# Patient Record
Sex: Female | Born: 1935
Health system: Southern US, Community
[De-identification: ages and names within clinical notes are randomized; demographics above are authoritative.]

## PROBLEM LIST (undated history)

## (undated) DIAGNOSIS — F419 Anxiety disorder, unspecified: Secondary | ICD-10-CM

## (undated) DIAGNOSIS — J449 Chronic obstructive pulmonary disease, unspecified: Secondary | ICD-10-CM

## (undated) DIAGNOSIS — R0902 Hypoxemia: Secondary | ICD-10-CM

## (undated) DIAGNOSIS — E785 Hyperlipidemia, unspecified: Secondary | ICD-10-CM

## (undated) DIAGNOSIS — M1990 Unspecified osteoarthritis, unspecified site: Secondary | ICD-10-CM

## (undated) DIAGNOSIS — I10 Essential (primary) hypertension: Secondary | ICD-10-CM

## (undated) DIAGNOSIS — T7840XA Allergy, unspecified, initial encounter: Secondary | ICD-10-CM

## (undated) HISTORY — DX: Unspecified osteoarthritis, unspecified site: M19.90

## (undated) HISTORY — DX: Anxiety disorder, unspecified: F41.9

## (undated) HISTORY — PX: SPINE SURGERY: SHX786

## (undated) HISTORY — DX: Hypoxemia: R09.02

## (undated) HISTORY — DX: Allergy, unspecified, initial encounter: T78.40XA

## (undated) HISTORY — DX: Hyperlipidemia, unspecified: E78.5

## (undated) HISTORY — DX: Essential (primary) hypertension: I10

## (undated) HISTORY — DX: Chronic obstructive pulmonary disease, unspecified: J44.9

## (undated) HISTORY — PX: ABDOMINAL HYSTERECTOMY: SHX81

---

## 2005-03-12 ENCOUNTER — Ambulatory Visit: Payer: Self-pay | Admitting: Internal Medicine

## 2005-03-23 ENCOUNTER — Ambulatory Visit: Payer: Self-pay | Admitting: Gastroenterology

## 2005-03-25 ENCOUNTER — Ambulatory Visit: Payer: Self-pay | Admitting: Internal Medicine

## 2005-03-30 ENCOUNTER — Ambulatory Visit: Payer: Self-pay | Admitting: Gastroenterology

## 2006-04-09 ENCOUNTER — Ambulatory Visit: Payer: Self-pay | Admitting: Podiatry

## 2006-07-02 ENCOUNTER — Ambulatory Visit: Payer: Self-pay | Admitting: Internal Medicine

## 2006-07-05 ENCOUNTER — Ambulatory Visit: Payer: Self-pay | Admitting: Internal Medicine

## 2006-07-08 ENCOUNTER — Ambulatory Visit: Payer: Self-pay | Admitting: Internal Medicine

## 2007-04-18 ENCOUNTER — Ambulatory Visit: Payer: Self-pay | Admitting: Pain Medicine

## 2007-04-28 ENCOUNTER — Ambulatory Visit: Payer: Self-pay | Admitting: Pain Medicine

## 2007-05-16 ENCOUNTER — Ambulatory Visit: Payer: Self-pay | Admitting: Physician Assistant

## 2007-07-12 ENCOUNTER — Ambulatory Visit: Payer: Self-pay | Admitting: Physician Assistant

## 2007-07-13 ENCOUNTER — Ambulatory Visit: Payer: Self-pay | Admitting: Internal Medicine

## 2007-09-26 ENCOUNTER — Ambulatory Visit: Payer: Self-pay | Admitting: Pain Medicine

## 2007-10-27 ENCOUNTER — Ambulatory Visit: Payer: Self-pay | Admitting: Physician Assistant

## 2007-11-24 ENCOUNTER — Ambulatory Visit: Payer: Self-pay | Admitting: Physician Assistant

## 2007-12-05 ENCOUNTER — Ambulatory Visit: Payer: Self-pay | Admitting: Physician Assistant

## 2008-01-18 ENCOUNTER — Ambulatory Visit: Payer: Self-pay | Admitting: Physician Assistant

## 2008-02-21 ENCOUNTER — Ambulatory Visit: Payer: Self-pay | Admitting: Physician Assistant

## 2008-03-21 ENCOUNTER — Ambulatory Visit: Payer: Self-pay | Admitting: Physician Assistant

## 2008-04-19 ENCOUNTER — Ambulatory Visit: Payer: Self-pay | Admitting: Physician Assistant

## 2008-07-18 ENCOUNTER — Ambulatory Visit: Payer: Self-pay | Admitting: Physician Assistant

## 2008-10-03 ENCOUNTER — Ambulatory Visit: Payer: Self-pay | Admitting: Physician Assistant

## 2008-10-11 ENCOUNTER — Ambulatory Visit: Payer: Self-pay | Admitting: Internal Medicine

## 2008-10-23 ENCOUNTER — Ambulatory Visit: Payer: Self-pay | Admitting: Pain Medicine

## 2008-11-08 ENCOUNTER — Ambulatory Visit: Payer: Self-pay | Admitting: Physician Assistant

## 2008-11-13 ENCOUNTER — Ambulatory Visit: Payer: Self-pay | Admitting: Pain Medicine

## 2008-11-27 ENCOUNTER — Ambulatory Visit: Payer: Self-pay | Admitting: Pain Medicine

## 2008-12-13 ENCOUNTER — Ambulatory Visit: Payer: Self-pay | Admitting: Physician Assistant

## 2009-03-14 ENCOUNTER — Ambulatory Visit: Payer: Self-pay | Admitting: Physician Assistant

## 2009-04-23 ENCOUNTER — Ambulatory Visit: Payer: Self-pay | Admitting: Ophthalmology

## 2009-05-07 ENCOUNTER — Ambulatory Visit: Payer: Self-pay | Admitting: Ophthalmology

## 2009-06-06 ENCOUNTER — Ambulatory Visit: Payer: Self-pay | Admitting: Physician Assistant

## 2009-09-09 ENCOUNTER — Ambulatory Visit: Payer: Self-pay | Admitting: Pain Medicine

## 2009-10-23 ENCOUNTER — Ambulatory Visit: Payer: Self-pay | Admitting: Internal Medicine

## 2010-10-27 ENCOUNTER — Ambulatory Visit: Payer: Self-pay | Admitting: Internal Medicine

## 2011-10-28 ENCOUNTER — Ambulatory Visit: Payer: Self-pay | Admitting: Internal Medicine

## 2012-10-28 ENCOUNTER — Ambulatory Visit: Payer: Self-pay | Admitting: Internal Medicine

## 2012-11-09 ENCOUNTER — Ambulatory Visit: Payer: Self-pay | Admitting: Gastroenterology

## 2012-11-11 LAB — PATHOLOGY REPORT

## 2012-11-27 ENCOUNTER — Ambulatory Visit: Payer: Self-pay | Admitting: Family Medicine

## 2012-11-27 LAB — URINALYSIS, COMPLETE
Bilirubin,UR: NEGATIVE
Ketone: NEGATIVE
Ph: 6.5 (ref 4.5–8.0)
Protein: 100
RBC,UR: >30 /HPF (ref 0–5)
Specific Gravity: 1.01 (ref 1.003–1.030)
Squamous Epithelial: NONE SEEN

## 2012-11-29 LAB — URINE CULTURE

## 2013-04-25 ENCOUNTER — Ambulatory Visit: Payer: Self-pay | Admitting: Vascular Surgery

## 2013-04-25 LAB — BASIC METABOLIC PANEL
Anion Gap: 5 — ABNORMAL LOW (ref 7–16)
BUN: 15 mg/dL (ref 7–18)
Co2: 29 mmol/L (ref 21–32)
Creatinine: 1.38 mg/dL — ABNORMAL HIGH (ref 0.60–1.30)
EGFR (Non-African Amer.): 37 — ABNORMAL LOW
Glucose: 81 mg/dL (ref 65–99)
Osmolality: 283 (ref 275–301)
Potassium: 4.7 mmol/L (ref 3.5–5.1)

## 2013-11-13 DIAGNOSIS — I10 Essential (primary) hypertension: Secondary | ICD-10-CM | POA: Insufficient documentation

## 2013-11-13 DIAGNOSIS — J449 Chronic obstructive pulmonary disease, unspecified: Secondary | ICD-10-CM | POA: Insufficient documentation

## 2013-11-13 DIAGNOSIS — E785 Hyperlipidemia, unspecified: Secondary | ICD-10-CM | POA: Insufficient documentation

## 2013-11-30 ENCOUNTER — Ambulatory Visit: Payer: Self-pay | Admitting: Internal Medicine

## 2013-12-01 ENCOUNTER — Ambulatory Visit: Payer: Self-pay | Admitting: Internal Medicine

## 2014-01-13 ENCOUNTER — Ambulatory Visit: Payer: Self-pay | Admitting: Physician Assistant

## 2014-04-28 LAB — BASIC METABOLIC PANEL
Anion Gap: 7 (ref 7–16)
BUN: 12 mg/dL (ref 7–18)
CALCIUM: 8.6 mg/dL (ref 8.5–10.1)
CHLORIDE: 107 mmol/L (ref 98–107)
CO2: 26 mmol/L (ref 21–32)
Creatinine: 1 mg/dL (ref 0.60–1.30)
EGFR (African American): >60
EGFR (Non-African Amer.): 57 — ABNORMAL LOW
Glucose: 93 mg/dL (ref 65–99)
OSMOLALITY: 279 (ref 275–301)
Potassium: 5.5 mmol/L — ABNORMAL HIGH (ref 3.5–5.1)
Sodium: 140 mmol/L (ref 136–145)

## 2014-04-28 LAB — CBC
HCT: 41.2 % (ref 35.0–47.0)
HGB: 13.4 g/dL (ref 12.0–16.0)
MCH: 31.9 pg (ref 26.0–34.0)
MCHC: 32.5 g/dL (ref 32.0–36.0)
MCV: 98 fL (ref 80–100)
Platelet: 203 10*3/uL (ref 150–440)
RBC: 4.2 10*6/uL (ref 3.80–5.20)
RDW: 13.8 % (ref 11.5–14.5)
WBC: 7 10*3/uL (ref 3.6–11.0)

## 2014-04-28 LAB — TROPONIN I

## 2014-04-29 ENCOUNTER — Inpatient Hospital Stay: Payer: Self-pay | Admitting: Internal Medicine

## 2014-04-30 LAB — BASIC METABOLIC PANEL
Anion Gap: 9 (ref 7–16)
BUN: 14 mg/dL (ref 7–18)
Calcium, Total: 8.3 mg/dL — ABNORMAL LOW (ref 8.5–10.1)
Chloride: 108 mmol/L — ABNORMAL HIGH (ref 98–107)
Co2: 26 mmol/L (ref 21–32)
Creatinine: 0.99 mg/dL (ref 0.60–1.30)
EGFR (African American): >60
EGFR (Non-African Amer.): 58 — ABNORMAL LOW
Glucose: 88 mg/dL (ref 65–99)
Osmolality: 285 (ref 275–301)
Potassium: 3.6 mmol/L (ref 3.5–5.1)
Sodium: 143 mmol/L (ref 136–145)

## 2014-04-30 LAB — TSH: Thyroid Stimulating Horm: 0.432 u[IU]/mL — ABNORMAL LOW

## 2014-05-01 LAB — CBC WITH DIFFERENTIAL/PLATELET
BASOS PCT: 0.4 %
Basophil #: 0 10*3/uL (ref 0.0–0.1)
EOS ABS: 0 10*3/uL (ref 0.0–0.7)
Eosinophil %: 0 %
HCT: 40.3 % (ref 35.0–47.0)
HGB: 13.2 g/dL (ref 12.0–16.0)
LYMPHS ABS: 1.6 10*3/uL (ref 1.0–3.6)
LYMPHS PCT: 11.9 %
MCH: 31.4 pg (ref 26.0–34.0)
MCHC: 32.8 g/dL (ref 32.0–36.0)
MCV: 96 fL (ref 80–100)
MONOS PCT: 4.2 %
Monocyte #: 0.6 x10 3/mm (ref 0.2–0.9)
NEUTROS PCT: 83.5 %
Neutrophil #: 11.2 10*3/uL — ABNORMAL HIGH (ref 1.4–6.5)
Platelet: 191 10*3/uL (ref 150–440)
RBC: 4.22 10*6/uL (ref 3.80–5.20)
RDW: 13.5 % (ref 11.5–14.5)
WBC: 13.4 10*3/uL — ABNORMAL HIGH (ref 3.6–11.0)

## 2014-05-01 LAB — BASIC METABOLIC PANEL
Anion Gap: 7 (ref 7–16)
BUN: 15 mg/dL (ref 7–18)
CHLORIDE: 108 mmol/L — AB (ref 98–107)
Calcium, Total: 9 mg/dL (ref 8.5–10.1)
Co2: 25 mmol/L (ref 21–32)
Creatinine: 1 mg/dL (ref 0.60–1.30)
GFR CALC NON AF AMER: 57 — AB
Glucose: 102 mg/dL — ABNORMAL HIGH (ref 65–99)
Osmolality: 280 (ref 275–301)
POTASSIUM: 4.2 mmol/L (ref 3.5–5.1)
Sodium: 140 mmol/L (ref 136–145)

## 2014-07-01 DIAGNOSIS — J9620 Acute and chronic respiratory failure, unspecified whether with hypoxia or hypercapnia: Secondary | ICD-10-CM | POA: Diagnosis not present

## 2014-07-01 DIAGNOSIS — J449 Chronic obstructive pulmonary disease, unspecified: Secondary | ICD-10-CM | POA: Diagnosis not present

## 2014-07-01 DIAGNOSIS — I82409 Acute embolism and thrombosis of unspecified deep veins of unspecified lower extremity: Secondary | ICD-10-CM | POA: Diagnosis not present

## 2014-07-01 DIAGNOSIS — I10 Essential (primary) hypertension: Secondary | ICD-10-CM | POA: Diagnosis not present

## 2014-07-27 DIAGNOSIS — M5416 Radiculopathy, lumbar region: Secondary | ICD-10-CM | POA: Diagnosis not present

## 2014-07-27 DIAGNOSIS — M5136 Other intervertebral disc degeneration, lumbar region: Secondary | ICD-10-CM | POA: Diagnosis not present

## 2014-08-01 DIAGNOSIS — J449 Chronic obstructive pulmonary disease, unspecified: Secondary | ICD-10-CM | POA: Diagnosis not present

## 2014-08-01 DIAGNOSIS — I10 Essential (primary) hypertension: Secondary | ICD-10-CM | POA: Diagnosis not present

## 2014-08-01 DIAGNOSIS — J9620 Acute and chronic respiratory failure, unspecified whether with hypoxia or hypercapnia: Secondary | ICD-10-CM | POA: Diagnosis not present

## 2014-08-01 DIAGNOSIS — I82409 Acute embolism and thrombosis of unspecified deep veins of unspecified lower extremity: Secondary | ICD-10-CM | POA: Diagnosis not present

## 2014-08-06 ENCOUNTER — Emergency Department: Payer: Self-pay | Admitting: Emergency Medicine

## 2014-08-06 DIAGNOSIS — Z79899 Other long term (current) drug therapy: Secondary | ICD-10-CM | POA: Diagnosis not present

## 2014-08-06 DIAGNOSIS — Z7982 Long term (current) use of aspirin: Secondary | ICD-10-CM | POA: Diagnosis not present

## 2014-08-06 DIAGNOSIS — J441 Chronic obstructive pulmonary disease with (acute) exacerbation: Secondary | ICD-10-CM | POA: Diagnosis not present

## 2014-08-06 DIAGNOSIS — N390 Urinary tract infection, site not specified: Secondary | ICD-10-CM | POA: Diagnosis not present

## 2014-08-06 DIAGNOSIS — R0602 Shortness of breath: Secondary | ICD-10-CM | POA: Diagnosis not present

## 2014-08-06 DIAGNOSIS — I10 Essential (primary) hypertension: Secondary | ICD-10-CM | POA: Diagnosis not present

## 2014-08-06 DIAGNOSIS — Z720 Tobacco use: Secondary | ICD-10-CM | POA: Diagnosis not present

## 2014-08-06 DIAGNOSIS — F17200 Nicotine dependence, unspecified, uncomplicated: Secondary | ICD-10-CM | POA: Diagnosis not present

## 2014-08-06 DIAGNOSIS — Z79891 Long term (current) use of opiate analgesic: Secondary | ICD-10-CM | POA: Diagnosis not present

## 2014-08-06 DIAGNOSIS — Z7951 Long term (current) use of inhaled steroids: Secondary | ICD-10-CM | POA: Diagnosis not present

## 2014-08-07 ENCOUNTER — Inpatient Hospital Stay: Payer: Self-pay | Admitting: Internal Medicine

## 2014-08-07 DIAGNOSIS — J9620 Acute and chronic respiratory failure, unspecified whether with hypoxia or hypercapnia: Secondary | ICD-10-CM | POA: Diagnosis not present

## 2014-08-07 DIAGNOSIS — R05 Cough: Secondary | ICD-10-CM | POA: Diagnosis not present

## 2014-08-07 DIAGNOSIS — J209 Acute bronchitis, unspecified: Secondary | ICD-10-CM | POA: Diagnosis not present

## 2014-08-07 DIAGNOSIS — Z9981 Dependence on supplemental oxygen: Secondary | ICD-10-CM | POA: Diagnosis not present

## 2014-08-07 DIAGNOSIS — Z7982 Long term (current) use of aspirin: Secondary | ICD-10-CM | POA: Diagnosis not present

## 2014-08-07 DIAGNOSIS — M5136 Other intervertebral disc degeneration, lumbar region: Secondary | ICD-10-CM | POA: Diagnosis not present

## 2014-08-07 DIAGNOSIS — J9600 Acute respiratory failure, unspecified whether with hypoxia or hypercapnia: Secondary | ICD-10-CM | POA: Diagnosis not present

## 2014-08-07 DIAGNOSIS — R531 Weakness: Secondary | ICD-10-CM | POA: Diagnosis not present

## 2014-08-07 DIAGNOSIS — J90 Pleural effusion, not elsewhere classified: Secondary | ICD-10-CM | POA: Diagnosis not present

## 2014-08-07 DIAGNOSIS — F332 Major depressive disorder, recurrent severe without psychotic features: Secondary | ICD-10-CM | POA: Diagnosis not present

## 2014-08-07 DIAGNOSIS — J449 Chronic obstructive pulmonary disease, unspecified: Secondary | ICD-10-CM | POA: Diagnosis not present

## 2014-08-07 DIAGNOSIS — J441 Chronic obstructive pulmonary disease with (acute) exacerbation: Secondary | ICD-10-CM | POA: Diagnosis not present

## 2014-08-07 DIAGNOSIS — J9610 Chronic respiratory failure, unspecified whether with hypoxia or hypercapnia: Secondary | ICD-10-CM | POA: Diagnosis not present

## 2014-08-07 DIAGNOSIS — B370 Candidal stomatitis: Secondary | ICD-10-CM | POA: Diagnosis not present

## 2014-08-07 DIAGNOSIS — I10 Essential (primary) hypertension: Secondary | ICD-10-CM | POA: Diagnosis not present

## 2014-08-07 DIAGNOSIS — F331 Major depressive disorder, recurrent, moderate: Secondary | ICD-10-CM | POA: Diagnosis not present

## 2014-08-07 DIAGNOSIS — Z7951 Long term (current) use of inhaled steroids: Secondary | ICD-10-CM | POA: Diagnosis not present

## 2014-08-07 DIAGNOSIS — R0602 Shortness of breath: Secondary | ICD-10-CM | POA: Diagnosis not present

## 2014-08-07 DIAGNOSIS — F419 Anxiety disorder, unspecified: Secondary | ICD-10-CM | POA: Diagnosis not present

## 2014-08-07 DIAGNOSIS — I739 Peripheral vascular disease, unspecified: Secondary | ICD-10-CM | POA: Diagnosis not present

## 2014-08-07 DIAGNOSIS — M6281 Muscle weakness (generalized): Secondary | ICD-10-CM | POA: Diagnosis not present

## 2014-08-14 ENCOUNTER — Encounter
Admit: 2014-08-14 | Disposition: A | Discharge status: Home / Self Care | Payer: Self-pay | Attending: Internal Medicine | Admitting: Internal Medicine

## 2014-08-17 DIAGNOSIS — R270 Ataxia, unspecified: Secondary | ICD-10-CM | POA: Diagnosis not present

## 2014-08-17 DIAGNOSIS — J449 Chronic obstructive pulmonary disease, unspecified: Secondary | ICD-10-CM | POA: Diagnosis not present

## 2014-08-17 DIAGNOSIS — Z9981 Dependence on supplemental oxygen: Secondary | ICD-10-CM | POA: Diagnosis not present

## 2014-08-17 DIAGNOSIS — R0602 Shortness of breath: Secondary | ICD-10-CM | POA: Diagnosis not present

## 2014-08-17 DIAGNOSIS — R531 Weakness: Secondary | ICD-10-CM | POA: Diagnosis not present

## 2014-08-17 DIAGNOSIS — M6281 Muscle weakness (generalized): Secondary | ICD-10-CM | POA: Diagnosis not present

## 2014-08-17 DIAGNOSIS — J441 Chronic obstructive pulmonary disease with (acute) exacerbation: Secondary | ICD-10-CM | POA: Diagnosis not present

## 2014-08-17 DIAGNOSIS — J9600 Acute respiratory failure, unspecified whether with hypoxia or hypercapnia: Secondary | ICD-10-CM | POA: Diagnosis not present

## 2014-08-17 DIAGNOSIS — N186 End stage renal disease: Secondary | ICD-10-CM | POA: Diagnosis not present

## 2014-08-17 DIAGNOSIS — F419 Anxiety disorder, unspecified: Secondary | ICD-10-CM | POA: Diagnosis not present

## 2014-08-17 DIAGNOSIS — I10 Essential (primary) hypertension: Secondary | ICD-10-CM | POA: Diagnosis not present

## 2014-08-17 DIAGNOSIS — M5136 Other intervertebral disc degeneration, lumbar region: Secondary | ICD-10-CM | POA: Diagnosis not present

## 2014-08-17 DIAGNOSIS — J9620 Acute and chronic respiratory failure, unspecified whether with hypoxia or hypercapnia: Secondary | ICD-10-CM | POA: Diagnosis not present

## 2014-08-17 DIAGNOSIS — J9610 Chronic respiratory failure, unspecified whether with hypoxia or hypercapnia: Secondary | ICD-10-CM | POA: Diagnosis not present

## 2014-08-17 DIAGNOSIS — B370 Candidal stomatitis: Secondary | ICD-10-CM | POA: Diagnosis not present

## 2014-08-17 DIAGNOSIS — I82409 Acute embolism and thrombosis of unspecified deep veins of unspecified lower extremity: Secondary | ICD-10-CM | POA: Diagnosis not present

## 2014-08-17 DIAGNOSIS — I739 Peripheral vascular disease, unspecified: Secondary | ICD-10-CM | POA: Diagnosis not present

## 2014-08-17 DIAGNOSIS — R339 Retention of urine, unspecified: Secondary | ICD-10-CM | POA: Diagnosis not present

## 2014-08-20 DIAGNOSIS — J441 Chronic obstructive pulmonary disease with (acute) exacerbation: Secondary | ICD-10-CM | POA: Diagnosis not present

## 2014-08-20 DIAGNOSIS — R270 Ataxia, unspecified: Secondary | ICD-10-CM | POA: Diagnosis not present

## 2014-08-20 DIAGNOSIS — N186 End stage renal disease: Secondary | ICD-10-CM | POA: Diagnosis not present

## 2014-08-20 DIAGNOSIS — J449 Chronic obstructive pulmonary disease, unspecified: Secondary | ICD-10-CM | POA: Diagnosis not present

## 2014-08-20 DIAGNOSIS — R339 Retention of urine, unspecified: Secondary | ICD-10-CM | POA: Diagnosis not present

## 2014-08-20 DIAGNOSIS — I10 Essential (primary) hypertension: Secondary | ICD-10-CM | POA: Diagnosis not present

## 2014-09-07 ENCOUNTER — Encounter
Admit: 2014-09-07 | Disposition: A | Discharge status: Home / Self Care | Payer: Self-pay | Attending: Internal Medicine | Admitting: Internal Medicine

## 2014-09-08 LAB — URINALYSIS, COMPLETE
Bacteria: NONE SEEN
Bilirubin,UR: NEGATIVE
Blood: NEGATIVE
Glucose,UR: NEGATIVE mg/dL (ref 0–75)
NITRITE: NEGATIVE
Ph: 6 (ref 4.5–8.0)
Specific Gravity: 1.026 (ref 1.003–1.030)
Squamous Epithelial: 1
WBC UR: 1108 /HPF (ref 0–5)

## 2014-09-11 LAB — URINE CULTURE

## 2014-09-25 DIAGNOSIS — I10 Essential (primary) hypertension: Secondary | ICD-10-CM | POA: Diagnosis not present

## 2014-09-25 DIAGNOSIS — Z09 Encounter for follow-up examination after completed treatment for conditions other than malignant neoplasm: Secondary | ICD-10-CM | POA: Diagnosis not present

## 2014-09-25 DIAGNOSIS — M5442 Lumbago with sciatica, left side: Secondary | ICD-10-CM | POA: Diagnosis not present

## 2014-09-25 DIAGNOSIS — F419 Anxiety disorder, unspecified: Secondary | ICD-10-CM | POA: Insufficient documentation

## 2014-09-25 DIAGNOSIS — J449 Chronic obstructive pulmonary disease, unspecified: Secondary | ICD-10-CM | POA: Diagnosis not present

## 2014-09-27 DIAGNOSIS — Z87891 Personal history of nicotine dependence: Secondary | ICD-10-CM | POA: Diagnosis not present

## 2014-09-27 DIAGNOSIS — Z9981 Dependence on supplemental oxygen: Secondary | ICD-10-CM | POA: Diagnosis not present

## 2014-09-27 DIAGNOSIS — M5136 Other intervertebral disc degeneration, lumbar region: Secondary | ICD-10-CM | POA: Diagnosis not present

## 2014-09-27 DIAGNOSIS — J441 Chronic obstructive pulmonary disease with (acute) exacerbation: Secondary | ICD-10-CM | POA: Diagnosis not present

## 2014-09-27 DIAGNOSIS — I739 Peripheral vascular disease, unspecified: Secondary | ICD-10-CM | POA: Diagnosis not present

## 2014-09-27 DIAGNOSIS — I10 Essential (primary) hypertension: Secondary | ICD-10-CM | POA: Diagnosis not present

## 2014-09-27 DIAGNOSIS — M4726 Other spondylosis with radiculopathy, lumbar region: Secondary | ICD-10-CM | POA: Diagnosis not present

## 2014-09-28 NOTE — Op Note (Signed)
PATIENT NAME:  Bethany [name], Bethany [name] MR#:  [id number] DATE OF BIRTH:  June 03, 1936  DATE OF PROCEDURE:  04/25/2013  PREOPERATIVE DIAGNOSIS: Atherosclerotic occlusive disease bilateral lower extremities, with rest pain bilateral lower extremities.   POSTOPERATIVE DIAGNOSIS: Atherosclerotic occlusive disease bilateral lower extremities, with rest pain bilateral lower extremities.   PROCEDURES PERFORMED: 1.  Abdominal aortogram.  2.  Pelvic angiography, second-order catheter placement.  3.  Percutaneous transluminal angioplasty of the left external iliac artery to 6 mm.  4.  Percutaneous transluminal angioplasty and stent placement of the left common iliac artery to 7 mm.  5.  Percutaneous transluminal angioplasty and stent placement of the right external iliac artery.   PROCEDURE PERFORMED BY: Renford Dills, MD   SEDATION: Precedex drip with IV fentanyl supplementation. Continuous ECG, pulse oximetry and cardiopulmonary monitoring was performed throughout the entire procedure by the interventional radiology nurse. Total sedation time was 2 hours.   ACCESS: 6-French sheath, right common femoral artery.   FLUOROSCOPY TIME: 12.7 minutes.   CONTRAST USED: 98 mL Isovue.   INDICATIONS: Ms. Paro is a 79 year old woman who presents to the office with increasing pain and inability to walk within her own home secondary to the pain in her legs. The risks and benefits for angiography and intervention were reviewed. All questions were answered. The patient has agreed to proceed.   DESCRIPTION OF PROCEDURE: The patient is taken to special procedures and placed in a supine position. After adequate sedation is achieved, both groins are prepped and draped in sterile fashion. Ultrasound is placed in a sterile sleeve. Ultrasound is utilized secondary to lack of appropriate landmarks and to avoid vascular injury. Under direct ultrasound visualization, common femoral artery is identified. It is echolucent and  pulsatile, indicating patency. In its mid and distal portions, there is a heavy burden of plaque, particularly in the posterior and medial walls, and this does appear to create a significant luminal narrowing. However, more proximally, there does appear to be an area with minimal posterior plaque, which is echolucent and pulsatile, indicating patency. This is the area selected for access. Lidocaine 1% is infiltrated, and a micropuncture needle is inserted, microwire followed by micro sheath. I was unable to get the J-wire to advance into the aorta under fluoroscopy, and so hand injection of contrast through the micro sheath demonstrated a greater than 80% narrowing of the external iliac artery on the right. Ultimately, using this angiography, a floppy Glidewire is negotiated into the common iliac and subsequently the aorta. A 5-French sheath and 5-French pigtail catheter are then advanced. The pigtail catheter is positioned at the level of T12, and AP projection of the aorta is obtained. The pigtail catheter is repositioned to above the bifurcation, and bilateral oblique views of the pelvis are obtained. Given the findings of the high-grade stenosis on the right external iliac, as well as an occlusion of the common iliac on the left and diffuse greater than 70% narrowing of the external iliac on the left, 4000 units of heparin was given. RIM catheter was advanced through the sheath, and the floppy Glidewire and RIM catheter were used to cross the aortic bifurcation. The catheter was advanced down into the distal external iliac and common femoral, and hand injection of contrast was utilized to demonstrate the common femoral artery on the left as well as the femoral bifurcation. A Magic Torque wire was then advanced down into the profunda femoris to allow for better purchase, and an Ansel sheath was advanced up and over  the bifurcation and positioned with its tip approximately 2 cm into the left common iliac artery.    A 6 x 4 balloon was then advanced over the wire, and beginning at the level of the circumflex vessels, serial inflations were performed to 10 atmospheres for 30 seconds. As the balloon was advanced into the distal common iliac artery, greater inflation was performed to higher atmospheres and longer time. Followup imaging demonstrated the external iliac looked quite good, but the common iliac still had a dissected plane with inadequate result and therefore, an 8 x 60 LifeStar stent was deployed. It was postdilated with a 7 balloon, and this was performed within the common iliac on the left. Followup angiography in 2 views demonstrates excellent result with rapid flow of contrast now into the common femoral on the left. Distal outflow is unchanged.   The sheath is then pulled into the right iliac system. Initially, a view of the common iliac demonstrated haziness. The 7 x 4 balloon was used to inflate in this area, but there was never any waist. I do not believe this is an angioplasty and have not identified it as such.   The external iliac is treated with a 6 balloon. This results in a significant dissection and subsequently, a 7 x 40 LifeStar stent is deployed from just below the iliac bifurcation, extending distally in the external. It is posted with a 7 balloon. However, there remained significant haziness at the bifurcation itself, and a 7 x 19 Omnilink stent is deployed, extending the stent approximately 2 mm above the bifurcation of the external and internal iliacs. The 7 balloon is used to re-dilate the midportion of the LifeStar stent as well. Followup angiography done now demonstrates an excellent result with rapid flow of contrast through the right external iliac system and no significant residual stenosis.   Oblique view of the right groin is obtained, and a StarClose device is deployed with excellent result. There are no immediate complications.   INTERPRETATION: The initial views demonstrate  high-grade stricture in the external iliac. Once this has been negotiated, the abdominal aorta is opacified. It demonstrates diffuse disease, but there are no hemodynamically significant lesions noted in the aorta. On the left, there is occlusion of the distal common iliac and diffuse disease throughout the external iliac. There is external iliac disease on the right as noted above. Bilateral common femoral arteries demonstrate diffuse disease, although in imaging, there does not appear to be a circumferential high-grade stricture. It should be noted that the duplex ultrasound did demonstrate velocity shifts in both common femorals. The profunda femoris arteries are patent bilaterally, as are the visualized portions of the SFA, although there is heavy disease within the SFA.   Following angioplasty on the left of the external and subsequently angioplasty and stenting of the common, there is resolution of the stenoses on the left. Following angioplasty and stent placement on the right, there is resolution of this lesion as well.   SUMMARY: Successful recanalization of the aortoiliac system bilaterally in a patient who continues to have significant disease throughout their lower extremities bilaterally, including common femoral and superficial femoral disease. The patient will follow up with me in the office to obtain ABIs and ascertain how much improvement she has had from this procedure thus far, and further treatment options will be based on this result.  ____________________________ Renford Dills, MD ggs:jcm D: 04/25/2013 17:25:15 ET T: 04/25/2013 18:07:53 ET JOB#: [id number]  cc: Renford Dills, MD, <Dictator>  Barbette ReichmannVishwanath Hande, MD Renford DillsGREGORY G SCHNIER MD ELECTRONICALLY SIGNED 05/15/2013 17:16

## 2014-09-29 NOTE — H&P (Signed)
PATIENT NAME:  Bethany [name], Bethany [name] MR#:  [id number] DATE OF BIRTH:  November 16, 1935  DATE OF ADMISSION:  04/29/2014  REFERRING PHYSICIAN: Lurena Joiner L. Shaune Pollack, MD  PRIMARY CARE PHYSICIAN: Barbette Reichmann, MD   ADMISSION DIAGNOSIS: Chronic obstructive pulmonary disease exacerbation.   HISTORY OF PRESENT ILLNESS: This is a 79 year old Caucasian female who presents to the Emergency Department with cough for 3 days and a tight feeling in her chest. The patient states that she had some difficulty taking deep breaths, but denies shortness of breath, lightheadedness, or dizziness. She denies fevers, but admits to some general fatigue. In the Emergency Department, the patient was found to be hypoxic, but without infiltrates on her chest x-ray, and a clinical picture consistent with COPD exacerbation. The Emergency Department staff was trying to discharge the patient home, but found that her oxygen saturations were persistently around 85%, which prompted them to call for admission.   REVIEW OF SYSTEMS:  CONSTITUTIONAL: The patient denies fever, but admits to fatigue.  EYES: Denies blurred vision or inflammation.  EARS, NOSE, AND THROAT: Denies tinnitus or difficulty swallowing.  RESPIRATORY: Admits to cough and some shortness of breath with exercise.  CARDIOVASCULAR: Denies chest pain, palpitations, orthopnea, or paroxysmal nocturnal dyspnea.  GASTROINTESTINAL: Denies nausea, vomiting, abdominal pain, or diarrhea.  GENITOURINARY: Denies dysuria, increased frequency, or hesitancy of urination.  ENDOCRINE: Denies polyuria or polydipsia.  HEMATOLOGIC AND LYMPHATIC: Admits to easy bruising, but denies bleeding.  INTEGUMENTARY: Denies rashes or lesions.  MUSCULOSKELETAL: Denies myalgias or arthralgias.  NEUROLOGIC: Denies numbness in her extremities or dysarthria.  PSYCHIATRIC: Denies depression or suicidal ideation.   PAST MEDICAL HISTORY: COPD, hypertension, chronic back pain, and peripheral vascular disease.    PAST SURGICAL HISTORY: The patient has had 8 back surgeries, a hysterectomy, and iliac and aortic stent placement.   SOCIAL HISTORY: The patient still smokes cigarettes sometimes. She has been trying to quit. She denies alcohol or illicit drug use. The patient lives alone and is very active.   FAMILY HISTORY: The patient's father is deceased of ALS. Coronary artery disease is also in  some of the men in her family.   MEDICATIONS:  1.  Acetaminophen with hydrocodone 325 mg/5 mg tablet 1 tab p.o. every 6 hours as needed for pain.  2.  Advair 100 mcg/50 mcg inhaled powder 1 inhalation 2 times a day.  3.  Albuterol 90 mcg/inhalation 2 puffs every 4-6 hours as needed for coughing, wheezing, or shortness of breath.  4.  Amlodipine 5 mg 1 tablet p.o. daily.  5.  Azithromycin 250 mg tablets 1 tab p.o. x 4 days.  6.  Gabapentin 300 mg 1 capsule p.o. t.i.d.  7.  Lisinopril 20 mg 1 tab p.o. daily.  8.  Lovastatin 20 mg 1 tab p.o. at bedtime.  9.  Magnesium oxide 500 mg 1 tab p.o. daily.  10.  Metoprolol succinate extended release 100 mg 1 tablet p.o. daily.  11.  Prednisone 20 mg 2 tablets p.o. x 5 days.   ALLERGIES: SULFA DRUGS.   PERTINENT LABORATORY RESULTS AND RADIOGRAPHIC FINDINGS: Serum glucose is 93, BUN 12, creatinine is 1, serum sodium 140, potassium is 5.5, chloride is 107, bicarbonate is 26, calcium is 8.6. Troponin is negative. White blood cell count is 7, hemoglobin 13.4, hematocrit 41.2. Chest x-ray shows chronic obstructive pulmonary disease, aortic atherosclerosis, and chronic-appearing focal kyphosis near the thoracolumbar junction. There are no acute cardiopulmonary findings.   PHYSICAL EXAMINATION:  VITAL SIGNS: Temperature is 98, pulse 72, respirations 20,  blood pressure is 157/77, pulse oximetry was 85% off of oxygen and increased to 96% on 2 liters of oxygen via nasal cannula.  GENERAL: The patient is alert and oriented x 3, in no apparent distress.  HEENT: Normocephalic,  atraumatic. Pupils equal, round, and reactive to light and accommodation. Extraocular movements are intact. Mucous membranes are moist.  NECK: Trachea is midline. No adenopathy.  CHEST: Symmetric, atraumatic.  CARDIOVASCULAR: Regular rate and rhythm. Normal S1, S2. No rubs, clicks, or murmurs appreciated.  LUNGS: Faint wheezing in upper lung fields. There are no rales or rhonchi. The patient has no use of accessory muscles.  ABDOMEN: Positive bowel sounds. Soft, nontender, nondistended. No hepatosplenomegaly.  GENITOURINARY: Deferred.  MUSCULOSKELETAL: The patient moves all 4 extremities equally. There is 5/5 strength in upper and lower extremities bilaterally.  SKIN: No rashes or lesions.  EXTREMITIES: No clubbing, cyanosis, or edema.  NEUROLOGIC: Cranial nerves II through XII are grossly intact.  PSYCHIATRIC: Mood is normal. Affect is congruent.   ASSESSMENT AND PLAN: This is a 79 year old female admitted for chronic obstructive pulmonary disease exacerbation.  1.  Acute on chronic respiratory failure with hypoxemia secondary to chronic obstructive pulmonary disease. The patient's oxygen saturations on room air prior to discharge were 85%, which prompted admission. The patient is still coughing some, but much more comfortable. I have increased her Advair dose and I have added Spiriva her inhaler regimen. We will continue albuterol while the patient is awake and complete a course of azithromycin. I have lengthened  the patient's steroid taper and she will get supplemental humidified oxygen via nasal cannula to maintain oxygen saturations between 88% and 92%.  2.  Hypertension, currently uncontrolled, but likely elevated due to cough and overall stress. Continue metoprolol, lisinopril, and amlodipine per home regimen. Monitor for improvement. We may need to increase the patient's angiotensin-converting enzyme inhibitor or calcium channel blocker.  3.  Peripheral vascular disease. I will start the  patient on aspirin. Her vascular surgeon had discontinued Plavix due to easy bruising.  4.  Chronic back pain. We will continue the patient's oral narcotics per her home regimen.  5.  Tobacco abuse. The patient smokes very little. I have explained to her that smoking cessation halts the deterioration of chronic obstructive pulmonary disease. While in the hospital, I will start the patient on a low-dose Nicoderm patch.  6.  Deep vein thrombosis prophylaxis: Heparin.  7.  Gastrointestinal prophylaxis: Unnecessary as the patient is not critically ill.  8.  The patient is a FULL CODE.  TIME SPENT ON ADMISSION ORDERS AND PATIENT CARE: Approximately 40 minutes.    ____________________________ Kelton PillarMichael S. Sheryle Hailiamond, MD msd:ts D: 04/29/2014 01:59:00 ET T: 04/29/2014 02:11:39 ET JOB#: [id number]  cc: Kelton PillarMichael S. Sheryle Hailiamond, MD, <Dictator> Kelton PillarMICHAEL S Yailine Ballard MD ELECTRONICALLY SIGNED 05/11/2014 1:26

## 2014-09-29 NOTE — Discharge Summary (Signed)
PATIENT NAME:  Bethany [name] MR#:  [id number] DATE OF BIRTH:  07-25-1935  DATE OF ADMISSION:  04/29/2014 DATE OF DISCHARGE:  05/01/2014  DIAGNOSES AT TIME OF DISCHARGE:  1.  Acute on chronic respiratory failure secondary to chronic obstructive pulmonary disease exacerbation.  2.  Hypertension.  3.  Peripheral arterial disease.   4.  Chronic tobacco use.  5.  Chronic back pain.  6.  Hyperlipidemia.   CHIEF COMPLAINT:  Shortness of breath, fatigue, hypoxemic.   HISTORY OF PRESENT ILLNESS: Bethany [name] is a 79 year old female who presented the ED complaining of cough with productive sputum associated with tightness in her chest, difficulty in taking deep breaths, and also has been experiencing a generalized sense of fatigue. The patient was noted to be hypoxemic with O2 saturation of 85% on admission.   PAST MEDICAL HISTORY: Significant for COPD, hypertension, chronic back pain, PAD, please see H and P for other details. The patient [facility].   LABORATORY DATA: Chest x-ray showed evidence of aortic atherosclerosis and focal kyphosis near the thoracolumbar junction, no cardiopulmonary findings.  Sugar was 93, BUN 12, creatinine 1, serum sodium 140, potassium 5.5, chloride 107, bicarbonate 26. WBC count 7, hemoglobin 13.4.   HOSPITAL COURSE: The [facility] was [facility] and received nebulized bronchodilator therapy as well as intravenous steroids. She made good progress. She was placed on supplemental oxygen 2 liters nasal cannula and she was also started on azithromycin that was continued as an outpatient. She was ambulated and discharged in stable condition on the following medications.    DISCHARGE MEDICATIONS: Azithromycin 250 mg p.o. daily for 4 days, prednisone taper 30 mg a day for 3 days, decreased x 10 mg every 3 days until gone, albuterol inhaler 2 puffs q.i.d. p.r.n., Spiriva capsule once a day, Advair Diskus 250/50 one puff b.i.d., amlodipine 5 mg once a day, hydrocodone  acetaminophen 325 mg q. 6 p.r.n.,  gabapentin 300 mg t.i.d., lisinopril 20 mg once a day, metoprolol 100 mg once a day, lovastatin 20 mg once a day, and oxygen 2 liters nasal cannula. The patient was advised a low-sodium diet and advised to follow with me, Dr. Marcello FennelHande, in 1-2 weeks time.   TOTAL TIME SPENT IN DISCHARGING THE PATIENT:  35 minutes.    ____________________________ Barbette ReichmannVishwanath Jacari Iannello, MD vh:bu D: 05/04/2014 13:23:59 ET T: 05/04/2014 14:42:37 ET JOB#: [id number]  cc: Barbette ReichmannVishwanath Luzelena Heeg, MD, <Dictator> Barbette ReichmannVISHWANATH Jusitn Salsgiver MD ELECTRONICALLY SIGNED 05/08/2014 17:49

## 2014-09-30 DIAGNOSIS — I82409 Acute embolism and thrombosis of unspecified deep veins of unspecified lower extremity: Secondary | ICD-10-CM | POA: Diagnosis not present

## 2014-09-30 DIAGNOSIS — J9620 Acute and chronic respiratory failure, unspecified whether with hypoxia or hypercapnia: Secondary | ICD-10-CM | POA: Diagnosis not present

## 2014-09-30 DIAGNOSIS — J449 Chronic obstructive pulmonary disease, unspecified: Secondary | ICD-10-CM | POA: Diagnosis not present

## 2014-09-30 DIAGNOSIS — I10 Essential (primary) hypertension: Secondary | ICD-10-CM | POA: Diagnosis not present

## 2014-10-02 DIAGNOSIS — M5136 Other intervertebral disc degeneration, lumbar region: Secondary | ICD-10-CM | POA: Diagnosis not present

## 2014-10-02 DIAGNOSIS — Z9981 Dependence on supplemental oxygen: Secondary | ICD-10-CM | POA: Diagnosis not present

## 2014-10-02 DIAGNOSIS — I739 Peripheral vascular disease, unspecified: Secondary | ICD-10-CM | POA: Diagnosis not present

## 2014-10-02 DIAGNOSIS — M4726 Other spondylosis with radiculopathy, lumbar region: Secondary | ICD-10-CM | POA: Diagnosis not present

## 2014-10-02 DIAGNOSIS — I10 Essential (primary) hypertension: Secondary | ICD-10-CM | POA: Diagnosis not present

## 2014-10-02 DIAGNOSIS — J441 Chronic obstructive pulmonary disease with (acute) exacerbation: Secondary | ICD-10-CM | POA: Diagnosis not present

## 2014-10-02 DIAGNOSIS — Z87891 Personal history of nicotine dependence: Secondary | ICD-10-CM | POA: Diagnosis not present

## 2014-10-04 DIAGNOSIS — I10 Essential (primary) hypertension: Secondary | ICD-10-CM | POA: Diagnosis not present

## 2014-10-04 DIAGNOSIS — M4726 Other spondylosis with radiculopathy, lumbar region: Secondary | ICD-10-CM | POA: Diagnosis not present

## 2014-10-04 DIAGNOSIS — I739 Peripheral vascular disease, unspecified: Secondary | ICD-10-CM | POA: Diagnosis not present

## 2014-10-04 DIAGNOSIS — Z87891 Personal history of nicotine dependence: Secondary | ICD-10-CM | POA: Diagnosis not present

## 2014-10-04 DIAGNOSIS — Z9981 Dependence on supplemental oxygen: Secondary | ICD-10-CM | POA: Diagnosis not present

## 2014-10-04 DIAGNOSIS — J441 Chronic obstructive pulmonary disease with (acute) exacerbation: Secondary | ICD-10-CM | POA: Diagnosis not present

## 2014-10-04 DIAGNOSIS — M5136 Other intervertebral disc degeneration, lumbar region: Secondary | ICD-10-CM | POA: Diagnosis not present

## 2014-10-07 NOTE — Consult Note (Signed)
Psychiatry: Follow-up for this patient who appears to be acutely depressed.  On interview today she tells me she is feeling much better.  She tells me she feels optimistic now.  She mentions that she intends to cooperate with physical therapy in order to regain her strength.  She tells me that [place] to [place]. new complaints.  Mood is better.  Physically feeling stronger although still not hungry. appears to be better groomed and more awake and alert.  Good eye contact.  Normal psychomotor activity.  Speech is more complete and easier to understand.  Affect more reactive and brighter.  No sign of delusions no suicidal ideation. appears to be doing much better.  Tolerating medication.  Slept well last night.  I will continue with the Lexapro and the Valium at night and avoid much other sedation in the day. Major depression recurrent moderate  Electronic Signatures: Clapacs, Jackquline DenmarkJohn T (MD)  (Signed on 09-Mar-16 18:12)  Authored  Last Updated: 09-Mar-16 18:12 by Audery Amellapacs, John T (MD)

## 2014-10-07 NOTE — H&P (Signed)
PATIENT NAME:  Bethany [name], Bethany [name] MR#:  [id number] DATE OF BIRTH:  Oct 16, 1935  DATE OF ADMISSION:  08/07/2014  REFERRING PHYSICIAN:  Sheryl L. Mindi JunkerGottlieb, MD   PRIMARY CARE PHYSICIAN:  Barbette ReichmannVishwanath Hande, [facility].   CHIEF COMPLAINT:  Shortness of breath.   HISTORY OF PRESENT ILLNESS:  This is a 79 year old Caucasian female with a history of COPD and chronic respiratory failure on 2 liters via nasal cannula at baseline, as well as hypertension, essential, presenting with shortness of breath described as 3-day duration of shortness of breath as well as dyspnea on exertion and associated cough productive of yellow sputum. Denies fevers or chills. She was actually [facility] and diagnosed with COPD exacerbation. She received steroids and was discharged, however, returns one day later with worsening symptoms. Denies any further symptomatology other than stated above. Upon arrival, she was started on BiPAP therapy given tachypnea and respiratory distress.   REVIEW OF SYSTEMS:   CONSTITUTIONAL:  Denies fevers, chills, fatigue, or weakness.  EYES:  Denies blurred vision, double vision, or eye pain. EARS, NOSE, AND THROAT:  Denies tinnitus, ear pain, or hearing loss. RESPIRATORY:  Positive for cough and shortness of breath as stated above. CARDIOVASCULAR:  Denies chest pain, palpitations, or edema.  GASTROINTESTINAL:  Denies nausea, vomiting, diarrhea, or abdominal pain. GENITOURINARY:  Denies dysuria or hematuria. ENDOCRINE:  Denies nocturia or thyroid problems. HEMATOLOGIC AND LYMPHATIC:  Denies easy bruising or bleeding. SKIN:  No rash or lesion. MUSCULOSKELETAL:  Denies pain in the neck, back, shoulders, knees, or hips or other arthritic symptoms. NEUROLOGIC:  Denies paralysis or paresthesias. PSYCHIATRIC:  Denies anxiety or depressive symptoms.   Otherwise, a full review of systems performed by me is negative.    PAST MEDICAL HISTORY:  Includes  COPD and chronic respiratory failure on 2 liters via nasal cannula at baseline, essential hypertension, and peripheral arterial disease.  SOCIAL HISTORY:  Rule out tobacco use. Denies any alcohol or drug use.   FAMILY HISTORY:  no known pulmonary or coronary artery disease.   ALLERGIES:  SULFA DRUGS.  HOME MEDICATIONS:  Include acetaminophen/hydrocodone 325/5 mg p.o. 5 times a day as needed for pain, aspirin 81 mg p.o. daily, lisinopril 20 mg p.o. daily, diazepam 5 mg p.o. at bedtime, gabapentin 300 mg p.o. 3 times daily, lovastatin 20 mg p.o. at bedtime, metoprolol 100 mg p.o. daily, Advair 250/50 mcg inhalation b.i.d., albuterol 90 mcg inhalation 4 puffs every 4 hours as needed for shortness of breath, Spiriva 18 mcg inhalation daily, Norvasc 5 mg p.o. daily.   PHYSICAL EXAMINATION: VITAL SIGNS:  Temperature 98.4, heart rate 104, respirations 24, blood pressure 140/75, saturating 94% on supplemental O2, weight 58.1 kg, BMI 20.7.  GENERAL:  Well-nourished, well-developed, Caucasian female currently in minimal distress given respiratory status.  HEAD:  Normocephalic, atraumatic.  EYES:  Pupils are equal, round, and reactive to light. Extraocular muscles are intact. No scleral icterus.  MOUTH:  Moist mucous membranes. Dentition is intact. No abscess noted.  EARS, NOSE, AND THROAT:  Clear without exudates. No external lesions.  NECK:  Supple. No thyromegaly. No nodules. No JVD.  PULMONARY:  Diffuse expiratory wheezing throughout all lung fields without rhonchi or rales. Tachypneic without use of accessory muscles. Good respiratory effort.  CHEST:  Nontender to palpation.  CARDIOVASCULAR:  S1 and S2, regular rate and rhythm. No murmurs, rubs, or gallops. No edema. Pedal pulses 2+ bilaterally.  GASTROINTESTINAL:  Soft, nontender, nondistended. No masses. Positive bowel sounds.  No hepatosplenomegaly.  MUSCULOSKELETAL:  No swelling, clubbing, or edema. Range of motion is full in all extremities.   NEUROLOGIC:  Cranial nerves II through XII are intact. No gross focal neurological deficits. Sensation is intact. Reflexes are intact.  SKIN:  No ulceration, lesions, rash, or cyanosis. Skin is warm and dry. Turgor is intact. PSYCHIATRIC:  Mood and affect are within normal limits. The patient is alert and oriented x 3. Insight and judgment are intact.   LABORATORY DATA:  ABG performed at 3 liters via nasal cannula showed pH of 7.35, CO2 of 44, O2 of 70, and bicarbonate of 24.3. Chest x-ray shows no acute cardiopulmonary process. Sodium is 139, potassium 4.3, chloride 107, bicarbonate 22, BUN 14, creatinine 0.99, glucose 117. Troponin is less than 0.02. WBC is 16.1, hemoglobin 12.9, platelets 229,000.   ASSESSMENT AND PLAN:  This is a 79 year old Caucasian female with a history of chronic obstructive pulmonary disease and chronic respiratory failure on 2 liters via nasal cannula at baseline, presenting with shortness of breath.   1.  Chronic obstructive pulmonary disease exacerbation. Provide supplemental O2 and keep SaO2 greater than 88%, DuoNeb treatments q. 4 hours, Solumedrol 60 mg IV daily, as well as azithromycin for its anti-inflammatory effects. Continue with her home dosage of Advair and Spiriva.  2.  Hypertension, essential. Continue with Norvasc and metoprolol.  3.  Vein thromboembolism prophylaxis with heparin subcutaneously.   CODE STATUS:  The patient is a full code.   TIME SPENT:  45 minutes.    ____________________________ Cletis Athens. Yasmene Salomone, MD dkh:nb D: 08/07/2014 22:39:44 ET T: 08/07/2014 23:09:05 ET JOB#: [id number]  cc: Cletis Athens. Raimundo Corbit, MD, <Dictator> Zael Shuman Synetta Shadow MD ELECTRONICALLY SIGNED 08/15/2014 20:44

## 2014-10-07 NOTE — Discharge Summary (Signed)
PATIENT NAME:  Bethany [name], Bethany [name] MR#:  [id number] DATE OF BIRTH:  [birth year]  DATE OF ADMISSION:  08/07/2014 DATE OF DISCHARGE:  08/17/2014  DISCHARGE DIAGNOSES: 1.  Acute on chronic respiratory failure secondary to chronic obstructive pulmonary disease exacerbation.  2.  Hypertension.  3.  Anxiety.   CHIEF COMPLAINT: Shortness of breath.   HISTORY OF PRESENT ILLNESS: Bethany SmilesShirley [name] is a 79 year old female with a history of COPD, chronic respiratory failure, on 2 liters nasal cannula, history of hypertension, presented to the ED complaining of shortness of breath associated with cough, productive yellow sputum. The patient recently had been treated for similar symptoms with steroids but returns with worsening symptoms.   Past medical history is significant for COPD, chronic respiratory failure, essential hypertension, peripheral vascular disease. She is an ex-smoker. Please see H and P for full details.   HOSPITAL COURSE: The patient was [facility] and received nebulized bronchodilator therapy as well as oxygen and IV antibiotics. ABG performed with 3 liters nasal cannula showed a pH of 7.35, pCO2 of 44, O2 of 70 and bicarb of 24. Chest x-ray did not show any acute cardiopulmonary disease. CT of the chest showed COPD changes with tiny nonspecific right lung nodules, about 3 mm in diameter. She was advised to have a repeat CT scan in 6 months. There was no evidence of infiltrate or pleural effusion or pneumothorax. There was evidence of previous caudal thoracic spine fracture, subluxation or effusion and degenerative disk disease in the upper lumbar spine. During her stay in the hospital, the patient continued to have episodes of anxiety and she was seen by psychiatrist, Dr. Toni Amendlapacs, and also started on Lexapro. In addition, she needed Xanax p.r.n. and also Valium at bedtime. Her breathing gradually improved and she was advised rehab and she was transferred in stable condition on the following  medications.  DISCHARGE MEDICATIONS: Gabapentin 300 mg p.o. t.i.d., ProAir HFA 2 puffs 4 times a day as needed, amlodipine 5 mg once a day, Advair Diskus 250/50 one puff b.i.d., lovastatin 20 mg once a day, metoprolol succinate 100 mg once a day, Spiriva 18 mcg 1 capsule once a day, aspirin 81 mg a day, diazepam 5 mg once a day, baclofen 10 mg p.o. t.i.d., albuterol nebs q. 2 to 4 hours p.r.n., lisinopril 40 mg once a day, prednisone taper starting at 40 mg a day for three days, decrease x 10 mg every three days until gone, hydrocodone/acetaminophen 5/325 one tablet p.o. b.i.d. p.r.n., Levaquin 250 mg p.o. daily for 7 days, escitalopram 10 mg once a day, nystatin oral suspension 5 mL q. 6 hours for 10 days, docusate sodium 100 mg b.i.d., guaifenesin oral liquid 5 mL q. 12 hours p.r.n. for cough.   DISCHARGE INSTRUCTIONS: The patient was advised home oxygen at 3 to 4 liters nasal cannula and wean down to 2 liters as tolerated. The patient has been advised to follow up with me, Dr. Marcello FennelHande, in 2 to 3 weeks' time.   Total time spen tin discharge of this patient: 35 minutes ____________________________ Barbette ReichmannVishwanath Quintina Hakeem, MD vh:sb D: 08/17/2014 13:03:46 ET T: 08/17/2014 13:13:53 ET JOB#: [id number]  cc: Barbette ReichmannVishwanath Kenzly Rogoff, MD, <Dictator> Barbette ReichmannVISHWANATH Lashan Macias MD ELECTRONICALLY SIGNED 09/03/2014 18:08

## 2014-10-07 NOTE — Consult Note (Signed)
PATIENT NAME:  Bethany [name], Bethany [name] MR#:  [id number] DATE OF BIRTH:  [birth year]  DATE OF CONSULTATION:  08/14/2014  CONSULTING PHYSICIAN:  Audery Amel, MD  IDENTIFYING INFORMATION AND REASON FOR CONSULTATION: This is a 79 year old woman who was admitted for a COPD exacerbation. Consult for anxiety.   HISTORY OF PRESENT ILLNESS: Information from the patient and the chart. The patient states that she feels like she does not know how she can go on in her current situation. When I asked her what the current situation was she said that it was having COPD.  She seems to feel like the COPD is a brand-new onset thing. In any case she is expressing that she feels much sicker now and like there has been a dramatic change in her health and her ability to do things since coming into the hospital. The patient says that she had been pretty much in her normal state of health until just a couple of weeks ago. Then quite quickly she had an increase in her shortness of breath that required hospitalization. Now she reports that her mood is feeling bad. She describes herself as being useless and having no hope for the future. She has no appetite. She still sleeps okay in the hospital. She no longer has much that she is looking forward to in the future. She says she is having thoughts about wishing that she could just give up and die although she does not think that she would ever try and kill herself. Her reason for doing this is just because she does not want to harm her brother. In addition to her illness, however, it is clear that a major stress for her is that her closest friend recently required hospitalization and now has been placed in an assisted living facility. She says that she used to care for this woman and visit with her every day. It sounds like her emotional life very much revolved around this friend and that the two of them had plans to vacation together in the future. Now that woman evidently is no longer living  nearby. The patient misses her terribly and feels like she does not really have anything to live for. She is not reporting any psychotic symptoms. The patient is not getting any psychiatric treatment other than a small dose of benzodiazepines at night for sleep.   PAST PSYCHIATRIC HISTORY:  At first she denied it, but then she recalled that she actually had tried to kill herself once in the past. She says that this was a long time ago, probably decades ago before the hospital was at its current location. She was psychiatrically hospitalized for about a week. After thinking for a minute she remembers that incident was triggered also by a close friend of hers having moved away.   SOCIAL HISTORY: The patient lives by herself. She has never married and has no children. Her closest relative is her brother, although she says that her brother's wife does not care for her very much. By far her closest emotional relationship sounds like it was with her good friend who lived across the street from her. The patient is a retired Airline pilot. Had been living independently before hospitalization.   PAST MEDICAL HISTORY: The patient has a long-standing diagnosis of COPD, although she does not seem to recognize it as being such. She was supposed to be using home oxygen before coming into the hospital, but clearly was not doing it as much as she was supposed to. She  was supposed to be using inhalers, but never thought that they were particularly important before.   SUBSTANCE ABUSE HISTORY:  She says that there was a time many years ago when she drank too much, but then she gave it up. She denies other drug abuse.   FAMILY HISTORY: History of two uncles who are alcoholics.   CURRENT MEDICATIONS: Norvasc 5 mg in the morning, aspirin 81 mg in the morning, baclofen 10 mg three times a day, Advair Diskus 1 puff twice a day, gabapentin 300 mg three times a day, lovastatin 20 mg at night, metoprolol 100 mg in the morning,  Spiriva HandiHaler 1 capsule daily, Docusate 100 mg twice a day, nitroglycerin ointment topically twice a day methylprednisolone still given IV right now. Zosyn antibiotic IV q. 8 hours, Levaquin 250 mg oral daily, lisinopril 40 mg daily.  She was also taking Xanax in the hospital three times a day as needed for anxiety.   ALLERGIES: SULFA DRUGS.   REVIEW OF SYSTEMS: Complains of feeling tired. No appetite. No motivation, no energy, feeling hopeless. She denies hallucinations.   MENTAL STATUS EXAMINATION:  A 79 year old woman who looks her stated age, cooperative with the interview. Good eye contact. Psychomotor activity slow. Speech slow, but easy to understand and answers questions completely. Affect is flat. Mood is stated as being not good. Thoughts are lucid. No evidence of loosening of associations or delusions. Denies auditory or visual hallucinations. Has hopeless thoughts about wishing she were dead, but denies any active suicidal ideation. No homicidal ideation. The patient is alert and oriented x4. Repeats three words readily, but cannot remember any of them at three minutes. Judgment and insight little bit impaired. Intelligence appears to be normal.   LABORATORY RESULTS: Multiple labs done since admission. Currently glucose is stable, creatinine stable, not anemic. White count of 11.9.   VITAL SIGNS: Blood pressure 129/71, respirations 19, pulse 92, and temperature 98.   ASSESSMENT: This is a 79 year old woman who I think is having a major depression triggered by grieving over the loss of her friend. I do not think she represents an acute suicide risk, but I do think that her depression is putting her at risk of not being able to cooperate with what she needs to do for recovery.   TREATMENT PLAN: The patient was educated about the importance of positive thinking, motivation and participation in getting her strength back. I told her that I thought that she could recover back to her previous  level of function, but she had to be planning for the future and working at it. I think she should be started on an antidepressant. I have started her on Lexapro 10 mg a day and she is agreeable to that.  I have discontinued the Xanax during the day. I would be concerned that this patient is at high risk of getting dependent on Xanax, which could just make her sedated and less likely to be motivated enough to take care of herself. I will reinstate her Valium 5 mg at night, which has been a long-standing outpatient medicine. We will follow as needed.   DIAGNOSIS, PRINCIPAL AND PRIMARY:  AXIS I: Major depression, single, moderate.   SECONDARY DIAGNOSES:  AXIS I: No further diagnosis.  AXIS II: No diagnosis.  AXIS III: Chronic obstructive pulmonary disease, high blood pressure.    ____________________________ Audery AmelJohn T. Kynleigh Artz, MD jtc:at D: 08/14/2014 16:40:36 ET T: 08/14/2014 17:59:27 ET JOB#: [id number]  cc: Audery AmelJohn T. Marlis Oldaker, MD, <Dictator> Jackquline DenmarkJOHN T Minda Faas  MD ELECTRONICALLY SIGNED 08/15/2014 23:45

## 2014-10-07 NOTE — Consult Note (Signed)
Psychiatry: Follow-up for patient with depression.  Today she tells me she is feeling worse.  Feeling sick her today.  More nervous.  Says she is been having diarrhea and has been sick to her stomach. some more GI complaints today.  Denies suicidal ideation.  Not feeling depressed. mental status patient appears anxious and more dysphoric and tired than yesterday.  Denies suicidal thoughts denies hallucinations.  She is alert and oriented 4 and cooperative with good judgment and insight. feeling a little bit worse today but depression continues to be gradually improving.  No change to psychiatric medicine.  Continue to follow as needed. major depression  Electronic Signatures: Clapacs, Jackquline DenmarkJohn T (MD)  (Signed on 10-Mar-16 19:24)  Authored  Last Updated: 10-Mar-16 19:24 by Audery Amellapacs, John T (MD)

## 2014-10-08 DIAGNOSIS — J441 Chronic obstructive pulmonary disease with (acute) exacerbation: Secondary | ICD-10-CM | POA: Diagnosis not present

## 2014-10-08 DIAGNOSIS — I10 Essential (primary) hypertension: Secondary | ICD-10-CM | POA: Diagnosis not present

## 2014-10-08 DIAGNOSIS — M5136 Other intervertebral disc degeneration, lumbar region: Secondary | ICD-10-CM | POA: Diagnosis not present

## 2014-10-08 DIAGNOSIS — M4726 Other spondylosis with radiculopathy, lumbar region: Secondary | ICD-10-CM | POA: Diagnosis not present

## 2014-10-08 DIAGNOSIS — I739 Peripheral vascular disease, unspecified: Secondary | ICD-10-CM | POA: Diagnosis not present

## 2014-10-08 DIAGNOSIS — Z87891 Personal history of nicotine dependence: Secondary | ICD-10-CM | POA: Diagnosis not present

## 2014-10-08 DIAGNOSIS — Z9981 Dependence on supplemental oxygen: Secondary | ICD-10-CM | POA: Diagnosis not present

## 2014-10-09 DIAGNOSIS — I10 Essential (primary) hypertension: Secondary | ICD-10-CM | POA: Diagnosis not present

## 2014-10-09 DIAGNOSIS — M4726 Other spondylosis with radiculopathy, lumbar region: Secondary | ICD-10-CM | POA: Diagnosis not present

## 2014-10-09 DIAGNOSIS — M5136 Other intervertebral disc degeneration, lumbar region: Secondary | ICD-10-CM | POA: Diagnosis not present

## 2014-10-09 DIAGNOSIS — J441 Chronic obstructive pulmonary disease with (acute) exacerbation: Secondary | ICD-10-CM | POA: Diagnosis not present

## 2014-10-10 DIAGNOSIS — Z87891 Personal history of nicotine dependence: Secondary | ICD-10-CM | POA: Diagnosis not present

## 2014-10-10 DIAGNOSIS — I10 Essential (primary) hypertension: Secondary | ICD-10-CM | POA: Diagnosis not present

## 2014-10-10 DIAGNOSIS — M4726 Other spondylosis with radiculopathy, lumbar region: Secondary | ICD-10-CM | POA: Diagnosis not present

## 2014-10-10 DIAGNOSIS — J441 Chronic obstructive pulmonary disease with (acute) exacerbation: Secondary | ICD-10-CM | POA: Diagnosis not present

## 2014-10-10 DIAGNOSIS — Z9981 Dependence on supplemental oxygen: Secondary | ICD-10-CM | POA: Diagnosis not present

## 2014-10-10 DIAGNOSIS — M5136 Other intervertebral disc degeneration, lumbar region: Secondary | ICD-10-CM | POA: Diagnosis not present

## 2014-10-10 DIAGNOSIS — I739 Peripheral vascular disease, unspecified: Secondary | ICD-10-CM | POA: Diagnosis not present

## 2014-10-15 DIAGNOSIS — M5136 Other intervertebral disc degeneration, lumbar region: Secondary | ICD-10-CM | POA: Diagnosis not present

## 2014-10-15 DIAGNOSIS — J441 Chronic obstructive pulmonary disease with (acute) exacerbation: Secondary | ICD-10-CM | POA: Diagnosis not present

## 2014-10-15 DIAGNOSIS — M4726 Other spondylosis with radiculopathy, lumbar region: Secondary | ICD-10-CM | POA: Diagnosis not present

## 2014-10-15 DIAGNOSIS — I10 Essential (primary) hypertension: Secondary | ICD-10-CM | POA: Diagnosis not present

## 2014-10-15 DIAGNOSIS — I739 Peripheral vascular disease, unspecified: Secondary | ICD-10-CM | POA: Diagnosis not present

## 2014-10-15 DIAGNOSIS — Z9981 Dependence on supplemental oxygen: Secondary | ICD-10-CM | POA: Diagnosis not present

## 2014-10-15 DIAGNOSIS — Z87891 Personal history of nicotine dependence: Secondary | ICD-10-CM | POA: Diagnosis not present

## 2014-10-16 DIAGNOSIS — M5416 Radiculopathy, lumbar region: Secondary | ICD-10-CM | POA: Diagnosis not present

## 2014-10-19 DIAGNOSIS — J441 Chronic obstructive pulmonary disease with (acute) exacerbation: Secondary | ICD-10-CM | POA: Diagnosis not present

## 2014-10-19 DIAGNOSIS — F419 Anxiety disorder, unspecified: Secondary | ICD-10-CM | POA: Diagnosis not present

## 2014-10-19 DIAGNOSIS — J9610 Chronic respiratory failure, unspecified whether with hypoxia or hypercapnia: Secondary | ICD-10-CM | POA: Diagnosis not present

## 2014-10-19 DIAGNOSIS — M5136 Other intervertebral disc degeneration, lumbar region: Secondary | ICD-10-CM | POA: Diagnosis not present

## 2014-10-23 DIAGNOSIS — I10 Essential (primary) hypertension: Secondary | ICD-10-CM | POA: Diagnosis not present

## 2014-10-23 DIAGNOSIS — M5136 Other intervertebral disc degeneration, lumbar region: Secondary | ICD-10-CM | POA: Diagnosis not present

## 2014-10-23 DIAGNOSIS — Z9981 Dependence on supplemental oxygen: Secondary | ICD-10-CM | POA: Diagnosis not present

## 2014-10-23 DIAGNOSIS — Z87891 Personal history of nicotine dependence: Secondary | ICD-10-CM | POA: Diagnosis not present

## 2014-10-23 DIAGNOSIS — M4726 Other spondylosis with radiculopathy, lumbar region: Secondary | ICD-10-CM | POA: Diagnosis not present

## 2014-10-23 DIAGNOSIS — I739 Peripheral vascular disease, unspecified: Secondary | ICD-10-CM | POA: Diagnosis not present

## 2014-10-23 DIAGNOSIS — J441 Chronic obstructive pulmonary disease with (acute) exacerbation: Secondary | ICD-10-CM | POA: Diagnosis not present

## 2014-10-30 DIAGNOSIS — J9620 Acute and chronic respiratory failure, unspecified whether with hypoxia or hypercapnia: Secondary | ICD-10-CM | POA: Diagnosis not present

## 2014-10-30 DIAGNOSIS — I10 Essential (primary) hypertension: Secondary | ICD-10-CM | POA: Diagnosis not present

## 2014-10-30 DIAGNOSIS — J449 Chronic obstructive pulmonary disease, unspecified: Secondary | ICD-10-CM | POA: Diagnosis not present

## 2014-10-30 DIAGNOSIS — I82409 Acute embolism and thrombosis of unspecified deep veins of unspecified lower extremity: Secondary | ICD-10-CM | POA: Diagnosis not present

## 2014-11-08 DIAGNOSIS — J449 Chronic obstructive pulmonary disease, unspecified: Secondary | ICD-10-CM | POA: Diagnosis not present

## 2014-11-08 DIAGNOSIS — I10 Essential (primary) hypertension: Secondary | ICD-10-CM | POA: Diagnosis not present

## 2014-11-08 DIAGNOSIS — K219 Gastro-esophageal reflux disease without esophagitis: Secondary | ICD-10-CM | POA: Diagnosis not present

## 2014-11-08 DIAGNOSIS — I739 Peripheral vascular disease, unspecified: Secondary | ICD-10-CM | POA: Diagnosis not present

## 2014-11-15 ENCOUNTER — Other Ambulatory Visit: Payer: Self-pay | Admitting: Internal Medicine

## 2014-11-15 DIAGNOSIS — Z0000 Encounter for general adult medical examination without abnormal findings: Secondary | ICD-10-CM | POA: Diagnosis not present

## 2014-11-15 DIAGNOSIS — J449 Chronic obstructive pulmonary disease, unspecified: Secondary | ICD-10-CM | POA: Diagnosis not present

## 2014-11-15 DIAGNOSIS — Z1231 Encounter for screening mammogram for malignant neoplasm of breast: Secondary | ICD-10-CM

## 2014-11-15 DIAGNOSIS — I10 Essential (primary) hypertension: Secondary | ICD-10-CM | POA: Diagnosis not present

## 2014-11-15 DIAGNOSIS — M150 Primary generalized (osteo)arthritis: Secondary | ICD-10-CM | POA: Diagnosis not present

## 2014-11-30 DIAGNOSIS — J9620 Acute and chronic respiratory failure, unspecified whether with hypoxia or hypercapnia: Secondary | ICD-10-CM | POA: Diagnosis not present

## 2014-11-30 DIAGNOSIS — I10 Essential (primary) hypertension: Secondary | ICD-10-CM | POA: Diagnosis not present

## 2014-11-30 DIAGNOSIS — I82409 Acute embolism and thrombosis of unspecified deep veins of unspecified lower extremity: Secondary | ICD-10-CM | POA: Diagnosis not present

## 2014-11-30 DIAGNOSIS — J449 Chronic obstructive pulmonary disease, unspecified: Secondary | ICD-10-CM | POA: Diagnosis not present

## 2014-12-04 ENCOUNTER — Other Ambulatory Visit: Payer: Self-pay | Admitting: Internal Medicine

## 2014-12-04 ENCOUNTER — Ambulatory Visit
Admission: RE | Admit: 2014-12-04 | Discharge: 2014-12-04 | Disposition: A | Discharge status: Home / Self Care | Payer: Commercial Managed Care - HMO | Source: Ambulatory Visit | Attending: Internal Medicine | Admitting: Internal Medicine

## 2014-12-04 DIAGNOSIS — Z1231 Encounter for screening mammogram for malignant neoplasm of breast: Secondary | ICD-10-CM

## 2014-12-27 DIAGNOSIS — I10 Essential (primary) hypertension: Secondary | ICD-10-CM | POA: Diagnosis not present

## 2014-12-27 DIAGNOSIS — I739 Peripheral vascular disease, unspecified: Secondary | ICD-10-CM | POA: Diagnosis not present

## 2014-12-27 DIAGNOSIS — I6529 Occlusion and stenosis of unspecified carotid artery: Secondary | ICD-10-CM | POA: Diagnosis not present

## 2014-12-27 DIAGNOSIS — J449 Chronic obstructive pulmonary disease, unspecified: Secondary | ICD-10-CM | POA: Diagnosis not present

## 2014-12-27 DIAGNOSIS — I70213 Atherosclerosis of native arteries of extremities with intermittent claudication, bilateral legs: Secondary | ICD-10-CM | POA: Diagnosis not present

## 2014-12-27 DIAGNOSIS — E785 Hyperlipidemia, unspecified: Secondary | ICD-10-CM | POA: Diagnosis not present

## 2014-12-30 DIAGNOSIS — J449 Chronic obstructive pulmonary disease, unspecified: Secondary | ICD-10-CM | POA: Diagnosis not present

## 2014-12-30 DIAGNOSIS — J9620 Acute and chronic respiratory failure, unspecified whether with hypoxia or hypercapnia: Secondary | ICD-10-CM | POA: Diagnosis not present

## 2014-12-30 DIAGNOSIS — I82409 Acute embolism and thrombosis of unspecified deep veins of unspecified lower extremity: Secondary | ICD-10-CM | POA: Diagnosis not present

## 2014-12-30 DIAGNOSIS — I10 Essential (primary) hypertension: Secondary | ICD-10-CM | POA: Diagnosis not present

## 2015-01-09 DIAGNOSIS — M3501 Sicca syndrome with keratoconjunctivitis: Secondary | ICD-10-CM | POA: Diagnosis not present

## 2015-01-30 DIAGNOSIS — I10 Essential (primary) hypertension: Secondary | ICD-10-CM | POA: Diagnosis not present

## 2015-01-30 DIAGNOSIS — J9620 Acute and chronic respiratory failure, unspecified whether with hypoxia or hypercapnia: Secondary | ICD-10-CM | POA: Diagnosis not present

## 2015-01-30 DIAGNOSIS — I82409 Acute embolism and thrombosis of unspecified deep veins of unspecified lower extremity: Secondary | ICD-10-CM | POA: Diagnosis not present

## 2015-01-30 DIAGNOSIS — J449 Chronic obstructive pulmonary disease, unspecified: Secondary | ICD-10-CM | POA: Diagnosis not present

## 2015-02-14 DIAGNOSIS — M5136 Other intervertebral disc degeneration, lumbar region: Secondary | ICD-10-CM | POA: Diagnosis not present

## 2015-02-14 DIAGNOSIS — M5416 Radiculopathy, lumbar region: Secondary | ICD-10-CM | POA: Diagnosis not present

## 2015-02-14 DIAGNOSIS — M4726 Other spondylosis with radiculopathy, lumbar region: Secondary | ICD-10-CM | POA: Diagnosis not present

## 2015-03-02 DIAGNOSIS — I10 Essential (primary) hypertension: Secondary | ICD-10-CM | POA: Diagnosis not present

## 2015-03-02 DIAGNOSIS — J449 Chronic obstructive pulmonary disease, unspecified: Secondary | ICD-10-CM | POA: Diagnosis not present

## 2015-03-02 DIAGNOSIS — J9620 Acute and chronic respiratory failure, unspecified whether with hypoxia or hypercapnia: Secondary | ICD-10-CM | POA: Diagnosis not present

## 2015-03-02 DIAGNOSIS — I82409 Acute embolism and thrombosis of unspecified deep veins of unspecified lower extremity: Secondary | ICD-10-CM | POA: Diagnosis not present

## 2015-03-12 DIAGNOSIS — Z0000 Encounter for general adult medical examination without abnormal findings: Secondary | ICD-10-CM | POA: Diagnosis not present

## 2015-03-12 DIAGNOSIS — J449 Chronic obstructive pulmonary disease, unspecified: Secondary | ICD-10-CM | POA: Diagnosis not present

## 2015-03-12 DIAGNOSIS — G8929 Other chronic pain: Secondary | ICD-10-CM | POA: Diagnosis not present

## 2015-03-12 DIAGNOSIS — I10 Essential (primary) hypertension: Secondary | ICD-10-CM | POA: Diagnosis not present

## 2015-03-12 DIAGNOSIS — D649 Anemia, unspecified: Secondary | ICD-10-CM | POA: Diagnosis not present

## 2015-03-12 DIAGNOSIS — M545 Low back pain: Secondary | ICD-10-CM | POA: Diagnosis not present

## 2015-03-12 DIAGNOSIS — M150 Primary generalized (osteo)arthritis: Secondary | ICD-10-CM | POA: Diagnosis not present

## 2015-03-12 DIAGNOSIS — E538 Deficiency of other specified B group vitamins: Secondary | ICD-10-CM | POA: Diagnosis not present

## 2015-03-21 DIAGNOSIS — M159 Polyosteoarthritis, unspecified: Secondary | ICD-10-CM | POA: Diagnosis not present

## 2015-03-21 DIAGNOSIS — K219 Gastro-esophageal reflux disease without esophagitis: Secondary | ICD-10-CM | POA: Diagnosis not present

## 2015-03-21 DIAGNOSIS — J432 Centrilobular emphysema: Secondary | ICD-10-CM | POA: Diagnosis not present

## 2015-03-21 DIAGNOSIS — E538 Deficiency of other specified B group vitamins: Secondary | ICD-10-CM | POA: Diagnosis not present

## 2015-03-21 DIAGNOSIS — Z23 Encounter for immunization: Secondary | ICD-10-CM | POA: Diagnosis not present

## 2015-03-21 DIAGNOSIS — R635 Abnormal weight gain: Secondary | ICD-10-CM | POA: Diagnosis not present

## 2015-03-21 DIAGNOSIS — I10 Essential (primary) hypertension: Secondary | ICD-10-CM | POA: Diagnosis not present

## 2015-03-21 DIAGNOSIS — M5416 Radiculopathy, lumbar region: Secondary | ICD-10-CM | POA: Diagnosis not present

## 2015-04-01 DIAGNOSIS — J9620 Acute and chronic respiratory failure, unspecified whether with hypoxia or hypercapnia: Secondary | ICD-10-CM | POA: Diagnosis not present

## 2015-04-01 DIAGNOSIS — J449 Chronic obstructive pulmonary disease, unspecified: Secondary | ICD-10-CM | POA: Diagnosis not present

## 2015-04-01 DIAGNOSIS — I10 Essential (primary) hypertension: Secondary | ICD-10-CM | POA: Diagnosis not present

## 2015-04-01 DIAGNOSIS — I82409 Acute embolism and thrombosis of unspecified deep veins of unspecified lower extremity: Secondary | ICD-10-CM | POA: Diagnosis not present

## 2015-05-02 DIAGNOSIS — J9620 Acute and chronic respiratory failure, unspecified whether with hypoxia or hypercapnia: Secondary | ICD-10-CM | POA: Diagnosis not present

## 2015-05-02 DIAGNOSIS — I10 Essential (primary) hypertension: Secondary | ICD-10-CM | POA: Diagnosis not present

## 2015-05-02 DIAGNOSIS — I82409 Acute embolism and thrombosis of unspecified deep veins of unspecified lower extremity: Secondary | ICD-10-CM | POA: Diagnosis not present

## 2015-05-02 DIAGNOSIS — J449 Chronic obstructive pulmonary disease, unspecified: Secondary | ICD-10-CM | POA: Diagnosis not present

## 2015-06-01 DIAGNOSIS — I82409 Acute embolism and thrombosis of unspecified deep veins of unspecified lower extremity: Secondary | ICD-10-CM | POA: Diagnosis not present

## 2015-06-01 DIAGNOSIS — I10 Essential (primary) hypertension: Secondary | ICD-10-CM | POA: Diagnosis not present

## 2015-06-01 DIAGNOSIS — J9620 Acute and chronic respiratory failure, unspecified whether with hypoxia or hypercapnia: Secondary | ICD-10-CM | POA: Diagnosis not present

## 2015-06-01 DIAGNOSIS — J449 Chronic obstructive pulmonary disease, unspecified: Secondary | ICD-10-CM | POA: Diagnosis not present

## 2015-06-13 DIAGNOSIS — M5136 Other intervertebral disc degeneration, lumbar region: Secondary | ICD-10-CM | POA: Diagnosis not present

## 2015-06-13 DIAGNOSIS — M5416 Radiculopathy, lumbar region: Secondary | ICD-10-CM | POA: Diagnosis not present

## 2015-06-13 DIAGNOSIS — M4726 Other spondylosis with radiculopathy, lumbar region: Secondary | ICD-10-CM | POA: Diagnosis not present

## 2015-06-13 DIAGNOSIS — M47816 Spondylosis without myelopathy or radiculopathy, lumbar region: Secondary | ICD-10-CM | POA: Insufficient documentation

## 2015-06-25 DIAGNOSIS — M5416 Radiculopathy, lumbar region: Secondary | ICD-10-CM | POA: Diagnosis not present

## 2015-06-25 DIAGNOSIS — M5136 Other intervertebral disc degeneration, lumbar region: Secondary | ICD-10-CM | POA: Diagnosis not present

## 2015-07-02 DIAGNOSIS — I10 Essential (primary) hypertension: Secondary | ICD-10-CM | POA: Diagnosis not present

## 2015-07-02 DIAGNOSIS — J9620 Acute and chronic respiratory failure, unspecified whether with hypoxia or hypercapnia: Secondary | ICD-10-CM | POA: Diagnosis not present

## 2015-07-02 DIAGNOSIS — I82409 Acute embolism and thrombosis of unspecified deep veins of unspecified lower extremity: Secondary | ICD-10-CM | POA: Diagnosis not present

## 2015-07-02 DIAGNOSIS — J449 Chronic obstructive pulmonary disease, unspecified: Secondary | ICD-10-CM | POA: Diagnosis not present

## 2015-07-17 DIAGNOSIS — Z23 Encounter for immunization: Secondary | ICD-10-CM | POA: Diagnosis not present

## 2015-07-17 DIAGNOSIS — R635 Abnormal weight gain: Secondary | ICD-10-CM | POA: Diagnosis not present

## 2015-07-17 DIAGNOSIS — J432 Centrilobular emphysema: Secondary | ICD-10-CM | POA: Diagnosis not present

## 2015-07-17 DIAGNOSIS — I10 Essential (primary) hypertension: Secondary | ICD-10-CM | POA: Diagnosis not present

## 2015-07-17 DIAGNOSIS — K219 Gastro-esophageal reflux disease without esophagitis: Secondary | ICD-10-CM | POA: Diagnosis not present

## 2015-07-17 DIAGNOSIS — M5416 Radiculopathy, lumbar region: Secondary | ICD-10-CM | POA: Diagnosis not present

## 2015-07-25 DIAGNOSIS — E538 Deficiency of other specified B group vitamins: Secondary | ICD-10-CM | POA: Diagnosis not present

## 2015-07-25 DIAGNOSIS — I739 Peripheral vascular disease, unspecified: Secondary | ICD-10-CM | POA: Diagnosis not present

## 2015-07-25 DIAGNOSIS — J432 Centrilobular emphysema: Secondary | ICD-10-CM | POA: Diagnosis not present

## 2015-07-25 DIAGNOSIS — M159 Polyosteoarthritis, unspecified: Secondary | ICD-10-CM | POA: Diagnosis not present

## 2015-07-25 DIAGNOSIS — M5416 Radiculopathy, lumbar region: Secondary | ICD-10-CM | POA: Diagnosis not present

## 2015-07-25 DIAGNOSIS — I10 Essential (primary) hypertension: Secondary | ICD-10-CM | POA: Diagnosis not present

## 2015-07-25 DIAGNOSIS — K219 Gastro-esophageal reflux disease without esophagitis: Secondary | ICD-10-CM | POA: Diagnosis not present

## 2015-07-25 DIAGNOSIS — F419 Anxiety disorder, unspecified: Secondary | ICD-10-CM | POA: Diagnosis not present

## 2015-08-02 DIAGNOSIS — I82409 Acute embolism and thrombosis of unspecified deep veins of unspecified lower extremity: Secondary | ICD-10-CM | POA: Diagnosis not present

## 2015-08-02 DIAGNOSIS — J9620 Acute and chronic respiratory failure, unspecified whether with hypoxia or hypercapnia: Secondary | ICD-10-CM | POA: Diagnosis not present

## 2015-08-02 DIAGNOSIS — I10 Essential (primary) hypertension: Secondary | ICD-10-CM | POA: Diagnosis not present

## 2015-08-02 DIAGNOSIS — J449 Chronic obstructive pulmonary disease, unspecified: Secondary | ICD-10-CM | POA: Diagnosis not present

## 2015-08-30 DIAGNOSIS — J449 Chronic obstructive pulmonary disease, unspecified: Secondary | ICD-10-CM | POA: Diagnosis not present

## 2015-08-30 DIAGNOSIS — M1711 Unilateral primary osteoarthritis, right knee: Secondary | ICD-10-CM | POA: Diagnosis not present

## 2015-08-30 DIAGNOSIS — M4726 Other spondylosis with radiculopathy, lumbar region: Secondary | ICD-10-CM | POA: Diagnosis not present

## 2015-08-30 DIAGNOSIS — J9620 Acute and chronic respiratory failure, unspecified whether with hypoxia or hypercapnia: Secondary | ICD-10-CM | POA: Diagnosis not present

## 2015-08-30 DIAGNOSIS — I82409 Acute embolism and thrombosis of unspecified deep veins of unspecified lower extremity: Secondary | ICD-10-CM | POA: Diagnosis not present

## 2015-08-30 DIAGNOSIS — M5136 Other intervertebral disc degeneration, lumbar region: Secondary | ICD-10-CM | POA: Diagnosis not present

## 2015-08-30 DIAGNOSIS — I10 Essential (primary) hypertension: Secondary | ICD-10-CM | POA: Diagnosis not present

## 2015-08-30 DIAGNOSIS — M5416 Radiculopathy, lumbar region: Secondary | ICD-10-CM | POA: Diagnosis not present

## 2015-09-02 ENCOUNTER — Other Ambulatory Visit: Payer: Self-pay | Admitting: *Deleted

## 2015-09-02 DIAGNOSIS — J441 Chronic obstructive pulmonary disease with (acute) exacerbation: Secondary | ICD-10-CM

## 2015-09-02 NOTE — Patient Outreach (Signed)
[facility]) Care Management  09/02/2015  Bethany [name] 02/14/1936 [id number]  Subjective:  Telephone call to patient's home number, spoke with patient, and HIPAA verified.   Discussed [facility] Care [facility] services and patient in agreement to receive services.  Patient states she is doing well but takes a long time to do things.    [facility] to speak with brother Bethany [name](Bethany [name]) regarding healthcare needs as needed. Patient states she is currently using 3 liters of home oxygen continuously for COPD.   Patient also has rolling walker, rollator, and straight cane.  Patient states she also has a history of hypertension.   Patient states she has not been in the hospital or ED over the last 30 days.   Patient states her last hospitalization was 08/07/14 -08/17/14 for COPD and hypertension.  States she also spent approximately  4 [facility] [place] [place]  hospital[place].  Patient states she is taking her medications as prescribed.  Patient states she is in need of additional COPD disease education, disease monitoring, home safety evaluation, and community resources for COPD management.   States she is aware of COPD exacerbation signs and symptoms to notify primary MD.  States he also has back problems that interfere with her mobility, in addition to the COPD.   States she saw her back MD, Dr. Delfino [name] on 08/30/15.  States she is scheduled to receive her next back steroid  Injection on 09/18/15.   Patient states her primary MD is Dr. Barbette ReichmannVishwanath [name] and next appointment is 11/22/15.  Patient has requested to receive services in person versus telephonically initially because she stated she learns better in person.   Patient in agreement to a referral to [facility] RNCM for COPD disease education, disease monitoring, home safety evaluation, and community resources for COPD management.  RNCM gave patient RNCM's contact number,  [facility] Main phone number, and 24 hour Nurse Advice line phone number for future reference.   Objective:  Per Epic case review: Patient hospitalized  08/07/14 -08/17/14 for COPD exacerbation and hypertenison.    Assessment: Received [facility] referral on 08/29/15.   Referral source: Marciano SequinLaSandra Keen.    Referral reason: [facility] referral from [facility], consider home health.   Patient is unaware of symptoms to watch for and when to call MD.   Patient reports she has only seen primary MD.    Patient would like education on when to call doctor, reports things are harder than when she got home from the hospital.    Diagnosis: COPD.     Telephone screen completed and patient will receive [facility] Care [facility] services.   Plan: RNCM will refer patient to [facility] RNCM for COPD disease education, disease monitoring, home safety evaluation, and community resources for COPD management. RNCM will send request to Sherle PoeNicole Robinson at [facility] Care Management to add patient's primary MD Dr. Barbette ReichmannVishwanath [name] to patient's Epic profile.    Rinaldo Macqueen H. Gardiner Barefootooper RN, BSN, CCM [facility] Care Management [facility] Telephonic CM Phone: [phone] Fax: [phone]

## 2015-09-06 ENCOUNTER — Encounter: Payer: Self-pay | Admitting: *Deleted

## 2015-09-06 ENCOUNTER — Other Ambulatory Visit: Payer: Self-pay | Admitting: *Deleted

## 2015-09-06 NOTE — Patient Outreach (Signed)
[facility]) Care Management  09/06/2015  Bethany [name] 02/23/1936 [id number]   Initial telephone call to patient.  Referral received from Telephonic care manager for community care manager to consult for home visit COPD disease education, disease monitoring, home safety evaluation, and community resources for COPD management.  RNCM placed telephone call to [name] to arrange initial home visit, HIPAA verified and patient gave verbal agreement to discuss. [name] reports that she has all of her prescribed medications and taking them as prescribed, patient report wearing her oxygen at 2 liters and using her inhaler as prescribed. Patient denies having increased shortness of breath or sputum production.  Patient denies any new concerns or problems at this time, provided my contact information , and made sure patient had 24 nurse line number, encouraged patient to call MD for worsen COPD, and 911 for emergency.  Plan Home visited arranged April 7 at 2 pm.  Egbert GaribaldiKimberly Glover, RN, [facility] Care Management [phone]- Mobile [phone]- Toll Free Main Office

## 2015-09-13 ENCOUNTER — Encounter: Payer: Self-pay | Admitting: *Deleted

## 2015-09-13 ENCOUNTER — Other Ambulatory Visit: Payer: Self-pay | Admitting: *Deleted

## 2015-09-13 DIAGNOSIS — G8929 Other chronic pain: Secondary | ICD-10-CM | POA: Insufficient documentation

## 2015-09-13 DIAGNOSIS — M549 Dorsalgia, unspecified: Secondary | ICD-10-CM

## 2015-09-13 NOTE — Patient Outreach (Signed)
[facility]) Care Management   09/14/2015  Bethany [name] 10/10/1935 [id number]  Bethany [name] is an 80 y.o. female  Subjective: " I am having a good day, breathing is good"  Objective:  BP 150/80 mmHg  Pulse 60  Resp 20  SpO2 96% Review of Systems  Constitutional: Negative.   HENT: Negative.   Eyes: Negative.   Respiratory: Negative.   Cardiovascular: Negative.  Negative for leg swelling.  Gastrointestinal: Negative.   Genitourinary: Negative.   Musculoskeletal: Positive for back pain. Negative for falls.  Skin: Negative.   Neurological: Negative.   Endo/Heme/Allergies: Negative.   Psychiatric/Behavioral: Negative.     Physical Exam  Constitutional: She is oriented to person, place, and time. She appears well-developed and well-nourished.  Cardiovascular: Normal rate and normal heart sounds.   Respiratory: Effort normal and breath sounds normal.  GI: Soft.  Neurological: She is alert and oriented to person, place, and time.  Skin: Skin is warm and dry.  Psychiatric: She has a normal mood and affect. Her behavior is normal. Judgment and thought content normal.    Encounter Medications:   Outpatient Encounter Prescriptions as of 09/13/2015  Medication Sig  . albuterol (PROVENTIL HFA;VENTOLIN HFA) 108 (90 Base) MCG/ACT inhaler Inhale 2 puffs into the lungs every 6 (six) hours as needed for wheezing or shortness of breath.  Marland Kitchen. aspirin (ASPIRIN EC) 81 MG EC tablet Take 81 mg by mouth daily. Swallow whole.  . baclofen (LIORESAL) 10 MG tablet Take 10 mg by mouth 3 (three) times daily.  . cetirizine (ZYRTEC) 10 MG tablet Take 10 mg by mouth daily.  . Cyanocobalamin (VITAMIN B 12 PO) Take 1,000 mcg by mouth daily.  . diazepam (VALIUM) 5 MG tablet Take 5 mg by mouth every 6 (six) hours as needed for anxiety.  . docusate sodium (COLACE) 100 MG capsule Take 100 mg by mouth 2 (two) times daily as needed for mild constipation.  Marland Kitchen. escitalopram (LEXAPRO) 5 MG tablet  Take 5 mg by mouth daily.  . ferrous sulfate 325 (65 FE) MG tablet Take 325 mg by mouth daily with breakfast.  . Fluticasone-Salmeterol (ADVAIR) 250-50 MCG/DOSE AEPB Inhale 1 puff into the lungs 2 (two) times daily.  Marland Kitchen. gabapentin (NEURONTIN) 300 MG capsule Take 300 mg by mouth 3 (three) times daily.  Marland Kitchen. HYDROcodone-acetaminophen (NORCO/VICODIN) 5-325 MG tablet Take 1 tablet by mouth 5 (five) times daily as needed for moderate pain. Do not exceed 5 times daily, patient at least 3 day  . lovastatin (MEVACOR) 20 MG tablet Take 20 mg by mouth at bedtime.  . metoprolol succinate (TOPROL-XL) 100 MG 24 hr tablet Take 100 mg by mouth daily. Take with or immediately following a meal.  . montelukast (SINGULAIR) 10 MG tablet Take 10 mg by mouth at bedtime.  . tamsulosin (FLOMAX) 0.4 MG CAPS capsule Take 0.4 mg by mouth daily after supper.   No facility-administered encounter medications on file as of 09/13/2015.    Functional Status:   In your present state of health, do you have any difficulty performing the following activities: 09/14/2015 09/13/2015  Hearing? - N  Vision? - N  Difficulty concentrating or making decisions? - N  Walking or climbing stairs? - Y  Dressing or bathing? - N  Doing errands, shopping? (No Data) Y  Quarry managerreparing Food and eating ? (No Data) N  Using the Toilet? - N  In the past six months, have you accidently leaked urine? - N  Do you have  problems with loss of bowel control? - N  Managing your Medications? - N  Managing your Finances? - N  Housekeeping or managing your Housekeeping? - Y   Fall Risk  09/13/2015 09/06/2015  Falls in the past year? - No  Risk for fall due to : (No Data) -  Risk for fall due to (comments): Discussed fall prevention due to long nasal cannula tubing -   Fall/Depression Screening:    PHQ 2/9 Scores 09/13/2015  PHQ - 2 Score 0    Assessment:  Routine home visit, patient at home alone. Patient ambulating independently in home  COPD Patient report  breathing is good on today, no increased cough , sputum production,  wearing her oxygen at 2 liters nasal cannula. Patient taking medications as prescribed, using a weekly pill box. Reviewed COPD zones with patient and provided magnet. Patient discussed her weight gain over the last year, expressed that she wants to be able to be more active, discussed Pulmonary rehab patient is interested.  Hypertension Patient taking medication as prescribed, no swelling noted. Patient does not monitor blood pressure, but tries to watch salt in her diet.  Chronic Back Pain Some limited activity related to her back pain, patient takes her medications as prescribed,follows up with Dr.Chasins and has an appointment for steroid injection on 4/12. Patient able to complete most of her usual activities at home, her brother assist her as needed with house cleaning, patient able to do some meal preparation.   Plan:  Patient will notify MD of worsening signs of COPD and 911 for emergency. Will  follow up on patient request for Pulmonary Rehab,MD contact for referral approval.   [facility] CM Care Plan Problem One        Most Recent Value   Care Plan Problem One  Lack of knowledge of COPD action plan   Role Documenting the Problem One  Care Management Coordinator   Care Plan for Problem One  Active   [facility] Long Term Goal (31-90 days)  Patient will verbalize increased knowledge of  COPD self care management   [facility] Long Term Goal Start Date  09/13/15   Interventions for Problem One Long Term Goal  Educated patient on importance of knowing what her breathing level is daily and actions to take, importance of notifying MD sooner for unrelieved or  worsen symptoms provided and reviewed COPD packet   [facility] CM Short Term Goal #1 (0-30 days)  Patient will be able to states at least 3 signs/symptoms of COPD flare in yellow zone and actions to take in the next 30 days   [facility] CM Short Term Goal #1 Start Date  09/13/15   Interventions for  Short Term Goal #1  Educated on zones of COPD , reviewed handout of zones, , and [facility] calendar    [facility] CM Short Term Goal #2 (0-30 days)  Patient will be able to state the 3-2-1 plan in the next 30 days   [facility] CM Short Term Goal #2 Start Date  09/13/15   Interventions for Short Term Goal #2  Educated on the plan and importance of seeking medication attention for unrelieved symptoms , and calling 911 for red zone symptoms    [facility] CM Short Term Goal #3 (0-30 days)  Patient will be referred to pulmonary rehab program pending MD approval  in the next 30 days   [facility] CM Short Term Goal #3 Start Date  09/13/15   Interventions for Short Tern Goal #3  Educated  patient on benefits of exercise to help lungs and heart  work better, reviewed [facility] pulmonary rehab website with patient, placed telephone call to them per patient request left her contact number to gain infromation about program.     [facility] CM Care Plan Problem Two        Most Recent Value   Care Plan Problem Two  Hypertension   Role Documenting the Problem Two  Care Management Coordinator   Care Plan for Problem Two  Active   Interventions for Problem Two Long Term Goal   Educated on importance of managing blood pressure,by monitoring salt in diet, taking medications as prescribed, and monitoring blood pressure   [facility]N Long Term Goal (31-90) days  Patient will be able to identify stategies for managing hypertension   [facility]N Long Term Goal Start Date  09/13/15   [facility] CM Short Term Goal #1 (0-30 days)  Patient will identify foods that are high in sodium to limit in diet in the next 30 days   [facility] CM Short Term Goal #1 Start Date  09/13/15   Interventions for Short Term Goal #2   Provded and reviewed high low salt foods handout,  How salt effects blood pressure, Reading label and sodium limit as daily guide    [facility] CM Short Term Goal #2 (0-30 days)  Patient will obtain blood pressure monior and begin to monitor blood pressure at least 3 days a week in the next 30  days   [facility] CM Short Term Goal #2 Start Date  09/13/15   Interventions for Short Term Goal #2  Educated patient on benefit of self managing by monitoring blood pressure and keeping a record, reviewed how to document in [facility] calendar book ,      Egbert Garibaldi, RN, [facility] Care Management [phone]- Mobile [phone]- Toll Free Main Office

## 2015-09-14 ENCOUNTER — Encounter: Payer: Self-pay | Admitting: *Deleted

## 2015-09-16 ENCOUNTER — Other Ambulatory Visit: Payer: Self-pay | Admitting: *Deleted

## 2015-09-16 NOTE — Patient Outreach (Signed)
[facility]) Care Management  09/16/2015  Heloise OchoaShirley J Sorn 10/27/1935 [id number]   Care Coordination call  Telephone call to Dr.Hande office to notify of patient interest in outpatient  Pulmonary Rehab program at [facility], and a referral would need to be sent to Pulmonary rehab if he approves.  Egbert GaribaldiKimberly Aiana Nordquist, RN, [facility] Care Management [phone] Mobile [phone] Toll Free Main Office

## 2015-09-18 DIAGNOSIS — M5136 Other intervertebral disc degeneration, lumbar region: Secondary | ICD-10-CM | POA: Diagnosis not present

## 2015-09-18 DIAGNOSIS — M5416 Radiculopathy, lumbar region: Secondary | ICD-10-CM | POA: Diagnosis not present

## 2015-09-30 DIAGNOSIS — J449 Chronic obstructive pulmonary disease, unspecified: Secondary | ICD-10-CM | POA: Diagnosis not present

## 2015-09-30 DIAGNOSIS — J9620 Acute and chronic respiratory failure, unspecified whether with hypoxia or hypercapnia: Secondary | ICD-10-CM | POA: Diagnosis not present

## 2015-09-30 DIAGNOSIS — I82409 Acute embolism and thrombosis of unspecified deep veins of unspecified lower extremity: Secondary | ICD-10-CM | POA: Diagnosis not present

## 2015-09-30 DIAGNOSIS — I10 Essential (primary) hypertension: Secondary | ICD-10-CM | POA: Diagnosis not present

## 2015-10-11 ENCOUNTER — Other Ambulatory Visit: Payer: Self-pay | Admitting: *Deleted

## 2015-10-11 NOTE — Patient Outreach (Signed)
[facility]) Care Management  10/11/2015  ALVILDA MCKENNA 1936/02/17 [id number]  Telephone assessment  Telephone call to Mrs.Zuver she reports her COPD has been under control, she states that she had a fall over a week ago, hitting her right knee.  Patient complaint of right knee discomfort with good and bad days, denies increased swelling or redness complaint of stiffness. Mrs.Worsley reports that she had not notified her PCP or Pain MD, because she had upcoming appointments. I encouraged patient to notify PCP for earlier office appointment for evaluation. Fall prevention reviewed.  Assessment Patient needs PCP follow up regarding her knee discomfort.   Plan Mrs. Wunder to contact PCP on today for office visit regarding her knee RNCM will meet patient in home on next week.   [facility] CM Care Plan Problem One        Most Recent Value   Care Plan Problem One  Lack of knowledge of COPD action plan   Role Documenting the Problem One  Care Management Coordinator   Care Plan for Problem One  Active   [facility]N Long Term Goal (31-90 days)  Patient will verbalize increased knowledge of  COPD self care management   [facility] Long Term Goal Start Date  09/13/15   Interventions for Problem One Long Term Goal  Reinforced with  patient on importance of knowing what her breathing level is daily and actions to take, importance of notifying MD sooner for unrelieved or  worsen symptoms provided and reviewed COPD packet   [facility] CM Short Term Goal #1 (0-30 days)  Patient will be able to states at least 3 signs/symptoms of COPD flare in yellow zone and actions to take in the next 30 days   [facility] CM Short Term Goal #1 Start Date  09/13/15   Interventions for Short Term Goal #1  Reinforced education  on zones of COPD , reviewed handout of zones, , and [facility] calendar    [facility] CM Short Term Goal #2 (0-30 days)  Patient will be able to state the 3-2-1 plan in the next 30 days   [facility] CM Short Term Goal #2 Start Date  09/13/15    Interventions for Short Term Goal #2  Educated on the plan and importance of seeking medication attention for unrelieved symptoms , and calling 911 for red zone symptoms    [facility] CM Short Term Goal #3 (0-30 days)  Patient will be referred to pulmonary rehab program pending MD approval  in the next 30 days   [facility] CM Short Term Goal #3 Start Date  09/13/15   [facility] CM Short Term Goal #3 Met Date  10/11/15    [facility] CM Care Plan Problem Two        Most Recent Value   Care Plan Problem Two  Hypertension   Role Documenting the Problem Two  Care Management Coordinator   Care Plan for Problem Two  Active   Interventions for Problem Two Long Term Goal   Educated on importance of managing blood pressure,by monitoring salt in diet, taking medications as prescribed, and monitoring blood pressure   [facility] Long Term Goal (31-90) days  Patient will be able to identify stategies for managing hypertension   [facility] Long Term Goal Start Date  09/13/15   [facility] CM Short Term Goal #1 (0-30 days)  Patient will identify foods that are high in sodium to limit in diet in the next 30 days   [facility] CM Short Term Goal #1 Start Date  09/13/15   [facility] CM Short Term Goal #1 Met Date   10/11/15   [facility] CM Short Term Goal #2 (0-30 days)  Patient will obtain blood pressure monior and begin to monitor blood pressure at least 3 days a week in the next 30 days   [facility] CM Short Term Goal #2 Start Date  09/13/15   Interventions for Short Term Goal #2  Educated patient on benefit of self managing by monitoring blood pressure and keeping a record, reviewed how to document in [facility] calendar book ,     [facility] CM Care Plan Problem Three        Most Recent Value   Care Plan Problem Three  Recent fall   Role Documenting the Problem Three  Care Management [place] for Problem Three  Active   [facility]N Long Term Goal (31-90) days  Patient will be able to report strategies to prevent falls in the next 31 days   [facility]N Long Term Goal Start Date  10/11/15    Interventions for Problem Three Long Term Goal  Educated patient on fall prevention measures,and importance of notifying MD of a fall with complaint of injury and to seek medical attention  h   [facility] CM Short Term Goal #1 (0-30 days)  Patient will report no falls in the next 30 days   [facility] CM Short Term Goal #1 Start Date  10/11/15   Interventions for Short Term Goal #1  Educated patient on importance of always using her walker, avoid climbing on step ladders   [facility] CM Short Term Goal #2 (0-30 days)  Patient will report using her walker at all times in the next 30 days   [facility] CM Short Term Goal #2 Start Date  10/11/15   Interventions for Short Term Goal #2  Explained to patient the benefit of her walker to prevent falls, and the importance of using it at all times,     Joylene Draft, RN, [place] Management [phone]- Mobile [phone]- [place]

## 2015-10-15 ENCOUNTER — Ambulatory Visit
Admission: EM | Admit: 2015-10-15 | Discharge: 2015-10-15 | Disposition: A | Discharge status: Home / Self Care | Payer: Commercial Managed Care - HMO | Attending: Family Medicine | Admitting: Family Medicine

## 2015-10-15 ENCOUNTER — Ambulatory Visit (INDEPENDENT_AMBULATORY_CARE_PROVIDER_SITE_OTHER): Payer: Commercial Managed Care - HMO

## 2015-10-15 DIAGNOSIS — S8991XA Unspecified injury of right lower leg, initial encounter: Secondary | ICD-10-CM | POA: Diagnosis not present

## 2015-10-15 DIAGNOSIS — S83421A Sprain of lateral collateral ligament of right knee, initial encounter: Secondary | ICD-10-CM

## 2015-10-15 DIAGNOSIS — M25561 Pain in right knee: Secondary | ICD-10-CM | POA: Diagnosis not present

## 2015-10-15 MED ORDER — NAPROXEN 250 MG PO TABS: 250.0000 mg | ORAL_TABLET | Freq: Two times a day (BID) | ORAL | Status: DC

## 2015-10-15 NOTE — Discharge Instructions (Signed)
How to Use a Knee Brace A knee brace is a device that you wear to support your knee, especially if the knee is healing after an injury or surgery. There are several types of knee braces. Some are designed to prevent an injury (prophylactic brace). These are often worn during sports. Others support an injured knee (functional brace) or keep it still while it heals (rehabilitative brace). People with severe arthritis of the knee may benefit from a brace that takes some pressure off the knee (unloader brace). Most knee braces are made from a combination of cloth and metal or plastic.  You may need to wear a knee brace to:  Relieve knee pain.  Help your knee support your weight (improve stability).  Help you walk farther (improve mobility).  Prevent injury.  Support your knee while it heals from surgery or from an injury. RISKS AND COMPLICATIONS Generally, knee braces are very safe to wear. However, problems may occur, including:  Skin irritation that may lead to infection.  Making your condition worse if you wear the brace in the wrong way. HOW TO USE A KNEE BRACE Different braces will have different instructions for use. Your health care provider will tell you or show you:  How to put on your brace.  How to adjust the brace.  When and how often to wear the brace.  How to remove the brace.  If you will need any assistive devices in addition to the brace, such as crutches or a cane. In general, your brace should:  Have the hinge of the brace line up with the bend of your knee.  Have straps, hooks, or tapes that fasten snugly around your leg.  Not feel too tight or too loose. HOW TO CARE FOR A KNEE BRACE  Check your brace often for signs of damage, such as loose connections or attachments. Your knee brace may get damaged or wear out during normal use.  Wash the fabric parts of your brace with soap and water.  Read the insert that comes with your brace for other specific care  instructions. SEEK MEDICAL CARE IF:  Your knee brace is too loose or too tight and you cannot adjust it.  Your knee brace causes skin redness, swelling, bruising, or irritation.  Your knee brace is not helping.  Your knee brace is making your knee pain worse.   This information is not intended to replace advice given to you by your health care provider. Make sure you discuss any questions you have with your health care provider.   Document Released: 08/15/2003 Document Revised: 02/13/2015 Document Reviewed: 09/17/2014 Elsevier Interactive Patient Education 2016 [employer]  Knee Sprain A knee sprain is a tear in one of the strong, fibrous tissues that connect the bones (ligaments) in your knee. The severity of the sprain depends on how much of the ligament is torn. The tear can be either partial or complete. CAUSES  Often, sprains are a result of a fall or injury. The force of the impact causes the fibers of your ligament to stretch too much. This excess tension causes the fibers of your ligament to tear. SIGNS AND SYMPTOMS  You may have some loss of motion in your knee. Other symptoms include:  Bruising.  Pain in the knee area.  Tenderness of the knee to the touch.  Swelling. DIAGNOSIS  To diagnose a knee sprain, your health care provider will physically examine your knee. Your health care provider may also suggest an X-ray exam  of your knee to make sure no bones are broken. °TREATMENT  °If your ligament is only partially torn, treatment usually involves keeping the knee in a fixed position (immobilization) or bracing your knee for activities that require movement for several weeks. To do this, your health care provider will apply a bandage, cast, or splint to keep your knee from moving and to support your knee during movement until it heals. For a partially torn ligament, the healing process usually takes 4-6 weeks. °If your ligament is completely torn, depending on which  ligament it is, you may need surgery to reconnect the ligament to the bone or reconstruct it. After surgery, a cast or splint may be applied and will need to stay on your knee for 4-6 weeks while your ligament heals. °HOME CARE INSTRUCTIONS °· Keep your injured knee elevated to decrease swelling. °· To ease pain and swelling, apply ice to the injured area: °¨ Put ice in a plastic bag. °¨ Place a towel between your skin and the bag. °¨ Leave the ice on for 20 minutes, 2-3 times a day. °· Only take medicine for pain as directed by your health care provider. °· Do not leave your knee unprotected until pain and stiffness go away (usually 4-6 weeks). °· If you have a cast or splint, do not allow it to get wet. If you have been instructed not to remove it, cover it with a plastic bag when you shower or bathe. Do not swim. °· Your health care provider may suggest exercises for you to do during your recovery to prevent or limit permanent weakness and stiffness. °SEEK IMMEDIATE MEDICAL CARE IF: °· Your cast or splint becomes damaged. °· Your pain becomes worse. °· You have significant pain, swelling, or numbness below the cast or splint. °MAKE SURE YOU: °· Understand these instructions. °· Will watch your condition. °· Will get help right away if you are not doing well or get worse. °  °This information is not intended to replace advice given to you by your health care provider. Make sure you discuss any questions you have with your health care provider. °  °Document Released: 05/25/2005 Document Revised: 06/15/2014 Document Reviewed: 01/04/2013 °Elsevier Interactive Patient Education ©2016 Elsevier Inc. ° °

## 2015-10-15 NOTE — ED Provider Notes (Signed)
CSN: [id number]     Arrival date & time 10/15/15  1418 History   First MD Initiated Contact with Patient 10/15/15 1504     Chief Complaint  Patient presents with  . Knee Pain    Right knee pain after falling 3 times over past month. Most recent fall a week ago. 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 The patient states that she's fallen 3 times the last month. Usually is from tripping but has never been from a syncopal episode. Most recent fall was approximately a week ago when she was going to stand on a two-step standing stool she fell with a varus force to her knee. She did not experience a pop. Since that time her knee has been hurting worse and seems mostly over the lateral collateral ligament on the femoral origin.  Past Medical History  Diagnosis Date  . Anxiety   . Allergy   . COPD (chronic obstructive pulmonary disease) (HCC)   . Oxygen deficiency   . Hypertension   . Hyperlipidemia   . Arthritis    Past Surgical History  Procedure Laterality Date  . Abdominal hysterectomy    . Spine surgery      Back surgery x8   Family History  Problem Relation Age of Onset  . Breast cancer Maternal Aunt    Social History  Substance Use Topics  . Smoking status: Former Smoker    Quit date: 10/06/2014  . Smokeless tobacco: None  . Alcohol Use: No   OB History    No data available     Review of Systems  Constitutional: Positive for activity change. Negative for fever, chills and fatigue.  Musculoskeletal: Positive for joint swelling and gait problem.  All other systems reviewed and are negative.   Allergies  Codeine; Penicillins; and Sulfa antibiotics  Home Medications   Prior to Admission medications   Medication Sig Start Date End Date Taking? Authorizing Provider  aspirin (ASPIRIN EC) 81 MG EC tablet Take  81 mg by mouth daily. Swallow whole.   Yes Historical Provider, MD  baclofen (LIORESAL) 10 MG tablet Take 10 mg by mouth 3 (three) times daily.   Yes Historical Provider, MD  cetirizine (ZYRTEC) 10 MG tablet Take 10 mg by mouth daily.   Yes Historical Provider, MD  diazepam (VALIUM) 5 MG tablet Take 5 mg by mouth every 6 (six) hours as needed for anxiety.   Yes Historical Provider, MD  docusate sodium (COLACE) 100 MG capsule Take 100 mg by mouth 2 (two) times daily as needed for mild constipation.   Yes Historical Provider, MD  escitalopram (LEXAPRO) 5 MG tablet Take 5 mg by mouth daily.   Yes Historical Provider, MD  ferrous sulfate 325 (65 FE) MG tablet Take 325 mg by mouth daily with breakfast.   Yes Historical Provider, MD  Fluticasone-Salmeterol (ADVAIR) 250-50 MCG/DOSE AEPB Inhale 1 puff into the lungs 2 (two) times daily.   Yes Historical Provider, MD  gabapentin (NEURONTIN) 300 MG capsule Take 300 mg by mouth 3 (three) times daily.   Yes Historical Provider, MD  HYDROcodone-acetaminophen (NORCO/VICODIN) 5-325 MG tablet Take 1 tablet by mouth 5 (five) times daily as needed for moderate pain. Do not exceed 5 times daily, patient at least 3 day   Yes Historical Provider, MD  metoprolol succinate (TOPROL-XL) 100 MG 24 hr tablet Take 100 mg by mouth daily. Take  with or immediately following a meal.   Yes Historical Provider, MD  montelukast (SINGULAIR) 10 MG tablet Take 10 mg by mouth at bedtime.   Yes Historical Provider, MD  tamsulosin (FLOMAX) 0.4 MG CAPS capsule Take 0.4 mg by mouth daily after supper.   Yes Historical Provider, MD  vitamin B-12 (CYANOCOBALAMIN) 1000 MCG tablet Take 1,000 mcg by mouth daily.   Yes Historical Provider, MD  albuterol (PROVENTIL HFA;VENTOLIN HFA) 108 (90 Base) MCG/ACT inhaler Inhale 2 puffs into the lungs every 6 (six) hours as needed for wheezing or shortness of breath.    Historical Provider, MD  Cyanocobalamin (VITAMIN B 12 PO) Take 1,000 mcg by mouth daily.     Historical Provider, MD  lovastatin (MEVACOR) 20 MG tablet Take 20 mg by mouth at bedtime.    Historical Provider, MD  naproxen (NAPROSYN) 250 MG tablet Take 1 tablet (250 mg total) by mouth 2 (two) times daily with a meal. 10/15/15   Lutricia FeilWilliam P Roemer, PA-C   Meds Ordered and Administered this Visit  Medications - No data to display  BP 157/67 mmHg  Pulse 62  Temp(Src) 97.6 F (36.4 C) (Oral)  Resp 18  Ht 5\' 4"  (1.626 m)  Wt 169 lb (76.658 kg)  BMI 28.99 kg/m2  SpO2 99% No data found.   Physical Exam  Constitutional: She is oriented to person, place, and time. She appears well-developed and well-nourished. No distress.  HENT:  Head: Normocephalic and atraumatic.  Eyes: Conjunctivae are normal. Pupils are equal, round, and reactive to light.  Neck: Normal range of motion. Neck supple.  Musculoskeletal: She exhibits edema and tenderness.  Examination of the right knee shows effusion present. There is no patellar apprehension or retropatellar tenderness. There is slight laxity of the lateral collateral ligament but has a endpoint. Medial collateral ligament is intact as is the anterior cruciate ligament. There is no effusion present.  Neurological: She is alert and oriented to person, place, and time.  Skin: Skin is warm and dry. She is not diaphoretic.  Psychiatric: She has a normal mood and affect. Her behavior is normal. Judgment and thought content normal.  Nursing note and vitals reviewed.   ED Course  Procedures (including critical care time)  Labs Review Labs Reviewed - No data to display  Imaging Review Dg Knee Complete 4 Views Right  10/15/2015  CLINICAL DATA:  Fall with lateral knee pain. Initial encounter. EXAM: RIGHT KNEE - COMPLETE 4+ VIEW COMPARISON:  None. FINDINGS: There is no evidence of fracture, dislocation, or joint effusion. No notable degenerative change. Osteopenia and atherosclerosis. IMPRESSION: No acute finding. Electronically Signed   By: Marnee SpringJonathon  Watts  M.D.   On: 10/15/2015 15:45     Visual Acuity Review  Right Eye Distance:   Left Eye Distance:   Bilateral Distance:    Right Eye Near:   Left Eye Near:    Bilateral Near:       MDM   1. Lateral collateral ligament sprain of knee, right, initial encounter    New Prescriptions   NAPROXEN (NAPROSYN) 250 MG TABLET    Take 1 tablet (250 mg total) by mouth 2 (two) times daily with a meal.  Plan: 1. Test/x-ray results and diagnosis reviewed with patient 2. rx as per orders; risks, benefits, potential side effects reviewed with patient 3. Recommend supportive treatment with Use of a walker for more stability and prevent falls. I will start her with a knee brace to help prevent the lateral side  of the knee which have caused pain and cause her to fall. Also give her a small amount of anti-inflammatory medications low dose first anti-inflammatory effect. Told her to take this with food. If she is not improving she should follow-up with her primary care physician for consideration of referral to an orthopedic surgeon. 4. F/u prn if symptoms worsen or don't improve A knee brace was offered to the patient but she refused stating a bit hard to doff and on her own. She prefers to use a neoprene brace that she has at home.     Lutricia Feil, PA-C 10/15/15 1623  Lutricia Feil, PA-C 10/15/15 1626

## 2015-10-15 NOTE — ED Notes (Signed)
Pt refused the kind on knee brace we had , stated it was too cumbersome /difficult for her to put on by herself and she didn't think she would wear it. She also stated she has a "rubber" kind of knee brace at home she would wear. Suggested she can also look at the pharmacy or [facility] for another kind of knee brace if she wanted to. Colon FlatteryBill Roemer PA notified.

## 2015-10-16 ENCOUNTER — Other Ambulatory Visit: Payer: Self-pay | Admitting: *Deleted

## 2015-10-16 ENCOUNTER — Encounter: Payer: Self-pay | Admitting: *Deleted

## 2015-10-16 NOTE — Patient Outreach (Signed)
[facility]) Care Management   10/16/2015  Bethany [name] January 03, 1936 [id number]  Bethany [name] is an 80 y.o. female  Subjective:  I went to urgent care yesterday because my knee was still bothering me, they say it is a ligament sprain and offered me a brace but it was too big and bulky and I know that I would not wear it. Patient discussed that she had one fall this month, she tripped while trying to sit on step stool and hurt her right knee, she reports 2 little falls last month no injury.   Objective: Patient resting in her chair, wearing her oxygen at nasal cannula 3 liters.  BP 140/80 mmHg  Pulse 60  Resp 18  SpO2 98%   Review of Systems  Constitutional: Negative.   HENT: Negative.   Eyes: Negative.   Respiratory: Negative.  Negative for shortness of breath.   Cardiovascular: Negative.   Gastrointestinal: Negative.   Genitourinary: Negative.   Musculoskeletal:       Recent fall in the last 2 weeks  Skin: Negative.   Neurological: Negative.   Endo/Heme/Allergies: Negative.   Psychiatric/Behavioral: Negative.     Physical Exam  Constitutional: She is oriented to person, place, and time. She appears well-developed and well-nourished.  Cardiovascular: Normal rate, normal heart sounds and intact distal pulses.   Respiratory: Effort normal.  GI: Soft.  Musculoskeletal:  Right knee tenderness, and very slight swelling  Neurological: She is alert and oriented to person, place, and time.  Skin: Skin is warm and dry.  Psychiatric: She has a normal mood and affect. Her behavior is normal. Judgment and thought content normal.    Encounter Medications:   Outpatient Encounter Prescriptions as of 10/16/2015  Medication Sig  . albuterol (PROVENTIL HFA;VENTOLIN HFA) 108 (90 Base) MCG/ACT inhaler Inhale 2 puffs into the lungs every 6 (six) hours as needed for wheezing or shortness of breath.  Marland Kitchen aspirin (ASPIRIN EC) 81 MG EC tablet Take 81 mg by mouth daily. Swallow  whole.  . baclofen (LIORESAL) 10 MG tablet Take 10 mg by mouth 3 (three) times daily.  . cetirizine (ZYRTEC) 10 MG tablet Take 10 mg by mouth daily.  . Cyanocobalamin (VITAMIN B 12 PO) Take 1,000 mcg by mouth daily.  . diazepam (VALIUM) 5 MG tablet Take 5 mg by mouth every 6 (six) hours as needed for anxiety. Patient takes once at night Reported on 10/16/2015  . docusate sodium (COLACE) 100 MG capsule Take 100 mg by mouth 2 (two) times daily as needed for mild constipation.  Marland Kitchen escitalopram (LEXAPRO) 5 MG tablet Take 5 mg by mouth daily.  . ferrous sulfate 325 (65 FE) MG tablet Take 325 mg by mouth daily with breakfast.  . Fluticasone-Salmeterol (ADVAIR) 250-50 MCG/DOSE AEPB Inhale 1 puff into the lungs 2 (two) times daily.  Marland Kitchen gabapentin (NEURONTIN) 300 MG capsule Take 300 mg by mouth 3 (three) times daily.  Marland Kitchen HYDROcodone-acetaminophen (NORCO/VICODIN) 5-325 MG tablet Take 1 tablet by mouth 5 (five) times daily as needed for moderate pain. Do not exceed 5 times daily, patient at least 3 day  . metoprolol succinate (TOPROL-XL) 100 MG 24 hr tablet Take 100 mg by mouth daily. Take with or immediately following a meal.  . montelukast (SINGULAIR) 10 MG tablet Take 10 mg by mouth at bedtime.  . tamsulosin (FLOMAX) 0.4 MG CAPS capsule Take 0.4 mg by mouth daily after supper.  . vitamin B-12 (CYANOCOBALAMIN) 1000 MCG tablet Take 1,000 mcg by  mouth daily.  Marland Kitchen lovastatin (MEVACOR) 20 MG tablet Take 20 mg by mouth at bedtime. Reported on 10/16/2015  . naproxen (NAPROSYN) 250 MG tablet Take 1 tablet (250 mg total) by mouth 2 (two) times daily with a meal. (Patient not taking: Reported on 10/16/2015)   No facility-administered encounter medications on file as of 10/16/2015.    Functional Status:   In your present state of health, do you have any difficulty performing the following activities: 10/16/2015 09/14/2015  Hearing? N -  Vision? N -  Difficulty concentrating or making decisions? N -  Walking or climbing  stairs? Y -  Dressing or bathing? N -  Doing errands, shopping? N (No Data)  Preparing Food and eating ? N (No Data)  Using the Toilet? N -  In the past six months, have you accidently leaked urine? N -  Do you have problems with loss of bowel control? N -  Managing your Medications? N -  Managing your Finances? N -  Housekeeping or managing your Housekeeping? Y -   Fall Risk  10/16/2015 10/11/2015 09/13/2015 09/06/2015  Falls in the past year? Yes Yes - No  Number falls in past yr: 2 or more 1 - -  Injury with Fall? Yes Yes - -  Risk Factor Category  High Fall Risk - - -  Risk for fall due to : History of fall(s);Impaired balance/gait History of fall(s) (No Data) -  Risk for fall due to (comments): - - Discussed fall prevention due to long nasal cannula tubing -  Follow up Education provided;Falls prevention discussed;Falls evaluation completed Falls prevention discussed - -   Fall/Depression Screening:    PHQ 2/9 Scores 10/16/2015 09/13/2015  PHQ - 2 Score 0 0    Assessment:    COPD Stable no exacerbations, Reviewed education on COPD Zones, action plan and importance of notifying MD for worsening symptoms. Patient plans to reschedule her appointment with pulmonary rehab until her knee gets better.  Recent Falls/Right knee discomfort Patient using cane during visit, she does have a rolling walker in the home, encouraged her to use her walking, reviewed fall prevention strategies. Denies dizziness when up ambulating in home with cane.Encouraged patient to follow up with her PCP regarding right knee discomfort and recent falls. She reports that it is improving she was able to sleep better last night. She has been taking her prn hydrocodone for pain. BethanyWilkerson plans to get her a different brace for right knee as suggested by MD at urgent care. She is alternating using a ice pack with heating pad on low seating during the day, educated on not using heating pad on high setting or sleeping with pad  on leg.   Bethany [name] states her brother Bethany [name] will assist her with transportation as needed.,  Plan:  RN will contact PCP office to notify of fall and patient compliant of right knee discomfort to arrange for a office visit, appointment scheduled for 5/15 . RN will follow up with patient by telephone in a week and schedule home visit in a month.   [facility] CM Care Plan Problem One        Most Recent Value   Care Plan Problem One  Lack of knowledge of COPD action plan   Role Documenting the Problem One  Care Management Coordinator   Care Plan for Problem One  Active   THN Long Term Goal (31-90 days)  Patient will verbalize increased knowledge of  COPD self care management in the  next 31 days    THN Long Term Goal Start Date  10/16/15 [goal date changed]   Interventions for Problem One Long Term Goal  Reviewed with  patient on importance of knowing what her breathing level is daily and actions to take, importance of notifying MD sooner for unrelieved or  worsen symptoms provided and reviewed COPD packet   THN CM Short Term Goal #1 (0-30 days)  Patient will be able to states at least 3 signs/symptoms of COPD flare in yellow zone and actions to take in the next 30 days   THN CM Short Term Goal #1 Start Date  10/16/15 [goal date restarted]   Interventions for Short Term Goal #1  provided and reviewed COPD zone magnet   THN CM Short Term Goal #2 (0-30 days)  Patient will be able to state the 3-2-1 plan in the next 30 days   THN CM Short Term Goal #2 Start Date  10/16/15 [goal date restarted]   Interventions for Short Term Goal #2  Reinforced education on the plan and importance of seeking medication attention for unrelieved symptoms , and calling 911 for red zone symptoms    THN CM Short Term Goal #3 (0-30 days)  Patient will be referred to pulmonary rehab program pending MD approval  in the next 30 days   THN CM Short Term Goal #3 Start Date  09/13/15   [facility] CM Short Term Goal #3 Met Date  10/11/15     [facility] CM Care Plan Problem Two        Most Recent Value   Care Plan Problem Two  Hypertension   Role Documenting the Problem Two  Care Management Coordinator   Care Plan for Problem Two  Active   Interventions for Problem Two Long Term Goal   Reinforced on the  on importance of managing blood pressure,by monitoring salt in diet, taking medications as prescribed, and monitoring blood pressure   THN Long Term Goal (31-90) days  Patient will be able to identify stategies for managing hypertension   THN Long Term Goal Start Date  10/16/15   THN CM Short Term Goal #1 (0-30 days)  Patient will identify foods that are high in sodium to limit in diet in the next 30 days   THN CM Short Term Goal #1 Start Date  09/13/15   [facility] CM Short Term Goal #1 Met Date   10/11/15   THN CM Short Term Goal #2 (0-30 days)  Patient will obtain blood pressure monior and begin to monitor blood pressure at least 3 days a week in the next 30 days   THN CM Short Term Goal #2 Start Date  10/16/15   Interventions for Short Term Goal #2  Patient has written herself a reminder note regarding obtaining another blood pressue monitor , will educate on meter after she obtains .     [facility] CM Care Plan Problem Three        Most Recent Value   Care Plan Problem Three  Recent fall   Role Documenting the Problem Three  Care Management Coordinator   Care Plan for Problem Three  Active   THN Long Term Goal (31-90) days  Patient will be able to report strategies to prevent falls in the next 31 days   THN Long Term Goal Start Date  10/11/15   Interventions for Problem Three Long Term Goal  RN call patient PCP to asssist her in getting an earlier appointment to follow up  on recent falls   THN CM Short Term Goal #1 (0-30 days)  Patient will report no falls in the next 30 days   THN CM Short Term Goal #1 Start Date  10/11/15   Interventions for Short Term Goal #1  Reinforced education on avoiding reaching for too high items and avoid  climbing on step ladders     THN CM Short Term Goal #2 (0-30 days)  Patient will report using her walker at all times in the next 30 days   THN CM Short Term Goal #2 Start Date  10/11/15   Interventions for Short Term Goal #2  Explained to patient the benefit of her walker to prevent falls,it provides more stability, and the importance of using it at all times,     Joylene Draft, RN, [place] Management [phone]- Mobile [phone]- [place]

## 2015-10-21 DIAGNOSIS — J432 Centrilobular emphysema: Secondary | ICD-10-CM | POA: Diagnosis not present

## 2015-10-21 DIAGNOSIS — M25561 Pain in right knee: Secondary | ICD-10-CM | POA: Diagnosis not present

## 2015-10-21 DIAGNOSIS — I10 Essential (primary) hypertension: Secondary | ICD-10-CM | POA: Diagnosis not present

## 2015-10-22 ENCOUNTER — Ambulatory Visit: Payer: Commercial Managed Care - HMO | Admitting: *Deleted

## 2015-10-22 ENCOUNTER — Other Ambulatory Visit: Payer: Self-pay | Admitting: Internal Medicine

## 2015-10-22 ENCOUNTER — Other Ambulatory Visit: Payer: Self-pay | Admitting: *Deleted

## 2015-10-22 ENCOUNTER — Ambulatory Visit: Payer: Commercial Managed Care - HMO

## 2015-10-22 DIAGNOSIS — M25561 Pain in right knee: Secondary | ICD-10-CM

## 2015-10-22 NOTE — Patient Outreach (Signed)
[facility]) Care Management  10/22/2015  Bethany [name] 1935/11/05 [id number]   Telephone assessment  Follow telephone call to [name] after her PCP visit on yesterday. Patient reports she was prescribed a new medication for her right knee discomfort meloxicam , and her knee feels much better on today, but the MD wants her to have a MRI.  Bethany [name] expressed gratefulness for assistance in arranging MD visit. Fall precautions reviewed.  Patient denies any further concerns at this time.   Plan Follow up with patient for scheduled home visit in June.  THN CM Care Plan Problem One        Most Recent Value   Care Plan Problem One  Lack of knowledge of COPD action plan   Role Documenting the Problem One  Care Management Coordinator   Care Plan for Problem One  Active   THN Long Term Goal (31-90 days)  Patient will verbalize increased knowledge of  COPD self care management in the next 31 days    THN Long Term Goal Start Date  10/16/15 [goal date changed]   Interventions for Problem One Long Term Goal  Reviewed with  patient on importance of knowing what her breathing level is daily and actions to take, importance of notifying MD sooner for unrelieved or  worsen symptoms provided and reviewed COPD packet   THN CM Short Term Goal #1 (0-30 days)  Patient will be able to states at least 3 signs/symptoms of COPD flare in yellow zone and actions to take in the next 30 days   THN CM Short Term Goal #1 Start Date  10/16/15 [goal date restarted]   Interventions for Short Term Goal #1  provided and reviewed COPD zone magnet   THN CM Short Term Goal #2 (0-30 days)  Patient will be able to state the 3-2-1 plan in the next 30 days   THN CM Short Term Goal #2 Start Date  10/16/15 [goal date restarted]   Interventions for Short Term Goal #2  Reinforced education on the plan and importance of seeking medication attention for unrelieved symptoms , and calling 911 for red zone symptoms    THN CM Short Term Goal #3 (0-30 days)  Patient will be referred to pulmonary rehab program pending MD approval  in the next 30 days   THN CM Short Term Goal #3 Start Date  09/13/15   [facility] CM Short Term Goal #3 Met Date  10/11/15    [facility] CM Care Plan Problem Two        Most Recent Value   Care Plan Problem Two  Hypertension   Role Documenting the Problem Two  Care Management Coordinator   Care Plan for Problem Two  Active   Interventions for Problem Two Long Term Goal   Reinforced on the  on importance of managing blood pressure,by monitoring salt in diet, taking medications as prescribed, and monitoring blood pressure   THN Long Term Goal (31-90) days  Patient will be able to identify stategies for managing hypertension   THN Long Term Goal Start Date  10/16/15   THN CM Short Term Goal #1 (0-30 days)  Patient will identify foods that are high in sodium to limit in diet in the next 30 days   THN CM Short Term Goal #1 Start Date  09/13/15   [facility] CM Short Term Goal #1 Met Date   10/11/15   THN CM Short Term Goal #2 (0-30 days)  Patient will obtain blood pressure  monior and begin to monitor blood pressure at least 3 days a week in the next 30 days   THN CM Short Term Goal #2 Start Date  10/16/15   Interventions for Short Term Goal #2  Reviewed locations to purchase monitor , [facility], [facility]. patient's brother to explore options    [facility] CM Care Plan Problem Three        Most Recent Value   Care Plan Problem Three  Recent fall   Role Documenting the Problem Three  Care Management Coordinator   Care Plan for Problem Three  Active   THN Long Term Goal (31-90) days  Patient will be able to report strategies to prevent falls in the next 31 days   THN Long Term Goal Start Date  10/11/15   Interventions for Problem Three Long Term Goal  RN call patient PCP to asssist her in getting an earlier appointment to follow up on recent falls   THN CM Short Term Goal #1 (0-30 days)  Patient will report no falls  in the next 30 days   THN CM Short Term Goal #1 Start Date  10/11/15   Interventions for Short Term Goal #1  Reinforced education on avoiding reaching for too high items and avoid climbing on step ladders     THN CM Short Term Goal #2 (0-30 days)  Patient will report using her walker at all times in the next 30 days   THN CM Short Term Goal #2 Start Date  10/11/15   Interventions for Short Term Goal #2  Explained to patient the benefit of her walker to prevent falls,it provides more stability, and the importance of using it at all times,     Bethany Draft, RN, [place] Management [phone]- Mobile [phone]- [place]

## 2015-10-30 DIAGNOSIS — J449 Chronic obstructive pulmonary disease, unspecified: Secondary | ICD-10-CM | POA: Diagnosis not present

## 2015-10-30 DIAGNOSIS — I82409 Acute embolism and thrombosis of unspecified deep veins of unspecified lower extremity: Secondary | ICD-10-CM | POA: Diagnosis not present

## 2015-10-30 DIAGNOSIS — J9620 Acute and chronic respiratory failure, unspecified whether with hypoxia or hypercapnia: Secondary | ICD-10-CM | POA: Diagnosis not present

## 2015-10-30 DIAGNOSIS — I10 Essential (primary) hypertension: Secondary | ICD-10-CM | POA: Diagnosis not present

## 2015-11-12 ENCOUNTER — Encounter: Payer: Self-pay | Admitting: *Deleted

## 2015-11-12 ENCOUNTER — Other Ambulatory Visit: Payer: Self-pay | Admitting: *Deleted

## 2015-11-12 NOTE — Patient Outreach (Signed)
[facility]) Care Management   11/12/2015  Bethany [name] October 03, 1935 [id number]  Bethany [name] is an 80 y.o. female  Subjective:  I am feeling pretty good now". Bethany [name] discussed two recent falls that she had, one loss of balance going down garage steps landing on back, and fall in bathroom slipped from commode hit her head on the cabinet, reported being a little dizzy after that fall and having bruise to left ear lobe, denied headache, she did not notify MD of falls and managed to get herself up.  Denies dizziness or headache today. Patient reports her knee discomfort has improved from her previous fall.Bethany [name] reports that she has tolerated walking at least 5 minutes at a time outside in preparation for joining pulmonary rehab.    Objective:  BP 122/60 mmHg  Pulse 56  Resp 18  SpO2 98% Review of Systems  Constitutional: Negative.   HENT: Negative.   Eyes: Negative.   Respiratory: Negative.   Cardiovascular: Negative.  Negative for leg swelling.  Gastrointestinal: Negative.   Genitourinary: Negative.   Musculoskeletal: Positive for falls.  Skin: Negative.   Neurological: Negative.  Negative for dizziness and tingling.  Endo/Heme/Allergies: Negative.   Psychiatric/Behavioral: Negative.     Physical Exam  Constitutional: She appears well-developed and well-nourished.  Cardiovascular: Normal rate, normal heart sounds and intact distal pulses.   Respiratory: Effort normal.  GI: Soft.  Neurological: She is alert.  Skin: Skin is warm and dry.     Psychiatric: She has a normal mood and affect. Her behavior is normal. Judgment and thought content normal.    Encounter Medications:   Outpatient Encounter Prescriptions as of 11/12/2015  Medication Sig  . albuterol (PROVENTIL HFA;VENTOLIN HFA) 108 (90 Base) MCG/ACT inhaler Inhale 2 puffs into the lungs every 6 (six) hours as needed for wheezing or shortness of breath.  Marland Kitchen aspirin (ASPIRIN EC) 81 MG EC tablet Take  81 mg by mouth daily. Swallow whole.  . baclofen (LIORESAL) 10 MG tablet Take 10 mg by mouth 3 (three) times daily.  . cetirizine (ZYRTEC) 10 MG tablet Take 10 mg by mouth daily.  . Cyanocobalamin (VITAMIN B 12 PO) Take 1,000 mcg by mouth daily.  . diazepam (VALIUM) 5 MG tablet Take 5 mg by mouth every 6 (six) hours as needed for anxiety. Patient takes once at night Reported on 10/16/2015  . docusate sodium (COLACE) 100 MG capsule Take 100 mg by mouth 2 (two) times daily as needed for mild constipation.  Marland Kitchen escitalopram (LEXAPRO) 5 MG tablet Take 5 mg by mouth daily.  . ferrous sulfate 325 (65 FE) MG tablet Take 325 mg by mouth daily with breakfast.  . Fluticasone-Salmeterol (ADVAIR) 250-50 MCG/DOSE AEPB Inhale 1 puff into the lungs 2 (two) times daily.  Marland Kitchen gabapentin (NEURONTIN) 300 MG capsule Take 300 mg by mouth 3 (three) times daily.  Marland Kitchen HYDROcodone-acetaminophen (NORCO/VICODIN) 5-325 MG tablet Take 1 tablet by mouth 5 (five) times daily as needed for moderate pain. Do not exceed 5 times daily, patient at least 3 day  . lovastatin (MEVACOR) 20 MG tablet Take 20 mg by mouth at bedtime. Reported on 10/16/2015  . metoprolol succinate (TOPROL-XL) 100 MG 24 hr tablet Take 100 mg by mouth daily. Take with or immediately following a meal.  . montelukast (SINGULAIR) 10 MG tablet Take 10 mg by mouth at bedtime.  . naproxen (NAPROSYN) 250 MG tablet Take 1 tablet (250 mg total) by mouth 2 (two) times daily with a meal. (  Patient not taking: Reported on 10/16/2015)  . tamsulosin (FLOMAX) 0.4 MG CAPS capsule Take 0.4 mg by mouth daily after supper.  . vitamin B-12 (CYANOCOBALAMIN) 1000 MCG tablet Take 1,000 mcg by mouth daily.   No facility-administered encounter medications on file as of 11/12/2015.    Functional Status:   In your present state of health, do you have any difficulty performing the following activities: 10/16/2015 09/14/2015  Hearing? N -  Vision? N -  Difficulty concentrating or making  decisions? N -  Walking or climbing stairs? Y -  Dressing or bathing? N -  Doing errands, shopping? N (No Data)  Preparing Food and eating ? N (No Data)  Using the Toilet? N -  In the past six months, have you accidently leaked urine? N -  Do you have problems with loss of bowel control? N -  Managing your Medications? N -  Managing your Finances? N -  Housekeeping or managing your Housekeeping? Y -   Fall Risk  11/12/2015 10/16/2015 10/11/2015 09/13/2015 09/06/2015  Falls in the past year? Yes Yes Yes - No  Number falls in past yr: 2 or more 2 or more 1 - -  Injury with Fall? Yes Yes Yes - -  Risk Factor Category  High Fall Risk High Fall Risk - - -  Risk for fall due to : Impaired balance/gait;History of fall(s) History of fall(s);Impaired balance/gait History of fall(s) (No Data) -  Risk for fall due to (comments): - - - Discussed fall prevention due to long nasal cannula tubing -  Follow up Falls evaluation completed;Education provided;Falls prevention discussed Education provided;Falls prevention discussed;Falls evaluation completed Falls prevention discussed - -   Fall/Depression Screening:    PHQ 2/9 Scores 10/16/2015 09/13/2015  PHQ - 2 Score 0 0    Assessment:  Routine home visit   COPD- patient denies any episodes of worsening symptoms and is able to state symptoms and action plan. Patient feels she is getting closer to being able to start pulmonary rehab. Bethany [name] interested in traveling to [place] to visit a friend or going to the beach at sometime.  Fall Risk- Fall prevention and education reviewed. Educated patient on importance of notifying MD sooner of fall occurences. Patient denies any symptoms of dizziness or headache, gait steady during visit, reminded patient importance of using her cane or walker even in her home.   Blood Pressure Management Patient currently using her neighbors blood pressure monitor until her brother can assist her with picking out one, recent blood  pressure 120/70's. Patient continues to limit amount of salt intake.  Encouraged patient to notify RNCM for concerns.   Plan:  RN will provide education on fall prevention, scheduled EMMI on fall prevention  for patient to view in the next 3 weeks. RN called  Dr.Hande office to inform them of patient recent falls, and patient denies current discomfort, patient has scheduled upcoming appointment to attend but will notify MD of problems prior to visit. Patient will notify Pulmonary Rehab regarding orientation and start date. Patient will notify MD of any new symptoms , dizziness, weakness at the time of occurrence. RN will review and update patient centered goals. Will schedule home visit for next month and depend on patient status will transition to health coach for continued education and management of COPD patient in agreement. RN placed call to Huey Romans regarding patient question regarding traveling out of town and oxygen supply, patient questions answered by representative while on speaker phone. RN will send  EMMI education video for patient to watch.   THN CM Care Plan Problem One        Most Recent Value   Care Plan Problem One  Lack of knowledge of COPD action plan   Role Documenting the Problem One  Care Management Coordinator   Care Plan for Problem One  Active   THN Long Term Goal (31-90 days)  Patient will verbalize increased knowledge of  COPD self care management in the next 31 days    THN Long Term Goal Start Date  10/16/15 [goal date changed]   [facility] Long Term Goal Met Date  11/12/15   [facility]N CM Short Term Goal #1 (0-30 days)  Patient will be able to states at least 3 signs/symptoms of COPD flare in yellow zone and actions to take in the next 30 days   [facility]N CM Short Term Goal #1 Start Date  10/16/15 [goal date restarted]   [facility] CM Short Term Goal #1 Met Date  11/12/15   [facility] CM Short Term Goal #2 (0-30 days)  Patient will be able to state the 3-2-1 plan in the next 30 days   THN CM  Short Term Goal #2 Start Date  10/16/15 [goal date restarted]   [facility] CM Short Term Goal #2 Met Date  11/12/15   [facility]N CM Short Term Goal #3 (0-30 days)  Patient will be referred to pulmonary rehab program pending MD approval  in the next 30 days   [facility]N CM Short Term Goal #3 Start Date  09/13/15   [facility] CM Short Term Goal #3 Met Date  10/11/15    [facility] CM Care Plan Problem Two        Most Recent Value   Care Plan Problem Two  Hypertension   Role Documenting the Problem Two  Care Management Coordinator   Care Plan for Problem Two  Active   Interventions for Problem Two Long Term Goal   Reviewed with patient stategies of managing blood pressure,by monitoring salt in diet, taking medications as prescribed, and monitoring blood pressure   THN Long Term Goal (31-90) days  Patient will be able to identify stategies for managing hypertension   THN Long Term Goal Start Date  10/16/15   [facility] CM Short Term Goal #1 (0-30 days)  Patient will identify foods that are high in sodium to limit in diet in the next 30 days   [facility]N CM Short Term Goal #1 Start Date  09/13/15   [facility] CM Short Term Goal #1 Met Date   10/11/15   [facility] CM Short Term Goal #2 (0-30 days)  Patient will obtain blood pressure monior and begin to monitor blood pressure at least 3 days a week in the next 30 days   [facility]N CM Short Term Goal #2 Start Date  10/16/15   Interventions for Short Term Goal #2  Reviewed locations to purchase monitor , [employer], [employer]. patient's brother to explore options    [facility] CM Care Plan Problem Three        Most Recent Value   Care Plan Problem Three  Recent fall   Role Documenting the Problem Three  Care Management Coordinator   Care Plan for Problem Three  Active   THN Long Term Goal (31-90) days  Patient will be able to report strategies to prevent falls in the next 31 days   THN Long Term Goal Start Date  10/11/15   Interventions for Problem Three Long Term Goal  Educated patient on  the importance of notifying MD of  recurrent fall and schedule office visit for follow up to identify any injury especially if a question of her hitting her head.   [facility]N CM Short Term Goal #1 (0-30 days)  Patient will report no falls in the next 30 days   [facility]N CM Short Term Goal #1 Start Date  11/12/15 [goal date restarted]   Interventions for Short Term Goal #1  Educated patient on how reaching too far in front of her can lead to a fall, make sure she reach out at safe distance,  in front of her. and wear supportive shoes, Scheduled EMMI video education for patient to watch.   [facility] CM Short Term Goal #2 (0-30 days)  Patient will report using her walker at all times in the next 30 days   [facility] CM Short Term Goal #2 Start Date  11/12/15 [goal date restarted]   Interventions for Short Term Goal #2  Encouraged patient to use her cane or walker in the home to increase her stability with walking and prevent fall    [facility]N CM Short Term Goal #3 (0-30 days)  Patient will report being active with walking or chair exercises at least 10 minutes a day in the next 30 days   [facility]N  CM Short Term Goal #3 Start Date  11/12/15   Interventions for Short Term Goal #3  Educated patient on how activity helps improve balance, encourage to avoid walking during the heat of the day, plan mornings , demonstrated chair exercises that she can do, exercise time can be divided throughout the day ,      Joylene Draft, RN, [facility] [phone]- Mobile [phone]- [place]

## 2015-11-15 DIAGNOSIS — K219 Gastro-esophageal reflux disease without esophagitis: Secondary | ICD-10-CM | POA: Diagnosis not present

## 2015-11-15 DIAGNOSIS — M5416 Radiculopathy, lumbar region: Secondary | ICD-10-CM | POA: Diagnosis not present

## 2015-11-15 DIAGNOSIS — M159 Polyosteoarthritis, unspecified: Secondary | ICD-10-CM | POA: Diagnosis not present

## 2015-11-15 DIAGNOSIS — I10 Essential (primary) hypertension: Secondary | ICD-10-CM | POA: Diagnosis not present

## 2015-11-15 DIAGNOSIS — F419 Anxiety disorder, unspecified: Secondary | ICD-10-CM | POA: Diagnosis not present

## 2015-11-15 DIAGNOSIS — J432 Centrilobular emphysema: Secondary | ICD-10-CM | POA: Diagnosis not present

## 2015-11-15 DIAGNOSIS — E538 Deficiency of other specified B group vitamins: Secondary | ICD-10-CM | POA: Diagnosis not present

## 2015-11-18 DIAGNOSIS — M4726 Other spondylosis with radiculopathy, lumbar region: Secondary | ICD-10-CM | POA: Diagnosis not present

## 2015-11-18 DIAGNOSIS — R2689 Other abnormalities of gait and mobility: Secondary | ICD-10-CM | POA: Diagnosis not present

## 2015-11-18 DIAGNOSIS — M5136 Other intervertebral disc degeneration, lumbar region: Secondary | ICD-10-CM | POA: Diagnosis not present

## 2015-11-18 DIAGNOSIS — M5416 Radiculopathy, lumbar region: Secondary | ICD-10-CM | POA: Diagnosis not present

## 2015-11-18 DIAGNOSIS — S8391XA Sprain of unspecified site of right knee, initial encounter: Secondary | ICD-10-CM | POA: Diagnosis not present

## 2015-11-22 DIAGNOSIS — I10 Essential (primary) hypertension: Secondary | ICD-10-CM | POA: Diagnosis not present

## 2015-11-22 DIAGNOSIS — R296 Repeated falls: Secondary | ICD-10-CM | POA: Diagnosis not present

## 2015-11-22 DIAGNOSIS — K219 Gastro-esophageal reflux disease without esophagitis: Secondary | ICD-10-CM | POA: Diagnosis not present

## 2015-11-22 DIAGNOSIS — M5416 Radiculopathy, lumbar region: Secondary | ICD-10-CM | POA: Diagnosis not present

## 2015-11-22 DIAGNOSIS — M5442 Lumbago with sciatica, left side: Secondary | ICD-10-CM | POA: Diagnosis not present

## 2015-11-22 DIAGNOSIS — M25561 Pain in right knee: Secondary | ICD-10-CM | POA: Diagnosis not present

## 2015-11-22 DIAGNOSIS — Z0000 Encounter for general adult medical examination without abnormal findings: Secondary | ICD-10-CM | POA: Diagnosis not present

## 2015-11-22 DIAGNOSIS — F419 Anxiety disorder, unspecified: Secondary | ICD-10-CM | POA: Diagnosis not present

## 2015-11-22 DIAGNOSIS — J432 Centrilobular emphysema: Secondary | ICD-10-CM | POA: Diagnosis not present

## 2015-11-30 DIAGNOSIS — J449 Chronic obstructive pulmonary disease, unspecified: Secondary | ICD-10-CM | POA: Diagnosis not present

## 2015-11-30 DIAGNOSIS — I82409 Acute embolism and thrombosis of unspecified deep veins of unspecified lower extremity: Secondary | ICD-10-CM | POA: Diagnosis not present

## 2015-11-30 DIAGNOSIS — I10 Essential (primary) hypertension: Secondary | ICD-10-CM | POA: Diagnosis not present

## 2015-11-30 DIAGNOSIS — J9620 Acute and chronic respiratory failure, unspecified whether with hypoxia or hypercapnia: Secondary | ICD-10-CM | POA: Diagnosis not present

## 2015-12-12 ENCOUNTER — Other Ambulatory Visit: Payer: Self-pay | Admitting: *Deleted

## 2015-12-12 ENCOUNTER — Encounter: Payer: Self-pay | Admitting: *Deleted

## 2015-12-12 NOTE — Patient Outreach (Signed)
[facility]) Care Management   12/12/2015  ARDENA [name] 12-07-1935 [id number]  ABIHA [name] is an 80 y.o. female  Subjective: I have been doing pretty good with my breathing, I just had another fall 2 days ago, going to the bathroom,"I didn't turn the light on and didn't have my cane, I just hit by left knee and bumped my head on plastic trash and have a little bump there other than that I am okay. Patient discussed that she has not heard from clinic regarding outpatient physical therapy. Bethany [name] discussed that she is ready to get started with pulmonary rehab after she finishes with therapy.   Objective:  BP 116/60 mmHg  Pulse 59  Resp 18  SpO2 98% Review of Systems  Constitutional: Negative.   HENT: Negative.   Eyes: Negative.   Respiratory: Negative.   Cardiovascular: Negative.   Gastrointestinal: Negative.   Genitourinary: Negative.   Musculoskeletal: Positive for falls.  Skin: Negative.   Neurological: Negative.   Psychiatric/Behavioral: Negative.     Physical Exam  Constitutional: She is oriented to person, place, and time. She appears well-developed and well-nourished.  Cardiovascular: Normal rate, normal heart sounds and intact distal pulses.   Respiratory: Effort normal and breath sounds normal.  GI: Soft.  Musculoskeletal: Normal range of motion.  Neurological: She is alert and oriented to person, place, and time.  Skin: Skin is warm and dry.  Psychiatric: She has a normal mood and affect. Her behavior is normal. Judgment and thought content normal.    Encounter Medications:   Outpatient Encounter Prescriptions as of 12/12/2015  Medication Sig  . albuterol (PROVENTIL HFA;VENTOLIN HFA) 108 (90 Base) MCG/ACT inhaler Inhale 2 puffs into the lungs every 6 (six) hours as needed for wheezing or shortness of breath.  Marland Kitchen aspirin (ASPIRIN EC) 81 MG EC tablet Take 81 mg by mouth daily. Swallow whole.  . baclofen (LIORESAL) 10 MG tablet Take 10 mg by mouth  3 (three) times daily.  . cetirizine (ZYRTEC) 10 MG tablet Take 10 mg by mouth daily.  . Cyanocobalamin (VITAMIN B 12 PO) Take 1,000 mcg by mouth daily.  . diazepam (VALIUM) 5 MG tablet Take 5 mg by mouth every 6 (six) hours as needed for anxiety. Patient takes once at night Reported on 10/16/2015  . docusate sodium (COLACE) 100 MG capsule Take 100 mg by mouth 2 (two) times daily as needed for mild constipation.  Marland Kitchen escitalopram (LEXAPRO) 5 MG tablet Take 5 mg by mouth daily.  . ferrous sulfate 325 (65 FE) MG tablet Take 325 mg by mouth daily with breakfast.  . Fluticasone-Salmeterol (ADVAIR) 250-50 MCG/DOSE AEPB Inhale 1 puff into the lungs 2 (two) times daily.  Marland Kitchen gabapentin (NEURONTIN) 300 MG capsule Take 300 mg by mouth 3 (three) times daily.  Marland Kitchen HYDROcodone-acetaminophen (NORCO/VICODIN) 5-325 MG tablet Take 1 tablet by mouth 5 (five) times daily as needed for moderate pain. Do not exceed 5 times daily, patient at least 3 day  . lovastatin (MEVACOR) 20 MG tablet Take 20 mg by mouth at bedtime. Reported on 10/16/2015  . metoprolol succinate (TOPROL-XL) 100 MG 24 hr tablet Take 100 mg by mouth daily. Take with or immediately following a meal.  . montelukast (SINGULAIR) 10 MG tablet Take 10 mg by mouth at bedtime.  . naproxen (NAPROSYN) 250 MG tablet Take 1 tablet (250 mg total) by mouth 2 (two) times daily with a meal. (Patient not taking: Reported on 10/16/2015)  . tamsulosin (FLOMAX) 0.4 MG  CAPS capsule Take 0.4 mg by mouth daily after supper.  . vitamin B-12 (CYANOCOBALAMIN) 1000 MCG tablet Take 1,000 mcg by mouth daily.   No facility-administered encounter medications on file as of 12/12/2015.    Functional Status:   In your present state of health, do you have any difficulty performing the following activities: 10/16/2015 09/14/2015  Hearing? N -  Vision? N -  Difficulty concentrating or making decisions? N -  Walking or climbing stairs? Y -  Dressing or bathing? N -  Doing errands, shopping?  N (No Data)  Preparing Food and eating ? N (No Data)  Using the Toilet? N -  In the past six months, have you accidently leaked urine? N -  Do you have problems with loss of bowel control? N -  Managing your Medications? N -  Managing your Finances? N -  Housekeeping or managing your Housekeeping? Y -    Fall/Depression Screening:    PHQ 2/9 Scores 11/12/2015 10/16/2015 09/13/2015  PHQ - 2 Score 0 0 0    Assessment:  Routine Home Visit  COPD Patient wears oxygen at 3 liters continuous, denies any increase in shortness of breath or cough. Patient interested in pulmonary rehab once completed with physical  therapy.  Falls Reports 1 fall in the last 30 days, no injury. Will benefit from therapy for balance and strength, education on fall prevention education.   Hypertension Patient does not monitor blood pressure yet, she continues to limit salt in her diet.   Bethany [name] is progressing well meeting care management goals and is in agreement with transition to health coach for continued education and management of COPD once outpatient physical therapy  is resolved.  Plan:  Will place call to follow up on outpatient physical therapy, placed call to [facility], office states that they do not do therapy for balance and falls, states they  referred back to Dr.Hande office and suggested outpatient therapy at [facility],  called Dr. Ginette Pitman office to explain situation to them. Will follow up with patient within one week or sooner regarding outpatient physical therapy    [facility] CM Care Plan Problem One        Most Recent Value   Care Plan Problem One  Lack of knowledge of COPD action plan   Role Documenting the Problem One  Care Management Coordinator   Care Plan for Problem One  Active   THN Long Term Goal (31-90 days)  Patient will verbalize increased knowledge of  COPD self care management in the next 31 days    [facility]N Long Term Goal Start Date  10/16/15 [goal date changed]   [facility]  Long Term Goal Met Date  11/12/15   [facility] CM Short Term Goal #1 (0-30 days)  Patient will be able to states at least 3 signs/symptoms of COPD flare in yellow zone and actions to take in the next 30 days   [facility]N CM Short Term Goal #1 Start Date  10/16/15 [goal date restarted]   [facility] CM Short Term Goal #1 Met Date  11/12/15   [facility] CM Short Term Goal #2 (0-30 days)  Patient will be able to state the 3-2-1 plan in the next 30 days   [facility]N CM Short Term Goal #2 Start Date  10/16/15 [goal date restarted]   [facility] CM Short Term Goal #2 Met Date  11/12/15   [facility] CM Short Term Goal #3 (0-30 days)  Patient will be referred to pulmonary rehab program pending MD  approval  in the next 30 days   [facility]N CM Short Term Goal #3 Start Date  09/13/15   [facility] CM Short Term Goal #3 Met Date  10/11/15    [facility] CM Care Plan Problem Two        Most Recent Value   Care Plan Problem Two  Hypertension   Role Documenting the Problem Two  Care Management Coordinator   Care Plan for Problem Two  Active   THN Long Term Goal (31-90) days  Patient will be able to identify stategies for managing hypertension   THN Long Term Goal Start Date  11/12/15   [facility]N Long Term Goal Met Date  12/12/15   [facility] CM Short Term Goal #1 (0-30 days)  Patient will identify foods that are high in sodium to limit in diet in the next 30 days   [facility]N CM Short Term Goal #1 Start Date  09/13/15   [facility] CM Short Term Goal #1 Met Date   10/11/15   [facility]N CM Short Term Goal #2 (0-30 days)  Patient will obtain blood pressure monior and begin to monitor blood pressure at least 3 days a week in the next 30 days   [facility]N CM Short Term Goal #2 Start Date  11/12/15   Interventions for Short Term Goal #2  Wrote names of blood pressure monitors and reviewed with patient on internet, patient placed sticky note on her refrigerator     [facility] CM Care Plan Problem Three        Most Recent Value   Care Plan Problem Three  Recent fall   Role Documenting the Problem Three  Care Management  Coordinator   Care Plan for Problem Three  Active   THN Long Term Goal (31-90) days  Patient will be able to report strategies to prevent falls in the next 31 days   THN Long Term Goal Start Date  11/12/15 Barrie Folk date 6/6 updated ]   Interventions for Problem Three Long Term Goal  Reinforced stategies to prevent, decrease fall risk   [facility]N CM Short Term Goal #1 (0-30 days)  Patient will report no falls in the next 30 days   [facility]N CM Short Term Goal #1 Start Date  11/12/15 [goal date restarted]   Interventions for Short Term Goal #1  Reinforced with patient to make sure  she turns the light on a night    [facility]N CM Short Term Goal #2 (0-30 days)  Patient will report using her walker or cane at all times in the next 30 days [goal updated]   [facility]N CM Short Term Goal #2 Start Date  11/12/15 [goal date restarted]   Interventions for Short Term Goal #2  Reinforced with patient to use cane in home to help with fall prevention to help with balance   [facility]N CM Short Term Goal #3 (0-30 days)  Patient will report being active with walking or chair exercises at least 10 minutes a day in the next 30 days   [facility]N  CM Short Term Goal #3 Start Date  11/12/15   [facility] CM Short Term Goal #3 Met Date  12/12/15

## 2015-12-13 ENCOUNTER — Ambulatory Visit: Payer: Commercial Managed Care - HMO | Admitting: *Deleted

## 2015-12-13 DIAGNOSIS — M5416 Radiculopathy, lumbar region: Secondary | ICD-10-CM | POA: Diagnosis not present

## 2015-12-13 DIAGNOSIS — M5136 Other intervertebral disc degeneration, lumbar region: Secondary | ICD-10-CM | POA: Diagnosis not present

## 2015-12-16 ENCOUNTER — Other Ambulatory Visit: Payer: Self-pay | Admitting: *Deleted

## 2015-12-16 NOTE — Patient Outreach (Signed)
[facility]) Care Management  12/16/2015  Bethany [name] 10/07/1935 [id number]   Telephone assessment   Placed telephone call to BethanyLees to follow up whether she had heard form  MD office regarding outpatient physical therapy, she states that she hasn't, [employer] information verified.  Placed care coordination call to Dr.Hande office regarding referral [facility], spoke with office receptionist that will send message to nurse, able to provide my contact information.   Plan  Will follow up with patient later this week, and close case to community and transition health coach once physical therapy referral process completed.  Egbert GaribaldiKimberly Reeshemah Nazaryan, RN, [facility] Care Management [phone]- Mobile [phone]- Toll Free Main Office

## 2015-12-19 ENCOUNTER — Encounter: Payer: Self-pay | Admitting: *Deleted

## 2015-12-19 ENCOUNTER — Other Ambulatory Visit: Payer: Self-pay | Admitting: *Deleted

## 2015-12-19 DIAGNOSIS — G8929 Other chronic pain: Secondary | ICD-10-CM

## 2015-12-19 DIAGNOSIS — M549 Dorsalgia, unspecified: Secondary | ICD-10-CM

## 2015-12-19 DIAGNOSIS — J441 Chronic obstructive pulmonary disease with (acute) exacerbation: Secondary | ICD-10-CM

## 2015-12-19 DIAGNOSIS — I10 Essential (primary) hypertension: Secondary | ICD-10-CM

## 2015-12-19 DIAGNOSIS — Z9181 History of falling: Secondary | ICD-10-CM

## 2015-12-19 NOTE — Patient Outreach (Signed)
[facility]) Care Management  12/19/2015  Bethany [name] 05/02/1936 [id number]   Telephone call to Bethany [name], [employer] identifiers verified, Patient reports that she is doing well on today, denies shortness of breath or cough. Bethany [name] denies have a fall since last visit, patient discussed that she has received a call regarding outpatient physical therapy and has an appointment on next week. Patient voiced being eager to get started the therapy so that she can progress on to pulmonary rehab. Bethany [name] discussed that she has a Education officer, communitydentist appointment for [facility] teeth [facility] [employer], her friend will provide transportation and patient has extra oxygen tanks to take for the trip.  Bethany [name] denies any new care management needs, and is agreeable to transitioning to health coach for continued education and management of COPD. Bethany [name] has consistent MD follow up with her PCP and pain clinic, she has not experienced a hospital admission in the last 90 days. Patient manages her medications well. Bethany [name] is a high fall risk due to recent falls, she will begin outpatient therapy and will be able to drive herself, reinforced use of fall prevention strategies. Bethany [name] is able to teachback yellow zone symptoms and action plan, will benefit from continued education and management.of chronic COPD   Plan Will place order for transition to health coach for continued education and management of COPD Will notify MD of care management case closure   Egbert GaribaldiKimberly Printice Hellmer, RN, [facility] Care Management [phone]- Mobile [phone]- Toll Free Main Office

## 2015-12-23 ENCOUNTER — Other Ambulatory Visit: Payer: Self-pay | Admitting: Internal Medicine

## 2015-12-23 ENCOUNTER — Other Ambulatory Visit: Payer: Self-pay | Admitting: *Deleted

## 2015-12-23 DIAGNOSIS — Z1231 Encounter for screening mammogram for malignant neoplasm of breast: Secondary | ICD-10-CM

## 2015-12-23 NOTE — Patient Outreach (Signed)
[facility]) Care Management  12/23/2015  Bethany OchoaShirley J Mcclean 05/11/1936 [id number]  [facility] welcome introduction letter sent. Plan: RN will call patient  Within the month of august  Gean MaidensFrances Lorrayne Ismael BSN RN [facility] Care Management [phone]

## 2015-12-24 ENCOUNTER — Ambulatory Visit: Payer: Commercial Managed Care - HMO | Attending: Internal Medicine

## 2015-12-24 VITALS — BP 180/104

## 2015-12-24 DIAGNOSIS — R296 Repeated falls: Secondary | ICD-10-CM | POA: Insufficient documentation

## 2015-12-24 DIAGNOSIS — Z9181 History of falling: Secondary | ICD-10-CM | POA: Insufficient documentation

## 2015-12-24 NOTE — Therapy (Signed)
[facility] PHYSICAL AND SPORTS MEDICINE [address] [place], [address] Phone: [phone]   Fax:  [phone]  Physical Therapy Screen  Patient Details  Name: Bethany [name] MRN: [id number] Date of Birth: [birth year] Referring Provider: Alan Mulder, MD  Encounter Date: 12/24/2015      PT End of Session - 12/24/15 1557    Visit Number 1   Authorization Type --   Authorization Time Period --   PT Start Time 1557   PT Stop Time 1651  Part of session time spent helping patient go back to her car to shut the sunroof secondary to rain.    PT Time Calculation (min) 54 min   Activity Tolerance Patient tolerated treatment well   Behavior During Therapy WFL for tasks assessed/performed      Past Medical History  Diagnosis Date  . Anxiety   . Allergy   . COPD (chronic obstructive pulmonary disease) (HCC)   . Oxygen deficiency   . Hypertension   . Hyperlipidemia   . Arthritis     Past Surgical History  Procedure Laterality Date  . Abdominal hysterectomy    . Spine surgery      Back surgery x8    Filed Vitals:   12/24/15 1600 12/24/15 1633 12/24/15 1637  BP:  198/92 180/104  SpO2: 92%           Subjective Assessment - 12/24/15 1600    Subjective Back pain (chronic): 2-3/10 currently (pt sitting), 8/10 at most for the past 4 weeks (felt better after getting a steroid shot for her back last week). R knee pain: 0/10 R knee currently. 3/10 R knee pain at most for the past 2 weeks.    Pertinent History Ferquent falls. Pt states that she falls due to dizziness the last time, when she bends her head down then up. Gets a "woozy" feeling. Sometimes the room feels like its spinning. Has fallen 3 other times without dizziness. The first time she fell, she hit her R knee. Pt was climbing up a 2 step ladder and missed a step on Oct 18, 2015. Pt fell 2 weeks later, when she went to the bathroom, and missed sitting on the  commode. The light was not on but a night light was present. 2 weeks after that, pt fell again missing the commode in the other bathroom and hit the back of her head. The last time, pt fell going down the steps of her garage and hit the front R side of her head about a week ago. Head is still sore. Has not seen a doctor for the times she hit her head. Pt denies headaches. Pt thinks she missed a step. Was holding a rail but not tight enough. Pt adds that she was also distracted when walking down the steps.  Pt also currently using an oxygen tank since March 2016. Uses 2 - 3 liters.  Was not using her O2 tank during her falls. Pt states that sometimes she feels like she needs the O2 tank, other times she feels like she doesn't.  Pt states that she takes metoprolol for her blood pressure and took it earlier today.    Patient Stated Goals Pt expresses desire to get better.    Currently in Pain? Yes   Pain Score 3   back pain   Pain Location --  low back, and R knee   Pain Descriptors / Indicators Aching;Burning  ache and burning for back;  stabbing R knee and numbness R leg and foot   Pain Type Chronic pain;Acute pain  Acute R knee; Chronic low back pain   Pain Onset More than a month ago   Aggravating Factors  Back pain: sitting, or walking too long. Twisting. Lifting. R knee: getting down on her knee, twisting her R knee (medial knee pain). Balance: when patient looks down then looks up real quick (her eyes feel like they go to the L)             [facility] - 12/24/15 1623    Assessment   Medical Diagnosis Falls frequently   Referring Provider Alan MulderVishwanath Handattur Hande, MD   Onset Date/Surgical Date 10/18/15  First fall   Prior Therapy No known physical therapy for current condition   Precautions   Precaution Comments Fall Risk, on 3 L of O2   Restrictions   Other Position/Activity Restrictions Fall risk, on 3 L with O2    Balance Screen   Has the patient fallen in the past 6  months Yes   How many times? 4   Has the patient had a decrease in activity level because of a fear of falling?  Yes   Is the patient reluctant to leave their home because of a fear of falling?  No  Pt has fear of falling   Home Environment   Living Environment Private residence  Condominium   Living Arrangements Alone   Additional Comments 6 steps to enter, L rail. 1 step in garage with bilateral rails   Prior Function   Vocation Retired   [employer] Require[employer] PLOF: better able to walk, perform chores, perform yardwork   Observation/Other Assessments   Observations Slight difficulty maintaining eyes at a fixed object while turning her head (slight nystagmus) L and R directions. Slight nystagmus when eyes were following PT finger L and R   Modified [place] 40%   Lower Extremity Functional Scale  40/80   Ambulation/Gait   Gait Comments Forward flexed, R lateral lean during R LE stance phase, decreased base of support       Part of session time spent helping patient go back to her car to shut the sunroof secondary to oncoming rain. Pt out of breath afterwards. SpO2 92% on 3 L.  Physical therapy postponed secondary blood pressure levels not appropriate for physical therapy exercise and activities. Pt was recommended to see her doctor or nurse pertaining to her aforementioned vitals. Pt was recommended to use her O2 secondary to her previous falls involved her not using her O2.  Pt was also instructed to make sure the nasal cannula tube is out of her way when she walks as to decrease her fall risk when using her O2. Pt verbalized understanding.                                   Plan - 12/24/15 1651    Clinical Impression Statement Patient is a 80 year old female who came to physical therapy secondary to frequent falls. Vitals measured secondary to her medical history. Pt blood pressure L arm sitting: 198/92 (taken mechanically), and 180/104 (taken manually).  Physical therapy evaluation postponed secondary to patient being referred back to her doctor or nurse due to elevated blood pressure levels not appropriate for physical therapy exercises and activities at this time. Physical therapy to begin when vitals are at a more appropriate level for  therapeutic exercises and activities.     Consulted and Agree with Plan of Care Patient      Patient will benefit from skilled therapeutic intervention in order to improve the following deficits and impairments:     Visit Diagnosis: History of falling  Repeated falls     Problem List Patient Active Problem List   Diagnosis Date Noted  . Back pain, chronic 09/13/2015  . Degenerative arthritis of lumbar spine 06/13/2015  . Low serum cobalamin 03/21/2015  . Anxiety 09/25/2014  . Chronic obstructive pulmonary disease (HCC) 11/13/2013  . BP (high blood pressure) 11/13/2013  . HLD (hyperlipidemia) 11/13/2013   Thank you for your referral.  Loralyn Freshwater PT, DPT   12/24/2015, 7:50 PM  [facility] PHYSICAL AND SPORTS MEDICINE [address], [place], [address] Phone: [phone]   Fax:  [phone]  Name: Bethany [name] MRN: [id number] Date of Birth: May 23, 1936

## 2015-12-27 ENCOUNTER — Other Ambulatory Visit: Payer: Self-pay | Admitting: *Deleted

## 2015-12-27 NOTE — Patient Outreach (Signed)
[facility]) Care Management  12/27/2015  Bethany [name] 09/19/1935 [id number]   Incoming call from [name] , patient discussed her recent visit for her initial outpatient physical therapy, in which her blood pressure was elevated patient reports it was 180/102 at that visit. [name] discussed that she has purchased a blood pressure monitor and request for RN to visit her again to work with on making sure she is using it correctly. Patient reports her readings at home for the last 3 days have been ranging in the 170/80 to 148/78 on yesterday. Patient denies any new symptoms of dizziness, falls headache.  [name] states that therapy department was going to notify Dr.Hande of her blood pressure reading at the visit because she will need to get blood pressure controlled before they can continue with her therapy. [name] reports taking her medications as prescribed. Patient states that she does not have a new appointment scheduled with MD yet, encouraged her to call for a office visit.   Plan Placed call to Dr.Hande office to make sure that they were aware of her recent visit to outpatient physical therapy and blood pressure readings, office closed only after hours message on at 1620. Encourage Mrs.Ardolino to continue to monitor her blood pressure and document  Take her medications , notify MD of continued increases and 911 for emergency. Will plan to contact patient after weekend to schedule home visit, and will notify Gean MaidensFrances Pleasant , Health Coach of this information and place referral to community care management.to assist patient.   Egbert GaribaldiKimberly Erandy Mceachern, RN, [facility] Care Management [phone]- Mobile [phone]- Toll Free Main Office

## 2015-12-30 ENCOUNTER — Encounter: Payer: Self-pay | Admitting: *Deleted

## 2015-12-30 ENCOUNTER — Other Ambulatory Visit: Payer: Self-pay | Admitting: *Deleted

## 2015-12-30 DIAGNOSIS — I70212 Atherosclerosis of native arteries of extremities with intermittent claudication, left leg: Secondary | ICD-10-CM | POA: Diagnosis not present

## 2015-12-30 DIAGNOSIS — I10 Essential (primary) hypertension: Secondary | ICD-10-CM | POA: Diagnosis not present

## 2015-12-30 DIAGNOSIS — I82409 Acute embolism and thrombosis of unspecified deep veins of unspecified lower extremity: Secondary | ICD-10-CM | POA: Diagnosis not present

## 2015-12-30 DIAGNOSIS — J441 Chronic obstructive pulmonary disease with (acute) exacerbation: Secondary | ICD-10-CM

## 2015-12-30 DIAGNOSIS — J9620 Acute and chronic respiratory failure, unspecified whether with hypoxia or hypercapnia: Secondary | ICD-10-CM | POA: Diagnosis not present

## 2015-12-30 DIAGNOSIS — E785 Hyperlipidemia, unspecified: Secondary | ICD-10-CM | POA: Diagnosis not present

## 2015-12-30 DIAGNOSIS — I6523 Occlusion and stenosis of bilateral carotid arteries: Secondary | ICD-10-CM | POA: Diagnosis not present

## 2015-12-30 DIAGNOSIS — J449 Chronic obstructive pulmonary disease, unspecified: Secondary | ICD-10-CM | POA: Diagnosis not present

## 2015-12-30 DIAGNOSIS — I739 Peripheral vascular disease, unspecified: Secondary | ICD-10-CM | POA: Diagnosis not present

## 2015-12-30 DIAGNOSIS — I70213 Atherosclerosis of native arteries of extremities with intermittent claudication, bilateral legs: Secondary | ICD-10-CM | POA: Diagnosis not present

## 2015-12-30 NOTE — Patient Outreach (Signed)
[facility]) Care Management  12/30/2015  Bethany [name] May 13, 1936 [id number]  Placed call to patient to follow up regarding her blood pressure and PCP follow up, HIPAA verified. Bethany [name] reports her blood pressure today was 144/79 heart rate 74. Patient asked questions regarding her blood pressure monitor. Bethany [name] denies new complaints on today, denies increased shortness of breath, she is preparing for office visit at vascular center.  Placed call to [place] office to inform of him of patient report of increased blood pressure reading at outpatient physical therapy appointment, I was able to speak with nurse Bethany [name] and she will relay message to MD, and will notify patient if follow up office visit to be scheduled, patient updated.   Plan Will schedule home visit on this week, to assist patient with use of her blood pressure monitor  and assess for further needs. Place referral for transition back to community care manager, case discussed with Bethany [name], Health coach.     [facility] CM Care Plan Problem One   Flowsheet Row Most Recent Value  Care Plan Problem One  Lack of knowledge related to self managment of Hypertension as evidenced by increased blood pressure readings   Role Documenting the Problem One  Care Management Coordinator  Care Plan for Problem One  Active  [facility] Long Term Goal (31-90 days)  Patient will verbalize increased knowledge of Hypertension self care management skills in the next 31 days   [facility] Long Term Goal Start Date  12/30/15  [facility] Long Term Goal Met Date  11/12/15  Interventions for Problem One Long Term Goal  Discussed with patient the importance of keeping blood pressure under control, notifying MD of new concerns  [facility] CM Short Term Goal #1 (0-30 days)  Patient will be able to return   demonostrate proper use of blood pressure monitor in the next 14 days   [facility] CM Short Term Goal #1 Start Date  12/30/15 [goal date restarted]  [facility] CM Short Term  Goal #1 Met Date  11/12/15  Interventions for Short Term Goal #1  RN to schedule home visit to assist with education on [place]or   [facility] CM Short Term Goal #2 (0-30 days)  Patient will monitor blood pressure daily and keep a record in the next 30 days   [facility] CM Short Term Goal #2 Start Date  12/30/15 [goal date restarted]  [facility] CM Short Term Goal #2 Met Date  11/12/15  Interventions for Short Term Goal #2  Discussed with patient importance of keeping a record of reading to track changes in blood pressure,   [facility] CM Short Term Goal #3 (0-30 days)  Patient will be referred to pulmonary rehab program pending MD approval  in the next 30 days  [facility] CM Short Term Goal #3 Start Date  09/13/15  [facility] CM Short Term Goal #3 Met Date  10/11/15    [facility] CM Care Plan Problem Two   Flowsheet Row Most Recent Value  Care Plan Problem Two  Hypertension  Role Documenting the Problem Two  Care Management [place] for Problem Two  Not Active  [facility] Long Term Goal (31-90) days  Patient will be able to identify stategies for managing hypertension  [facility] Long Term Goal Start Date  11/12/15  [facility] Long Term Goal Met Date  12/12/15  [facility] CM Short Term Goal #1 (0-30 days)  Patient will identify foods that are high in sodium to limit in diet in the next 30 days  [facility] CM Short Term Goal #1 Start Date  09/13/15  [facility] CM Short Term Goal #1 Met Date   10/11/15  [facility] CM Short Term Goal #2 (0-30 days)  Patient will obtain blood pressure monior and begin to monitor blood pressure at least 3 days a week in the next 30 days  [facility] CM Short Term Goal #2 Start Date  11/12/15  Interventions for Short Term Goal #2  Wrote names of blood pressure monitors and reviewed with patient on internet, patient placed sticky note on her refrigerator     [facility] CM Care Plan Problem Three   Flowsheet Row Most Recent Value  Care Plan Problem Three  Recent fall  Role Documenting the Problem Three  Care Management [place] for Problem Three   Not Active  [facility] Long Term Goal (31-90) days  Patient will be able to report strategies to prevent falls in the next 31 days  [facility] Long Term Goal Start Date  -- [goal date 6/6 updated ]  [facility] Long Term Goal Met Date  12/19/15  [facility] CM Short Term Goal #1 (0-30 days)  Patient will report no falls in the next 30 days  [facility] CM Short Term Goal #1 Start Date  -- [goal date restarted]  Interventions for Short Term Goal #1  Reinforced with patient to make sure  she turns the light on a night   [facility] CM Short Term Goal #2 (0-30 days)  -- [goal updated]  THN CM Short Term Goal #2 Start Date  -- [goal date restarted]  [facility] CM Short Term Goal #2 Met Date  12/19/15  [facility] CM Short Term Goal #3 (0-30 days)  Patient will report being active with walking or chair exercises at least 10 minutes a day in the next 30 days  [facility]N  CM Short Term Goal #3 Start Date  11/12/15  [facility] CM Short Term Goal #3 Met Date  12/12/15    Bethany Draft, RN, [place] Management [phone]- Mobile [phone]- [place]

## 2016-01-01 ENCOUNTER — Other Ambulatory Visit: Payer: Self-pay | Admitting: *Deleted

## 2016-01-01 ENCOUNTER — Encounter: Payer: Self-pay | Admitting: *Deleted

## 2016-01-01 NOTE — Patient Outreach (Signed)
[facility]) Care Management   01/01/2016  Bethany [name] 10-Nov-1935 [id number]  Bethany [name] is an 80 y.o. female  Subjective:  BethanyWilkerson reports that this is a good day for her, breathing in green zone. Patient discussed her blood pressure readings have improved from last week. Patient reports that she has a follow up appointment with PCP regarding her blood pressure. BethanyWilkerson discussed she wants to get her blood pressure under control so she can eventually get to her physical therapy and eventually pulmonary rehab. Patient denies having a recent fall,states she is being more careful and cautious.  Objective: Blood pressure 128/78, pulse 72, resp. rate 20, weight 160 lb 6.4 oz (72.8 kg), SpO2 98 %.  Review of Systems  Constitutional: Negative.   HENT: Negative.   Eyes: Negative.   Respiratory: Negative.   Cardiovascular: Negative.   Gastrointestinal: Negative.   Genitourinary: Negative.   Musculoskeletal: Positive for back pain.  Skin: Negative.   Neurological: Negative.  Negative for dizziness.  Endo/Heme/Allergies: Negative.   Psychiatric/Behavioral: Negative.     Physical Exam  Constitutional: She is oriented to person, place, and time. She appears well-developed and well-nourished.  Cardiovascular: Normal rate, normal heart sounds and intact distal pulses.   Respiratory: Effort normal.  GI: Soft.  Musculoskeletal:  Uses cane mostly, uses rolling walker when outside  Neurological: She is alert and oriented to person, place, and time.  Skin: Skin is warm and dry.  Psychiatric: She has a normal mood and affect. Her behavior is normal. Judgment and thought content normal.    Encounter Medications:   Outpatient Encounter Prescriptions as of 01/01/2016  Medication Sig  . albuterol (PROVENTIL HFA;VENTOLIN HFA) 108 (90 Base) MCG/ACT inhaler Inhale 2 puffs into the lungs every 6 (six) hours as needed for wheezing or shortness of breath.  Marland Kitchen aspirin (ASPIRIN  EC) 81 MG EC tablet Take 81 mg by mouth daily. Swallow whole.  . baclofen (LIORESAL) 10 MG tablet Take 10 mg by mouth 3 (three) times daily.  . cetirizine (ZYRTEC) 10 MG tablet Take 10 mg by mouth daily.  . Cyanocobalamin (VITAMIN B 12 PO) Take 1,000 mcg by mouth daily.  . diazepam (VALIUM) 5 MG tablet Take 5 mg by mouth every 6 (six) hours as needed for anxiety. Patient takes once at night Reported on 10/16/2015  . docusate sodium (COLACE) 100 MG capsule Take 100 mg by mouth 2 (two) times daily as needed for mild constipation.  Marland Kitchen escitalopram (LEXAPRO) 5 MG tablet Take 5 mg by mouth daily.  . ferrous sulfate 325 (65 FE) MG tablet Take 325 mg by mouth daily with breakfast.  . Fluticasone-Salmeterol (ADVAIR) 250-50 MCG/DOSE AEPB Inhale 1 puff into the lungs 2 (two) times daily.  Marland Kitchen gabapentin (NEURONTIN) 300 MG capsule Take 300 mg by mouth 3 (three) times daily.  Marland Kitchen HYDROcodone-acetaminophen (NORCO/VICODIN) 5-325 MG tablet Take 1 tablet by mouth 5 (five) times daily as needed for moderate pain. Do not exceed 5 times daily, patient at least 3 day  . metoprolol succinate (TOPROL-XL) 100 MG 24 hr tablet Take 100 mg by mouth daily. Take with or immediately following a meal.  . montelukast (SINGULAIR) 10 MG tablet Take 10 mg by mouth at bedtime. Reported on 12/12/2015  . psyllium (REGULOID) 0.52 g capsule Take 0.52 g by mouth daily. Takes 2  tablets at bedtime , fiber therapy  . tamsulosin (FLOMAX) 0.4 MG CAPS capsule Take 0.4 mg by mouth daily after supper.  . vitamin B-12 (  CYANOCOBALAMIN) 1000 MCG tablet Take 1,000 mcg by mouth daily.  Marland Kitchen lovastatin (MEVACOR) 20 MG tablet Take 20 mg by mouth at bedtime. Reported on 12/12/2015  . naproxen (NAPROSYN) 250 MG tablet Take 1 tablet (250 mg total) by mouth 2 (two) times daily with a meal. (Patient not taking: Reported on 10/16/2015)   No facility-administered encounter medications on file as of 01/01/2016.     Functional Status:   In your present state of health,  do you have any difficulty performing the following activities: 01/01/2016 10/16/2015  Hearing? N N  Vision? N N  Difficulty concentrating or making decisions? Y N  Walking or climbing stairs? Y Y  Dressing or bathing? N N  Doing errands, shopping? N N  Preparing Food and eating ? N N  Using the Toilet? N N  In the past six months, have you accidently leaked urine? Y N  Do you have problems with loss of bowel control? N N  Managing your Medications? N N  Managing your Finances? N N  Housekeeping or managing your Housekeeping? [name]  Some recent data might be hidden    Fall/Depression Screening:    PHQ 2/9 Scores 01/01/2016 12/12/2015 11/12/2015 10/16/2015 09/13/2015  PHQ - 2 Score 0 0 0 0 0    Assessment:  Routine home visit.  COPD- denies episodes of yellow zone symptoms, reviewed importance of adhering to action plan.  Hypertension - Patient has blood pressure  monitor, follow up on education on proper use. Patient will benefit from education on low salt foods, patient discussed she has recently started buying more frozen meals, reviewed labels on food.                      Falls- patient denies having recent fall, reinforced education and use of cane in home.  Patient will benefit from having a bedside commode to place over her toilet to ease rising from commode.   Plan:  Provided and reviewed  EMI handout on COPD when to get help Care planning and patient centered goals developed Hypertension education EMMI handouts on What you can do and keeping a eye on blood pressure . Patient will continue to monitor blood pressures and record, patient will ask MD for blood pressure goals at visit this week. Placed call to MD office regarding ordering a bedside commode for patient.  Will follow telephonically in 2 weeks regarding blood pressure reading, and schedule home visit within next month.    [facility] CM Care Plan Problem One   Flowsheet Row Most Recent Value  Care Plan Problem One  Lack of  knowledge related to self managment of Hypertension as evidenced by increased blood pressure readings   Role Documenting the Problem One  Care Management [place] for Problem One  Active  THN Long Term Goal (31-90 days)  Patient will verbalize increased knowledge of Hypertension self care management skills in the next 31 days   THN Long Term Goal Start Date  12/30/15  Interventions for Problem One Long Term Goal  Discussed with patient the importance of keeping blood pressure under control, notifying MD of new concerns  THN CM Short Term Goal #1 (0-30 days)  Patient will be able to return   demonostrate proper use of blood pressure monitor in the next 14 days   THN CM Short Term Goal #1 Start Date  12/30/15 [goal date restarted]  [facility] CM Short Term Goal #1 Met Date  (P) 01/01/16  THN CM Short Term Goal #2 (0-30 days)  Patient will monitor blood pressure daily and keep a record in the next 30 days   THN CM Short Term Goal #2 Start Date  12/30/15 [goal date restarted]  Interventions for Short Term Goal #2  (P) Encouraged to continue to monitor blood pressure and record, ask MD about blood pressure goal and when to call, Reviewed EMMI handout on Hypertension what to do,  THN CM Short Term Goal #3 (0-30 days)  (P) Patient will be able to identify at least 3 low salt food choices in the next 30 days   THN CM Short Term Goal #3 Start Date  (P) 01/01/16  Interventions for Short Tern Goal #3  (P) Reviewed  handout on low salt foods/high salt   THN CM Short Term Goal #4 (0-30 days)  (P) Patient will be able to identify sodium content in a serving on food label in the next 30 days   THN CM Short Term Goal #4 Start Date  (P) 01/01/16  Interventions for Short Term Goal #4  (P) Reviewed picture handout of a label and reviewed label on food in her refigerator , and standard sodium recommendations of 2 grams of sodium for low salt diet.     [facility] CM Care Plan Problem Two   Flowsheet Row Most Recent  Value  Care Plan Problem Two  (P) Knowledge deficit related to chronic management of COPD  Role Documenting the Problem Two  (P) Care Management Coordinator  Care Plan for Problem Two  (P) Active  Interventions for Problem Two Long Term Goal   (P) Discussed importance of taking medication , notifying MD of new or worsening symptoms   THN Long Term Goal (31-90) days  (P) Patient will be able to verbalize increased knowledge of management of COPD in the next 31 days   THN Long Term Goal Start Date  (P) 01/01/16  THN CM Short Term Goal #1 (0-30 days)  (P) Pateint will be able to continue to state at least 3 yellow zone symptoms and action plan  THN CM Short Term Goal #1 Start Date  (P) 01/01/16  Interventions for Short Term Goal #2   (P) Reviewed EMMI handout on COPD when to call   THN CM Short Term Goal #2 (0-30 days)  (P) Patient will report walking at least 10 minutes 3 days a week in the next 30 days   THN CM Short Term Goal #2 Start Date  (P) 01/01/16  Interventions for Short Term Goal #2  (P) Discussed with pateint how exercise improves overall strength endurance and strengthens respiratory muscles .     [facility] CM Care Plan Problem Three   Flowsheet Row Most Recent Value  THN Long Term Goal Start Date  -- [goal date 6/6 updated ]  THN CM Short Term Goal #1 Start Date  -- [goal date restarted]  THN CM Short Term Goal #2 (0-30 days)  -- [goal updated]  [facility] CM Short Term Goal #2 Start Date  -- [goal date restarted]    Joylene Draft, RN, [place] Care Management [phone]- Mobile [phone]- Toll Free Main Office

## 2016-01-09 ENCOUNTER — Other Ambulatory Visit: Payer: Self-pay | Admitting: Internal Medicine

## 2016-01-09 ENCOUNTER — Ambulatory Visit
Admission: RE | Admit: 2016-01-09 | Discharge: 2016-01-09 | Disposition: A | Discharge status: Home / Self Care | Payer: Commercial Managed Care - HMO | Source: Ambulatory Visit | Attending: Internal Medicine | Admitting: Internal Medicine

## 2016-01-09 DIAGNOSIS — Z1231 Encounter for screening mammogram for malignant neoplasm of breast: Secondary | ICD-10-CM | POA: Insufficient documentation

## 2016-01-09 DIAGNOSIS — M150 Primary generalized (osteo)arthritis: Secondary | ICD-10-CM | POA: Diagnosis not present

## 2016-01-09 DIAGNOSIS — I10 Essential (primary) hypertension: Secondary | ICD-10-CM | POA: Diagnosis not present

## 2016-01-09 DIAGNOSIS — J432 Centrilobular emphysema: Secondary | ICD-10-CM | POA: Diagnosis not present

## 2016-01-09 DIAGNOSIS — I739 Peripheral vascular disease, unspecified: Secondary | ICD-10-CM | POA: Diagnosis not present

## 2016-01-09 DIAGNOSIS — Z0000 Encounter for general adult medical examination without abnormal findings: Secondary | ICD-10-CM | POA: Diagnosis not present

## 2016-01-09 DIAGNOSIS — K219 Gastro-esophageal reflux disease without esophagitis: Secondary | ICD-10-CM | POA: Diagnosis not present

## 2016-01-13 ENCOUNTER — Other Ambulatory Visit: Payer: Self-pay | Admitting: *Deleted

## 2016-01-13 NOTE — Patient Outreach (Signed)
[facility]) Care Management  01/13/2016  BRISIA SCHUERMANN Feb 09, 1936 [id number]   Telephone assessment  RNCM placed follow up telephone call to [name], HIPAA verified. Mrs.Crumpler reports she feels pretty good on today. Patient report her breathing is good, continues to wear continuous oxygen at 3 liters nasal cannula. Patient denies having a recent fall, continues to use her cane, she has prescription from MD for bedside commode to use over her commode but states she hasn't purchased yet.    Mrs.Walthers discussed her recent visit to PCP for follow up on her hypertension, reported new adding one new medication, Losartan patient reports taking medications as prescribed, and not adding salt to her foods. Patient reports she is now eating more of the meals on meals she receives daily.  Patient continues to monitor her blood pressures, lowest in the last week is 117/63 and highest 166/83.  Patient has been able to receive information from MD on blood pressure target goal of 137/80 and to call for consistent blood pressures 150/90. Mrs.Gilleland states she has a follow up appointment with PCP later in the month.   Plan Will meet patient for scheduled home visit for August. Patient will continue to monitor salt, notify MD of consistent elevated blood pressures or new symptoms   THN CM Care Plan Problem One   Flowsheet Row Most Recent Value  Care Plan Problem One  Lack of knowledge related to self managment of Hypertension as evidenced by increased blood pressure readings   Role Documenting the Problem One  Care Management Coordinator  Care Plan for Problem One  Active  THN Long Term Goal (31-90 days)  Patient will verbalize increased knowledge of Hypertension self care management skills in the next 31 days   THN Long Term Goal Start Date  12/30/15  Interventions for Problem One Long Term Goal  Reinforced importance of adhering to MD instructions regarding blood pressure control   THN  CM Short Term Goal #1 (0-30 days)  Patient will be able to return   demonostrate proper use of blood pressure monitor in the next 14 days   THN CM Short Term Goal #1 Start Date  12/30/15 [goal date restarted]  [facility] CM Short Term Goal #1 Met Date  01/01/16  THN CM Short Term Goal #2 (0-30 days)  Patient will monitor blood pressure daily and keep a record in the next 30 days   THN CM Short Term Goal #2 Start Date  12/30/15 [goal date restarted]  Interventions for Short Term Goal #2  Patient has received parmeters to call MD , and her target blood pressure goals at MD visit, encouraged to continue to keep a record, and notify MD per recommendations   THN CM Short Term Goal #3 (0-30 days)  Patient will be able to identify at least 3 low salt food choices in the next 30 days   THN CM Short Term Goal #3 Start Date  01/01/16  Interventions for Short Tern Goal #3  Reinforced adherence to lower salt diet recommendations, not adding salt to food  THN CM Short Term Goal #4 (0-30 days)  Patient will be able to identify sodium content in a serving on food label in the next 30 days   THN CM Short Term Goal #4 Start Date  01/01/16  Interventions for Short Term Goal #4  Reviewed picture handout of a label and reviewed label on food in her refigerator , and standard sodium recommendations of 2 grams of sodium for low  salt diet.     [facility] CM Care Plan Problem Two   Flowsheet Row Most Recent Value  Care Plan Problem Two  Knowledge deficit related to chronic management of COPD  Role Documenting the Problem Two  Care Management Coordinator  Care Plan for Problem Two  Active  Interventions for Problem Two Long Term Goal   Discussed importance of taking medication , notifying MD of new or worsening symptoms   THN Long Term Goal (31-90) days  Patient will be able to verbalize increased knowledge of management of COPD in the next 31 days   THN Long Term Goal Start Date  01/01/16  THN CM Short Term Goal #1 (0-30 days)   Pateint will be able to continue to state at least 3 yellow zone symptoms and action plan  THN CM Short Term Goal #1 Start Date  01/01/16  Interventions for Short Term Goal #2   Reviewed EMMI handout on COPD when to call   THN CM Short Term Goal #2 (0-30 days)  Patient will report walking at least 10 minutes 3 days a week in the next 30 days   THN CM Short Term Goal #2 Start Date  01/01/16  Interventions for Short Term Goal #2  Reinforced benefits of activity as tolerated to help with lungs and hypertension .     [facility] CM Care Plan Problem Three   Flowsheet Row Most Recent Value  THN Long Term Goal Start Date  -- [goal date 6/6 updated ]  THN CM Short Term Goal #1 Start Date  -- [goal date restarted]  THN CM Short Term Goal #2 (0-30 days)  -- [goal updated]  [facility] CM Short Term Goal #2 Start Date  -- [goal date restarted]     Joylene Draft, RN, [place] Management [phone]- Mobile [phone]- Toll Free Main Office .

## 2016-01-21 ENCOUNTER — Ambulatory Visit: Payer: Commercial Managed Care - HMO | Admitting: *Deleted

## 2016-01-27 DIAGNOSIS — M5416 Radiculopathy, lumbar region: Secondary | ICD-10-CM | POA: Diagnosis not present

## 2016-01-27 DIAGNOSIS — M4726 Other spondylosis with radiculopathy, lumbar region: Secondary | ICD-10-CM | POA: Diagnosis not present

## 2016-01-27 DIAGNOSIS — M5136 Other intervertebral disc degeneration, lumbar region: Secondary | ICD-10-CM | POA: Diagnosis not present

## 2016-01-27 DIAGNOSIS — R2689 Other abnormalities of gait and mobility: Secondary | ICD-10-CM | POA: Diagnosis not present

## 2016-01-28 ENCOUNTER — Ambulatory Visit: Payer: Self-pay | Admitting: *Deleted

## 2016-01-30 DIAGNOSIS — I10 Essential (primary) hypertension: Secondary | ICD-10-CM | POA: Diagnosis not present

## 2016-01-30 DIAGNOSIS — I82409 Acute embolism and thrombosis of unspecified deep veins of unspecified lower extremity: Secondary | ICD-10-CM | POA: Diagnosis not present

## 2016-01-30 DIAGNOSIS — J9620 Acute and chronic respiratory failure, unspecified whether with hypoxia or hypercapnia: Secondary | ICD-10-CM | POA: Diagnosis not present

## 2016-01-30 DIAGNOSIS — J449 Chronic obstructive pulmonary disease, unspecified: Secondary | ICD-10-CM | POA: Diagnosis not present

## 2016-02-05 ENCOUNTER — Other Ambulatory Visit: Payer: Self-pay | Admitting: *Deleted

## 2016-02-05 ENCOUNTER — Encounter: Payer: Self-pay | Admitting: *Deleted

## 2016-02-05 NOTE — Patient Outreach (Signed)
[facility]) Care Management   02/05/2016  Bethany [name] [birth year] [id number]  Bethany [name] is an 80 y.o. female  Subjective:  Patient states breathing is better than it was a few days ago, reports some wheezing especially in the morning, sputum has been green but it getting lighter, has used rescue inhaler  once daily for the last few days with relief.   Patient reports her blood pressure has been better.  Patient denies having a recent fall, using cane or walker .   Objective:  BP 122/60   Pulse 78   Resp 20   Wt 162 lb 3.2 oz (73.6 kg)   SpO2 98%   BMI 27.84 kg/m    Review of Systems  Constitutional: Negative.   HENT: Negative.   Eyes: Negative.   Respiratory: Positive for cough and shortness of breath.        Short of breath with activity , resolves with rest  Cardiovascular: Negative.   Gastrointestinal: Negative.   Genitourinary: Negative.   Musculoskeletal: Negative.   Skin: Negative.   Neurological: Negative.   Endo/Heme/Allergies: Negative.   Psychiatric/Behavioral: Negative.     Physical Exam  Constitutional: She is oriented to person, place, and time. She appears well-developed and well-nourished.  Cardiovascular: Normal rate, normal heart sounds and intact distal pulses.   Respiratory: She has wheezes.  Noted audible wheezes when walking in home, not noted on exam  GI: Soft.  Neurological: She is alert and oriented to person, place, and time.  Skin: Skin is warm and dry.    Encounter Medications:   Outpatient Encounter Prescriptions as of 02/05/2016  Medication Sig  . albuterol (PROVENTIL HFA;VENTOLIN HFA) 108 (90 Base) MCG/ACT inhaler Inhale 2 puffs into the lungs every 6 (six) hours as needed for wheezing or shortness of breath.  Marland Kitchen aspirin (ASPIRIN EC) 81 MG EC tablet Take 81 mg by mouth daily. Swallow whole.  . baclofen (LIORESAL) 10 MG tablet Take 10 mg by mouth 3 (three) times daily.  . cetirizine (ZYRTEC) 10 MG tablet Take 10  mg by mouth daily.  . Cyanocobalamin (VITAMIN B 12 PO) Take 1,000 mcg by mouth daily.  . diazepam (VALIUM) 5 MG tablet Take 5 mg by mouth every 6 (six) hours as needed for anxiety. Patient takes once at night Reported on 10/16/2015  . docusate sodium (COLACE) 100 MG capsule Take 100 mg by mouth 2 (two) times daily as needed for mild constipation.  Marland Kitchen escitalopram (LEXAPRO) 5 MG tablet Take 5 mg by mouth daily.  . ferrous sulfate 325 (65 FE) MG tablet Take 325 mg by mouth daily with breakfast.  . Fluticasone-Salmeterol (ADVAIR) 250-50 MCG/DOSE AEPB Inhale 1 puff into the lungs 2 (two) times daily.  Marland Kitchen gabapentin (NEURONTIN) 300 MG capsule Take 300 mg by mouth 3 (three) times daily.  Marland Kitchen HYDROcodone-acetaminophen (NORCO/VICODIN) 5-325 MG tablet Take 1 tablet by mouth 5 (five) times daily as needed for moderate pain. Do not exceed 5 times daily, patient at least 3 day  . losartan (COZAAR) 50 MG tablet Take 50 mg by mouth daily.  . metoprolol succinate (TOPROL-XL) 100 MG 24 hr tablet Take 100 mg by mouth daily. Take with or immediately following a meal.  . montelukast (SINGULAIR) 10 MG tablet Take 10 mg by mouth at bedtime. Reported on 12/12/2015  . psyllium (REGULOID) 0.52 g capsule Take 0.52 g by mouth daily. Takes 2  tablets at bedtime , fiber therapy  . tamsulosin (FLOMAX) 0.4 MG CAPS  capsule Take 0.4 mg by mouth daily after supper.  . vitamin B-12 (CYANOCOBALAMIN) 1000 MCG tablet Take 1,000 mcg by mouth daily.  Marland Kitchen lovastatin (MEVACOR) 20 MG tablet Take 20 mg by mouth at bedtime. Reported on 12/12/2015  . naproxen (NAPROSYN) 250 MG tablet Take 1 tablet (250 mg total) by mouth 2 (two) times daily with a meal. (Patient not taking: Reported on 10/16/2015)   No facility-administered encounter medications on file as of 02/05/2016.     Functional Status:   In your present state of health, do you have any difficulty performing the following activities: 01/01/2016 10/16/2015  Hearing? N N  Vision? N N   Difficulty concentrating or making decisions? Y N  Walking or climbing stairs? Y Y  Dressing or bathing? N N  Doing errands, shopping? N N  Preparing Food and eating ? N N  Using the Toilet? N N  In the past six months, have you accidently leaked urine? Y N  Do you have problems with loss of bowel control? N N  Managing your Medications? N N  Managing your Finances? N N  Housekeeping or managing your Housekeeping? [name]  Some recent data might be hidden    Fall/Depression Screening:    PHQ 2/9 Scores 01/01/2016 12/12/2015 11/12/2015 10/16/2015 09/13/2015  PHQ - 2 Score 0 0 0 0 0    Assessment:    COPD-  Patient report her breathing is in her normal zone, reinforced yellow zone symptoms to notify MD of .   Hypertension-  Continues to monitor blood pressures ranges 120-160/70-80. Reminded patient of consistent blood pressure reading of  150/90 to notify MD. Patient has target and blood pressure readings to notify MD of written on her log book. Checked manual reading, in comparison to her blood pressure machine, less than 10 point difference . Patient will benefit on review of proper technique for using home monitor. Patient reports limiting salt in diet.   Fall - Using assist devices, denies having fall, patient walks daily to mail box, wants to eventually begin outpatient therapy when blood pressure better controlled and then pulmonary rehab.    Plan:  Will schedule home visit in month of September Patient will notify MD of worsening or yellow zone COPD symptoms Patient will continue to monitor blood pressure daily, record and notify MD per parameters. Provided EMMI handout on COPD, what to do, and about hypertension.   [facility] CM Care Plan Problem One   Flowsheet Row Most Recent Value  Care Plan Problem One  Lack of knowledge related to self managment of Hypertension as evidenced by increased blood pressure readings   Role Documenting the Problem One  Care Management [place]  for Problem One  Active  THN Long Term Goal (31-90 days)  Patient will verbalize increased knowledge of Hypertension self care management skills in the next 60 days  [Goal revised]  THN Long Term Goal Start Date  02/05/16 [goal date restart ]  Interventions for Problem One Long Term Goal  Reminded patient of blood pressure goals, and when to call MD , she has them written on her calendar, Reviewed EMMI on About Hypertension.   THN CM Short Term Goal #1 (0-30 days)  Patient will be able to return   demonostrate proper use of blood pressure monitor in the next 14 days   THN CM Short Term Goal #1 Start Date  12/30/15 [goal date restarted]  [facility] CM Short Term Goal #1 Met Date  01/01/16  THN CM Short Term Goal #2 (0-30 days)  Patient will monitor blood pressure daily and keep a record in the next 30 days   THN CM Short Term Goal #2 Start Date  12/30/15 [goal date restarted]  Interventions for Short Term Goal #2  Reviewed/Demonstrated importance of positioning when checking blood pressure, being still , waiting 30 minutes after activity to check reading   THN CM Short Term Goal #3 (0-30 days)  Patient will be able to identify at least 3 low salt food choices in the next 30 days   THN CM Short Term Goal #3 Start Date  01/01/16  [facility] CM Short Term Goal #3 Met Date  02/05/16  Interventions for Short Tern Goal #3  Reinforced adherence to lower salt diet recommendations, not adding salt to food  THN CM Short Term Goal #4 (0-30 days)  Patient will be able to identify sodium content in a serving on food label in the next 30 days   THN CM Short Term Goal #4 Start Date  01/01/16  [facility] CM Short Term Goal #4 Met Date  02/05/16    [facility] CM Care Plan Problem Two   Flowsheet Row Most Recent Value  Care Plan Problem Two  Knowledge deficit related to chronic management of COPD  Role Documenting the Problem Two  Care Management Coordinator  Care Plan for Problem Two  Active  Interventions for Problem Two Long Term Goal    Reinforced to notify MD of worsening symptoms to arrange office visit .  THN Long Term Goal (31-90) days  Patient will be able to verbalize increased knowledge of management of COPD in the next 31 days   THN Long Term Goal Start Date  02/05/16 [goal date updated]  THN CM Short Term Goal #1 (0-30 days)  Pateint will be able to continue to state at least 3 yellow zone symptoms and action plan  [facility] CM Short Term Goal #1 Start Date  01/01/16  [facility] CM Short Term Goal #1 Met Date   02/05/16  THN CM Short Term Goal #2 (0-30 days)  Patient will report walking at least 5 minutes 3 days a week in the next 30 days  [Goal revised ]  THN CM Short Term Goal #2 Start Date  02/05/16  Interventions for Short Term Goal #2  Reviewed EMMI handout , COPD what you can do, and benefits of exerise     [facility] CM Care Plan Problem Three   Flowsheet Row Most Recent Value  THN Long Term Goal Start Date  -- [goal date 6/6 updated ]  THN CM Short Term Goal #1 Start Date  -- [goal date restarted]  [facility] CM Short Term Goal #2 (0-30 days)  -- [goal updated]  [facility] CM Short Term Goal #2 Start Date  -- [goal date restarted]    Joylene Draft, RN, [place] Care Management [phone]- Mobile [phone]- [place]

## 2016-02-19 DIAGNOSIS — S51012D Laceration without foreign body of left elbow, subsequent encounter: Secondary | ICD-10-CM | POA: Diagnosis not present

## 2016-02-19 DIAGNOSIS — R42 Dizziness and giddiness: Secondary | ICD-10-CM | POA: Diagnosis not present

## 2016-02-19 DIAGNOSIS — I10 Essential (primary) hypertension: Secondary | ICD-10-CM | POA: Diagnosis not present

## 2016-02-25 ENCOUNTER — Other Ambulatory Visit: Payer: Self-pay | Admitting: *Deleted

## 2016-02-25 NOTE — Patient Outreach (Signed)
[facility]) Care Management   02/25/2016  Bethany [name] 1935-06-12 [id number]  Bethany [name] is an 80 y.o. female  Subjective:  Patient reports she had another fall last week, while standing up from the commode, she is not sure what happened thinks she passed out. Patient discussed she did contact PCP and went in for office visit,. Patient showed RN skin tear area at left ant elbow area, with bruising report site healing reports neighbor originally applied dressing to arm, no dressing needed now.   Patient breathing has been pretty good, wearing oxygen at 3 liters.  Patient reports she hasn't been as faithful at checking her blood pressure, "i have just been lazy,but they have been running better.   Objective:  BP 128/64 (BP Location: Right Arm)   Pulse 63   Resp 20   SpO2 98%  Review of Systems  Constitutional: Negative.   HENT: Negative.   Eyes: Negative.   Respiratory: Negative.   Cardiovascular: Negative.   Gastrointestinal: Negative.   Genitourinary: Negative.   Musculoskeletal: Positive for back pain and falls.  Skin: Negative.   Neurological: Negative.   Endo/Heme/Allergies: Negative.   Psychiatric/Behavioral: Negative.     Physical Exam  Constitutional: She is oriented to person, place, and time. She appears well-developed and well-nourished.  Cardiovascular: Normal rate, normal heart sounds and intact distal pulses.   Respiratory: Effort normal.  GI: Soft.  Neurological: She is alert and oriented to person, place, and time.  Skin: Skin is warm and dry.     Psychiatric: She has a normal mood and affect. Her behavior is normal. Judgment and thought content normal.    Encounter Medications:   Outpatient Encounter Prescriptions as of 02/25/2016  Medication Sig  . albuterol (PROVENTIL HFA;VENTOLIN HFA) 108 (90 Base) MCG/ACT inhaler Inhale 2 puffs into the lungs every 6 (six) hours as needed for wheezing or shortness of breath.  Marland Kitchen aspirin  (ASPIRIN EC) 81 MG EC tablet Take 81 mg by mouth daily. Swallow whole.  . baclofen (LIORESAL) 10 MG tablet Take 10 mg by mouth 3 (three) times daily.  . cetirizine (ZYRTEC) 10 MG tablet Take 10 mg by mouth daily.  . Cyanocobalamin (VITAMIN B 12 PO) Take 1,000 mcg by mouth daily.  . diazepam (VALIUM) 5 MG tablet Take 5 mg by mouth every 6 (six) hours as needed for anxiety. Patient takes once at night Reported on 10/16/2015  . docusate sodium (COLACE) 100 MG capsule Take 100 mg by mouth 2 (two) times daily as needed for mild constipation.  Marland Kitchen escitalopram (LEXAPRO) 5 MG tablet Take 5 mg by mouth daily.  . ferrous sulfate 325 (65 FE) MG tablet Take 325 mg by mouth daily with breakfast.  . Fluticasone-Salmeterol (ADVAIR) 250-50 MCG/DOSE AEPB Inhale 1 puff into the lungs 2 (two) times daily.  Marland Kitchen gabapentin (NEURONTIN) 300 MG capsule Take 300 mg by mouth 3 (three) times daily.  Marland Kitchen HYDROcodone-acetaminophen (NORCO/VICODIN) 5-325 MG tablet Take 1 tablet by mouth 5 (five) times daily as needed for moderate pain. Do not exceed 5 times daily, patient at least 3 day  . losartan (COZAAR) 50 MG tablet Take 50 mg by mouth daily.  Marland Kitchen lovastatin (MEVACOR) 20 MG tablet Take 20 mg by mouth at bedtime. Reported on 12/12/2015  . metoprolol succinate (TOPROL-XL) 100 MG 24 hr tablet Take 100 mg by mouth daily. Take with or immediately following a meal.  . montelukast (SINGULAIR) 10 MG tablet Take 10 mg by mouth at bedtime.  Reported on 12/12/2015  . naproxen (NAPROSYN) 250 MG tablet Take 1 tablet (250 mg total) by mouth 2 (two) times daily with a meal. (Patient not taking: Reported on 10/16/2015)  . psyllium (REGULOID) 0.52 g capsule Take 0.52 g by mouth daily. Takes 2  tablets at bedtime , fiber therapy  . tamsulosin (FLOMAX) 0.4 MG CAPS capsule Take 0.4 mg by mouth daily after supper.  . vitamin B-12 (CYANOCOBALAMIN) 1000 MCG tablet Take 1,000 mcg by mouth daily.   No facility-administered encounter medications on file as of  02/25/2016.     Functional Status:   In your present state of health, do you have any difficulty performing the following activities: 01/01/2016 10/16/2015  Hearing? N N  Vision? N N  Difficulty concentrating or making decisions? Y N  Walking or climbing stairs? Y Y  Dressing or bathing? N N  Doing errands, shopping? N N  Preparing Food and eating ? N N  Using the Toilet? N N  In the past six months, have you accidently leaked urine? Y N  Do you have problems with loss of bowel control? N N  Managing your Medications? N N  Managing your Finances? N N  Housekeeping or managing your Housekeeping? [name]  Some recent data might be hidden    Fall/Depression Screening:    PHQ 2/9 Scores 01/01/2016 12/12/2015 11/12/2015 10/16/2015 09/13/2015  PHQ - 2 Score 0 0 0 0 0   Fall Risk  01/01/2016 12/12/2015 11/12/2015 10/16/2015 10/11/2015  Falls in the past year? Yes Yes Yes Yes Yes  Number falls in past yr: 2 or more 2 or more 2 or more 2 or more 1  Injury with Fall? Yes Yes Yes Yes Yes  Risk Factor Category  High Fall Risk High Fall Risk High Fall Risk High Fall Risk -  Risk for fall due to : Impaired balance/gait;History of fall(s) - Impaired balance/gait;History of fall(s) History of fall(s);Impaired balance/gait History of fall(s)  Risk for fall due to (comments): - - - - -  Follow up Falls evaluation completed;Falls prevention discussed Falls prevention discussed;Education provided Falls evaluation completed;Education provided;Falls prevention discussed Education provided;Falls prevention discussed;Falls evaluation completed Falls prevention discussed   Assessment:  Routine home visit.   Fall Risk= patient remains high fall risk, benefit from continued education on fall prevention ,patient goal to be able to start outpatient physical therapy after PCP visit in October. Patient will benefit from medical alert system for safety feature  and she is interested.    COPD- usual breathing pattterns, not increase  in shortness, cough or sputum production . Will get Flu shot at PCP visit in October.   Hypertension-  Checking blood pressure about weekly, ranging in the 130-140/80 . Limiting salt in food.   Plan:  Will plan return home visit in October   collaborate on patient centered goal setting and update.   [facility] CM Care Plan Problem One   Flowsheet Row Most Recent Value  Care Plan Problem One  Lack of knowledge related to self managment of Hypertension as evidenced by increased blood pressure readings   Role Documenting the Problem One  Care Management [place] for Problem One  Active  [facility] Long Term Goal (31-90 days)  Patient will verbalize increased knowledge of Hypertension self care management skills in the next 60 days  [Goal revised]  [facility] Long Term Goal Start Date  02/05/16 [goal date restart ]  [facility] Long Term Goal Met Date  02/25/16  [facility] CM Short Term Goal #1 (0-30 days)  Patient will be able to return   demonostrate proper use of blood pressure monitor in the next 14 days   [facility] CM Short Term Goal #1 Start Date  12/30/15 [goal date restarted]  [facility] CM Short Term Goal #1 Met Date  01/01/16  [facility] CM Short Term Goal #2 (0-30 days)  Patient will monitor blood pressure at least 3 days week and keep a record in the next 30 days  [goal restated ]  [facility] CM Short Term Goal #2 Start Date  02/25/16 [goal date restarted]  Interventions for Short Term Goal #2  Discussed importance of monitoring blood pressure,and keeping a record  [facility] CM Short Term Goal #3 (0-30 days)  Patient will be able to identify at least 3 low salt food choices in the next 30 days   [facility] CM Short Term Goal #3 Start Date  01/01/16  [facility] CM Short Term Goal #3 Met Date  02/05/16  [facility] CM Short Term Goal #4 (0-30 days)  Patient will be able to identify sodium content in a serving on food label in the next 30 days   [facility] CM Short Term Goal #4 Start Date  01/01/16  [facility] CM Short Term Goal #4 Met Date  02/05/16    [facility] CM Care Plan  Problem Two   Flowsheet Row Most Recent Value  Care Plan Problem Two  Knowledge deficit related to chronic management of COPD  Role Documenting the Problem Two  Care Management Coordinator  Care Plan for Problem Two  Active  [facility] Long Term Goal (31-90) days  Patient will be able to verbalize increased knowledge of management of COPD in the next 31 days   [facility] Long Term Goal Start Date  02/05/16 [goal date updated]  [facility] Long Term Goal Met Date  02/25/16  [facility] CM Short Term Goal #1 (0-30 days)  Pateint will be able to continue to state at least 3 yellow zone symptoms and action plan  [facility] CM Short Term Goal #1 Start Date  01/01/16  [facility] CM Short Term Goal #1 Met Date   02/05/16  [facility] CM Short Term Goal #2 (0-30 days)  Patient will report walking at least 5 minutes 3 days a week in the next 30 days  [Goal revised ]  [facility] CM Short Term Goal #2 Start Date  02/05/16 [goal date restart ]  Interventions for Short Term Goal #2  Discussed importance of activity as tolerated, to with balance and strenght     [facility] CM Care Plan Problem Three   Flowsheet Row Most Recent Value  Care Plan Problem Three  High fall risk as evidenced by recent fall   Role Documenting the Problem Three  Care Management Coordinator  Care Plan for Problem Three  Active  [facility] Long Term Goal (31-90) days  Patient will be able to report strategies to prevent falls in the next 31 days  [facility] Long Term Goal Start Date  02/25/16 Barrie Folk date 6/6 updated ]  Interventions for Problem Three Long Term Goal  Discussed importance adhering to fall preventiion measures, to prevent injury,when standing stay still for at least 10 seconds before starting to walk,  and notify MD of occurences   [facility] CM Short Term Goal #1 (0-30 days)  Patient will report no falls in the next 30 days  [facility] CM Short Term Goal #1 Start Date  02/25/16 [goal date restarted]  Interventions for Short Term Goal #1  Reinforced with patient to make sure  she turns the light on a night     [facility] CM Short Term Goal #2 (0-30 days)  Patient will report using her walker or cane at all times in the next 30 days [goal updated]  [facility] CM Short Term Goal #2 Start Date  02/25/16 [goal date restarted]  Interventions for Short Term Goal #2  Reinforced with patient to use cane in home to help with fall prevention to help with balance  [facility] CM Short Term Goal #3 (0-30 days)  Patient will report obtaining medical alert system in the next 30 days   [facility]  CM Short Term Goal #3 Start Date  02/25/16  Interventions for Short Term Goal #3  Placed call to [facility] to request information for patient on their alert system, able to leave a message.      Joylene Draft, RN, [place] [facility] [phone]- Mobile [phone]- Toll Free Main Office

## 2016-03-01 DIAGNOSIS — I10 Essential (primary) hypertension: Secondary | ICD-10-CM | POA: Diagnosis not present

## 2016-03-01 DIAGNOSIS — I82409 Acute embolism and thrombosis of unspecified deep veins of unspecified lower extremity: Secondary | ICD-10-CM | POA: Diagnosis not present

## 2016-03-01 DIAGNOSIS — J449 Chronic obstructive pulmonary disease, unspecified: Secondary | ICD-10-CM | POA: Diagnosis not present

## 2016-03-01 DIAGNOSIS — J9620 Acute and chronic respiratory failure, unspecified whether with hypoxia or hypercapnia: Secondary | ICD-10-CM | POA: Diagnosis not present

## 2016-03-20 DIAGNOSIS — M5442 Lumbago with sciatica, left side: Secondary | ICD-10-CM | POA: Diagnosis not present

## 2016-03-20 DIAGNOSIS — M25561 Pain in right knee: Secondary | ICD-10-CM | POA: Diagnosis not present

## 2016-03-20 DIAGNOSIS — M5416 Radiculopathy, lumbar region: Secondary | ICD-10-CM | POA: Diagnosis not present

## 2016-03-20 DIAGNOSIS — R8290 Unspecified abnormal findings in urine: Secondary | ICD-10-CM | POA: Diagnosis not present

## 2016-03-20 DIAGNOSIS — J432 Centrilobular emphysema: Secondary | ICD-10-CM | POA: Diagnosis not present

## 2016-03-20 DIAGNOSIS — G8929 Other chronic pain: Secondary | ICD-10-CM | POA: Diagnosis not present

## 2016-03-20 DIAGNOSIS — R296 Repeated falls: Secondary | ICD-10-CM | POA: Diagnosis not present

## 2016-03-20 DIAGNOSIS — K219 Gastro-esophageal reflux disease without esophagitis: Secondary | ICD-10-CM | POA: Diagnosis not present

## 2016-03-20 DIAGNOSIS — F419 Anxiety disorder, unspecified: Secondary | ICD-10-CM | POA: Diagnosis not present

## 2016-03-20 DIAGNOSIS — I10 Essential (primary) hypertension: Secondary | ICD-10-CM | POA: Diagnosis not present

## 2016-03-23 ENCOUNTER — Emergency Department: Payer: Commercial Managed Care - HMO

## 2016-03-23 ENCOUNTER — Emergency Department
Admission: EM | Admit: 2016-03-23 | Discharge: 2016-03-23 | Disposition: A | Discharge status: Home / Self Care | Payer: Commercial Managed Care - HMO | Attending: Emergency Medicine | Admitting: Emergency Medicine

## 2016-03-23 ENCOUNTER — Encounter: Payer: Self-pay | Admitting: Emergency Medicine

## 2016-03-23 DIAGNOSIS — R0602 Shortness of breath: Secondary | ICD-10-CM | POA: Diagnosis not present

## 2016-03-23 DIAGNOSIS — Z791 Long term (current) use of non-steroidal anti-inflammatories (NSAID): Secondary | ICD-10-CM | POA: Insufficient documentation

## 2016-03-23 DIAGNOSIS — Z87891 Personal history of nicotine dependence: Secondary | ICD-10-CM | POA: Diagnosis not present

## 2016-03-23 DIAGNOSIS — J441 Chronic obstructive pulmonary disease with (acute) exacerbation: Secondary | ICD-10-CM | POA: Insufficient documentation

## 2016-03-23 DIAGNOSIS — Z7982 Long term (current) use of aspirin: Secondary | ICD-10-CM | POA: Insufficient documentation

## 2016-03-23 DIAGNOSIS — I10 Essential (primary) hypertension: Secondary | ICD-10-CM | POA: Diagnosis not present

## 2016-03-23 DIAGNOSIS — Z79899 Other long term (current) drug therapy: Secondary | ICD-10-CM | POA: Diagnosis not present

## 2016-03-23 DIAGNOSIS — Z7951 Long term (current) use of inhaled steroids: Secondary | ICD-10-CM | POA: Insufficient documentation

## 2016-03-23 DIAGNOSIS — R05 Cough: Secondary | ICD-10-CM | POA: Diagnosis not present

## 2016-03-23 LAB — BASIC METABOLIC PANEL
ANION GAP: 10 (ref 5–15)
BUN: 15 mg/dL (ref 6–20)
CALCIUM: 9.6 mg/dL (ref 8.9–10.3)
CO2: 26 mmol/L (ref 22–32)
Chloride: 107 mmol/L (ref 101–111)
Creatinine, Ser: 0.98 mg/dL (ref 0.44–1.00)
GFR, EST NON AFRICAN AMERICAN: 54 mL/min — AB (ref >60)
Glucose, Bld: 102 mg/dL — ABNORMAL HIGH (ref 65–99)
Potassium: 4.1 mmol/L (ref 3.5–5.1)
SODIUM: 143 mmol/L (ref 135–145)

## 2016-03-23 LAB — CBC
HCT: 38.4 % (ref 35.0–47.0)
HEMOGLOBIN: 13 g/dL (ref 12.0–16.0)
MCH: 31.3 pg (ref 26.0–34.0)
MCHC: 33.8 g/dL (ref 32.0–36.0)
MCV: 92.8 fL (ref 80.0–100.0)
PLATELETS: 220 10*3/uL (ref 150–440)
RBC: 4.14 MIL/uL (ref 3.80–5.20)
RDW: 13.5 % (ref 11.5–14.5)
WBC: 6.1 10*3/uL (ref 3.6–11.0)

## 2016-03-23 MED ORDER — AZITHROMYCIN 250 MG PO TABS: ORAL_TABLET | ORAL | 0 refills | Status: DC

## 2016-03-23 MED ORDER — ALBUTEROL SULFATE HFA 108 (90 BASE) MCG/ACT IN AERS: 2.0000 | INHALATION_SPRAY | Freq: Four times a day (QID) | RESPIRATORY_TRACT | 2 refills | Status: AC | PRN

## 2016-03-23 MED ORDER — PREDNISONE 10 MG (21) PO TBPK: ORAL_TABLET | ORAL | 0 refills | Status: DC

## 2016-03-23 MED ORDER — IPRATROPIUM-ALBUTEROL 0.5-2.5 (3) MG/3ML IN SOLN
3.0000 mL | Freq: Once | RESPIRATORY_TRACT | Status: AC
  Administered 2016-03-23: 3 mL via RESPIRATORY_TRACT
  Filled 2016-03-23: qty 3

## 2016-03-23 NOTE — ED Triage Notes (Signed)
Pt to ed with c/o sob that started yesterday,  Pt states productive green sputum cough x 3 days.  Pt denies fever, but reports she feels dizzy when she stands. Pt sats 97% at 3lpm, pt with regular respirations, appears in no respiratory distress at this time.

## 2016-03-23 NOTE — ED Provider Notes (Signed)
[facility] Emergency Department Provider Note        Time seen: ----------------------------------------- 10:37 AM on 03/23/2016 -----------------------------------------    I have reviewed the triage vital signs and the nursing notes.   HISTORY  Chief Complaint Shortness of Breath    HPI Bethany [name] is a 80 y.o. female who presents to the ER for shortness of breath that started yesterday. Patient's had productive green sputum in her cough for 3 days but has been coughing for several weeks now. Patient denies fever and reports she feels dizzy when she stands. She wears 3 L of oxygen all the time, nothing makes her symptoms better or worse. She denies fevers, chills or other complaints.   Past Medical History:  Diagnosis Date  . Allergy   . Anxiety   . Arthritis   . COPD (chronic obstructive pulmonary disease) (HCC)   . Hyperlipidemia   . Hypertension   . Oxygen deficiency     Patient Active Problem List   Diagnosis Date Noted  . Back pain, chronic 09/13/2015  . Degenerative arthritis of lumbar spine 06/13/2015  . Low serum cobalamin 03/21/2015  . Anxiety 09/25/2014  . Chronic obstructive pulmonary disease (HCC) 11/13/2013  . BP (high blood pressure) 11/13/2013  . HLD (hyperlipidemia) 11/13/2013    Past Surgical History:  Procedure Laterality Date  . ABDOMINAL HYSTERECTOMY    . SPINE SURGERY     Back surgery x8    Allergies Codeine; Penicillins; and Sulfa antibiotics  Social History Social History  Substance Use Topics  . Smoking status: Former Smoker    Quit date: 10/06/2014  . Smokeless tobacco: Never Used  . Alcohol use No    Review of Systems Constitutional: Negative for fever. Cardiovascular: Negative for chest pain. Respiratory: Positive for shortness of breath, cough with sputum production Gastrointestinal: Negative for abdominal pain, vomiting and diarrhea. Genitourinary: Negative for dysuria. Musculoskeletal:  Negative for back pain. Skin: Negative for rash. Neurological: Negative for headaches, focal weakness or numbness.  10-point ROS otherwise negative.  ____________________________________________   PHYSICAL EXAM:  VITAL SIGNS: ED Triage Vitals [03/23/16 0920]  Enc Vitals Group     BP (!) 127/96     Pulse Rate 69     Resp 18     Temp 98.1 F (36.7 C)     Temp Source Oral     SpO2 100 %     Weight 146 lb 3.2 oz (66.3 kg)     Height 5\' 6"  (1.676 m)     Head Circumference      Peak Flow      Pain Score 0     Pain Loc      Pain Edu?      Excl. in GC?     Constitutional: Alert and oriented. Well appearing and in no distress. Eyes: Conjunctivae are normal. PERRL. Normal extraocular movements. ENT   Head: Normocephalic and atraumatic.   Nose: No congestion/rhinnorhea.   Mouth/Throat: Mucous membranes are moist.   Neck: No stridor. Cardiovascular: Normal rate, regular rhythm. No murmurs, rubs, or gallops. Respiratory: Normal respiratory effort without tachypnea nor retractions. Breath sounds are clear and equal bilaterally. Slight wheezing Gastrointestinal: Soft and nontender. Normal bowel sounds Musculoskeletal: Nontender with normal range of motion in all extremities. No lower extremity tenderness nor edema. Neurologic:  Normal speech and language. No gross focal neurologic deficits are appreciated.  Skin:  Skin is warm, dry and intact. No rash noted. Psychiatric: Mood and affect are normal. Speech  and behavior are normal.  ____________________________________________  EKG: Interpreted by me. Sinus rhythm with a rate of 72 bpm, normal PR interval, normal QRS, normal QT, normal axis.  ____________________________________________  ED COURSE:  Pertinent labs & imaging results that were available during my care of the patient were reviewed by me and considered in my medical decision making (see chart for details). Clinical Course  Patient presents to the ER with  shortness of breath, likely COPD exacerbation. We will assess with labs and imaging.  Procedures ____________________________________________   LABS (pertinent positives/negatives)  Labs Reviewed  BASIC METABOLIC PANEL - Abnormal; Notable for the following:       Result Value   Glucose, Bld 102 (*)    GFR calc non Af Amer 54 (*)    All other components within normal limits  CBC  URINALYSIS COMPLETEWITH MICROSCOPIC ([facility] ONLY)    RADIOLOGY Chest x-ray IMPRESSION: No acute abnormalities.  Emphysema.  Aortic atherosclerosis.    ____________________________________________  FINAL ASSESSMENT AND PLAN  COPD exacerbation  Plan: Patient with labs and imaging as dictated above. Patient with subacute bronchitis or COPD exacerbation. I will encourage her to use her albuterol and Advair as prescribed. I will prescribe a short course of steroids and a Z-Pak. She is stable for follow-up with her doctor.   Emily FilbertWilliams, Chalise Pe E, MD   Note: This dictation was prepared with Dragon dictation. Any transcriptional errors that result from this process are unintentional    Emily FilbertJonathan E Osiris Charles, MD 03/23/16 1040

## 2016-03-23 NOTE — ED Notes (Signed)
Duoneb complete.  Patient states she is feeling better.  Lung fields with improved air entry and exchange.  Respirations regular and non labored.

## 2016-03-24 DIAGNOSIS — I10 Essential (primary) hypertension: Secondary | ICD-10-CM | POA: Diagnosis not present

## 2016-03-24 DIAGNOSIS — J432 Centrilobular emphysema: Secondary | ICD-10-CM | POA: Diagnosis not present

## 2016-03-24 DIAGNOSIS — F419 Anxiety disorder, unspecified: Secondary | ICD-10-CM | POA: Diagnosis not present

## 2016-03-24 DIAGNOSIS — N390 Urinary tract infection, site not specified: Secondary | ICD-10-CM | POA: Diagnosis not present

## 2016-03-24 DIAGNOSIS — M1711 Unilateral primary osteoarthritis, right knee: Secondary | ICD-10-CM | POA: Diagnosis not present

## 2016-03-24 DIAGNOSIS — Z23 Encounter for immunization: Secondary | ICD-10-CM | POA: Diagnosis not present

## 2016-03-24 DIAGNOSIS — I739 Peripheral vascular disease, unspecified: Secondary | ICD-10-CM | POA: Diagnosis not present

## 2016-03-27 ENCOUNTER — Encounter: Payer: Self-pay | Admitting: *Deleted

## 2016-03-27 ENCOUNTER — Other Ambulatory Visit: Payer: Self-pay | Admitting: *Deleted

## 2016-03-27 NOTE — Patient Outreach (Signed)
[facility]) Care Management   03/27/2016  Bethany [name] 04-08-1936 [id number]  Bethany [name] is an 80 y.o. female  Subjective:  Patient discussed her recent visit to Emergency room for increased shortness of breath,cough and green sputum, Patient states she is completing antibiotics and prednisone as instructed. Patient also had scheduled visit with PCP on next day for her regular scheduled physical and was diagnosed with a UTI infection from specimen obtained on lab visit the prior week. Patient reports feeling some better, decreased cough sputum clearer.    Bethany [name] states she has not experienced a fall since last month.  Patient reports she has not been as diligent with monitoring her blood pressure in the last few weeks but her blood pressure reading at office visit was good.   Patient discussed concern with weight loss in the last 2 months, she discussed decreased appetite, but trying to eat.  Objective:  BP 128/74 (BP Location: Right Arm, Patient Position: Sitting, Cuff Size: Normal)   Pulse 68   Resp 20   SpO2 97%  Oxygen at 3 liters  Review of Systems  Constitutional: Negative.   HENT: Negative.   Eyes: Negative.   Respiratory: Negative.   Cardiovascular: Negative.   Gastrointestinal: Negative.   Genitourinary: Negative.   Musculoskeletal: Positive for back pain.  Skin: Negative.   Neurological: Positive for dizziness.  Endo/Heme/Allergies: Negative.   Psychiatric/Behavioral: Negative.     Physical Exam  Constitutional: She is oriented to person, place, and time. She appears well-developed and well-nourished.  Cardiovascular: Normal rate, normal heart sounds and intact distal pulses.   GI: Soft.  Neurological: She is alert and oriented to person, place, and time.  Skin: Skin is warm and dry.    Encounter Medications:   Outpatient Encounter Prescriptions as of 03/27/2016  Medication Sig Note  . albuterol (PROVENTIL HFA;VENTOLIN HFA) 108 (90  Base) MCG/ACT inhaler Inhale 2 puffs into the lungs every 6 (six) hours as needed for wheezing or shortness of breath.   Marland Kitchen aspirin (ASPIRIN EC) 81 MG EC tablet Take 81 mg by mouth daily. Swallow whole.   Marland Kitchen azithromycin (ZITHROMAX Z-PAK) 250 MG tablet Take 2 tablets (500 mg) on  Day 1,  followed by 1 tablet (250 mg) once daily on Days 2 through 5.   . baclofen (LIORESAL) 10 MG tablet Take 10 mg by mouth 3 (three) times daily.   . cetirizine (ZYRTEC) 10 MG tablet Take 10 mg by mouth daily.   . diazepam (VALIUM) 5 MG tablet Take 5 mg by mouth at bedtime. Patient takes once at night Reported on 10/16/2015   . docusate sodium (COLACE) 100 MG capsule Take 100 mg by mouth 2 (two) times daily as needed for mild constipation.   Marland Kitchen escitalopram (LEXAPRO) 5 MG tablet Take 10 mg by mouth daily.  03/23/2016: Pt take 2 tabs 43m  . Fluticasone-Salmeterol (ADVAIR) 250-50 MCG/DOSE AEPB Inhale 1 puff into the lungs 2 (two) times daily.   .Marland Kitchengabapentin (NEURONTIN) 300 MG capsule Take 300 mg by mouth 3 (three) times daily.   .Marland KitchenHYDROcodone-acetaminophen (NORCO/VICODIN) 5-325 MG tablet Take 1 tablet by mouth 5 (five) times daily as needed for moderate pain. Do not exceed 5 times daily, patient at least 3 day   . losartan (COZAAR) 50 MG tablet Take 50 mg by mouth daily.   .Marland Kitchenlovastatin (MEVACOR) 20 MG tablet Take 20 mg by mouth at bedtime. Reported on 12/12/2015   . metoprolol succinate (TOPROL-XL) 25 MG  24 hr tablet Take 25 mg by mouth daily.   . montelukast (SINGULAIR) 10 MG tablet Take 10 mg by mouth at bedtime. Reported on 12/12/2015   . predniSONE (STERAPRED UNI-PAK 21 TAB) 10 MG (21) TBPK tablet Dispense steroid taper pack as directed   . tamsulosin (FLOMAX) 0.4 MG CAPS capsule Take 0.4 mg by mouth daily after supper.   . vitamin B-12 (CYANOCOBALAMIN) 1000 MCG tablet Take 1,000 mcg by mouth daily.    No facility-administered encounter medications on file as of 03/27/2016.     Functional Status:   In your present  state of health, do you have any difficulty performing the following activities: 03/27/2016 01/01/2016  Hearing? N N  Vision? N N  Difficulty concentrating or making decisions? [name]  Walking or climbing stairs? Y Y  Dressing or bathing? N N  Doing errands, shopping? N N  Preparing Food and eating ? N N  Using the Toilet? N N  In the past six months, have you accidently leaked urine? Y Y  Do you have problems with loss of bowel control? N N  Managing your Medications? N N  Managing your Finances? N N  Housekeeping or managing your Housekeeping? [name]  Some recent data might be hidden    Fall/Depression Screening:    PHQ 2/9 Scores 01/01/2016 12/12/2015 11/12/2015 10/16/2015 09/13/2015  PHQ - 2 Score 0 0 0 0 0   Fall Risk  01/01/2016 12/12/2015 11/12/2015 10/16/2015 10/11/2015  Falls in the past year? Yes Yes Yes Yes Yes  Number falls in past yr: 2 or more 2 or more 2 or more 2 or more 1  Injury with Fall? Yes Yes Yes Yes Yes  Risk Factor Category  High Fall Risk High Fall Risk High Fall Risk High Fall Risk -  Risk for fall due to : Impaired balance/gait;History of fall(s) - Impaired balance/gait;History of fall(s) History of fall(s);Impaired balance/gait History of fall(s)  Risk for fall due to (comments): - - - - -  Follow up Falls evaluation completed;Falls prevention discussed Falls prevention discussed;Education provided Falls evaluation completed;Education provided;Falls prevention discussed Education provided;Falls prevention discussed;Falls evaluation completed Falls prevention discussed    Assessment:  Routine home visit  COPD- completing antibiotics, improved symptoms,breathing as green her baseline today.    Falls- decreased falls, benefit from fall prevention reinforcement of strategies, reminded during visit to use her cane.   Hypertension- readings at goal. Patient agreeable to getting back to monitoring at home.    Nutrition - Weight loss, decreased appetite, drinking boost twice  daily now,  agreeable to getting back to  monitoring weights. Receives meals on wheels , brother helps with preparing some meals.  Bethany [name] is hopeful to being able to begin process of  outpatient physical therapy in next month and then pulmonary rehab.   Discussed telephonic health coach option for continued education and management of COPD, patient in agreement, will evaluate at next visit if no further care coordination needs will plan transition .   Plan:  Will plan follow up in one month with home visit. Reviewed yellow zone symptoms to notify MD of sooner and arrange office visit, and 911 for emergency.   [facility] CM Care Plan Problem One   Flowsheet Row Most Recent Value  Care Plan Problem One  Lack of knowledge related to self managment of Hypertension as evidenced by increased blood pressure readings   Role Documenting the Problem One  Care Management [place] for Problem One  Active  THN Long Term Goal (31-90 days)  Patient will verbalize increased knowledge of Hypertension self care management skills in the next 60 days  [Goal revised]  THN Long Term Goal Start Date  02/05/16 [goal date restart ]  THN Long Term Goal Met Date  02/25/16  THN CM Short Term Goal #1 (0-30 days)  Patient will be able to return   demonostrate proper use of blood pressure monitor in the next 14 days   THN CM Short Term Goal #1 Start Date  12/30/15 [goal date restarted]  [facility] CM Short Term Goal #1 Met Date  01/01/16  THN CM Short Term Goal #2 (0-30 days)  Patient will monitor blood pressure at least 3 days week and keep a record in the next 30 days  [goal restated ]  THN CM Short Term Goal #2 Start Date  02/25/16 [goal date restarted]  Interventions for Short Term Goal #2  Discussed importance of monitoring blood pressure,and keeping a record  THN CM Short Term Goal #3 (0-30 days)  Patient will be able to identify at least 3 low salt food choices in the next 30 days   THN CM Short Term Goal #3  Start Date  01/01/16  [facility] CM Short Term Goal #3 Met Date  02/05/16  THN CM Short Term Goal #4 (0-30 days)  Patient will be able to identify sodium content in a serving on food label in the next 30 days   THN CM Short Term Goal #4 Start Date  01/01/16  [facility] CM Short Term Goal #4 Met Date  02/05/16    [facility] CM Care Plan Problem Two   Flowsheet Row Most Recent Value  Care Plan Problem Two  Knowledge deficit related to chronic management of COPD  Role Documenting the Problem Two  Care Management Coordinator  Care Plan for Problem Two  Active  THN Long Term Goal (31-90) days  Patient will be able to verbalize increased knowledge of management of COPD in the next 31 days   THN Long Term Goal Start Date  02/05/16 [goal date updated]  [facility] Long Term Goal Met Date  02/25/16  THN CM Short Term Goal #1 (0-30 days)  Pateint will be able to continue to state at least 3 yellow zone symptoms and action plan  [facility] CM Short Term Goal #1 Start Date  01/01/16  [facility] CM Short Term Goal #1 Met Date   02/05/16  THN CM Short Term Goal #2 (0-30 days)  Patient will report walking at least 5 minutes 3 days a week in the next 30 days  [Goal revised ]  THN CM Short Term Goal #2 Start Date  02/05/16 [goal date restart ]    [facility] CM Care Plan Problem Three   Flowsheet Row Most Recent Value  Care Plan Problem Three  High fall risk as evidenced by recent fall   Role Documenting the Problem Three  Care Management Coordinator  Care Plan for Problem Three  Active  THN Long Term Goal (31-90) days  Patient will be able to report strategies to prevent falls in the next 31 days  THN Long Term Goal Start Date  02/25/16 [goal date 6/6 updated ]  Interventions for Problem Three Long Term Goal  Reinforced standing a few seconds prior to starting to walk   THN CM Short Term Goal #1 (0-30 days)  Patient will report no falls in the next 30 days  THN CM Short Term Goal #1 Start Date  02/25/16 [goal date restarted]  THN CM Short Term Goal #1  Met Date  03/27/16  Interventions for Short Term Goal #1  Reinforced with patient to make sure  she turns the light on a night   THN CM Short Term Goal #2 (0-30 days)  Patient will report using her walker or cane at all times in the next 30 days [goal updated]  THN CM Short Term Goal #2 Start Date  02/25/16 [goal date restarted]  Interventions for Short Term Goal #2  Encouraged to use walking aid to prevent fall , and help with balance  THN CM Short Term Goal #3 (0-30 days)  Patient will report obtaining medical alert system in the next 30 days   THN  CM Short Term Goal #3 Start Date  02/25/16  [facility] CM Short Term Goal #3 Met Date  03/27/16     Joylene Draft, RN, [place] Management [phone]- Mobile [phone]- [place] Office

## 2016-03-31 DIAGNOSIS — I10 Essential (primary) hypertension: Secondary | ICD-10-CM | POA: Diagnosis not present

## 2016-03-31 DIAGNOSIS — J449 Chronic obstructive pulmonary disease, unspecified: Secondary | ICD-10-CM | POA: Diagnosis not present

## 2016-03-31 DIAGNOSIS — J9620 Acute and chronic respiratory failure, unspecified whether with hypoxia or hypercapnia: Secondary | ICD-10-CM | POA: Diagnosis not present

## 2016-03-31 DIAGNOSIS — I82409 Acute embolism and thrombosis of unspecified deep veins of unspecified lower extremity: Secondary | ICD-10-CM | POA: Diagnosis not present

## 2016-04-20 DIAGNOSIS — R2689 Other abnormalities of gait and mobility: Secondary | ICD-10-CM | POA: Diagnosis not present

## 2016-04-20 DIAGNOSIS — M5136 Other intervertebral disc degeneration, lumbar region: Secondary | ICD-10-CM | POA: Diagnosis not present

## 2016-04-20 DIAGNOSIS — J441 Chronic obstructive pulmonary disease with (acute) exacerbation: Secondary | ICD-10-CM | POA: Diagnosis not present

## 2016-04-20 DIAGNOSIS — M5416 Radiculopathy, lumbar region: Secondary | ICD-10-CM | POA: Diagnosis not present

## 2016-04-23 ENCOUNTER — Encounter: Payer: Self-pay | Admitting: *Deleted

## 2016-04-23 ENCOUNTER — Other Ambulatory Visit: Payer: Self-pay | Admitting: *Deleted

## 2016-04-23 NOTE — Patient Outreach (Addendum)
[facility]) Care Management   04/23/2016  MATTIA OSTERMAN May 14, 1936 [id number]  ATHEA HALEY is an 80 y.o. female  Subjective:  Patient reports her breathing is doing good, no increase in shortness of breath,cough has some wheezes when moving around.  Patient discussed being excited about having an appointment for Pulmonary rehab orientation on next week. Mrs.Cieslewicz states she started back driving on last week discussed how good it felt to be able to do something on her own Patient reports her blood pressure has been doing good started back checking it regularly . Patient reports appetite doing pretty good and she is pleased with her current weight staying at 150,drinks boost like supplement drink at least daily between meals.   Patient reports she continues to use her walker, no fall in the last month.  Objective:  BP 120/74   Pulse (!) 59   Resp 18   SpO2 96% 3 liters  Review of Systems  Constitutional: Negative.   HENT: Negative.   Eyes: Negative.   Respiratory: Positive for wheezing.   Cardiovascular: Negative.  Negative for leg swelling.  Gastrointestinal: Negative.   Genitourinary: Negative.   Musculoskeletal: Negative.   Skin: Negative.   Neurological: Negative.   Endo/Heme/Allergies: Negative.   Psychiatric/Behavioral: Negative.     Physical Exam  Constitutional: She is oriented to person, place, and time. She appears well-developed and well-nourished.  Cardiovascular: Normal rate, normal heart sounds and intact distal pulses.   Respiratory: She has wheezes.  GI: Soft.  Neurological: She is alert and oriented to person, place, and time.  Skin: Skin is warm and dry.  Psychiatric: She has a normal mood and affect. Her behavior is normal. Judgment and thought content normal.    Encounter Medications:   Outpatient Encounter Prescriptions as of 04/23/2016  Medication Sig Note  . albuterol (PROVENTIL HFA;VENTOLIN HFA) 108 (90 Base) MCG/ACT inhaler  Inhale 2 puffs into the lungs every 6 (six) hours as needed for wheezing or shortness of breath.   Marland Kitchen aspirin (ASPIRIN EC) 81 MG EC tablet Take 81 mg by mouth daily. Swallow whole.   Marland Kitchen azithromycin (ZITHROMAX Z-PAK) 250 MG tablet Take 2 tablets (500 mg) on  Day 1,  followed by 1 tablet (250 mg) once daily on Days 2 through 5.   . baclofen (LIORESAL) 10 MG tablet Take 10 mg by mouth 3 (three) times daily.   . cetirizine (ZYRTEC) 10 MG tablet Take 10 mg by mouth daily.   . diazepam (VALIUM) 5 MG tablet Take 5 mg by mouth at bedtime. Patient takes once at night Reported on 10/16/2015   . docusate sodium (COLACE) 100 MG capsule Take 100 mg by mouth 2 (two) times daily as needed for mild constipation.   Marland Kitchen escitalopram (LEXAPRO) 5 MG tablet Take 10 mg by mouth daily.  03/23/2016: Pt take 2 tabs 47m  . Fluticasone-Salmeterol (ADVAIR) 250-50 MCG/DOSE AEPB Inhale 1 puff into the lungs 2 (two) times daily.   .Marland Kitchengabapentin (NEURONTIN) 300 MG capsule Take 300 mg by mouth 3 (three) times daily.   .Marland KitchenHYDROcodone-acetaminophen (NORCO/VICODIN) 5-325 MG tablet Take 1 tablet by mouth 5 (five) times daily as needed for moderate pain. Do not exceed 5 times daily, patient at least 3 day   . losartan (COZAAR) 50 MG tablet Take 50 mg by mouth daily.   .Marland Kitchenlovastatin (MEVACOR) 20 MG tablet Take 20 mg by mouth at bedtime. Reported on 12/12/2015   . metoprolol succinate (TOPROL-XL) 25 MG 24  hr tablet Take 25 mg by mouth daily.   . montelukast (SINGULAIR) 10 MG tablet Take 10 mg by mouth at bedtime. Reported on 12/12/2015   . predniSONE (STERAPRED UNI-PAK 21 TAB) 10 MG (21) TBPK tablet Dispense steroid taper pack as directed   . tamsulosin (FLOMAX) 0.4 MG CAPS capsule Take 0.4 mg by mouth daily after supper.   . vitamin B-12 (CYANOCOBALAMIN) 1000 MCG tablet Take 1,000 mcg by mouth daily.    No facility-administered encounter medications on file as of 04/23/2016.     Functional Status:   In your present state of health, do you  have any difficulty performing the following activities: 03/27/2016 01/01/2016  Hearing? N N  Vision? N N  Difficulty concentrating or making decisions? [name]  Walking or climbing stairs? Y Y  Dressing or bathing? N N  Doing errands, shopping? N N  Preparing Food and eating ? N N  Using the Toilet? N N  In the past six months, have you accidently leaked urine? Y Y  Do you have problems with loss of bowel control? N N  Managing your Medications? N N  Managing your Finances? N N  Housekeeping or managing your Housekeeping? [name]  Some recent data might be hidden    Fall/Depression Screening:    PHQ 2/9 Scores 01/01/2016 12/12/2015 11/12/2015 10/16/2015 09/13/2015  PHQ - 2 Score 0 0 0 0 0    Assessment:  Routine home visit. Patient continues to take medications as prescribed  COPD - stable , green zone, to begin pulmonary rehab Hypertension - Stable, watching salt in foods  Nutrition- no weight loss in last month, tolerating meals on wheels, easy food she prepares and foods her brother/friends bring .  High Fall risk- no falls in last month,   Plan:  Will plan phone call in 2 weeks to follow up on pulmonary rehab progress and home visit  in next month with  case closure if no new care management needs patient in agreement .    THN CM Care Plan Problem One   Flowsheet Row Most Recent Value  Care Plan Problem One  Lack of knowledge related to self managment of Hypertension as evidenced by increased blood pressure readings   Role Documenting the Problem One  Care Management Coordinator  Care Plan for Problem One  Not Active  THN Long Term Goal (31-90 days)  Patient will verbalize increased knowledge of Hypertension self care management skills in the next 60 days  [Goal revised]  THN Long Term Goal Start Date  02/05/16 [goal date restart ]  THN Long Term Goal Met Date  02/25/16  THN CM Short Term Goal #1 (0-30 days)  Patient will be able to return   demonostrate proper use of blood pressure  monitor in the next 14 days   THN CM Short Term Goal #1 Start Date  12/30/15 [goal date restarted]  [facility] CM Short Term Goal #1 Met Date  01/01/16  THN CM Short Term Goal #2 (0-30 days)  Patient will monitor blood pressure at least 3 days week and keep a record in the next 30 days  [goal restated ]  THN CM Short Term Goal #2 Start Date  02/25/16 [goal date restarted]  [facility] CM Short Term Goal #2 Met Date  04/23/16  THN CM Short Term Goal #3 (0-30 days)  Patient will be able to identify at least 3 low salt food choices in the next 30 days   THN CM Short  Term Goal #3 Start Date  01/01/16  [facility]N CM Short Term Goal #3 Met Date  02/05/16  THN CM Short Term Goal #4 (0-30 days)  Patient will be able to identify sodium content in a serving on food label in the next 30 days   THN CM Short Term Goal #4 Start Date  01/01/16  [facility] CM Short Term Goal #4 Met Date  02/05/16    [facility] CM Care Plan Problem Two   Flowsheet Row Most Recent Value  Care Plan Problem Two  Knowledge deficit related to chronic management of COPD  Role Documenting the Problem Two  Care Management Coordinator  Care Plan for Problem Two  Active  THN Long Term Goal (31-90) days  Patient will be able to verbalize increased knowledge of management of COPD in the next 31 days   THN Long Term Goal Start Date  02/05/16 [goal date updated]  [facility] Long Term Goal Met Date  02/25/16  THN CM Short Term Goal #1 (0-30 days)  Pateint will be able to continue to state at least 3 yellow zone symptoms and action plan  THN CM Short Term Goal #1 Start Date  01/01/16  [facility] CM Short Term Goal #1 Met Date   02/05/16  THN CM Short Term Goal #2 (0-30 days)  Patient will report walking at least 5 minutes 3 days a week in the next 30 days  [Goal revised ]  THN CM Short Term Goal #2 Start Date  04/23/16 Billy Fischer ]  Interventions for Short Term Goal #2  Reviewed importance of exercise and activity to help increase strenght and balance   THN CM Short Term Goal #3 (0-30  days)  Patient will report attending Pulmonary rehab in the next 30 days   THN CM Short Term Goal #3 Start Date  04/23/16  Interventions for Short Term Goal #3  Reviewed patient transportation plans to pulmonary rehab class, encouraged attendance     [facility] CM Care Plan Problem Three   Flowsheet Row Most Recent Value  Care Plan Problem Three  High fall risk as evidenced by recent fall   Role Documenting the Problem Three  Care Management Coordinator  Care Plan for Problem Three  Active  THN Long Term Goal (31-90) days  Patient will be able to report strategies to prevent falls in the next 31 days  THN Long Term Goal Start Date  02/25/16 Barrie Folk date 6/6 updated ]  Interventions for Problem Three Long Term Goal  Reinforced standing a few seconds prior to starting to walk   THN CM Short Term Goal #1 (0-30 days)  Patient will report no falls in the next 30 days  THN CM Short Term Goal #1 Start Date  02/25/16 [goal date restarted]  [facility] CM Short Term Goal #1 Met Date  03/27/16  Interventions for Short Term Goal #1  Reinforced with patient to make sure  she turns the light on a night   THN CM Short Term Goal #2 (0-30 days)  Patient will report using her walker or cane at all times in the next 30 days [goal updated]  THN CM Short Term Goal #2 Start Date  02/25/16 [goal date restarted]  Interventions for Short Term Goal #2  Encouraged to use walking aid to prevent fall , and help with balance  THN CM Short Term Goal #3 (0-30 days)  Patient will report obtaining medical alert system in the next 30 days   THN  CM Short Term Goal #  3 Start Date  02/25/16  [facility] CM Short Term Goal #3 Met Date  03/27/16     Joylene Draft, RN, [place] Care Management [phone]- Mobile [phone]- [place]

## 2016-04-28 ENCOUNTER — Encounter: Payer: Commercial Managed Care - HMO | Attending: Physical Medicine and Rehabilitation | Admitting: Respiratory Therapy

## 2016-04-28 VITALS — Ht 65.0 in | Wt 152.8 lb

## 2016-04-28 DIAGNOSIS — J449 Chronic obstructive pulmonary disease, unspecified: Secondary | ICD-10-CM | POA: Diagnosis not present

## 2016-05-01 DIAGNOSIS — J449 Chronic obstructive pulmonary disease, unspecified: Secondary | ICD-10-CM | POA: Diagnosis not present

## 2016-05-01 DIAGNOSIS — I10 Essential (primary) hypertension: Secondary | ICD-10-CM | POA: Diagnosis not present

## 2016-05-01 DIAGNOSIS — I82409 Acute embolism and thrombosis of unspecified deep veins of unspecified lower extremity: Secondary | ICD-10-CM | POA: Diagnosis not present

## 2016-05-01 DIAGNOSIS — J9620 Acute and chronic respiratory failure, unspecified whether with hypoxia or hypercapnia: Secondary | ICD-10-CM | POA: Diagnosis not present

## 2016-05-04 ENCOUNTER — Encounter: Payer: Self-pay | Admitting: *Deleted

## 2016-05-04 ENCOUNTER — Encounter: Payer: Commercial Managed Care - HMO | Admitting: *Deleted

## 2016-05-04 DIAGNOSIS — J449 Chronic obstructive pulmonary disease, unspecified: Secondary | ICD-10-CM

## 2016-05-04 NOTE — Progress Notes (Signed)
Daily Session Note  Patient Details  Name: ANALISSE RANDLE MRN: [id number] Date of Birth: 22-Jun-1935 Referring Provider:   Flowsheet Row Pulmonary Rehab from 04/28/2016 in [facility]  Referring Provider  Chasnis      Encounter Date: 05/04/2016  Check In:     Session Check In - 05/04/16 1013      Check-In   Location [facility]-Cardiac & Pulmonary Rehab   Staff Present Carson Myrtle, BS, RRT, Respiratory Therapist;Jhordan Kinter Amedeo Plenty, BS, ACSM CEP, Exercise Physiologist;Amanda Oletta Darter, BA, ACSM CEP, Exercise Physiologist   Supervising physician immediately available to respond to emergencies See telemetry face sheet for immediately available ER MD   Physician(s) Drs. Joni Fears and [place]   Medication changes reported     No   Fall or balance concerns reported    No   Warm-up and Cool-down Performed on first and last piece of equipment   Resistance Training Performed Yes   VAD Patient? No     Pain Assessment   Currently in Pain? No/denies   Multiple Pain Sites No         Goals Met:  Proper associated with RPD/PD & O2 Sat Independence with exercise equipment Exercise tolerated well Strength training completed today  Goals Unmet:  Not Applicable  Comments: First full day of exercise!  Patient was oriented to gym and equipment including functions, settings, policies, and procedures.  Patient's individual exercise prescription and treatment plan were reviewed.  All starting workloads were established based on the results of the 6 minute walk test done at initial orientation visit.  The plan for exercise progression was also introduced and progression will be customized based on patient's performance and goals.     Dr. Emily Filbert is Medical Director for [place] and [name].

## 2016-05-04 NOTE — Progress Notes (Signed)
Pulmonary Individual Treatment Plan  Patient Details  Name: Bethany [name] MRN: [id number] Date of Birth: [birth year] Referring Provider:   Flowsheet Row Pulmonary Rehab from 04/28/2016 in [facility] Cardiac and Pulmonary Rehab  Referring Provider  Chasnis      Initial Encounter Date:  Flowsheet Row Pulmonary Rehab from 04/28/2016 in [facility] Cardiac and Pulmonary Rehab  Date  04/28/16  Referring Provider  Chasnis      Visit Diagnosis: Chronic obstructive pulmonary disease, unspecified COPD type ([place])  Patient's Home Medications on Admission:  Current Outpatient Prescriptions:  .  albuterol (PROVENTIL HFA;VENTOLIN HFA) 108 (90 Base) MCG/ACT inhaler, Inhale 2 puffs into the lungs every 6 (six) hours as needed for wheezing or shortness of breath., Disp: 1 Inhaler, Rfl: 2 .  aspirin (ASPIRIN EC) 81 MG EC tablet, Take 81 mg by mouth daily. Swallow whole., Disp: , Rfl:  .  azithromycin (ZITHROMAX Z-PAK) 250 MG tablet, Take 2 tablets (500 mg) on  Day 1,  followed by 1 tablet (250 mg) once daily on Days 2 through 5. (Patient not taking: Reported on 04/23/2016), Disp: 6 each, Rfl: 0 .  baclofen (LIORESAL) 10 MG tablet, Take 10 mg by mouth 3 (three) times daily., Disp: , Rfl:  .  cetirizine (ZYRTEC) 10 MG tablet, Take 10 mg by mouth daily., Disp: , Rfl:  .  diazepam (VALIUM) 5 MG tablet, Take 5 mg by mouth at bedtime. Patient takes once at night Reported on 10/16/2015, Disp: , Rfl:  .  docusate sodium (COLACE) 100 MG capsule, Take 100 mg by mouth 2 (two) times daily as needed for mild constipation., Disp: , Rfl:  .  escitalopram (LEXAPRO) 5 MG tablet, Take 10 mg by mouth daily. , Disp: , Rfl:  .  Fluticasone-Salmeterol (ADVAIR) 250-50 MCG/DOSE AEPB, Inhale 1 puff into the lungs 2 (two) times daily., Disp: , Rfl:  .  gabapentin (NEURONTIN) 300 MG capsule, Take 300 mg by mouth 3 (three) times daily., Disp: , Rfl:  .  HYDROcodone-acetaminophen (NORCO/VICODIN) 5-325 MG tablet, Take 1 tablet by mouth 5 (five)  times daily as needed for moderate pain. Do not exceed 5 times daily, patient at least 3 day, Disp: , Rfl:  .  losartan (COZAAR) 50 MG tablet, Take 50 mg by mouth daily., Disp: , Rfl:  .  lovastatin (MEVACOR) 20 MG tablet, Take 20 mg by mouth at bedtime. Reported on 12/12/2015, Disp: , Rfl:  .  metoprolol succinate (TOPROL-XL) 25 MG 24 hr tablet, Take 25 mg by mouth daily., Disp: , Rfl:  .  montelukast (SINGULAIR) 10 MG tablet, Take 10 mg by mouth at bedtime. Reported on 12/12/2015, Disp: , Rfl:  .  predniSONE (STERAPRED UNI-PAK 21 TAB) 10 MG (21) TBPK tablet, Dispense steroid taper pack as directed (Patient not taking: Reported on 04/23/2016), Disp: 21 tablet, Rfl: 0 .  tamsulosin (FLOMAX) 0.4 MG CAPS capsule, Take 0.4 mg by mouth daily after supper., Disp: , Rfl:  .  vitamin B-12 (CYANOCOBALAMIN) 1000 MCG tablet, Take 1,000 mcg by mouth daily., Disp: , Rfl:   Past Medical History: Past Medical History:  Diagnosis Date  . Allergy   . Anxiety   . Arthritis   . COPD (chronic obstructive pulmonary disease) ([place])   . Hyperlipidemia   . Hypertension   . Oxygen deficiency     Tobacco Use: History  Smoking Status  . Former Smoker  . Quit date: 10/06/2014  Smokeless Tobacco  . Never Used    Labs: Recent Review Flowsheet Data  There is no flowsheet data to display.       ADL UCSD:     Pulmonary Assessment Scores    Row Name 04/28/16 1154         ADL UCSD   ADL Phase Entry     SOB Score total 82     Rest 1     Walk 3     Stairs 5     Bath 5     Dress 3     Shop 2        Pulmonary Function Assessment:     Pulmonary Function Assessment - 04/28/16 1152      Pulmonary Function Tests   FVC% 41 %   FEV1% 39 %   FEV1/FVC Ratio 70.49     Initial Spirometry Results   Comments Test date 04/28/16     Breath   Bilateral Breath Sounds Clear   Shortness of Breath Yes;Limiting activity;Fear of Shortness of Breath      Exercise Target Goals:    Exercise Program  Goal: Individual exercise prescription set with THRR, safety & activity barriers. Participant demonstrates ability to understand and report RPE using BORG scale, to self-measure pulse accurately, and to acknowledge the importance of the exercise prescription.  Exercise Prescription Goal: Starting with aerobic activity 30 plus minutes a day, 3 days per week for initial exercise prescription. Provide home exercise prescription and guidelines that participant acknowledges understanding prior to discharge.  Activity Barriers & Risk Stratification:     Activity Barriers & Cardiac Risk Stratification - 04/28/16 1151      Activity Barriers & Cardiac Risk Stratification   Activity Barriers Back Problems;Shortness of Breath;Deconditioning   Cardiac Risk Stratification Moderate      6 Minute Walk:     6 Minute Walk    Row Name 04/28/16 1228         6 Minute Walk   Distance 675 feet     Walk Time 5.4 minutes     # of Rest Breaks 3     MPH 1.42     METS 2.07     VO2 Peak 5.97     Symptoms No     Resting HR 73 bpm     Resting BP 128/70     Max Ex. HR 112 bpm     Max Ex. BP 148/84       Interval HR   Baseline HR 73     1 Minute HR 106     2 Minute HR 103     3 Minute HR 109     4 Minute HR 105     5 Minute HR 112     6 Minute HR 112     2 Minute Post HR 96     Interval Heart Rate? Yes        Initial Exercise Prescription:     Initial Exercise Prescription - 04/28/16 1200      Date of Initial Exercise RX and Referring Provider   Date 04/28/16   Referring Provider Chasnis     Oxygen   Oxygen Continuous   Liters 3     Treadmill   MPH 1.4   Grade 0   Minutes 15  3/3/3/3   METs 2     NuStep   Level 2   Minutes 15   METs 2     Biostep-RELP   Level 2   Minutes 15   METs 2     Prescription  Details   Frequency (times per week) 3   Duration Progress to 45 minutes of aerobic exercise without signs/symptoms of physical distress     Intensity   THRR 40-80%  of Max Heartrate 100-128   Ratings of Perceived Exertion 11-13   Perceived Dyspnea 0-4     Progression   Progression Continue to progress workloads to maintain intensity without signs/symptoms of physical distress.     Resistance Training   Training Prescription Yes   Weight 2   Reps 10-12      Perform Capillary Blood Glucose checks as needed.  Exercise Prescription Changes:     Exercise Prescription Changes    Row Name 05/04/16 1100             Response to Exercise   Blood Pressure (Admit) 111/58       Blood Pressure (Exercise) 142/70       Blood Pressure (Exit) 132/72       Heart Rate (Admit) 79 bpm       Heart Rate (Exercise) 89 bpm       Heart Rate (Exit) 69 bpm       Oxygen Saturation (Admit) 99 %       Oxygen Saturation (Exercise) 94 %       Oxygen Saturation (Exit) 99 %       Rating of Perceived Exertion (Exercise) 14       Perceived Dyspnea (Exercise) 3         Resistance Training   Training Prescription Yes       Weight 2       Reps 10-12         Oxygen   Oxygen Continuous       Liters 3         Treadmill   MPH 1.4       Grade 0       Minutes 15  3/3/3/3       METs 2         NuStep   Level 2       Minutes 15       METs 2         Biostep-RELP   Level 2       Minutes 15       METs 2          Exercise Comments:     Exercise Comments    Row Name 05/04/16 1109           Exercise Comments First full day of exercise!  Patient was oriented to gym and equipment including functions, settings, policies, and procedures.  Patient's individual exercise prescription and treatment plan were reviewed.  All starting workloads were established based on the results of the 6 minute walk test done at initial orientation visit.  The plan for exercise progression was also introduced and progression will be customized based on patient's performance and goals          Discharge Exercise Prescription (Final Exercise Prescription Changes):     Exercise  Prescription Changes - 05/04/16 1100      Response to Exercise   Blood Pressure (Admit) 111/58   Blood Pressure (Exercise) 142/70   Blood Pressure (Exit) 132/72   Heart Rate (Admit) 79 bpm   Heart Rate (Exercise) 89 bpm   Heart Rate (Exit) 69 bpm   Oxygen Saturation (Admit) 99 %   Oxygen Saturation (Exercise) 94 %   Oxygen Saturation (Exit) 99 %   Rating  of Perceived Exertion (Exercise) 14   Perceived Dyspnea (Exercise) 3     Resistance Training   Training Prescription Yes   Weight 2   Reps 10-12     Oxygen   Oxygen Continuous   Liters 3     Treadmill   MPH 1.4   Grade 0   Minutes 15  3/3/3/3   METs 2     NuStep   Level 2   Minutes 15   METs 2     Biostep-RELP   Level 2   Minutes 15   METs 2       Nutrition:  Target Goals: Understanding of nutrition guidelines, daily intake of sodium <1572m, cholesterol <2035m calories 30% from fat and 7% or less from saturated fats, daily to have 5 or more servings of fruits and vegetables.  Biometrics:     Pre Biometrics - 04/28/16 1227      Pre Biometrics   Height 5' 5"  (1.651 m)   Weight 152 lb 12.8 oz (69.3 kg)   Waist Circumference 36.5 inches   Hip Circumference 40.75 inches   Waist to Hip Ratio 0.9 %   BMI (Calculated) 25.5       Nutrition Therapy Plan and Nutrition Goals:   Nutrition Discharge: Rate Your Plate Scores:   Psychosocial: Target Goals: Acknowledge presence or absence of depression, maximize coping skills, provide positive support system. Participant is able to verbalize types and ability to use techniques and skills needed for reducing stress and depression.  Initial Review & Psychosocial Screening:     Initial Psych Review & Screening - 04/28/16 1157      Family Dynamics   Comments Bethany [name] dealing with the lose of her best friend who passed away last week. Her friend had been ill for some time, so she is comforted that she is not suffering anymore. Bethany [name] [name] will  benfit her to be more active and help her to move on from her friend's lose.her activity and help h      Quality of Life Scores:     Quality of Life - 04/28/16 1203      Quality of Life Scores   Health/Function Pre 21 %   Socioeconomic Pre 20.29 %   Psych/Spiritual Pre 21 %   Family Pre 21 %   GLOBAL Pre 20.84 %      PHQ-9: Recent Review Flowsheet Data    [facility] 04/28/2016 01/01/2016 12/12/2015 11/12/2015 10/16/2015   Decreased Interest 2 0 0 0 0   Down, Depressed, Hopeless 1 0 0 0 0   PHQ - 2 Score 3 0 0 0 0   Altered sleeping 2 - - - -   Tired, decreased energy 3 - - - -   Change in appetite 3 - - - -   Feeling bad or failure about yourself  1 - - - -   Trouble concentrating 2 - - - -   Moving slowly or fidgety/restless 0 - - - -   Suicidal thoughts 0 - - - -   PHQ-9 Score 14 - - - -   Difficult doing work/chores Somewhat difficult - - - -      Psychosocial Evaluation and Intervention:     Psychosocial Evaluation - 05/04/16 1037      Psychosocial Evaluation & Interventions   Interventions Encouraged to exercise with the program and follow exercise prescription   Comments Counselor met with Bethany [name] for initial  psychosocial evaluation.  She is a 80 year old female who was diagnosed with COPD in 2015.  Her support system is limited at this time with a best friend who recently passed away and a brother who lived with Bethany [name] recently moved out.  She has close neighbors and realizes she needs to get back involved with her local church [employer] class.  She has had multiple back surgeries and has fallen (4) times in the past several years; the last time was one month ago.  She reports both her sleep and her appetite have been poor but are improving somewhat recently.  She denies a history of depression or anxiety; however, Counselor discussed her PHQ-9 Scores of "14" indicating moderate depression with her.  She admitted since her friend passed this has  been very difficult for her.  She states her stressors include just not being able to do those things she used to enjoy.  She has goals to improve her breathing and flexibility and to "quit falling."  Counselor encouraged her to increase her social supports to improve her mood and her feeling of isolation with the recent losses.  She agreed to do so and counselor will be following with her to see if seeing a therapist or a PCP about possible medications for her mood might be warranted in the near future.     Continued Psychosocial Services Needed Yes  Follow on mood - possibly refer for counseling or PCP for med eval.  Recommended to increase social supports.      Psychosocial Re-Evaluation:  Education: Education Goals: Education classes will be provided on a weekly basis, covering required topics. Participant will state understanding/return demonstration of topics presented.  Learning Barriers/Preferences:     Learning Barriers/Preferences - 04/28/16 1152      Learning Barriers/Preferences   Learning Barriers None   Learning Preferences None      Education Topics: Initial Evaluation Education: - Verbal, written and demonstration of respiratory meds, RPE/PD scales, oximetry and breathing techniques. Instruction on use of nebulizers and MDIs: cleaning and proper use, rinsing mouth with steroid doses and importance of monitoring MDI activations. Flowsheet Row Pulmonary Rehab from 05/04/2016 in [facility] Cardiac and Pulmonary Rehab  Date  04/28/16  Educator  LB  Instruction Review Code  2- meets goals/outcomes      General Nutrition Guidelines/Fats and Fiber: -Group instruction provided by verbal, written material, models and posters to present the general guidelines for heart healthy nutrition. Gives an explanation and review of dietary fats and fiber.   Controlling Sodium/Reading Food Labels: -Group verbal and written material supporting the discussion of sodium use in heart healthy  nutrition. Review and explanation with models, verbal and written materials for utilization of the food label. Flowsheet Row Pulmonary Rehab from 05/04/2016 in [facility] Cardiac and Pulmonary Rehab  Date  05/04/16  Educator  CR  Instruction Review Code  2- meets goals/outcomes      Exercise Physiology & Risk Factors: - Group verbal and written instruction with models to review the exercise physiology of the cardiovascular system and associated critical values. Details cardiovascular disease risk factors and the goals associated with each risk factor.   Aerobic Exercise & Resistance Training: - Gives group verbal and written discussion on the health impact of inactivity. On the components of aerobic and resistive training programs and the benefits of this training and how to safely progress through these programs.   Flexibility, Balance, General Exercise Guidelines: - Provides group verbal and written instruction  on the benefits of flexibility and balance training programs. Provides general exercise guidelines with specific guidelines to those with heart or lung disease. Demonstration and skill practice provided.   Stress Management: - Provides group verbal and written instruction about the health risks of elevated stress, cause of high stress, and healthy ways to reduce stress.   Depression: - Provides group verbal and written instruction on the correlation between heart/lung disease and depressed mood, treatment options, and the stigmas associated with seeking treatment.   Exercise & Equipment Safety: - Individual verbal instruction and demonstration of equipment use and safety with use of the equipment. Flowsheet Row Pulmonary Rehab from 05/04/2016 in [facility] Cardiac and Pulmonary Rehab  Date  05/04/16  Educator  [facility]  Instruction Review Code  2- meets goals/outcomes      Infection Prevention: - Provides verbal and written material to individual with discussion of infection control  including proper hand washing and proper equipment cleaning during exercise session. Flowsheet Row Pulmonary Rehab from 05/04/2016 in [facility] Cardiac and Pulmonary Rehab  Date  05/04/16  Educator  [facility]  Instruction Review Code  2- meets goals/outcomes      Falls Prevention: - Provides verbal and written material to individual with discussion of falls prevention and safety. Flowsheet Row Pulmonary Rehab from 05/04/2016 in [facility] Cardiac and Pulmonary Rehab  Date  04/28/16  Educator  LB  Instruction Review Code  2- meets goals/outcomes      Diabetes: - Individual verbal and written instruction to review signs/symptoms of diabetes, desired ranges of glucose level fasting, after meals and with exercise. Advice that pre and post exercise glucose checks will be done for 3 sessions at entry of program.   Chronic Lung Diseases: - Group verbal and written instruction to review new updates, new respiratory medications, new advancements in procedures and treatments. Provide informative websites and "800" numbers of self-education.   Lung Procedures: - Group verbal and written instruction to describe testing methods done to diagnose lung disease. Review the outcome of test results. Describe the treatment choices: Pulmonary Function Tests, ABGs and oximetry.   Energy Conservation: - Provide group verbal and written instruction for methods to conserve energy, plan and organize activities. Instruct on pacing techniques, use of adaptive equipment and posture/positioning to relieve shortness of breath.   Triggers: - Group verbal and written instruction to review types of environmental controls: home humidity, furnaces, filters, dust mite/pet prevention, HEPA vacuums. To discuss weather changes, air quality and the benefits of nasal washing.   Exacerbations: - Group verbal and written instruction to provide: warning signs, infection symptoms, calling MD promptly, preventive modes, and value of  vaccinations. Review: effective airway clearance, coughing and/or vibration techniques. Create an Sports administrator.   Oxygen: - Individual and group verbal and written instruction on oxygen therapy. Includes supplement oxygen, available portable oxygen systems, continuous and intermittent flow rates, oxygen safety, concentrators, and Medicare reimbursement for oxygen. Flowsheet Row Pulmonary Rehab from 05/04/2016 in [facility] Cardiac and Pulmonary Rehab  Date  04/28/16  Educator  LB  Instruction Review Code  2- meets goals/outcomes      Respiratory Medications: - Group verbal and written instruction to review medications for lung disease. Drug class, frequency, complications, importance of spacers, rinsing mouth after steroid MDI's, and proper cleaning methods for nebulizers. Flowsheet Row Pulmonary Rehab from 05/04/2016 in [facility] Cardiac and Pulmonary Rehab  Date  04/28/16  Educator  LB  Instruction Review Code  2- meets goals/outcomes      AED/CPR: - Group  verbal and written instruction with the use of models to demonstrate the basic use of the AED with the basic ABC's of resuscitation.   Breathing Retraining: - Provides individuals verbal and written instruction on purpose, frequency, and proper technique of diaphragmatic breathing and pursed-lipped breathing. Applies individual practice skills. Flowsheet Row Pulmonary Rehab from 05/04/2016 in [facility] Cardiac and Pulmonary Rehab  Date  05/04/16  Educator  [facility]  Instruction Review Code  2- meets goals/outcomes      Anatomy and Physiology of the Lungs: - Group verbal and written instruction with the use of models to provide basic lung anatomy and physiology related to function, structure and complications of lung disease.   Heart Failure: - Group verbal and written instruction on the basics of heart failure: signs/symptoms, treatments, explanation of ejection fraction, enlarged heart and cardiomyopathy.   Sleep Apnea: - Individual verbal  and written instruction to review Obstructive Sleep Apnea. Review of risk factors, methods for diagnosing and types of masks and machines for OSA.   Anxiety: - Provides group, verbal and written instruction on the correlation between heart/lung disease and anxiety, treatment options, and management of anxiety.   Relaxation: - Provides group, verbal and written instruction about the benefits of relaxation for patients with heart/lung disease. Also provides patients with examples of relaxation techniques.   Knowledge Questionnaire Score:     Knowledge Questionnaire Score - 04/28/16 1152      Knowledge Questionnaire Score   Pre Score 10/10       Core Components/Risk Factors/Patient Goals at Admission:     Personal Goals and Risk Factors at Admission - 04/28/16 1155      Core Components/Risk Factors/Patient Goals on Admission   Sedentary Yes   Intervention Provide advice, education, support and counseling about physical activity/exercise needs.;Develop an individualized exercise prescription for aerobic and resistive training based on initial evaluation findings, risk stratification, comorbidities and participant's personal goals.   Expected Outcomes Achievement of increased cardiorespiratory fitness and enhanced flexibility, muscular endurance and strength shown through measurements of functional capacity and personal statement of participant.   Increase Strength and Stamina Yes   Intervention Provide advice, education, support and counseling about physical activity/exercise needs.;Develop an individualized exercise prescription for aerobic and resistive training based on initial evaluation findings, risk stratification, comorbidities and participant's personal goals.   Expected Outcomes Achievement of increased cardiorespiratory fitness and enhanced flexibility, muscular endurance and strength shown through measurements of functional capacity and personal statement of participant.    Improve shortness of breath with ADL's Yes   Intervention Provide education, individualized exercise plan and daily activity instruction to help decrease symptoms of SOB with activities of daily living.   Expected Outcomes Short Term: Achieves a reduction of symptoms when performing activities of daily living.   Develop more efficient breathing techniques such as purse lipped breathing and diaphragmatic breathing; and practicing self-pacing with activity Yes   Intervention Provide education, demonstration and support about specific breathing techniuqes utilized for more efficient breathing. Include techniques such as pursed lipped breathing, diaphragmatic breathing and self-pacing activity.   Expected Outcomes Short Term: Participant will be able to demonstrate and use breathing techniques as needed throughout daily activities.   Increase knowledge of respiratory medications and ability to use respiratory devices properly  Yes  3l/m oxygen, Advair, Proventil MDI, Spacer given   Intervention Provide education and demonstration as needed of appropriate use of medications, inhalers, and oxygen therapy.   Expected Outcomes Short Term: Achieves understanding of medications use. Understands that oxygen  is a medication prescribed by physician. Demonstrates appropriate use of inhaler and oxygen therapy.   Hypertension Yes   Intervention Provide education on lifestyle modifcations including regular physical activity/exercise, weight management, moderate sodium restriction and increased consumption of fresh fruit, vegetables, and low fat dairy, alcohol moderation, and smoking cessation.;Monitor prescription use compliance.   Expected Outcomes Short Term: Continued assessment and intervention until BP is < 140/76m HG in hypertensive participants. < 130/825mHG in hypertensive participants with diabetes, heart failure or chronic kidney disease.;Long Term: Maintenance of blood pressure at goal levels.      Core  Components/Risk Factors/Patient Goals Review:      Goals and Risk Factor Review    Row Name 05/04/16 1109             Core Components/Risk Factors/Patient Goals Review   Personal Goals Review Develop more efficient breathing techniques such as purse lipped breathing and diaphragmatic breathing and practicing self-pacing with activity.       Review PLB technique was reviewed with the patinet. She demonstrated understanding of this technique.        Expected Outcomes Patient will use PLB to help control Sat's and SOB during exercise and ADL's.           Core Components/Risk Factors/Patient Goals at Discharge (Final Review):      Goals and Risk Factor Review - 05/04/16 1109      Core Components/Risk Factors/Patient Goals Review   Personal Goals Review Develop more efficient breathing techniques such as purse lipped breathing and diaphragmatic breathing and practicing self-pacing with activity.   Review PLB technique was reviewed with the patinet. She demonstrated understanding of this technique.    Expected Outcomes Patient will use PLB to help control Sat's and SOB during exercise and ADL's.       ITP Comments:   Comments: 30 Day Review and patient first day

## 2016-05-06 DIAGNOSIS — J449 Chronic obstructive pulmonary disease, unspecified: Secondary | ICD-10-CM

## 2016-05-06 NOTE — Progress Notes (Signed)
Daily Session Note  Patient Details  Name: Bethany [name] MRN: [id number] Date of Birth: 1935-07-01 Referring Provider:   Flowsheet Row Pulmonary Rehab from 04/28/2016 in [facility] Cardiac and Pulmonary Rehab  Referring Provider  Chasnis      Encounter Date: 05/06/2016  Check In:     Session Check In - 05/06/16 1143      Check-In   Location [facility]-Cardiac & Pulmonary Rehab   Staff Present Carson Myrtle, BS, RRT, Respiratory Therapist;Carroll Enterkin, RN, Vickki Hearing, BA, ACSM CEP, Exercise Physiologist   Supervising physician immediately available to respond to emergencies [name] immediately available ER MD   Physician(s) Jimmye Norman and Reita Cliche   Medication changes reported     No   Fall or balance concerns reported    No   Warm-up and Cool-down Performed as group-led Location manager Performed Yes   VAD Patient? No     Pain Assessment   Currently in Pain? No/denies         Goals Met:  Proper associated with RPD/PD & O2 Sat Independence with exercise equipment Exercise tolerated well Strength training completed today  Goals Unmet:  Not Applicable  Comments: Pt able to follow exercise prescription today without complaint.  Will continue to monitor for progression.    Dr. Emily Filbert is Medical Director for [place] and [name].

## 2016-05-07 NOTE — Progress Notes (Signed)
Daily Session Note  Patient Details  Name: Bethany [name] MRN: [id number] Date of Birth: [birth year] Referring Provider:   Flowsheet Row Pulmonary Rehab from 04/28/2016 in [facility] Cardiac and Pulmonary Rehab  Referring Provider  Chasnis      Encounter Date: 04/28/2016  Check In:      Goals Met:  Proper associated with RPD/PD & O2 Sat Exercise tolerated well Strength training completed today  Goals Unmet:  Not Applicable  Comments: Pt able to follow exercise prescription today without complaint.  Will continue to monitor for progression.    Dr. Emily Filbert is Medical Director for [place] and [name].

## 2016-05-08 ENCOUNTER — Encounter: Payer: Commercial Managed Care - HMO | Attending: Physical Medicine and Rehabilitation | Admitting: *Deleted

## 2016-05-08 DIAGNOSIS — J449 Chronic obstructive pulmonary disease, unspecified: Secondary | ICD-10-CM | POA: Insufficient documentation

## 2016-05-08 NOTE — Progress Notes (Signed)
Daily Session Note  Patient Details  Name: Bethany [name] MRN: [id number] Date of Birth: 09/15/1935 Referring Provider:   April Manson Pulmonary Rehab from 04/28/2016 in [facility] Cardiac and Pulmonary Rehab  Referring Provider  Chasnis      Encounter Date: 05/08/2016  Check In:     Session Check In - 05/08/16 1049      Check-In   Location [facility]-Cardiac & Pulmonary Rehab   Staff Present Gerlene Burdock, RN, Vickki Hearing, BA, ACSM CEP, Exercise Physiologist  Levell July, RN BSN   Supervising physician immediately available to respond to emergencies [name] immediately available ER MD   Physician(s) Drs. Alfred Levins and [employer]   Medication changes reported     No   Fall or balance concerns reported    No   Warm-up and Cool-down Performed as group-led Location manager Performed Yes   VAD Patient? No     Pain Assessment   Currently in Pain? No/denies   Multiple Pain Sites No         Goals Met:  Proper associated with RPD/PD & O2 Sat Independence with exercise equipment Using PLB without cueing & demonstrates good technique No report of cardiac concerns or symptoms Strength training completed today  Goals Unmet:  Not Applicable  Comments: Pt able to follow exercise prescription today without complaint.  Will continue to monitor for progression.    Dr. Emily Filbert is Medical Director for [place] and [name].

## 2016-05-11 ENCOUNTER — Encounter: Payer: Commercial Managed Care - HMO | Admitting: *Deleted

## 2016-05-11 DIAGNOSIS — J449 Chronic obstructive pulmonary disease, unspecified: Secondary | ICD-10-CM

## 2016-05-11 NOTE — Progress Notes (Signed)
Daily Session Note  Patient Details  Name: Bethany [name] MRN: [id number] Date of Birth: 02-19-1936 Referring Provider:   Flowsheet Row Pulmonary Rehab from 04/28/2016 in [facility] Cardiac and Pulmonary Rehab  Referring Provider  Chasnis      Encounter Date: 05/11/2016  Check In:     Session Check In - 05/11/16 1121      Check-In   Location [facility]-Cardiac & Pulmonary Rehab   Staff Present Carson Myrtle, BS, RRT, Respiratory Therapist;Leeana Creer Amedeo Plenty, BS, ACSM CEP, Exercise Physiologist;Amanda Oletta Darter, BA, ACSM CEP, Exercise Physiologist   Supervising physician immediately available to respond to emergencies [name] immediately available ER MD   Physician(s) Jimmye Norman and Mariea Clonts   Medication changes reported     No   Fall or balance concerns reported    No   Warm-up and Cool-down Performed on first and last piece of equipment   Resistance Training Performed Yes   VAD Patient? No     Pain Assessment   Currently in Pain? No/denies   Multiple Pain Sites No         Goals Met:  Proper associated with RPD/PD & O2 Sat Independence with exercise equipment Exercise tolerated well Strength training completed today  Goals Unmet:  Not Applicable  Comments: Pt able to follow exercise prescription today without complaint.  Will continue to monitor for progression.    Dr. Emily Filbert is Medical Director for [place] and [name].

## 2016-05-13 DIAGNOSIS — J449 Chronic obstructive pulmonary disease, unspecified: Secondary | ICD-10-CM

## 2016-05-13 NOTE — Progress Notes (Signed)
Daily Session Note  Patient Details  Name: Bethany [name] MRN: [id number] Date of Birth: Nov 01, 1935 Referring Provider:   Flowsheet Row Pulmonary Rehab from 04/28/2016 in [facility] Cardiac and Pulmonary Rehab  Referring Provider  Chasnis      Encounter Date: 05/13/2016  Check In:     Session Check In - 05/13/16 1237      Check-In   Location [facility]-Cardiac & Pulmonary Rehab   Staff Present Carson Myrtle, BS, RRT, Respiratory Lennie Hummer, MA, ACSM RCEP, Exercise Physiologist;Amanda Oletta Darter, BA, ACSM CEP, Exercise Physiologist   Supervising physician immediately available to respond to emergencies [name] immediately available ER MD   Physician(s) Burlene Arnt and Scha[place]   Medication changes reported     No   Fall or balance concerns reported    No   Warm-up and Cool-down Performed as group-led instruction   Resistance Training Performed Yes   VAD Patient? No     Pain Assessment   Currently in Pain? No/denies   Multiple Pain Sites No         Goals Met:  Proper associated with RPD/PD & O2 Sat Independence with exercise equipment Exercise tolerated well Strength training completed today  Goals Unmet:  Not Applicable  Comments: Pt able to follow exercise prescription today without complaint.  Will continue to monitor for progression.    Dr. Emily Filbert is Medical Director for [place] and [name].

## 2016-05-14 ENCOUNTER — Other Ambulatory Visit: Payer: Self-pay | Admitting: *Deleted

## 2016-05-14 NOTE — Patient Outreach (Signed)
[facility]) Care Management  05/14/2016  Bethany [name] Oct 05, 1935 [id number]  Telephone follow up   Spoke with patient reports she is doing good, exercising 3 days a week in pulmonary rehab. Patient states she can tell a difference in how she feels and how much she is able to do since starting therapy.Patient able to drive to therapy on Monday , Wednesday and Friday. Patient discussed she has a dental appointment in [place] on tomorrow and her brother will provide transportation .  Patient denies any new concerns at this time.  Discussed goals that have been met and plans for case closure at next home visit, patient agreeable.  Plan Home visit scheduled  in the next 2 weeks,and case closure if no new concerns to be addressed.   Joylene Draft, RN, [place] [facility] [phone]- Mobile [phone]- Toll Free Main Office

## 2016-05-18 ENCOUNTER — Encounter: Payer: Commercial Managed Care - HMO | Admitting: *Deleted

## 2016-05-18 DIAGNOSIS — J449 Chronic obstructive pulmonary disease, unspecified: Secondary | ICD-10-CM | POA: Diagnosis not present

## 2016-05-18 NOTE — Progress Notes (Signed)
Daily Session Note  Patient Details  Name: Bethany [name] MRN: [id number] Date of Birth: [birth year] Referring Provider:   Flowsheet Row Pulmonary Rehab from 04/28/2016 in [facility] Cardiac and Pulmonary Rehab  Referring Provider  Chasnis      Encounter Date: 05/18/2016  Check In:     Session Check In - 05/18/16 1016      Check-In   Location [facility]-Cardiac & Pulmonary Rehab   Staff Present Carson Myrtle, BS, RRT, Respiratory Therapist;Judah Carchi Amedeo Plenty, BS, ACSM CEP, Exercise Physiologist;Amanda Oletta Darter, BA, ACSM CEP, Exercise Physiologist   Supervising physician immediately available to respond to emergencies [name] immediately available ER MD   Physician(s) Clearnce Hasten and Joni Fears   Medication changes reported     No   Fall or balance concerns reported    No   Warm-up and Cool-down Performed on first and last piece of equipment   Resistance Training Performed Yes   VAD Patient? No     Pain Assessment   Currently in Pain? No/denies   Multiple Pain Sites No         Goals Met:  Proper associated with RPD/PD & O2 Sat Independence with exercise equipment Exercise tolerated well Personal goals reviewed Strength training completed today  Goals Unmet:  Not Applicable  Comments: Pt able to follow exercise prescription today without complaint.  Will continue to monitor for progression.    Dr. Emily Filbert is Medical Director for [place] and [name].

## 2016-05-20 DIAGNOSIS — J449 Chronic obstructive pulmonary disease, unspecified: Secondary | ICD-10-CM

## 2016-05-20 NOTE — Progress Notes (Signed)
Daily Session Note  Patient Details  Name: Bethany [name] MRN: [id number] Date of Birth: 01-28-1936 Referring Provider:   April Manson Pulmonary Rehab from 04/28/2016 in [facility] Cardiac and Pulmonary Rehab  Referring Provider  Chasnis      Encounter Date: 05/20/2016  Check In:     Session Check In - 05/20/16 1017      Check-In   Location [facility]-Cardiac & Pulmonary Rehab   Staff Present Alberteen Sam, MA, ACSM RCEP, Exercise Physiologist;Kileigh Ortmann Oletta Darter, BA, ACSM CEP, Exercise Physiologist;Laureen Owens Shark, BS, RRT, Respiratory Therapist   Supervising physician immediately available to respond to emergencies [name] immediately available ER MD   Physician(s) Drs. Marcelene [place]te and McShane   Medication changes reported     No   Fall or balance concerns reported    No   Warm-up and Cool-down Performed on first and last piece of equipment   Resistance Training Performed Yes   VAD Patient? No     Pain Assessment   Currently in Pain? No/denies   Multiple Pain Sites No         Goals Met:  Proper associated with RPD/PD & O2 Sat Independence with exercise equipment Exercise tolerated well Strength training completed today  Goals Unmet:  Not Applicable  Comments: Reviewed home exercise with pt today.  Pt plans to walk with friends for exercise.  Reviewed THR, pulse, RPE, sign and symptoms, NTG use, and when to call 911 or MD.  Also discussed weather considerations and indoor options.  Pt voiced understanding.    Dr. Emily Filbert is Medical Director for [place] and [name].

## 2016-05-22 DIAGNOSIS — J449 Chronic obstructive pulmonary disease, unspecified: Secondary | ICD-10-CM

## 2016-05-22 NOTE — Progress Notes (Signed)
Daily Session Note  Patient Details  Name: Bethany [name] MRN: [id number] Date of Birth: [birth year] Referring Provider:   Flowsheet Row Pulmonary Rehab from 04/28/2016 in [facility] Cardiac and Pulmonary Rehab  Referring Provider  Chasnis      Encounter Date: 05/22/2016  Check In:     Session Check In - 05/22/16 1150      Check-In   Location [facility]-Cardiac & Pulmonary Rehab   Staff Present Nyoka Cowden, RN, BSN, Kela Millin, BA, ACSM CEP, Exercise Physiologist;Other  Jena Gauss RN   Supervising physician immediately available to respond to emergencies [name] immediately available ER MD   Physician(s) Jimmye Norman and Alfred Levins   Medication changes reported     No   Fall or balance concerns reported    No   Warm-up and Cool-down Performed as group-led Location manager Performed Yes   VAD Patient? No     Pain Assessment   Currently in Pain? No/denies         Goals Met:  Proper associated with RPD/PD & O2 Sat Independence with exercise equipment Exercise tolerated well Strength training completed today  Goals Unmet:  Not Applicable  Comments: Pt able to follow exercise prescription today without complaint.  Will continue to monitor for progression.    Dr. Emily Filbert is Medical Director for [place] and [name].

## 2016-05-25 ENCOUNTER — Encounter: Payer: Commercial Managed Care - HMO | Admitting: *Deleted

## 2016-05-25 ENCOUNTER — Encounter: Payer: Self-pay | Admitting: Respiratory Therapy

## 2016-05-25 DIAGNOSIS — J449 Chronic obstructive pulmonary disease, unspecified: Secondary | ICD-10-CM

## 2016-05-25 NOTE — Progress Notes (Signed)
Pulmonary Individual Treatment Plan  Patient Details  Name: Bethany [name] MRN: [id number] Date of Birth: [birth year] Referring Provider:   Flowsheet Row Pulmonary Rehab from 04/28/2016 in [facility] Cardiac and Pulmonary Rehab  Referring Provider  Chasnis      Initial Encounter Date:  Flowsheet Row Pulmonary Rehab from 04/28/2016 in [facility] Cardiac and Pulmonary Rehab  Date  04/28/16  Referring Provider  Chasnis      Visit Diagnosis: Chronic obstructive pulmonary disease, unspecified COPD type ([place])  Patient's Home Medications on Admission:  Current Outpatient Prescriptions:    albuterol (PROVENTIL HFA;VENTOLIN HFA) 108 (90 Base) MCG/ACT inhaler, Inhale 2 puffs into the lungs every 6 (six) hours as needed for wheezing or shortness of breath., Disp: 1 Inhaler, Rfl: 2   aspirin (ASPIRIN EC) 81 MG EC tablet, Take 81 mg by mouth daily. Swallow whole., Disp: , Rfl:    azithromycin (ZITHROMAX Z-PAK) 250 MG tablet, Take 2 tablets (500 mg) on  Day 1,  followed by 1 tablet (250 mg) once daily on Days 2 through 5. (Patient not taking: Reported on 04/23/2016), Disp: 6 each, Rfl: 0   baclofen (LIORESAL) 10 MG tablet, Take 10 mg by mouth 3 (three) times daily., Disp: , Rfl:    cetirizine (ZYRTEC) 10 MG tablet, Take 10 mg by mouth daily., Disp: , Rfl:    diazepam (VALIUM) 5 MG tablet, Take 5 mg by mouth at bedtime. Patient takes once at night Reported on 10/16/2015, Disp: , Rfl:    docusate sodium (COLACE) 100 MG capsule, Take 100 mg by mouth 2 (two) times daily as needed for mild constipation., Disp: , Rfl:    escitalopram (LEXAPRO) 5 MG tablet, Take 10 mg by mouth daily. , Disp: , Rfl:    Fluticasone-Salmeterol (ADVAIR) 250-50 MCG/DOSE AEPB, Inhale 1 puff into the lungs 2 (two) times daily., Disp: , Rfl:    gabapentin (NEURONTIN) 300 MG capsule, Take 300 mg by mouth 3 (three) times daily., Disp: , Rfl:    HYDROcodone-acetaminophen (NORCO/VICODIN) 5-325 MG tablet, Take 1 tablet by mouth 5 (five)  times daily as needed for moderate pain. Do not exceed 5 times daily, patient at least 3 day, Disp: , Rfl:    losartan (COZAAR) 50 MG tablet, Take 50 mg by mouth daily., Disp: , Rfl:    lovastatin (MEVACOR) 20 MG tablet, Take 20 mg by mouth at bedtime. Reported on 12/12/2015, Disp: , Rfl:    metoprolol succinate (TOPROL-XL) 25 MG 24 hr tablet, Take 25 mg by mouth daily., Disp: , Rfl:    montelukast (SINGULAIR) 10 MG tablet, Take 10 mg by mouth at bedtime. Reported on 12/12/2015, Disp: , Rfl:    predniSONE (STERAPRED UNI-PAK 21 TAB) 10 MG (21) TBPK tablet, Dispense steroid taper pack as directed (Patient not taking: Reported on 04/23/2016), Disp: 21 tablet, Rfl: 0   tamsulosin (FLOMAX) 0.4 MG CAPS capsule, Take 0.4 mg by mouth daily after supper., Disp: , Rfl:    vitamin B-12 (CYANOCOBALAMIN) 1000 MCG tablet, Take 1,000 mcg by mouth daily., Disp: , Rfl:   Past Medical History: Past Medical History:  Diagnosis Date   Allergy    Anxiety    Arthritis    COPD (chronic obstructive pulmonary disease) ([place])    Hyperlipidemia    Hypertension    Oxygen deficiency     Tobacco Use: History  Smoking Status   Former Smoker   Quit date: 10/06/2014  Smokeless Tobacco   Never Used    Labs: Recent Review Scientist, physiological  There is no flowsheet data to display.       ADL UCSD:     Pulmonary Assessment Scores    Row Name 04/28/16 1154         ADL UCSD   ADL Phase Entry     SOB Score total 82     Rest 1     Walk 3     Stairs 5     Bath 5     Dress 3     Shop 2        Pulmonary Function Assessment:     Pulmonary Function Assessment - 04/28/16 1152      Pulmonary Function Tests   FVC% 41 %   FEV1% 39 %   FEV1/FVC Ratio 70.49     Initial Spirometry Results   Comments Test date 04/28/16     Breath   Bilateral Breath Sounds Clear   Shortness of Breath Yes;Limiting activity;Fear of Shortness of Breath      Exercise Target Goals:    Exercise Program  Goal: Individual exercise prescription set with THRR, safety & activity barriers. Participant demonstrates ability to understand and report RPE using BORG scale, to self-measure pulse accurately, and to acknowledge the importance of the exercise prescription.  Exercise Prescription Goal: Starting with aerobic activity 30 plus minutes a day, 3 days per week for initial exercise prescription. Provide home exercise prescription and guidelines that participant acknowledges understanding prior to discharge.  Activity Barriers & Risk Stratification:     Activity Barriers & Cardiac Risk Stratification - 04/28/16 1151      Activity Barriers & Cardiac Risk Stratification   Activity Barriers Back Problems;Shortness of Breath;Deconditioning   Cardiac Risk Stratification Moderate      6 Minute Walk:     6 Minute Walk    Row Name 04/28/16 1228         6 Minute Walk   Distance 675 feet     Walk Time 5.4 minutes     # of Rest Breaks 3     MPH 1.42     METS 2.07     VO2 Peak 5.97     Symptoms No     Resting HR 73 bpm     Resting BP 128/70     Max Ex. HR 112 bpm     Max Ex. BP 148/84       Interval HR   Baseline HR 73     1 Minute HR 106     2 Minute HR 103     3 Minute HR 109     4 Minute HR 105     5 Minute HR 112     6 Minute HR 112     2 Minute Post HR 96     Interval Heart Rate? Yes        Initial Exercise Prescription:     Initial Exercise Prescription - 04/28/16 1200      Date of Initial Exercise RX and Referring Provider   Date 04/28/16   Referring Provider Chasnis     Oxygen   Oxygen Continuous   Liters 3     Treadmill   MPH 1.4   Grade 0   Minutes 15  3/3/3/3   METs 2     NuStep   Level 2   Minutes 15   METs 2     Biostep-RELP   Level 2   Minutes 15   METs 2     Prescription  Details   Frequency (times per week) 3   Duration Progress to 45 minutes of aerobic exercise without signs/symptoms of physical distress     Intensity   THRR 40-80%  of Max Heartrate 100-128   Ratings of Perceived Exertion 11-13   Perceived Dyspnea 0-4     Progression   Progression Continue to progress workloads to maintain intensity without signs/symptoms of physical distress.     Resistance Training   Training Prescription Yes   Weight 2   Reps 10-12      Perform Capillary Blood Glucose checks as needed.  Exercise Prescription Changes:     Exercise Prescription Changes    Row Name 05/04/16 1100 05/13/16 1200 05/20/16 1200         Exercise Review   Progression  -- No  --       Response to Exercise   Blood Pressure (Admit) 111/58 114/60  --     Blood Pressure (Exercise) 142/70 126/64  --     Blood Pressure (Exit) 132/72 124/70  --     Heart Rate (Admit) 79 bpm 64 bpm  --     Heart Rate (Exercise) 89 bpm 108 bpm  --     Heart Rate (Exit) 69 bpm 60 bpm  --     Oxygen Saturation (Admit) 99 % 96 %  --     Oxygen Saturation (Exercise) 94 % 96 %  --     Oxygen Saturation (Exit) 99 % 97 %  --     Rating of Perceived Exertion (Exercise) 14 13  --     Perceived Dyspnea (Exercise) 3 4  --     Symptoms  -- yes  --     Comments  -- back pain  Pain is next day after exercise - staff will monitor  --     Duration  -- Progress to 45 minutes of aerobic exercise without signs/symptoms of physical distress  --     Intensity  -- THRR unchanged  --       Progression   Progression  -- Continue to progress workloads to maintain intensity without signs/symptoms of physical distress.  --     Average METs  -- 2  --       Resistance Training   Training Prescription Yes Yes  --     Weight 2 2  --     Reps 10-12 10-12  --       Interval Training   Interval Training  -- No  --       Oxygen   Oxygen Continuous Continuous  --     Liters 3 3  --       Treadmill   MPH 1.4 1.4  --     Grade 0 0  --     Minutes 15  3/3/3/3 15  3/3  --     METs 2 2  --       NuStep   Level 2 3  --     Minutes 15 15  --     METs 2 2.1  --       Biostep-RELP    Level 2 2  --     Minutes 15 15  --     METs 2 2  --       Home Exercise Plan   Plans to continue exercise at  --  -- Home     Frequency  --  -- Add 2 additional  days to program exercise sessions.        Exercise Comments:     Exercise Comments    Row Name 05/04/16 1109 05/13/16 1248 05/20/16 1212       Exercise Comments First full day of exercise!  Patient was oriented to gym and equipment including functions, settings, policies, and procedures.  Patient's individual exercise prescription and treatment plan were reviewed.  All starting workloads were established based on the results of the 6 minute walk test done at initial orientation visit.  The plan for exercise progression was also introduced and progression will be customized based on patient's performance and goals Emmagrace has had some back pain the day after exercise.  Delayed muscle soreness was discussed and staff will continue to check in with her.  Stretches and general strength should be helpful. Reviewed home exercise with pt today.  Pt plans to walk with friends for exercise.  Reviewed THR, pulse, RPE, sign and symptoms, NTG use, and when to call 911 or MD.  Also discussed weather considerations and indoor options.  Pt voiced understanding.        Discharge Exercise Prescription (Final Exercise Prescription Changes):     Exercise Prescription Changes - 05/20/16 1200      Home Exercise Plan   Plans to continue exercise at Home   Frequency Add 2 additional days to program exercise sessions.       Nutrition:  Target Goals: Understanding of nutrition guidelines, daily intake of sodium <1556m, cholesterol <2060m calories 30% from fat and 7% or less from saturated fats, daily to have 5 or more servings of fruits and vegetables.  Biometrics:     Pre Biometrics - 04/28/16 1227      Pre Biometrics   Height 5' 5"  (1.651 m)   Weight 152 lb 12.8 oz (69.3 kg)   Waist Circumference 36.5 inches   Hip Circumference  40.75 inches   Waist to Hip Ratio 0.9 %   BMI (Calculated) 25.5       Nutrition Therapy Plan and Nutrition Goals:   Nutrition Discharge: Rate Your Plate Scores:   Psychosocial: Target Goals: Acknowledge presence or absence of depression, maximize coping skills, provide positive support system. Participant is able to verbalize types and ability to use techniques and skills needed for reducing stress and depression.  Initial Review & Psychosocial Screening:     Initial Psych Review & Screening - 04/28/16 1157      Family Dynamics   Comments Ms HoMentors dealing with the lose of her best friend who passed away last week. Her friend had been ill for some time, so she is comforted that she is not suffering anymore. Ms HoRebertates [name] will benfit her to be more active and help her to move on from her friend's lose.her activity and help h      Quality of Life Scores:     Quality of Life - 04/28/16 1203      Quality of Life Scores   Health/Function Pre 21 %   Socioeconomic Pre 20.29 %   Psych/Spiritual Pre 21 %   Family Pre 21 %   GLOBAL Pre 20.84 %      PHQ-9: Recent Review Flowsheet Data    [facility] 04/28/2016 01/01/2016 12/12/2015 11/12/2015 10/16/2015   Decreased Interest 2 0 0 0 0   Down, Depressed, Hopeless 1 0 0 0 0   PHQ - 2 Score 3 0 0 0 0   Altered sleeping 2 - - - -  Tired, decreased energy 3 - - - -   Change in appetite 3 - - - -   Feeling bad or failure about yourself  1 - - - -   Trouble concentrating 2 - - - -   Moving slowly or fidgety/restless 0 - - - -   Suicidal thoughts 0 - - - -   PHQ-9 Score 14 - - - -   Difficult doing work/chores Somewhat difficult - - - -      Psychosocial Evaluation and Intervention:     Psychosocial Evaluation - 05/04/16 1037      Psychosocial Evaluation & Interventions   Interventions Encouraged to exercise with the program and follow exercise prescription   Comments Counselor met with Ms. Cara today  for initial psychosocial evaluation.  She is a 80 year old female who was diagnosed with COPD in 2015.  Her support system is limited at this time with a best friend who recently passed away and a brother who lived with Ms. Lemmie Evens recently moved out.  She has close neighbors and realizes she needs to get back involved with her local church Sunday [employer] class.  She has had multiple back surgeries and has fallen (4) times in the past several years; the last time was one month ago.  She reports both her sleep and her appetite have been poor but are improving somewhat recently.  She denies a history of depression or anxiety; however, Counselor discussed her PHQ-9 Scores of "14" indicating moderate depression with her.  She admitted since her friend passed this has been very difficult for her.  She states her stressors include just not being able to do those things she used to enjoy.  She has goals to improve her breathing and flexibility and to "quit falling."  Counselor encouraged her to increase her social supports to improve her mood and her feeling of isolation with the recent losses.  She agreed to do so and counselor will be following with her to see if seeing a therapist or a PCP about possible medications for her mood might be warranted in the near future.     Continued Psychosocial Services Needed Yes  Follow on mood - possibly refer for counseling or PCP for med eval.  Recommended to increase social supports.      Psychosocial Re-Evaluation:  Education: Education Goals: Education classes will be provided on a weekly basis, covering required topics. Participant will state understanding/return demonstration of topics presented.  Learning Barriers/Preferences:     Learning Barriers/Preferences - 04/28/16 1152      Learning Barriers/Preferences   Learning Barriers None   Learning Preferences None      Education Topics: Initial Evaluation Education: - Verbal, written and demonstration of  respiratory meds, RPE/PD scales, oximetry and breathing techniques. Instruction on use of nebulizers and MDIs: cleaning and proper use, rinsing mouth with steroid doses and importance of monitoring MDI activations. Flowsheet Row Pulmonary Rehab from 05/22/2016 in [facility] Cardiac and Pulmonary Rehab  Date  04/28/16  Educator  LB  Instruction Review Code  2- meets goals/outcomes      General Nutrition Guidelines/Fats and Fiber: -Group instruction provided by verbal, written material, models and posters to present the general guidelines for heart healthy nutrition. Gives an explanation and review of dietary fats and fiber.   Controlling Sodium/Reading Food Labels: -Group verbal and written material supporting the discussion of sodium use in heart healthy nutrition. Review and explanation with models, verbal and written materials for utilization of  the food label. Flowsheet Row Pulmonary Rehab from 05/22/2016 in [facility] Cardiac and Pulmonary Rehab  Date  05/04/16  Educator  CR  Instruction Review Code  2- meets goals/outcomes      Exercise Physiology & Risk Factors: - Group verbal and written instruction with models to review the exercise physiology of the cardiovascular system and associated critical values. Details cardiovascular disease risk factors and the goals associated with each risk factor.   Aerobic Exercise & Resistance Training: - Gives group verbal and written discussion on the health impact of inactivity. On the components of aerobic and resistive training programs and the benefits of this training and how to safely progress through these programs.   Flexibility, Balance, General Exercise Guidelines: - Provides group verbal and written instruction on the benefits of flexibility and balance training programs. Provides general exercise guidelines with specific guidelines to those with heart or lung disease. Demonstration and skill practice provided.   Stress Management: - Provides  group verbal and written instruction about the health risks of elevated stress, cause of high stress, and healthy ways to reduce stress.   Depression: - Provides group verbal and written instruction on the correlation between heart/lung disease and depressed mood, treatment options, and the stigmas associated with seeking treatment. Flowsheet Row Pulmonary Rehab from 05/22/2016 in [facility] Cardiac and Pulmonary Rehab  Date  05/11/16  Educator  [facility]  Instruction Review Code  2- meets goals/outcomes      Exercise & Equipment Safety: - Individual verbal instruction and demonstration of equipment use and safety with use of the equipment. Flowsheet Row Pulmonary Rehab from 05/22/2016 in [facility] Cardiac and Pulmonary Rehab  Date  05/04/16  Educator  [facility]  Instruction Review Code  2- meets goals/outcomes      Infection Prevention: - Provides verbal and written material to individual with discussion of infection control including proper hand washing and proper equipment cleaning during exercise session. Flowsheet Row Pulmonary Rehab from 05/22/2016 in [facility] Cardiac and Pulmonary Rehab  Date  05/04/16  Educator  [facility]  Instruction Review Code  2- meets goals/outcomes      Falls Prevention: - Provides verbal and written material to individual with discussion of falls prevention and safety. Flowsheet Row Pulmonary Rehab from 05/22/2016 in [facility] Cardiac and Pulmonary Rehab  Date  04/28/16  Educator  LB  Instruction Review Code  2- meets goals/outcomes      Diabetes: - Individual verbal and written instruction to review signs/symptoms of diabetes, desired ranges of glucose level fasting, after meals and with exercise. Advice that pre and post exercise glucose checks will be done for 3 sessions at entry of program.   Chronic Lung Diseases: - Group verbal and written instruction to review new updates, new respiratory medications, new advancements in procedures and treatments. Provide informative  websites and "800" numbers of self-education.   Lung Procedures: - Group verbal and written instruction to describe testing methods done to diagnose lung disease. Review the outcome of test results. Describe the treatment choices: Pulmonary Function Tests, ABGs and oximetry.   Energy Conservation: - Provide group verbal and written instruction for methods to conserve energy, plan and organize activities. Instruct on pacing techniques, use of adaptive equipment and posture/positioning to relieve shortness of breath.   Triggers: - Group verbal and written instruction to review types of environmental controls: home humidity, furnaces, filters, dust mite/pet prevention, HEPA vacuums. To discuss weather changes, air quality and the benefits of nasal washing.   Exacerbations: - Group verbal and written  instruction to provide: warning signs, infection symptoms, calling MD promptly, preventive modes, and value of vaccinations. Review: effective airway clearance, coughing and/or vibration techniques. Create an Sports administrator. Flowsheet Row Pulmonary Rehab from 05/22/2016 in [facility] Cardiac and Pulmonary Rehab  Date  05/13/16  Educator  LB  Instruction Review Code  2- meets goals/outcomes      Oxygen: - Individual and group verbal and written instruction on oxygen therapy. Includes supplement oxygen, available portable oxygen systems, continuous and intermittent flow rates, oxygen safety, concentrators, and Medicare reimbursement for oxygen. Flowsheet Row Pulmonary Rehab from 05/22/2016 in [facility] Cardiac and Pulmonary Rehab  Date  04/28/16  Educator  LB  Instruction Review Code  2- meets goals/outcomes      Respiratory Medications: - Group verbal and written instruction to review medications for lung disease. Drug class, frequency, complications, importance of spacers, rinsing mouth after steroid MDI's, and proper cleaning methods for nebulizers. Flowsheet Row Pulmonary Rehab from 05/22/2016 in [facility]  Cardiac and Pulmonary Rehab  Date  04/28/16  Educator  LB  Instruction Review Code  2- meets goals/outcomes      AED/CPR: - Group verbal and written instruction with the use of models to demonstrate the basic use of the AED with the basic ABC's of resuscitation.   Breathing Retraining: - Provides individuals verbal and written instruction on purpose, frequency, and proper technique of diaphragmatic breathing and pursed-lipped breathing. Applies individual practice skills. Flowsheet Row Pulmonary Rehab from 05/22/2016 in [facility] Cardiac and Pulmonary Rehab  Date  05/04/16  Educator  [facility]  Instruction Review Code  2- meets goals/outcomes      Anatomy and Physiology of the Lungs: - Group verbal and written instruction with the use of models to provide basic lung anatomy and physiology related to function, structure and complications of lung disease. Flowsheet Row Pulmonary Rehab from 05/22/2016 in [facility] Cardiac and Pulmonary Rehab  Date  05/06/16  Educator  LB  Instruction Review Code  2- meets goals/outcomes      Heart Failure: - Group verbal and written instruction on the basics of heart failure: signs/symptoms, treatments, explanation of ejection fraction, enlarged heart and cardiomyopathy. Flowsheet Row Pulmonary Rehab from 05/22/2016 in [facility] Cardiac and Pulmonary Rehab  Date  05/22/16  Educator  SB  Instruction Review Code  2- meets goals/outcomes      Sleep Apnea: - Individual verbal and written instruction to review Obstructive Sleep Apnea. Review of risk factors, methods for diagnosing and types of masks and machines for OSA.   Anxiety: - Provides group, verbal and written instruction on the correlation between heart/lung disease and anxiety, treatment options, and management of anxiety.   Relaxation: - Provides group, verbal and written instruction about the benefits of relaxation for patients with heart/lung disease. Also provides patients with examples of relaxation  techniques.   Knowledge Questionnaire Score:     Knowledge Questionnaire Score - 04/28/16 1152      Knowledge Questionnaire Score   Pre Score 10/10       Core Components/Risk Factors/Patient Goals at Admission:     Personal Goals and Risk Factors at Admission - 04/28/16 1155      Core Components/Risk Factors/Patient Goals on Admission   Sedentary Yes   Intervention Provide advice, education, support and counseling about physical activity/exercise needs.;Develop an individualized exercise prescription for aerobic and resistive training based on initial evaluation findings, risk stratification, comorbidities and participant's personal goals.   Expected Outcomes Achievement of increased cardiorespiratory fitness and enhanced flexibility, muscular endurance and  strength shown through measurements of functional capacity and personal statement of participant.   Increase Strength and Stamina Yes   Intervention Provide advice, education, support and counseling about physical activity/exercise needs.;Develop an individualized exercise prescription for aerobic and resistive training based on initial evaluation findings, risk stratification, comorbidities and participant's personal goals.   Expected Outcomes Achievement of increased cardiorespiratory fitness and enhanced flexibility, muscular endurance and strength shown through measurements of functional capacity and personal statement of participant.   Improve shortness of breath with ADL's Yes   Intervention Provide education, individualized exercise plan and daily activity instruction to help decrease symptoms of SOB with activities of daily living.   Expected Outcomes Short Term: Achieves a reduction of symptoms when performing activities of daily living.   Develop more efficient breathing techniques such as purse lipped breathing and diaphragmatic breathing; and practicing self-pacing with activity Yes   Intervention Provide education,  demonstration and support about specific breathing techniuqes utilized for more efficient breathing. Include techniques such as pursed lipped breathing, diaphragmatic breathing and self-pacing activity.   Expected Outcomes Short Term: Participant will be able to demonstrate and use breathing techniques as needed throughout daily activities.   Increase knowledge of respiratory medications and ability to use respiratory devices properly  Yes  3l/m oxygen, Advair, Proventil MDI, Spacer given   Intervention Provide education and demonstration as needed of appropriate use of medications, inhalers, and oxygen therapy.   Expected Outcomes Short Term: Achieves understanding of medications use. Understands that oxygen is a medication prescribed by physician. Demonstrates appropriate use of inhaler and oxygen therapy.   Hypertension Yes   Intervention Provide education on lifestyle modifcations including regular physical activity/exercise, weight management, moderate sodium restriction and increased consumption of fresh fruit, vegetables, and low fat dairy, alcohol moderation, and smoking cessation.;Monitor prescription use compliance.   Expected Outcomes Short Term: Continued assessment and intervention until BP is < 140/53m HG in hypertensive participants. < 130/881mHG in hypertensive participants with diabetes, heart failure or chronic kidney disease.;Long Term: Maintenance of blood pressure at goal levels.      Core Components/Risk Factors/Patient Goals Review:      Goals and Risk Factor Review    Row Name 05/04/16 1109 05/18/16 1341           Core Components/Risk Factors/Patient Goals Review   Personal Goals Review Develop more efficient breathing techniques such as purse lipped breathing and diaphragmatic breathing and practicing self-pacing with activity. Increase Strength and Stamina      Review PLB technique was reviewed with the patinet. She demonstrated understanding of this technique.   ShSharones feeling good about exercise - it is "not nearly as hard" as when she first started.        Expected Outcomes Patient will use PLB to help control Sat's and SOB during exercise and ADL's.  ShAlnitaill continue to see imporvent un her overall fitness and endurance with regular participation.         Core Components/Risk Factors/Patient Goals at Discharge (Final Review):      Goals and Risk Factor Review - 05/18/16 1341      Core Components/Risk Factors/Patient Goals Review   Personal Goals Review Increase Strength and Stamina   Review ShYamiras feeling good about exercise - it is "not nearly as hard" as when she first started.     Expected Outcomes ShBobbijoill continue to see imporvent un her overall fitness and endurance with regular participation.      ITP Comments:  ITP Comments    Row Name 05/08/16 1058           ITP Comments "Know Your Numbers" education was completed today with learning goals met.           Comments: 30 day note review

## 2016-05-25 NOTE — Progress Notes (Signed)
Daily Session Note  Patient Details  Name: Bethany [name] MRN: [id number] Date of Birth: 12-25-1935 Referring Provider:   Flowsheet Row Pulmonary Rehab from 04/28/2016 in [facility] Cardiac and Pulmonary Rehab  Referring Provider  Chasnis      Encounter Date: 05/25/2016  Check In:     Session Check In - 05/25/16 1029      Check-In   Location [facility]-Cardiac & Pulmonary Rehab   Staff Present Earlean Shawl, BS, ACSM CEP, Exercise Physiologist;Amanda Oletta Darter, BA, ACSM CEP, Exercise Physiologist;Mary Kellie Shropshire, RN, BSN, [place], BS, RRT, Respiratory Therapist   Supervising physician immediately available to respond to emergencies [name] immediately available ER MD   Physician(s) Dominic Pea and Paduchowski   Medication changes reported     No   Fall or balance concerns reported    No   Warm-up and Cool-down Performed on first and last piece of equipment   Resistance Training Performed Yes   VAD Patient? No     Pain Assessment   Currently in Pain? No/denies   Multiple Pain Sites No         Goals Met:  Proper associated with RPD/PD & O2 Sat Independence with exercise equipment Exercise tolerated well Strength training completed today  Goals Unmet:  Not Applicable  Comments: Pt able to follow exercise prescription today without complaint.  Will continue to monitor for progression.    Dr. Emily Filbert is Medical Director for [place] and [name].

## 2016-05-26 DIAGNOSIS — M5136 Other intervertebral disc degeneration, lumbar region: Secondary | ICD-10-CM | POA: Diagnosis not present

## 2016-05-26 DIAGNOSIS — M5416 Radiculopathy, lumbar region: Secondary | ICD-10-CM | POA: Diagnosis not present

## 2016-05-28 ENCOUNTER — Encounter: Payer: Self-pay | Admitting: *Deleted

## 2016-05-28 ENCOUNTER — Other Ambulatory Visit: Payer: Self-pay | Admitting: *Deleted

## 2016-05-28 NOTE — Patient Outreach (Signed)
[facility]) Care Management   05/28/2016  Bethany [name] 31-Jul-1935 [id number]  Bethany [name] is an 80 y.o. female  Subjective:  Patient reports that she is doing great, discussed how she is enjoying attending pulmonary rehab, feeling stronger, driving herself to therapy, enjoying the education session, exercise and meeting with other people. Patient discussed her blood pressure reading have been staying good and her weight staying around 150, as readings are checked at each visit.. Patient discussed her plans for shopping errands on today.  Patient denies any recent falls.     Objective:  BP 130/70 (BP Location: Right Arm)   Pulse 88   Resp 20   SpO2 96% Comment: 3 liters [place] Review of Systems  Constitutional: Negative.   HENT: Negative.   Eyes: Negative.   Cardiovascular: Negative.   Genitourinary: Negative.   Musculoskeletal: Positive for back pain.       Chronic back pain  Skin: Negative.   Neurological: Negative.   Endo/Heme/Allergies: Negative.   Psychiatric/Behavioral: Negative.     Physical Exam  Constitutional: She is oriented to person, place, and time. She appears well-developed and well-nourished.  Cardiovascular: Normal rate and normal heart sounds.   GI: Soft.  Neurological: She is alert and oriented to person, place, and time.  Skin: Skin is warm and dry.  Psychiatric: She has a normal mood and affect. Her behavior is normal. Judgment and thought content normal.    Encounter Medications:   Outpatient Encounter Prescriptions as of 05/28/2016  Medication Sig Note  . albuterol (PROVENTIL HFA;VENTOLIN HFA) 108 (90 Base) MCG/ACT inhaler Inhale 2 puffs into the lungs every 6 (six) hours as needed for wheezing or shortness of breath.   Marland Kitchen aspirin (ASPIRIN EC) 81 MG EC tablet Take 81 mg by mouth daily. Swallow whole.   . baclofen (LIORESAL) 10 MG tablet Take 10 mg by mouth 3 (three) times daily.   . cetirizine (ZYRTEC) 10 MG tablet Take 10 mg  by mouth daily.   . diazepam (VALIUM) 5 MG tablet Take 5 mg by mouth at bedtime. Patient takes once at night Reported on 10/16/2015   . docusate sodium (COLACE) 100 MG capsule Take 100 mg by mouth 2 (two) times daily as needed for mild constipation.   Marland Kitchen escitalopram (LEXAPRO) 5 MG tablet Take 10 mg by mouth daily.  03/23/2016: Pt take 2 tabs 19m  . Fluticasone-Salmeterol (ADVAIR) 250-50 MCG/DOSE AEPB Inhale 1 puff into the lungs 2 (two) times daily.   .Marland Kitchengabapentin (NEURONTIN) 300 MG capsule Take 300 mg by mouth 3 (three) times daily.   .Marland KitchenHYDROcodone-acetaminophen (NORCO/VICODIN) 5-325 MG tablet Take 1 tablet by mouth 5 (five) times daily as needed for moderate pain. Do not exceed 5 times daily, patient at least 3 day   . losartan (COZAAR) 50 MG tablet Take 50 mg by mouth daily.   . metoprolol succinate (TOPROL-XL) 25 MG 24 hr tablet Take 25 mg by mouth daily.   . montelukast (SINGULAIR) 10 MG tablet Take 10 mg by mouth at bedtime. Reported on 12/12/2015   . tamsulosin (FLOMAX) 0.4 MG CAPS capsule Take 0.4 mg by mouth daily after supper.   . vitamin B-12 (CYANOCOBALAMIN) 1000 MCG tablet Take 1,000 mcg by mouth daily.   .Marland Kitchenazithromycin (ZITHROMAX Z-PAK) 250 MG tablet Take 2 tablets (500 mg) on  Day 1,  followed by 1 tablet (250 mg) once daily on Days 2 through 5. (Patient not taking: Reported on 05/28/2016)   . lovastatin (  MEVACOR) 20 MG tablet Take 20 mg by mouth at bedtime. Reported on 12/12/2015   . predniSONE (STERAPRED UNI-PAK 21 TAB) 10 MG (21) TBPK tablet Dispense steroid taper pack as directed (Patient not taking: Reported on 05/28/2016)    No facility-administered encounter medications on file as of 05/28/2016.     Functional Status:   In your present state of health, do you have any difficulty performing the following activities: 03/27/2016 01/01/2016  Hearing? N N  Vision? N N  Difficulty concentrating or making decisions? [name]  Walking or climbing stairs? Y Y  Dressing or bathing? N N   Doing errands, shopping? N N  Preparing Food and eating ? N N  Using the Toilet? N N  In the past six months, have you accidently leaked urine? Y Y  Do you have problems with loss of bowel control? N N  Managing your Medications? N N  Managing your Finances? N N  Housekeeping or managing your Housekeeping? [name]  Some recent data might be hidden    Fall/Depression Screening:    PHQ 2/9 Scores 04/28/2016 01/01/2016 12/12/2015 11/12/2015 10/16/2015 09/13/2015  PHQ - 2 Score 3 0 0 0 0 0  PHQ- 9 Score 14 - - - - -   Fall Risk  04/28/2016 01/01/2016 12/12/2015 11/12/2015 10/16/2015  Falls in the past year? Yes Yes Yes Yes Yes  Number falls in past yr: 2 or more 2 or more 2 or more 2 or more 2 or more  Injury with Fall? No Yes Yes Yes Yes  Risk Factor Category  - High Fall Risk High Fall Risk High Fall Risk High Fall Risk  Risk for fall due to : History of fall(s) Impaired balance/gait;History of fall(s) - Impaired balance/gait;History of fall(s) History of fall(s);Impaired balance/gait  Risk for fall due to (comments): - - - - -  Follow up Education provided;Falls prevention discussed Falls evaluation completed;Falls prevention discussed Falls prevention discussed;Education provided Falls evaluation completed;Education provided;Falls prevention discussed Education provided;Falls prevention discussed;Falls evaluation completed   Assessment:    COPD- patient able  to state she is  in the green zone, wearing oxygen at 3 liters,  actively participating in pulmonary rehab, and plans to continue with exercise after rehab program completed with silver sneaker benefit. Patient continues to drink on premier protein drink daily, no recent weight loss.   Fall Risk- No recent  falls, adhering to fall precautions, benefited from rehab program feeling stronger , more energy increased independence.     Patient denies any new concerns, agrees community care management goals have been met.Declined need for telephone  health coach follow up .  She is participating in Pulmonary rehab 3 days a week, increased independence driving herself to program Patient has not experienced hospital admission in the last year, she has experienced 2  ED visits Patient has consistent follow up with PCP, pain clinic  Patient is able to state and recognize COPD zone information and action plan and importance of following.   Plan:  Will close case to care management ,  Will send message to  CMA for case closure, will send letter to MD and patient as well.   Joylene Draft, RN, [place] [facility] [phone]- Mobile [phone]- Toll Free Main Office

## 2016-05-29 ENCOUNTER — Encounter: Payer: Commercial Managed Care - HMO | Admitting: *Deleted

## 2016-05-29 DIAGNOSIS — J449 Chronic obstructive pulmonary disease, unspecified: Secondary | ICD-10-CM | POA: Diagnosis not present

## 2016-05-29 NOTE — Progress Notes (Signed)
Daily Session Note  Patient Details  Name: Bethany [name] MRN: [id number] Date of Birth: 04/07/1936 Referring Provider:   Flowsheet Row Pulmonary Rehab from 04/28/2016 in [facility] Cardiac and Pulmonary Rehab  Referring Provider  Chasnis      Encounter Date: 05/29/2016  Check In:     Session Check In - 05/29/16 1042      Check-In   Location [facility]-Cardiac & Pulmonary Rehab   Staff Present Gerlene Burdock, RN, Vickki Hearing, BA, ACSM CEP, Exercise Physiologist;Patricia Surles RN BSN   Supervising physician immediately available to respond to emergencies [name] immediately available ER MD   Physician(s) Dr. Joni Fears and Dr. Reita Cliche   Medication changes reported     No   Fall or balance concerns reported    No   Warm-up and Cool-down Performed as group-led instruction   Resistance Training Performed Yes   VAD Patient? No     Pain Assessment   Currently in Pain? No/denies           Exercise Prescription Changes - 05/28/16 1100      Response to Exercise   Blood Pressure (Admit) 148/80   Blood Pressure (Exercise) 150/74   Blood Pressure (Exit) 140/64   Heart Rate (Admit) 70 bpm   Heart Rate (Exercise) 85 bpm   Heart Rate (Exit) 69 bpm   Oxygen Saturation (Admit) 96 %   Oxygen Saturation (Exercise) 94 %   Oxygen Saturation (Exit) 99 %   Rating of Perceived Exertion (Exercise) 13   Perceived Dyspnea (Exercise) 3   Symptoms no   Duration Progress to 45 minutes of aerobic exercise without signs/symptoms of physical distress   Intensity THRR unchanged     Progression   Progression Continue to progress workloads to maintain intensity without signs/symptoms of physical distress.   Average METs 2     Resistance Training   Training Prescription Yes   Weight 2   Reps 10-15     Interval Training   Interval Training No     Oxygen   Oxygen Continuous   Liters 3     Treadmill   MPH 1.4   Grade 0   Minutes 15  3/3/3     NuStep   Level 4   Minutes 15   METs 2.1      Biostep-RELP   Level 2   Minutes 15   METs 2      Goals Met:  Proper associated with RPD/PD & O2 Sat Exercise tolerated well  Goals Unmet:  Not Applicable  Comments:     Dr. Emily Filbert is Medical Director for [place] and [name].

## 2016-05-31 DIAGNOSIS — J449 Chronic obstructive pulmonary disease, unspecified: Secondary | ICD-10-CM | POA: Diagnosis not present

## 2016-05-31 DIAGNOSIS — I10 Essential (primary) hypertension: Secondary | ICD-10-CM | POA: Diagnosis not present

## 2016-05-31 DIAGNOSIS — I82409 Acute embolism and thrombosis of unspecified deep veins of unspecified lower extremity: Secondary | ICD-10-CM | POA: Diagnosis not present

## 2016-05-31 DIAGNOSIS — J9620 Acute and chronic respiratory failure, unspecified whether with hypoxia or hypercapnia: Secondary | ICD-10-CM | POA: Diagnosis not present

## 2016-06-03 ENCOUNTER — Encounter: Payer: Commercial Managed Care - HMO | Admitting: *Deleted

## 2016-06-03 DIAGNOSIS — J449 Chronic obstructive pulmonary disease, unspecified: Secondary | ICD-10-CM | POA: Diagnosis not present

## 2016-06-03 LAB — GLUCOSE, CAPILLARY: Glucose-Capillary: 96 mg/dL (ref 65–99)

## 2016-06-03 NOTE — Progress Notes (Signed)
Daily Session Note  Patient Details  Name: Bethany [name] MRN: [id number] Date of Birth: [birth year] Referring Provider:   [facility] Pulmonary Rehab from 04/28/2016 in [facility] Cardiac and Pulmonary Rehab  Referring Provider  Chasnis      Encounter Date: 06/03/2016  Check In:     Session Check In - 06/03/16 1153      Check-In   Location [facility]-Cardiac & Pulmonary Rehab   Staff Present Carson Myrtle, BS, RRT, Respiratory Lennie Hummer, MA, ACSM RCEP, Exercise Physiologist;Ryenne Lynam RN BSN   Supervising physician immediately available to respond to emergencies [name] immediately available ER MD   Physician(s) Drs. Archie Balboa and [employer]   Medication changes reported     No   Fall or balance concerns reported    No   Warm-up and Cool-down Performed as group-led Location manager Performed Yes   VAD Patient? No     Pain Assessment   Currently in Pain? No/denies   Multiple Pain Sites No         Goals Met:  Proper associated with RPD/PD & O2 Sat Independence with exercise equipment Using PLB without cueing & demonstrates good technique Strength training completed today  Goals Unmet:  Not Applicable  Comments: [name] was unable to complete her full exercise prescription today due to complaint of "blurred vision" on her first machine, about 5 mins after exercise began.  VS stable, but Blood Glucose was 96 (patient is not a diabetic, but stated she did not eat breakfast this morning).  [name] was given a pack of cheese crackers and sat out of exercise until symptoms were gone.  She was able to complete her last station without complaint.  Pt was educated regarding importance of eating breakfast prior to exercise.  Will continue to monitor for progression.    Dr. Emily Filbert is Medical Director for [place] and [name].

## 2016-06-05 ENCOUNTER — Encounter: Payer: Commercial Managed Care - HMO | Admitting: *Deleted

## 2016-06-05 DIAGNOSIS — J449 Chronic obstructive pulmonary disease, unspecified: Secondary | ICD-10-CM

## 2016-06-05 NOTE — Progress Notes (Signed)
Daily Session Note  Patient Details  Name: Bethany [name] MRN: [id number] Date of Birth: January 09, 1936 Referring Provider:   Flowsheet Row Pulmonary Rehab from 04/28/2016 in [facility] Cardiac and Pulmonary Rehab  Referring Provider  Chasnis      Encounter Date: 06/05/2016  Check In:     Session Check In - 06/05/16 1116      Check-In   Location [facility]-Cardiac & Pulmonary Rehab   Staff Present Gerlene Burdock, RN, Alex Gardener, DPT, CEEA;Patricia Surles RN BSN   Supervising physician immediately available to respond to emergencies [name] immediately available ER MD   Physician(s) Dr. Corky Downs and Dr. Marcelene Butte   Medication changes reported     No   Fall or balance concerns reported    No   Warm-up and Cool-down Performed as group-led instruction   Resistance Training Performed Yes   VAD Patient? No     Pain Assessment   Currently in Pain? No/denies         Goals Met:  Proper associated with RPD/PD & O2 Sat Exercise tolerated well  Goals Unmet:  Not Applicable  Comments:     Dr. Emily Filbert is Medical Director for [place] and [name].

## 2016-06-10 ENCOUNTER — Encounter: Payer: Commercial Managed Care - HMO | Attending: Physical Medicine and Rehabilitation

## 2016-06-10 DIAGNOSIS — J449 Chronic obstructive pulmonary disease, unspecified: Secondary | ICD-10-CM | POA: Insufficient documentation

## 2016-06-10 NOTE — Progress Notes (Signed)
Daily Session Note  Patient Details  Name: Bethany [name] MRN: [id number] Date of Birth: 1935-07-18 Referring Provider:   Flowsheet Row Pulmonary Rehab from 04/28/2016 in [facility] Cardiac and Pulmonary Rehab  Referring Provider  Chasnis      Encounter Date: 06/10/2016  Check In:     Session Check In - 06/10/16 1107      Check-In   Location [facility]-Cardiac & Pulmonary Rehab   Staff Present Carson Myrtle, BS, RRT, Respiratory Lennie Hummer, MA, ACSM RCEP, Exercise Physiologist;Esiah Bazinet Oletta Darter, BA, ACSM CEP, Exercise Physiologist   Supervising physician immediately available to respond to emergencies [name] immediately available ER MD   Physician(s) Clearnce Hasten and Joni Fears   Medication changes reported     No   Fall or balance concerns reported    No   Warm-up and Cool-down Performed as group-led Location manager Performed Yes   VAD Patient? No     Pain Assessment   Currently in Pain? No/denies         Goals Met:  Proper associated with RPD/PD & O2 Sat Independence with exercise equipment Exercise tolerated well Strength training completed today  Goals Unmet:  Not Applicable  Comments: Pt able to follow exercise prescription today without complaint.  Will continue to monitor for progression.    Dr. Emily Filbert is Medical Director for [name] and [name].

## 2016-06-12 ENCOUNTER — Encounter: Payer: Commercial Managed Care - HMO | Admitting: *Deleted

## 2016-06-12 DIAGNOSIS — J449 Chronic obstructive pulmonary disease, unspecified: Secondary | ICD-10-CM

## 2016-06-12 NOTE — Progress Notes (Signed)
Daily Session Note  Patient Details  Name: Bethany [name] MRN: [id number] Date of Birth: [birth year] Referring Provider:   April [facility] Pulmonary Rehab from 04/28/2016 in [facility] Cardiac and Pulmonary Rehab  Referring Provider  Chasnis      Encounter Date: 06/12/2016  Check In:     Session Check In - 06/12/16 1027      Check-In   Location [facility]-Cardiac & Pulmonary Rehab   Staff Present Gerlene Burdock, RN, Vickki Hearing, BA, ACSM CEP, Exercise Physiologist;Drake Wuertz RN BSN   Supervising physician immediately available to respond to emergencies [name] immediately available ER MD   Physician(s) Drs. Paduchowski & Williams   Medication changes reported     No   Fall or balance concerns reported    No   Warm-up and Cool-down Performed as group-led Location manager Performed Yes   VAD Patient? No     Pain Assessment   Currently in Pain? No/denies   Multiple Pain Sites No           Exercise Prescription Changes - 06/11/16 1100      Response to Exercise   Blood Pressure (Admit) 128/70   Blood Pressure (Exercise) 164/84   Blood Pressure (Exit) 110/64   Heart Rate (Admit) 71 bpm   Heart Rate (Exercise) 87 bpm   Heart Rate (Exit) 68 bpm   Oxygen Saturation (Admit) 98 %   Oxygen Saturation (Exercise) 95 %   Oxygen Saturation (Exit) 99 %   Rating of Perceived Exertion (Exercise) 13   Perceived Dyspnea (Exercise) 5   Symptoms no   Duration Progress to 45 minutes of aerobic exercise without signs/symptoms of physical distress   Intensity THRR unchanged     Progression   Progression Continue to progress workloads to maintain intensity without signs/symptoms of physical distress.   Average METs 2     Resistance Training   Training Prescription Yes   Weight 2   Reps 10-15     Interval Training   Interval Training No     Oxygen   Oxygen Continuous   Liters 3     Treadmill   MPH 1.4   Grade 0   Minutes 15  4/4/3   METs 2     NuStep   Level 4   Minutes 15   METs 2      Goals Met:  Proper associated with RPD/PD & O2 Sat Independence with exercise equipment Using PLB without cueing & demonstrates good technique Exercise tolerated well No report of cardiac concerns or symptoms Strength training completed today  Goals Unmet:  Not Applicable  Comments: Pt able to follow exercise prescription today without complaint.  Will continue to monitor for progression.    Dr. Emily Filbert is Medical Director for [place] and [name].

## 2016-06-17 DIAGNOSIS — J449 Chronic obstructive pulmonary disease, unspecified: Secondary | ICD-10-CM | POA: Diagnosis not present

## 2016-06-17 NOTE — Progress Notes (Signed)
Bethany [name] psychosocial assessment reveals no barriers at this time to participation in Pulmonary Rehab.  She has good family and friend support that encourages Bethany [name] to participate in [name] and progress with Her goals.  Bethany [name] concerns are monitored, but she has acknowledge that attending the program has helped to maintain quality life with improved mobility, self-care, and emotional and financial stability.  Bethany [name] is commended for regular attendance and self-motivation to improve Her pulmonary disease management. She does have good support from her brother who is now helping find a Designer, jewelleryportable concentrator for her.

## 2016-06-17 NOTE — Progress Notes (Signed)
Daily Session Note  Patient Details  Name: Bethany [name] MRN: [id number] Date of Birth: November 28, 1935 Referring Provider:   Flowsheet Row Pulmonary Rehab from 04/28/2016 in [facility] Cardiac and Pulmonary Rehab  Referring Provider  Chasnis      Encounter Date: 06/17/2016  Check In:      Goals Met:  Proper associated with RPD/PD & O2 Sat Independence with exercise equipment Exercise tolerated well Strength training completed today  Goals Unmet:  Not Applicable  Comments: Pt able to follow exercise prescription today without complaint.  Will continue to monitor for progression.    Dr. Emily Filbert is Medical Director for [place] and [name].

## 2016-06-19 ENCOUNTER — Encounter: Payer: Commercial Managed Care - HMO | Admitting: *Deleted

## 2016-06-19 DIAGNOSIS — J449 Chronic obstructive pulmonary disease, unspecified: Secondary | ICD-10-CM | POA: Diagnosis not present

## 2016-06-19 NOTE — Progress Notes (Signed)
Daily Session Note  Patient Details  Name: Bethany [name] MRN: [id number] Date of Birth: Sep 27, 1935 Referring Provider:   Flowsheet Row Pulmonary Rehab from 04/28/2016 in [facility] Cardiac and Pulmonary Rehab  Referring Provider  Chasnis      Encounter Date: 06/19/2016  Check In:     Session Check In - 06/19/16 1111      Check-In   Location [facility]-Cardiac & Pulmonary Rehab   Staff Present Gerlene Burdock, RN, Vickki Hearing, BA, ACSM CEP, Exercise Physiologist;Patricia Surles RN BSN   Supervising physician immediately available to respond to emergencies [name] immediately available ER MD   Physician(s) Dr. Katy Fitch and Dr. Jimmye Norman   Medication changes reported     No   Fall or balance concerns reported    No   Warm-up and Cool-down Performed on first and last piece of equipment   Resistance Training Performed Yes   VAD Patient? No     Pain Assessment   Currently in Pain? No/denies         Goals Met:  Proper associated with RPD/PD & O2 Sat Exercise tolerated well  Goals Unmet:  Not Applicable  Comments:  Did 6 minute walk today.   Dr. Emily Filbert is Medical Director for [place] and [name].

## 2016-06-22 ENCOUNTER — Encounter: Payer: Self-pay | Admitting: Respiratory Therapy

## 2016-06-22 DIAGNOSIS — J449 Chronic obstructive pulmonary disease, unspecified: Secondary | ICD-10-CM

## 2016-06-22 NOTE — Progress Notes (Signed)
Pulmonary Individual Treatment Plan  Patient Details  Name: Bethany [name] MRN: [id number] Date of Birth: 14-Aug-1935 Referring Provider:   Flowsheet Row Pulmonary Rehab from 04/28/2016 in [facility] Cardiac and Pulmonary Rehab  Referring Provider  Chasnis      Initial Encounter Date:  Flowsheet Row Pulmonary Rehab from 04/28/2016 in [facility] Cardiac and Pulmonary Rehab  Date  04/28/16  Referring Provider  Chasnis      Visit Diagnosis: Chronic obstructive pulmonary disease, unspecified COPD type ([place])  Patient's Home Medications on Admission:  Current Outpatient Prescriptions:    albuterol (PROVENTIL HFA;VENTOLIN HFA) 108 (90 Base) MCG/ACT inhaler, Inhale 2 puffs into the lungs every 6 (six) hours as needed for wheezing or shortness of breath., Disp: 1 Inhaler, Rfl: 2   aspirin (ASPIRIN EC) 81 MG EC tablet, Take 81 mg by mouth daily. Swallow whole., Disp: , Rfl:    azithromycin (ZITHROMAX Z-PAK) 250 MG tablet, Take 2 tablets (500 mg) on  Day 1,  followed by 1 tablet (250 mg) once daily on Days 2 through 5. (Patient not taking: Reported on 05/28/2016), Disp: 6 each, Rfl: 0   baclofen (LIORESAL) 10 MG tablet, Take 10 mg by mouth 3 (three) times daily., Disp: , Rfl:    cetirizine (ZYRTEC) 10 MG tablet, Take 10 mg by mouth daily., Disp: , Rfl:    diazepam (VALIUM) 5 MG tablet, Take 5 mg by mouth at bedtime. Patient takes once at night Reported on 10/16/2015, Disp: , Rfl:    docusate sodium (COLACE) 100 MG capsule, Take 100 mg by mouth 2 (two) times daily as needed for mild constipation., Disp: , Rfl:    escitalopram (LEXAPRO) 5 MG tablet, Take 10 mg by mouth daily. , Disp: , Rfl:    Fluticasone-Salmeterol (ADVAIR) 250-50 MCG/DOSE AEPB, Inhale 1 puff into the lungs 2 (two) times daily., Disp: , Rfl:    gabapentin (NEURONTIN) 300 MG capsule, Take 300 mg by mouth 3 (three) times daily., Disp: , Rfl:    HYDROcodone-acetaminophen (NORCO/VICODIN) 5-325 MG tablet, Take 1 tablet by mouth 5 (five)  times daily as needed for moderate pain. Do not exceed 5 times daily, patient at least 3 day, Disp: , Rfl:    losartan (COZAAR) 50 MG tablet, Take 50 mg by mouth daily., Disp: , Rfl:    lovastatin (MEVACOR) 20 MG tablet, Take 20 mg by mouth at bedtime. Reported on 12/12/2015, Disp: , Rfl:    metoprolol succinate (TOPROL-XL) 25 MG 24 hr tablet, Take 25 mg by mouth daily., Disp: , Rfl:    montelukast (SINGULAIR) 10 MG tablet, Take 10 mg by mouth at bedtime. Reported on 12/12/2015, Disp: , Rfl:    predniSONE (STERAPRED UNI-PAK 21 TAB) 10 MG (21) TBPK tablet, Dispense steroid taper pack as directed (Patient not taking: Reported on 05/28/2016), Disp: 21 tablet, Rfl: 0   tamsulosin (FLOMAX) 0.4 MG CAPS capsule, Take 0.4 mg by mouth daily after supper., Disp: , Rfl:    vitamin B-12 (CYANOCOBALAMIN) 1000 MCG tablet, Take 1,000 mcg by mouth daily., Disp: , Rfl:   Past Medical History: Past Medical History:  Diagnosis Date   Allergy    Anxiety    Arthritis    COPD (chronic obstructive pulmonary disease) ([employer])    Hyperlipidemia    Hypertension    Oxygen deficiency     Tobacco Use: History  Smoking Status   Former Smoker   Quit date: 10/06/2014  Smokeless Tobacco   Never Used    Labs: Recent Review Scientist, physiological  There is no flowsheet data to display.       ADL UCSD:     Pulmonary Assessment Scores    Row Name 04/28/16 1154         ADL UCSD   ADL Phase Entry     SOB Score total 82     Rest 1     Walk 3     Stairs 5     Bath 5     Dress 3     Shop 2        Pulmonary Function Assessment:     Pulmonary Function Assessment - 04/28/16 1152      Pulmonary Function Tests   FVC% 41 %   FEV1% 39 %   FEV1/FVC Ratio 70.49     Initial Spirometry Results   Comments Test date 04/28/16     Breath   Bilateral Breath Sounds Clear   Shortness of Breath Yes;Limiting activity;Fear of Shortness of Breath      Exercise Target Goals:    Exercise Program  Goal: Individual exercise prescription set with THRR, safety & activity barriers. Participant demonstrates ability to understand and report RPE using BORG scale, to self-measure pulse accurately, and to acknowledge the importance of the exercise prescription.  Exercise Prescription Goal: Starting with aerobic activity 30 plus minutes a day, 3 days per week for initial exercise prescription. Provide home exercise prescription and guidelines that participant acknowledges understanding prior to discharge.  Activity Barriers & Risk Stratification:     Activity Barriers & Cardiac Risk Stratification - 04/28/16 1151      Activity Barriers & Cardiac Risk Stratification   Activity Barriers Back Problems;Shortness of Breath;Deconditioning   Cardiac Risk Stratification Moderate      6 Minute Walk:     6 Minute Walk    Row Name 04/28/16 1228 06/19/16 1204       6 Minute Walk   Phase  -- Mid Program    Distance 675 feet 900 feet    Distance % Change  -- 33 %    Walk Time 5.4 minutes 6 minutes    # of Rest Breaks 3 0    MPH 1.42 1.7    METS 2.07 2.55    RPE  -- 11    Perceived Dyspnea   -- 3    VO2 Peak 5.97 8.9    Symptoms No No    Resting HR 73 bpm 86 bpm    Resting BP 128/70 148/72    Max Ex. HR 112 bpm 127 bpm    Max Ex. BP 148/84 178/76      Interval HR   Baseline HR 73 86    1 Minute HR 106 108    2 Minute HR 103 117    3 Minute HR 109 124    4 Minute HR 105 127    5 Minute HR 112 127    6 Minute HR 112 110    2 Minute Post HR 96  --    Interval Heart Rate? Yes Yes      Interval Oxygen   Interval Oxygen?  -- Yes    Baseline Oxygen Saturation %  -- 96 %    Baseline Liters of Oxygen  -- 3 L    1 Minute Oxygen Saturation %  -- 92 %    1 Minute Liters of Oxygen  -- 3 L    2 Minute Oxygen Saturation %  -- 90 %    2 Minute  Liters of Oxygen  -- 3 L    3 Minute Oxygen Saturation %  -- 90 %    3 Minute Liters of Oxygen  -- 3 L    4 Minute Oxygen Saturation %  -- 90 %     4 Minute Liters of Oxygen  -- 3 L    5 Minute Oxygen Saturation %  -- 88 %    5 Minute Liters of Oxygen  -- 3 L    6 Minute Oxygen Saturation %  -- 90 %    6 Minute Liters of Oxygen  -- 3 L    2 Minute Post Liters of Oxygen  -- 3 L       Initial Exercise Prescription:     Initial Exercise Prescription - 04/28/16 1200      Date of Initial Exercise RX and Referring Provider   Date 04/28/16   Referring Provider Chasnis     Oxygen   Oxygen Continuous   Liters 3     Treadmill   MPH 1.4   Grade 0   Minutes 15  3/3/3/3   METs 2     NuStep   Level 2   Minutes 15   METs 2     Biostep-RELP   Level 2   Minutes 15   METs 2     Prescription Details   Frequency (times per week) 3   Duration Progress to 45 minutes of aerobic exercise without signs/symptoms of physical distress     Intensity   THRR 40-80% of Max Heartrate 100-128   Ratings of Perceived Exertion 11-13   Perceived Dyspnea 0-4     Progression   Progression Continue to progress workloads to maintain intensity without signs/symptoms of physical distress.     Resistance Training   Training Prescription Yes   Weight 2   Reps 10-12      Perform Capillary Blood Glucose checks as needed.  Exercise Prescription Changes:     Exercise Prescription Changes    Row Name 05/04/16 1100 05/13/16 1200 05/20/16 1200 05/28/16 1100 06/11/16 1100     Exercise Review   Progression  -- No  --  --  --     Response to Exercise   Blood Pressure (Admit) 111/58 114/60  -- 148/80 128/70   Blood Pressure (Exercise) 142/70 126/64  -- 150/74 164/84   Blood Pressure (Exit) 132/72 124/70  -- 140/64 110/64   Heart Rate (Admit) 79 bpm 64 bpm  -- 70 bpm 71 bpm   Heart Rate (Exercise) 89 bpm 108 bpm  -- 85 bpm 87 bpm   Heart Rate (Exit) 69 bpm 60 bpm  -- 69 bpm 68 bpm   Oxygen Saturation (Admit) 99 % 96 %  -- 96 % 98 %   Oxygen Saturation (Exercise) 94 % 96 %  -- 94 % 95 %   Oxygen Saturation (Exit) 99 % 97 %  -- 99 % 99 %    Rating of Perceived Exertion (Exercise) 14 13  -- 13 13   Perceived Dyspnea (Exercise) 3 4  -- 3 5   Symptoms  -- yes  -- no no   Comments  -- back pain  Pain is next day after exercise - staff will monitor  --  --  --   Duration  -- Progress to 45 minutes of aerobic exercise without signs/symptoms of physical distress  -- Progress to 45 minutes of aerobic exercise without signs/symptoms of physical distress Progress to 45 minutes  of aerobic exercise without signs/symptoms of physical distress   Intensity  -- THRR unchanged  -- THRR unchanged THRR unchanged     Progression   Progression  -- Continue to progress workloads to maintain intensity without signs/symptoms of physical distress.  -- Continue to progress workloads to maintain intensity without signs/symptoms of physical distress. Continue to progress workloads to maintain intensity without signs/symptoms of physical distress.   Average METs  -- 2  -- 2 2     Resistance Training   Training Prescription Yes Yes  -- Yes Yes   Weight 2 2  -- 2 2   Reps 10-12 10-12  -- 10-15 10-15     Interval Training   Interval Training  -- No  -- No No     Oxygen   Oxygen Continuous Continuous  -- Continuous Continuous   Liters 3 3  -- 3 3     Treadmill   MPH 1.4 1.4  -- 1.4 1.4   Grade 0 0  -- 0 0   Minutes 15  3/3/3/3 15  3/3  -- 15  3/3/3 15  4/4/3   METs 2 2  --  -- 2     NuStep   Level 2 3  -- 4 4   Minutes 15 15  -- 15 15   METs 2 2.1  -- 2.1 2     Biostep-RELP   Level 2 2  -- 2  --   Minutes 15 15  -- 15  --   METs 2 2  -- 2  --     Home Exercise Plan   Plans to continue exercise at  --  -- Home  --  --   Frequency  --  -- Add 2 additional days to program exercise sessions.  --  --      Exercise Comments:     Exercise Comments    Row Name 05/04/16 1109 05/13/16 1248 05/20/16 1212 05/28/16 1139 06/03/16 1158   Exercise Comments First full day of exercise!  Patient was oriented to gym and equipment including functions,  settings, policies, and procedures.  Patient's individual exercise prescription and treatment plan were reviewed.  All starting workloads were established based on the results of the 6 minute walk test done at initial orientation visit.  The plan for exercise progression was also introduced and progression will be customized based on patient's performance and goals Bethany [name] has had some back pain the day after exercise.  Bethany [name] was discussed and staff will continue to check in with her.  Stretches and general strength should be helpful. Reviewed home exercise with pt today.  Pt plans to walk with friends for exercise.  Reviewed THR, pulse, RPE, sign and symptoms, NTG use, and when to call 911 or MD.  Also discussed weather considerations and indoor options.  Pt voiced understanding. Bethany [name] reports her back is less painful than the first week.  She has added resistance on the NS.   Bethany [name] was unable to complete her full exercise prescription today due to complaint of "blurred vision" on her first machine, about 5 mins after exercise began.  VS stable, but Blood Glucose was 96 (patient is not a diabetic, but stated she did not eat breakfast this morning).  Bethany [name] was given a pack of cheese crackers and sat out of exercise until symptoms were gone.  She was able to complete her last station without complaint.  Pt was educated regarding importance of  eating breakfast prior to exercise.  Will continue to monitor for progression.   Bethany [name] Name 06/11/16 1159           Exercise Comments Bethany [name] is progressing well - interval training will be added to her program and she will continue to add time on the TM.          Discharge Exercise Prescription (Final Exercise Prescription Changes):     Exercise Prescription Changes - 06/11/16 1100      Response to Exercise   Blood Pressure (Admit) 128/70   Blood Pressure (Exercise) 164/84   Blood Pressure (Exit) 110/64   Heart Rate (Admit) 71 bpm    Heart Rate (Exercise) 87 bpm   Heart Rate (Exit) 68 bpm   Oxygen Saturation (Admit) 98 %   Oxygen Saturation (Exercise) 95 %   Oxygen Saturation (Exit) 99 %   Rating of Perceived Exertion (Exercise) 13   Perceived Dyspnea (Exercise) 5   Symptoms no   Duration Progress to 45 minutes of aerobic exercise without signs/symptoms of physical distress   Intensity THRR unchanged     Progression   Progression Continue to progress workloads to maintain intensity without signs/symptoms of physical distress.   Average METs 2     Resistance Training   Training Prescription Yes   Weight 2   Reps 10-15     Interval Training   Interval Training No     Oxygen   Oxygen Continuous   Liters 3     Treadmill   MPH 1.4   Grade 0   Minutes 15  4/4/3   METs 2     NuStep   Level 4   Minutes 15   METs 2       Nutrition:  Target Goals: Understanding of nutrition guidelines, daily intake of sodium <1527m, cholesterol <2037m calories 30% from fat and 7% or less from saturated fats, daily to have 5 or more servings of fruits and vegetables.  Biometrics:     Pre Biometrics - 04/28/16 1227      Pre Biometrics   Height 5' 5"  (1.651 m)   Weight 152 lb 12.8 oz (69.3 kg)   Waist Circumference 36.5 inches   Hip Circumference 40.75 inches   Waist to Hip Ratio 0.9 %   BMI (Calculated) 25.5       Nutrition Therapy Plan and Nutrition Goals:   Nutrition Discharge: Rate Your Plate Scores:   Psychosocial: Target Goals: Acknowledge presence or absence of depression, maximize coping skills, provide positive support system. Participant is able to verbalize types and ability to use techniques and skills needed for reducing stress and depression.  Initial Review & Psychosocial Screening:     Initial Psych Review & Screening - 04/28/16 1157      Family Dynamics   Comments Bethany [name] dealing with the lose of her best friend who passed away last week. Her friend had been ill for some time,  so she is comforted that she is not suffering anymore. Bethany [name] [name] will benfit her to be more active and help her to move on from her friend's lose.her activity and help h      Quality of Life Scores:     Quality of Life - 04/28/16 1203      Quality of Life Scores   Health/Function Pre 21 %   Socioeconomic Pre 20.29 %   Psych/Spiritual Pre 21 %   Family Pre 21 %   GLOBAL Pre 20.84 %  PHQ-9: Recent Review Flowsheet Data    Depression screen [facility] 2/9 04/28/2016 01/01/2016 12/12/2015 11/12/2015 10/16/2015   Decreased Interest 2 0 0 0 0   Down, Depressed, Hopeless 1 0 0 0 0   PHQ - 2 Score 3 0 0 0 0   Altered sleeping 2 - - - -   Tired, decreased energy 3 - - - -   Change in appetite 3 - - - -   Feeling bad or failure about yourself  1 - - - -   Trouble concentrating 2 - - - -   Moving slowly or fidgety/restless 0 - - - -   Suicidal thoughts 0 - - - -   PHQ-9 Score 14 - - - -   Difficult doing work/chores Somewhat difficult - - - -      Psychosocial Evaluation and Intervention:     Psychosocial Evaluation - 05/04/16 1037      Psychosocial Evaluation & Interventions   Interventions Encouraged to exercise with the program and follow exercise prescription   Comments Counselor met with Bethany. Hritz today for initial psychosocial evaluation.  She is a 81 year old female who was diagnosed with COPD in 2015.  Her support system is limited at this time with a best friend who recently passed away and a brother who lived with Bethany [name] recently moved out.  She has close neighbors and realizes she needs to get back involved with her local church Sunday School class.  She has had multiple back surgeries and has fallen (4) times in the past several years; the last time was one month ago.  She reports both her sleep and her appetite have been poor but are improving somewhat recently.  She denies a history of depression or anxiety; however, Counselor discussed her PHQ-9 Scores of "14"  indicating moderate depression with her.  She admitted since her friend passed this has been very difficult for her.  She states her stressors include just not being able to do those things she used to enjoy.  She has goals to improve her breathing and flexibility and to "quit falling."  Counselor encouraged her to increase her social supports to improve her mood and her feeling of isolation with the recent losses.  She agreed to do so and counselor will be following with her to see if seeing a therapist or a PCP about possible medications for her mood might be warranted in the near future.     Continued Psychosocial Services Needed Yes  Follow on mood - possibly refer for counseling or PCP for med eval.  Recommended to increase social supports.      Psychosocial Re-Evaluation:     Psychosocial Re-Evaluation    [place] Name 06/17/16 1618             Psychosocial Re-Evaluation   Comments Bethany [name] psychosocial assessment reveals no barriers at this time to participation in Pulmonary Rehab.  She has good family and friend support that encourages Bethany [name] to participate in [place] and progress with Her goals.  [place] concerns are monitored, but she has acknowledge that attending the program has helped to maintain quality life with improved mobility, self-care, and emotional and financial stability.  [place] is commended for regular attendance and self-motivation to improve Her pulmonary disease management. She does have good support from her brother who is now helping find a Marine scientist for her.         Education: Education Goals: Education classes will be provided on  a weekly basis, covering required topics. Participant will state understanding/return demonstration of topics presented.  Learning Barriers/Preferences:     Learning Barriers/Preferences - 04/28/16 1152      Learning Barriers/Preferences   Learning Barriers None   Learning Preferences None      Education  Topics: Initial Evaluation Education: - Verbal, written and demonstration of respiratory meds, RPE/PD scales, oximetry and breathing techniques. Instruction on use of nebulizers and MDIs: cleaning and proper use, rinsing mouth with steroid doses and importance of monitoring MDI activations. Flowsheet Row Pulmonary Rehab from 06/17/2016 in [facility] Cardiac and Pulmonary Rehab  Date  04/28/16  Educator  LB  Instruction Review Code  2- meets goals/outcomes      General Nutrition Guidelines/Fats and Fiber: -Group instruction provided by verbal, written material, models and posters to present the general guidelines for heart healthy nutrition. Gives an explanation and review of dietary fats and fiber.   Controlling Sodium/Reading Food Labels: -Group verbal and written material supporting the discussion of sodium use in heart healthy nutrition. Review and explanation with models, verbal and written materials for utilization of the food label. Flowsheet Row Pulmonary Rehab from 06/17/2016 in [facility] Cardiac and Pulmonary Rehab  Date  05/04/16  Educator  CR  Instruction Review Code  2- meets goals/outcomes      Exercise Physiology & Risk Factors: - Group verbal and written instruction with models to review the exercise physiology of the cardiovascular system and associated critical values. Details cardiovascular disease risk factors and the goals associated with each risk factor. Flowsheet Row Pulmonary Rehab from 06/17/2016 in [facility] Cardiac and Pulmonary Rehab  Date  06/10/16  Educator  [facility]  Instruction Review Code  2- meets goals/outcomes      Aerobic Exercise & Resistance Training: - Gives group verbal and written discussion on the health impact of inactivity. On the components of aerobic and resistive training programs and the benefits of this training and how to safely progress through these programs.   Flexibility, Balance, General Exercise Guidelines: - Provides group verbal and written  instruction on the benefits of flexibility and balance training programs. Provides general exercise guidelines with specific guidelines to those with heart or lung disease. Demonstration and skill practice provided.   Stress Management: - Provides group verbal and written instruction about the health risks of elevated stress, cause of high stress, and healthy ways to reduce stress.   Depression: - Provides group verbal and written instruction on the correlation between heart/lung disease and depressed mood, treatment options, and the stigmas associated with seeking treatment. Flowsheet Row Pulmonary Rehab from 06/17/2016 in [facility] Cardiac and Pulmonary Rehab  Date  05/11/16  Educator  [facility]  Instruction Review Code  2- meets goals/outcomes      Exercise & Equipment Safety: - Individual verbal instruction and demonstration of equipment use and safety with use of the equipment. Flowsheet Row Pulmonary Rehab from 06/17/2016 in [facility] Cardiac and Pulmonary Rehab  Date  05/04/16  Educator  [facility]  Instruction Review Code  2- meets goals/outcomes      Infection Prevention: - Provides verbal and written material to individual with discussion of infection control including proper hand washing and proper equipment cleaning during exercise session. Flowsheet Row Pulmonary Rehab from 06/17/2016 in [facility] Cardiac and Pulmonary Rehab  Date  05/04/16  Educator  [facility]  Instruction Review Code  2- meets goals/outcomes      Falls Prevention: - Provides verbal and written material to individual with discussion of falls prevention and safety.  Flowsheet Row Pulmonary Rehab from 06/17/2016 in [facility] Cardiac and Pulmonary Rehab  Date  04/28/16  Educator  LB  Instruction Review Code  2- meets goals/outcomes      Diabetes: - Individual verbal and written instruction to review signs/symptoms of diabetes, desired ranges of glucose level fasting, after meals and with exercise. Advice that pre and post exercise glucose  checks will be done for 3 sessions at entry of program.   Chronic Lung Diseases: - Group verbal and written instruction to review new updates, new respiratory medications, new advancements in procedures and treatments. Provide informative websites and "800" numbers of self-education.   Lung Procedures: - Group verbal and written instruction to describe testing methods done to diagnose lung disease. Review the outcome of test results. Describe the treatment choices: Pulmonary Function Tests, ABGs and oximetry.   Energy Conservation: - Provide group verbal and written instruction for methods to conserve energy, plan and organize activities. Instruct on pacing techniques, use of adaptive equipment and posture/positioning to relieve shortness of breath.   Triggers: - Group verbal and written instruction to review types of environmental controls: home humidity, furnaces, filters, dust mite/pet prevention, HEPA vacuums. To discuss weather changes, air quality and the benefits of nasal washing. Flowsheet Row Pulmonary Rehab from 06/17/2016 in [facility] Cardiac and Pulmonary Rehab  Date  06/03/16  Educator  LB  Instruction Review Code  2- meets goals/outcomes      Exacerbations: - Group verbal and written instruction to provide: warning signs, infection symptoms, calling MD promptly, preventive modes, and value of vaccinations. Review: effective airway clearance, coughing and/or vibration techniques. Create an Sports administrator. Flowsheet Row Pulmonary Rehab from 06/17/2016 in [facility] Cardiac and Pulmonary Rehab  Date  05/13/16  Educator  LB  Instruction Review Code  2- meets goals/outcomes      Oxygen: - Individual and group verbal and written instruction on oxygen therapy. Includes supplement oxygen, available portable oxygen systems, continuous and intermittent flow rates, oxygen safety, concentrators, and Medicare reimbursement for oxygen. Flowsheet Row Pulmonary Rehab from 06/17/2016 in [facility] Cardiac  and Pulmonary Rehab  Date  04/28/16  Educator  LB  Instruction Review Code  2- meets goals/outcomes      Respiratory Medications: - Group verbal and written instruction to review medications for lung disease. Drug class, frequency, complications, importance of spacers, rinsing mouth after steroid MDI's, and proper cleaning methods for nebulizers. Flowsheet Row Pulmonary Rehab from 06/17/2016 in [facility] Cardiac and Pulmonary Rehab  Date  04/28/16  Educator  LB  Instruction Review Code  2- meets goals/outcomes      AED/CPR: - Group verbal and written instruction with the use of models to demonstrate the basic use of the AED with the basic ABC's of resuscitation.   Breathing Retraining: - Provides individuals verbal and written instruction on purpose, frequency, and proper technique of diaphragmatic breathing and pursed-lipped breathing. Applies individual practice skills. Flowsheet Row Pulmonary Rehab from 06/17/2016 in [facility] Cardiac and Pulmonary Rehab  Date  05/04/16  Educator  [facility]  Instruction Review Code  2- meets goals/outcomes      Anatomy and Physiology of the Lungs: - Group verbal and written instruction with the use of models to provide basic lung anatomy and physiology related to function, structure and complications of lung disease. Flowsheet Row Pulmonary Rehab from 06/17/2016 in [facility] Cardiac and Pulmonary Rehab  Date  05/06/16  Educator  [facility]  Instruction Review Code  2- meets goals/outcomes      Heart Failure: - Group  verbal and written instruction on the basics of heart failure: signs/symptoms, treatments, explanation of ejection fraction, enlarged heart and cardiomyopathy. Flowsheet Row Pulmonary Rehab from 06/17/2016 in [facility] Cardiac and Pulmonary Rehab  Date  05/22/16  Educator  [facility]  Instruction Review Code  2- meets goals/outcomes      Sleep Apnea: - Individual verbal and written instruction to review Obstructive Sleep Apnea. Review of risk factors, methods for  diagnosing and types of masks and machines for OSA.   Anxiety: - Provides group, verbal and written instruction on the correlation between heart/lung disease and anxiety, treatment options, and management of anxiety. Flowsheet Row Pulmonary Rehab from 06/17/2016 in [facility] Cardiac and Pulmonary Rehab  Date  06/17/16  Educator  [facility]  Instruction Review Code  2- Meets goals/outcomes      Relaxation: - Provides group, verbal and written instruction about the benefits of relaxation for patients with heart/lung disease. Also provides patients with examples of relaxation techniques.   Knowledge Questionnaire Score:     Knowledge Questionnaire Score - 04/28/16 1152      Knowledge Questionnaire Score   Pre Score 10/10       Core Components/Risk Factors/Patient Goals at Admission:     Personal Goals and Risk Factors at Admission - 04/28/16 1155      Core Components/Risk Factors/Patient Goals on Admission   Sedentary Yes   Intervention Provide advice, education, support and counseling about physical activity/exercise needs.;Develop an individualized exercise prescription for aerobic and resistive training based on initial evaluation findings, risk stratification, comorbidities and participant's personal goals.   Expected Outcomes Achievement of increased cardiorespiratory fitness and enhanced flexibility, muscular endurance and strength shown through measurements of functional capacity and personal statement of participant.   Increase Strength and Stamina Yes   Intervention Provide advice, education, support and counseling about physical activity/exercise needs.;Develop an individualized exercise prescription for aerobic and resistive training based on initial evaluation findings, risk stratification, comorbidities and participant's personal goals.   Expected Outcomes Achievement of increased cardiorespiratory fitness and enhanced flexibility, muscular endurance and strength shown through  measurements of functional capacity and personal statement of participant.   Improve shortness of breath with ADL's Yes   Intervention Provide education, individualized exercise plan and daily activity instruction to help decrease symptoms of SOB with activities of daily living.   Expected Outcomes Short Term: Achieves a reduction of symptoms when performing activities of daily living.   Develop more efficient breathing techniques such as purse lipped breathing and diaphragmatic breathing; and practicing self-pacing with activity Yes   Intervention Provide education, demonstration and support about specific breathing techniuqes utilized for more efficient breathing. Include techniques such as pursed lipped breathing, diaphragmatic breathing and self-pacing activity.   Expected Outcomes Short Term: Participant will be able to demonstrate and use breathing techniques as needed throughout daily activities.   Increase knowledge of respiratory medications and ability to use respiratory devices properly  Yes  3l/m oxygen, Advair, Proventil MDI, Spacer given   Intervention Provide education and demonstration as needed of appropriate use of medications, inhalers, and oxygen therapy.   Expected Outcomes Short Term: Achieves understanding of medications use. Understands that oxygen is a medication prescribed by physician. Demonstrates appropriate use of inhaler and oxygen therapy.   Hypertension Yes   Intervention Provide education on lifestyle modifcations including regular physical activity/exercise, weight management, moderate sodium restriction and increased consumption of fresh fruit, vegetables, and low fat dairy, alcohol moderation, and smoking cessation.;Monitor prescription use compliance.   Expected Outcomes Short Term:  Continued assessment and intervention until BP is < 140/55m HG in hypertensive participants. < 130/839mHG in hypertensive participants with diabetes, heart failure or chronic kidney  disease.;Long Term: Maintenance of blood pressure at goal levels.      Core Components/Risk Factors/Patient Goals Review:      Goals and Risk Factor Review    Row Name 05/04/16 1109 05/18/16 1341 06/10/16 1356 06/12/16 1222       Core Components/Risk Factors/Patient Goals Review   Personal Goals Review Develop more efficient breathing techniques such as purse lipped breathing and diaphragmatic breathing and practicing self-pacing with activity. Increase Strength and Stamina Sedentary;Increase Strength and Stamina;Improve shortness of breath with ADL's;Increase knowledge of respiratory medications and ability to use respiratory devices properly.;Develop more efficient breathing techniques such as purse lipped breathing and diaphragmatic breathing and practicing self-pacing with activity.;Hypertension Increase Strength and Stamina;Improve shortness of breath with ADL's    Review PLB technique was reviewed with the patinet. She demonstrated understanding of this technique.  Bethany [name] feeling good about exercise - it is "not nearly as hard" as when she first started.   Bethany [name] progressing with her exercise goals and education. She attends regularly and is very interested in the education. She states she has more energy , and although Bethany [name] has increased shortness of breath, she is learning to pace herself and use PLB. For example, she vacuums one room at a time and then rests. She has a good understanding of her inhalers and  uses her spacer. Bethany [name] her brother helping her find a poMarine scientistI gave her the 2017 [facility] reference. Her blood pressure has been acceptable and she is compliant with her BP medication. ShRhylans satisfied  with what she is doing in exercise.  Her BP has been good and she can do more ADLS easier since starting the program.  She has moved up in time on the TM, and is incresing her strength work to 3 lb weights.    Expected Outcomes  Patient will use PLB to help control Sat's and SOB during exercise and ADL's.  ShIfeill continue to see imporvent un her overall fitness and endurance with regular participation. Continue progressing with her exercise and learning helpful information for managing her COPD. ShRobertoill continue to see progress with strength and stamina with continued participation in [place]      Core Components/Risk Factors/Patient Goals at Discharge (Final Review):      Goals and Risk Factor Review - 06/12/16 1222      Core Components/Risk Factors/Patient Goals Review   Personal Goals Review Increase Strength and Stamina;Improve shortness of breath with ADL's   Review ShAleighas satisfied  with what she is doing in exercise.  Her BP has been good and she can do more ADLS easier since starting the program.  She has moved up in time on the TM, and is incresing her strength work to 3 lb weights.   Expected Outcomes ShLeviill continue to see progress with strength and stamina with continued participation in [place]     ITP Comments:     ITP Comments    Row Name 05/08/16 1058           ITP Comments "Know Your Numbers" education was completed today with learning goals met.           Comments: 30 day note review

## 2016-07-01 DIAGNOSIS — J449 Chronic obstructive pulmonary disease, unspecified: Secondary | ICD-10-CM | POA: Diagnosis not present

## 2016-07-01 DIAGNOSIS — J9620 Acute and chronic respiratory failure, unspecified whether with hypoxia or hypercapnia: Secondary | ICD-10-CM | POA: Diagnosis not present

## 2016-07-01 DIAGNOSIS — I82409 Acute embolism and thrombosis of unspecified deep veins of unspecified lower extremity: Secondary | ICD-10-CM | POA: Diagnosis not present

## 2016-07-01 DIAGNOSIS — I10 Essential (primary) hypertension: Secondary | ICD-10-CM | POA: Diagnosis not present

## 2016-07-07 DIAGNOSIS — M150 Primary generalized (osteo)arthritis: Secondary | ICD-10-CM | POA: Diagnosis not present

## 2016-07-07 DIAGNOSIS — J208 Acute bronchitis due to other specified organisms: Secondary | ICD-10-CM | POA: Diagnosis not present

## 2016-07-07 DIAGNOSIS — J449 Chronic obstructive pulmonary disease, unspecified: Secondary | ICD-10-CM | POA: Diagnosis not present

## 2016-07-07 DIAGNOSIS — I10 Essential (primary) hypertension: Secondary | ICD-10-CM | POA: Diagnosis not present

## 2016-07-07 DIAGNOSIS — R8290 Unspecified abnormal findings in urine: Secondary | ICD-10-CM | POA: Diagnosis not present

## 2016-07-07 DIAGNOSIS — B9689 Other specified bacterial agents as the cause of diseases classified elsewhere: Secondary | ICD-10-CM | POA: Diagnosis not present

## 2016-07-07 DIAGNOSIS — M5416 Radiculopathy, lumbar region: Secondary | ICD-10-CM | POA: Diagnosis not present

## 2016-07-07 DIAGNOSIS — Z87891 Personal history of nicotine dependence: Secondary | ICD-10-CM | POA: Diagnosis not present

## 2016-07-07 DIAGNOSIS — I739 Peripheral vascular disease, unspecified: Secondary | ICD-10-CM | POA: Diagnosis not present

## 2016-07-08 ENCOUNTER — Telehealth: Payer: Self-pay | Admitting: Respiratory Therapy

## 2016-07-08 ENCOUNTER — Encounter: Payer: Self-pay | Admitting: Respiratory Therapy

## 2016-07-08 DIAGNOSIS — J449 Chronic obstructive pulmonary disease, unspecified: Secondary | ICD-10-CM

## 2016-07-08 NOTE — Telephone Encounter (Signed)
Called Bethany [name] today - last attended 06/19/16. Bethany [name] states she has been sick with bronchitis. She had an appointment with her physician 07/06/16 and was put on an antibiotic. She hopes to return to [name] soon.

## 2016-07-10 ENCOUNTER — Encounter: Payer: Commercial Managed Care - HMO | Attending: Physical Medicine and Rehabilitation

## 2016-07-10 DIAGNOSIS — J449 Chronic obstructive pulmonary disease, unspecified: Secondary | ICD-10-CM | POA: Insufficient documentation

## 2016-07-20 ENCOUNTER — Encounter: Payer: Self-pay | Admitting: Respiratory Therapy

## 2016-07-20 DIAGNOSIS — J449 Chronic obstructive pulmonary disease, unspecified: Secondary | ICD-10-CM

## 2016-07-20 DIAGNOSIS — M5136 Other intervertebral disc degeneration, lumbar region: Secondary | ICD-10-CM | POA: Diagnosis not present

## 2016-07-20 DIAGNOSIS — M5416 Radiculopathy, lumbar region: Secondary | ICD-10-CM | POA: Diagnosis not present

## 2016-07-20 DIAGNOSIS — M1711 Unilateral primary osteoarthritis, right knee: Secondary | ICD-10-CM | POA: Diagnosis not present

## 2016-07-20 NOTE — Progress Notes (Signed)
Pulmonary Individual Treatment Plan  Patient Details  Name: ALYIAH ULLOA MRN: [id number] Date of Birth: 14-Jun-1935 Referring Provider:   Flowsheet Row Pulmonary Rehab from 04/28/2016 in [facility] Cardiac and Pulmonary Rehab  Referring Provider  Chasnis      Initial Encounter Date:  Flowsheet Row Pulmonary Rehab from 04/28/2016 in [facility] Cardiac and Pulmonary Rehab  Date  04/28/16  Referring Provider  Chasnis      Visit Diagnosis: Chronic obstructive pulmonary disease, unspecified COPD type ([place])  Patient's Home Medications on Admission:  Current Outpatient Prescriptions:    albuterol (PROVENTIL HFA;VENTOLIN HFA) 108 (90 Base) MCG/ACT inhaler, Inhale 2 puffs into the lungs every 6 (six) hours as needed for wheezing or shortness of breath., Disp: 1 Inhaler, Rfl: 2   aspirin (ASPIRIN EC) 81 MG EC tablet, Take 81 mg by mouth daily. Swallow whole., Disp: , Rfl:    azithromycin (ZITHROMAX Z-PAK) 250 MG tablet, Take 2 tablets (500 mg) on  Day 1,  followed by 1 tablet (250 mg) once daily on Days 2 through 5. (Patient not taking: Reported on 05/28/2016), Disp: 6 each, Rfl: 0   baclofen (LIORESAL) 10 MG tablet, Take 10 mg by mouth 3 (three) times daily., Disp: , Rfl:    cetirizine (ZYRTEC) 10 MG tablet, Take 10 mg by mouth daily., Disp: , Rfl:    diazepam (VALIUM) 5 MG tablet, Take 5 mg by mouth at bedtime. Patient takes once at night Reported on 10/16/2015, Disp: , Rfl:    docusate sodium (COLACE) 100 MG capsule, Take 100 mg by mouth 2 (two) times daily as needed for mild constipation., Disp: , Rfl:    escitalopram (LEXAPRO) 5 MG tablet, Take 10 mg by mouth daily. , Disp: , Rfl:    Fluticasone-Salmeterol (ADVAIR) 250-50 MCG/DOSE AEPB, Inhale 1 puff into the lungs 2 (two) times daily., Disp: , Rfl:    gabapentin (NEURONTIN) 300 MG capsule, Take 300 mg by mouth 3 (three) times daily., Disp: , Rfl:    HYDROcodone-acetaminophen (NORCO/VICODIN) 5-325 MG tablet, Take 1 tablet by mouth 5 (five)  times daily as needed for moderate pain. Do not exceed 5 times daily, patient at least 3 day, Disp: , Rfl:    losartan (COZAAR) 50 MG tablet, Take 50 mg by mouth daily., Disp: , Rfl:    lovastatin (MEVACOR) 20 MG tablet, Take 20 mg by mouth at bedtime. Reported on 12/12/2015, Disp: , Rfl:    metoprolol succinate (TOPROL-XL) 25 MG 24 hr tablet, Take 25 mg by mouth daily., Disp: , Rfl:    montelukast (SINGULAIR) 10 MG tablet, Take 10 mg by mouth at bedtime. Reported on 12/12/2015, Disp: , Rfl:    predniSONE (STERAPRED UNI-PAK 21 TAB) 10 MG (21) TBPK tablet, Dispense steroid taper pack as directed (Patient not taking: Reported on 05/28/2016), Disp: 21 tablet, Rfl: 0   tamsulosin (FLOMAX) 0.4 MG CAPS capsule, Take 0.4 mg by mouth daily after supper., Disp: , Rfl:    vitamin B-12 (CYANOCOBALAMIN) 1000 MCG tablet, Take 1,000 mcg by mouth daily., Disp: , Rfl:   Past Medical History: Past Medical History:  Diagnosis Date   Allergy    Anxiety    Arthritis    COPD (chronic obstructive pulmonary disease) ([place])    Hyperlipidemia    Hypertension    Oxygen deficiency     Tobacco Use: History  Smoking Status   Former Smoker   Quit date: 10/06/2014  Smokeless Tobacco   Never Used    Labs: Recent Review Scientist, physiological  There is no flowsheet data to display.       ADL UCSD:     Pulmonary Assessment Scores    Row Name 04/28/16 1154         ADL UCSD   ADL Phase Entry     SOB Score total 82     Rest 1     Walk 3     Stairs 5     Bath 5     Dress 3     Shop 2        Pulmonary Function Assessment:     Pulmonary Function Assessment - 04/28/16 1152      Pulmonary Function Tests   FVC% 41 %   FEV1% 39 %   FEV1/FVC Ratio 70.49     Initial Spirometry Results   Comments Test date 04/28/16     Breath   Bilateral Breath Sounds Clear   Shortness of Breath Yes;Limiting activity;Fear of Shortness of Breath      Exercise Target Goals:    Exercise Program  Goal: Individual exercise prescription set with THRR, safety & activity barriers. Participant demonstrates ability to understand and report RPE using BORG scale, to self-measure pulse accurately, and to acknowledge the importance of the exercise prescription.  Exercise Prescription Goal: Starting with aerobic activity 30 plus minutes a day, 3 days per week for initial exercise prescription. Provide home exercise prescription and guidelines that participant acknowledges understanding prior to discharge.  Activity Barriers & Risk Stratification:     Activity Barriers & Cardiac Risk Stratification - 04/28/16 1151      Activity Barriers & Cardiac Risk Stratification   Activity Barriers Back Problems;Shortness of Breath;Deconditioning   Cardiac Risk Stratification Moderate      6 Minute Walk:     6 Minute Walk    Row Name 04/28/16 1228 06/19/16 1204       6 Minute Walk   Phase  -- Mid Program    Distance 675 feet 900 feet    Distance % Change  -- 33 %    Walk Time 5.4 minutes 6 minutes    # of Rest Breaks 3 0    MPH 1.42 1.7    METS 2.07 2.55    RPE  -- 11    Perceived Dyspnea   -- 3    VO2 Peak 5.97 8.9    Symptoms No No    Resting HR 73 bpm 86 bpm    Resting BP 128/70 148/72    Max Ex. HR 112 bpm 127 bpm    Max Ex. BP 148/84 178/76      Interval HR   Baseline HR 73 86    1 Minute HR 106 108    2 Minute HR 103 117    3 Minute HR 109 124    4 Minute HR 105 127    5 Minute HR 112 127    6 Minute HR 112 110    2 Minute Post HR 96  --    Interval Heart Rate? Yes Yes      Interval Oxygen   Interval Oxygen?  -- Yes    Baseline Oxygen Saturation %  -- 96 %    Baseline Liters of Oxygen  -- 3 L    1 Minute Oxygen Saturation %  -- 92 %    1 Minute Liters of Oxygen  -- 3 L    2 Minute Oxygen Saturation %  -- 90 %    2 Minute  Liters of Oxygen  -- 3 L    3 Minute Oxygen Saturation %  -- 90 %    3 Minute Liters of Oxygen  -- 3 L    4 Minute Oxygen Saturation %  -- 90 %     4 Minute Liters of Oxygen  -- 3 L    5 Minute Oxygen Saturation %  -- 88 %    5 Minute Liters of Oxygen  -- 3 L    6 Minute Oxygen Saturation %  -- 90 %    6 Minute Liters of Oxygen  -- 3 L    2 Minute Post Liters of Oxygen  -- 3 L       Initial Exercise Prescription:     Initial Exercise Prescription - 04/28/16 1200      Date of Initial Exercise RX and Referring Provider   Date 04/28/16   Referring Provider Chasnis     Oxygen   Oxygen Continuous   Liters 3     Treadmill   MPH 1.4   Grade 0   Minutes 15  3/3/3/3   METs 2     NuStep   Level 2   Minutes 15   METs 2     Biostep-RELP   Level 2   Minutes 15   METs 2     Prescription Details   Frequency (times per week) 3   Duration Progress to 45 minutes of aerobic exercise without signs/symptoms of physical distress     Intensity   THRR 40-80% of Max Heartrate 100-128   Ratings of Perceived Exertion 11-13   Perceived Dyspnea 0-4     Progression   Progression Continue to progress workloads to maintain intensity without signs/symptoms of physical distress.     Resistance Training   Training Prescription Yes   Weight 2   Reps 10-12      Perform Capillary Blood Glucose checks as needed.  Exercise Prescription Changes:     Exercise Prescription Changes    Row Name 05/04/16 1100 05/13/16 1200 05/20/16 1200 05/28/16 1100 06/11/16 1100     Exercise Review   Progression  -- No  --  --  --     Response to Exercise   Blood Pressure (Admit) 111/58 114/60  -- 148/80 128/70   Blood Pressure (Exercise) 142/70 126/64  -- 150/74 164/84   Blood Pressure (Exit) 132/72 124/70  -- 140/64 110/64   Heart Rate (Admit) 79 bpm 64 bpm  -- 70 bpm 71 bpm   Heart Rate (Exercise) 89 bpm 108 bpm  -- 85 bpm 87 bpm   Heart Rate (Exit) 69 bpm 60 bpm  -- 69 bpm 68 bpm   Oxygen Saturation (Admit) 99 % 96 %  -- 96 % 98 %   Oxygen Saturation (Exercise) 94 % 96 %  -- 94 % 95 %   Oxygen Saturation (Exit) 99 % 97 %  -- 99 % 99 %    Rating of Perceived Exertion (Exercise) 14 13  -- 13 13   Perceived Dyspnea (Exercise) 3 4  -- 3 5   Symptoms  -- yes  -- no no   Comments  -- back pain  Pain is next day after exercise - staff will monitor  --  --  --   Duration  -- Progress to 45 minutes of aerobic exercise without signs/symptoms of physical distress  -- Progress to 45 minutes of aerobic exercise without signs/symptoms of physical distress Progress to 45 minutes  of aerobic exercise without signs/symptoms of physical distress   Intensity  -- THRR unchanged  -- THRR unchanged THRR unchanged     Progression   Progression  -- Continue to progress workloads to maintain intensity without signs/symptoms of physical distress.  -- Continue to progress workloads to maintain intensity without signs/symptoms of physical distress. Continue to progress workloads to maintain intensity without signs/symptoms of physical distress.   Average METs  -- 2  -- 2 2     Resistance Training   Training Prescription Yes Yes  -- Yes Yes   Weight 2 2  -- 2 2   Reps 10-12 10-12  -- 10-15 10-15     Interval Training   Interval Training  -- No  -- No No     Oxygen   Oxygen Continuous Continuous  -- Continuous Continuous   Liters 3 3  -- 3 3     Treadmill   MPH 1.4 1.4  -- 1.4 1.4   Grade 0 0  -- 0 0   Minutes 15  3/3/3/3 15  3/3  -- 15  3/3/3 15  4/4/3   METs 2 2  --  -- 2     NuStep   Level 2 3  -- 4 4   Minutes 15 15  -- 15 15   METs 2 2.1  -- 2.1 2     Biostep-RELP   Level 2 2  -- 2  --   Minutes 15 15  -- 15  --   METs 2 2  -- 2  --     Home Exercise Plan   Plans to continue exercise at  --  -- Home  --  --   Frequency  --  -- Add 2 additional days to program exercise sessions.  --  --   Row Name 06/26/16 0900             Response to Exercise   Blood Pressure (Admit) 148/72       Blood Pressure (Exercise) 178/76       Blood Pressure (Exit) 132/84       Heart Rate (Admit) 86 bpm       Heart Rate (Exercise) 127 bpm        Heart Rate (Exit) 74 bpm       Oxygen Saturation (Admit) 96 %       Oxygen Saturation (Exercise) 93 %       Oxygen Saturation (Exit) 97 %       Rating of Perceived Exertion (Exercise) 11       Perceived Dyspnea (Exercise) 3       Duration Progress to 45 minutes of aerobic exercise without signs/symptoms of physical distress       Intensity THRR unchanged         Progression   Progression Continue to progress workloads to maintain intensity without signs/symptoms of physical distress.         Resistance Training   Training Prescription Yes       Weight 3       Reps 10-15         Interval Training   Interval Training No         Oxygen   Oxygen Continuous       Liters 3         NuStep   Level 4       Minutes 15       METs 2.4  Biostep-RELP   Level 2       Minutes 15       METs 2          Exercise Comments:     Exercise Comments    Row Name 05/04/16 1109 05/13/16 1248 05/20/16 1212 05/28/16 1139 06/03/16 1158   Exercise Comments First full day of exercise!  Patient was oriented to gym and equipment including functions, settings, policies, and procedures.  Patient's individual exercise prescription and treatment plan were reviewed.  All starting workloads were established based on the results of the 6 minute walk test done at initial orientation visit.  The plan for exercise progression was also introduced and progression will be customized based on patient's performance and goals Lakesa has had some back pain the day after exercise.  Delayed muscle soreness was discussed and staff will continue to check in with her.  Stretches and general strength should be helpful. Reviewed home exercise with pt today.  Pt plans to walk with friends for exercise.  Reviewed THR, pulse, RPE, sign and symptoms, NTG use, and when to call 911 or MD.  Also discussed weather considerations and indoor options.  Pt voiced understanding. Zamzam reports her back is less painful than the first  week.  She has added resistance on the NS.   Ms. Heiny was unable to complete her full exercise prescription today due to complaint of "blurred vision" on her first machine, about 5 mins after exercise began.  VS stable, but Blood Glucose was 96 (patient is not a diabetic, but stated she did not eat breakfast this morning).  Ms Hayse was given a pack of cheese crackers and sat out of exercise until symptoms were gone.  She was able to complete her last station without complaint.  Pt was educated regarding importance of eating breakfast prior to exercise.  Will continue to monitor for progression.   [place] Name 06/11/16 1159 06/26/16 0916         Exercise Comments Shamanda is progressing well - interval training will be added to her program and she will continue to add time on the TM. Rodolfo has increased her weight for strength training and is progressing well with exercise.         Discharge Exercise Prescription (Final Exercise Prescription Changes):     Exercise Prescription Changes - 06/26/16 0900      Response to Exercise   Blood Pressure (Admit) 148/72   Blood Pressure (Exercise) 178/76   Blood Pressure (Exit) 132/84   Heart Rate (Admit) 86 bpm   Heart Rate (Exercise) 127 bpm   Heart Rate (Exit) 74 bpm   Oxygen Saturation (Admit) 96 %   Oxygen Saturation (Exercise) 93 %   Oxygen Saturation (Exit) 97 %   Rating of Perceived Exertion (Exercise) 11   Perceived Dyspnea (Exercise) 3   Duration Progress to 45 minutes of aerobic exercise without signs/symptoms of physical distress   Intensity THRR unchanged     Progression   Progression Continue to progress workloads to maintain intensity without signs/symptoms of physical distress.     Resistance Training   Training Prescription Yes   Weight 3   Reps 10-15     Interval Training   Interval Training No     Oxygen   Oxygen Continuous   Liters 3     NuStep   Level 4   Minutes 15   METs 2.4     Biostep-RELP   Level 2    Minutes 15  METs 2       Nutrition:  Target Goals: Understanding of nutrition guidelines, daily intake of sodium <1534m, cholesterol <2015m calories 30% from fat and 7% or less from saturated fats, daily to have 5 or more servings of fruits and vegetables.  Biometrics:     Pre Biometrics - 04/28/16 1227      Pre Biometrics   Height 5' 5"  (1.651 m)   Weight 152 lb 12.8 oz (69.3 kg)   Waist Circumference 36.5 inches   Hip Circumference 40.75 inches   Waist to Hip Ratio 0.9 %   BMI (Calculated) 25.5       Nutrition Therapy Plan and Nutrition Goals:   Nutrition Discharge: Rate Your Plate Scores:   Psychosocial: Target Goals: Acknowledge presence or absence of depression, maximize coping skills, provide positive support system. Participant is able to verbalize types and ability to use techniques and skills needed for reducing stress and depression.  Initial Review & Psychosocial Screening:     Initial Psych Review & Screening - 04/28/16 1157      Family Dynamics   Comments Ms HoLizamas dealing with the lose of her best friend who passed away last week. Her friend had been ill for some time, so she is comforted that she is not suffering anymore. Ms HoCantontates [name] will benfit her to be more active and help her to move on from her friend's lose.her activity and help h      Quality of Life Scores:     Quality of Life - 04/28/16 1203      Quality of Life Scores   Health/Function Pre 21 %   Socioeconomic Pre 20.29 %   Psych/Spiritual Pre 21 %   Family Pre 21 %   GLOBAL Pre 20.84 %      PHQ-9: Recent Review Flowsheet Data    [facility] 04/28/2016 01/01/2016 12/12/2015 11/12/2015 10/16/2015   Decreased Interest 2 0 0 0 0   Down, Depressed, Hopeless 1 0 0 0 0   PHQ - 2 Score 3 0 0 0 0   Altered sleeping 2 - - - -   Tired, decreased energy 3 - - - -   Change in appetite 3 - - - -   Feeling bad or failure about yourself  1 - - - -   Trouble  concentrating 2 - - - -   Moving slowly or fidgety/restless 0 - - - -   Suicidal thoughts 0 - - - -   PHQ-9 Score 14 - - - -   Difficult doing work/chores Somewhat difficult - - - -      Psychosocial Evaluation and Intervention:     Psychosocial Evaluation - 05/04/16 1037      Psychosocial Evaluation & Interventions   Interventions Encouraged to exercise with the program and follow exercise prescription   Comments Counselor met with Ms. HoMoulinoday for initial psychosocial evaluation.  She is a [age]ear old female who was diagnosed with COPD in 2015.  Her support system is limited at this time with a best friend who recently passed away and a brother who lived with Ms. H Lemmie Evensecently moved out.  She has close neighbors and realizes she needs to get back involved with her local church Sunday [employer] class.  She has had multiple back surgeries and has fallen (4) times in the past several years; the last time was one month ago.  She reports both her sleep and her appetite  have been poor but are improving somewhat recently.  She denies a history of depression or anxiety; however, Counselor discussed her PHQ-9 Scores of "14" indicating moderate depression with her.  She admitted since her friend passed this has been very difficult for her.  She states her stressors include just not being able to do those things she used to enjoy.  She has goals to improve her breathing and flexibility and to "quit falling."  Counselor encouraged her to increase her social supports to improve her mood and her feeling of isolation with the recent losses.  She agreed to do so and counselor will be following with her to see if seeing a therapist or a PCP about possible medications for her mood might be warranted in the near future.     Continued Psychosocial Services Needed Yes  Follow on mood - possibly refer for counseling or PCP for med eval.  Recommended to increase social supports.      Psychosocial Re-Evaluation:      Psychosocial Re-Evaluation    [place] Name 06/17/16 1618             Psychosocial Re-Evaluation   Comments [place] psychosocial assessment reveals no barriers at this time to participation in Pulmonary Rehab.  She has good family and friend support that encourages Jenefer to participate in [place] and progress with Her goals.  Milderd concerns are monitored, but she has acknowledge that attending the program has helped to maintain quality life with improved mobility, self-care, and emotional and financial stability.  Natha is commended for regular attendance and self-motivation to improve Her pulmonary disease management. She does have good support from her brother who is now helping find a Marine scientist for her.         Education: Education Goals: Education classes will be provided on a weekly basis, covering required topics. Participant will state understanding/return demonstration of topics presented.  Learning Barriers/Preferences:     Learning Barriers/Preferences - 04/28/16 1152      Learning Barriers/Preferences   Learning Barriers None   Learning Preferences None      Education Topics: Initial Evaluation Education: - Verbal, written and demonstration of respiratory meds, RPE/PD scales, oximetry and breathing techniques. Instruction on use of nebulizers and MDIs: cleaning and proper use, rinsing mouth with steroid doses and importance of monitoring MDI activations. Flowsheet Row Pulmonary Rehab from 06/17/2016 in [facility] Cardiac and Pulmonary Rehab  Date  04/28/16  Educator  LB  Instruction Review Code  2- meets goals/outcomes      General Nutrition Guidelines/Fats and Fiber: -Group instruction provided by verbal, written material, models and posters to present the general guidelines for heart healthy nutrition. Gives an explanation and review of dietary fats and fiber.   Controlling Sodium/Reading Food Labels: -Group verbal and written material supporting the  discussion of sodium use in heart healthy nutrition. Review and explanation with models, verbal and written materials for utilization of the food label. Flowsheet Row Pulmonary Rehab from 06/17/2016 in [facility] Cardiac and Pulmonary Rehab  Date  05/04/16  Educator  CR  Instruction Review Code  2- meets goals/outcomes      Exercise Physiology & Risk Factors: - Group verbal and written instruction with models to review the exercise physiology of the cardiovascular system and associated critical values. Details cardiovascular disease risk factors and the goals associated with each risk factor. Flowsheet Row Pulmonary Rehab from 06/17/2016 in [facility] Cardiac and Pulmonary Rehab  Date  06/10/16  Educator  [facility]  Instruction Review  Code  2- meets goals/outcomes      Aerobic Exercise & Resistance Training: - Gives group verbal and written discussion on the health impact of inactivity. On the components of aerobic and resistive training programs and the benefits of this training and how to safely progress through these programs.   Flexibility, Balance, General Exercise Guidelines: - Provides group verbal and written instruction on the benefits of flexibility and balance training programs. Provides general exercise guidelines with specific guidelines to those with heart or lung disease. Demonstration and skill practice provided.   Stress Management: - Provides group verbal and written instruction about the health risks of elevated stress, cause of high stress, and healthy ways to reduce stress.   Depression: - Provides group verbal and written instruction on the correlation between heart/lung disease and depressed mood, treatment options, and the stigmas associated with seeking treatment. Flowsheet Row Pulmonary Rehab from 06/17/2016 in [facility] Cardiac and Pulmonary Rehab  Date  05/11/16  Educator  [facility]  Instruction Review Code  2- meets goals/outcomes      Exercise & Equipment Safety: - Individual  verbal instruction and demonstration of equipment use and safety with use of the equipment. Flowsheet Row Pulmonary Rehab from 06/17/2016 in [facility] Cardiac and Pulmonary Rehab  Date  05/04/16  Educator  [facility]  Instruction Review Code  2- meets goals/outcomes      Infection Prevention: - Provides verbal and written material to individual with discussion of infection control including proper hand washing and proper equipment cleaning during exercise session. Flowsheet Row Pulmonary Rehab from 06/17/2016 in [facility] Cardiac and Pulmonary Rehab  Date  05/04/16  Educator  [facility]  Instruction Review Code  2- meets goals/outcomes      Falls Prevention: - Provides verbal and written material to individual with discussion of falls prevention and safety. Flowsheet Row Pulmonary Rehab from 06/17/2016 in [facility] Cardiac and Pulmonary Rehab  Date  04/28/16  Educator  LB  Instruction Review Code  2- meets goals/outcomes      Diabetes: - Individual verbal and written instruction to review signs/symptoms of diabetes, desired ranges of glucose level fasting, after meals and with exercise. Advice that pre and post exercise glucose checks will be done for 3 sessions at entry of program.   Chronic Lung Diseases: - Group verbal and written instruction to review new updates, new respiratory medications, new advancements in procedures and treatments. Provide informative websites and "800" numbers of self-education.   Lung Procedures: - Group verbal and written instruction to describe testing methods done to diagnose lung disease. Review the outcome of test results. Describe the treatment choices: Pulmonary Function Tests, ABGs and oximetry.   Energy Conservation: - Provide group verbal and written instruction for methods to conserve energy, plan and organize activities. Instruct on pacing techniques, use of adaptive equipment and posture/positioning to relieve shortness of breath.   Triggers: - Group verbal and  written instruction to review types of environmental controls: home humidity, furnaces, filters, dust mite/pet prevention, HEPA vacuums. To discuss weather changes, air quality and the benefits of nasal washing. Flowsheet Row Pulmonary Rehab from 06/17/2016 in [facility] Cardiac and Pulmonary Rehab  Date  06/03/16  Educator  LB  Instruction Review Code  2- meets goals/outcomes      Exacerbations: - Group verbal and written instruction to provide: warning signs, infection symptoms, calling MD promptly, preventive modes, and value of vaccinations. Review: effective airway clearance, coughing and/or vibration techniques. Create an Sports administrator. Flowsheet Row Pulmonary Rehab from 06/17/2016 in [facility]  Cardiac and Pulmonary Rehab  Date  05/13/16  Educator  LB  Instruction Review Code  2- meets goals/outcomes      Oxygen: - Individual and group verbal and written instruction on oxygen therapy. Includes supplement oxygen, available portable oxygen systems, continuous and intermittent flow rates, oxygen safety, concentrators, and Medicare reimbursement for oxygen. Flowsheet Row Pulmonary Rehab from 06/17/2016 in [facility] Cardiac and Pulmonary Rehab  Date  04/28/16  Educator  LB  Instruction Review Code  2- meets goals/outcomes      Respiratory Medications: - Group verbal and written instruction to review medications for lung disease. Drug class, frequency, complications, importance of spacers, rinsing mouth after steroid MDI's, and proper cleaning methods for nebulizers. Flowsheet Row Pulmonary Rehab from 06/17/2016 in [facility] Cardiac and Pulmonary Rehab  Date  04/28/16  Educator  LB  Instruction Review Code  2- meets goals/outcomes      AED/CPR: - Group verbal and written instruction with the use of models to demonstrate the basic use of the AED with the basic ABC's of resuscitation.   Breathing Retraining: - Provides individuals verbal and written instruction on purpose, frequency, and proper technique  of diaphragmatic breathing and pursed-lipped breathing. Applies individual practice skills. Flowsheet Row Pulmonary Rehab from 06/17/2016 in [facility] Cardiac and Pulmonary Rehab  Date  05/04/16  Educator  [facility]  Instruction Review Code  2- meets goals/outcomes      Anatomy and Physiology of the Lungs: - Group verbal and written instruction with the use of models to provide basic lung anatomy and physiology related to function, structure and complications of lung disease. Flowsheet Row Pulmonary Rehab from 06/17/2016 in [facility] Cardiac and Pulmonary Rehab  Date  05/06/16  Educator  LB  Instruction Review Code  2- meets goals/outcomes      Heart Failure: - Group verbal and written instruction on the basics of heart failure: signs/symptoms, treatments, explanation of ejection fraction, enlarged heart and cardiomyopathy. Flowsheet Row Pulmonary Rehab from 06/17/2016 in [facility] Cardiac and Pulmonary Rehab  Date  05/22/16  Educator  SB  Instruction Review Code  2- meets goals/outcomes      Sleep Apnea: - Individual verbal and written instruction to review Obstructive Sleep Apnea. Review of risk factors, methods for diagnosing and types of masks and machines for OSA.   Anxiety: - Provides group, verbal and written instruction on the correlation between heart/lung disease and anxiety, treatment options, and management of anxiety. Flowsheet Row Pulmonary Rehab from 06/17/2016 in [facility] Cardiac and Pulmonary Rehab  Date  06/17/16  Educator  [facility]  Instruction Review Code  2- Meets goals/outcomes      Relaxation: - Provides group, verbal and written instruction about the benefits of relaxation for patients with heart/lung disease. Also provides patients with examples of relaxation techniques.   Knowledge Questionnaire Score:     Knowledge Questionnaire Score - 04/28/16 1152      Knowledge Questionnaire Score   Pre Score 10/10       Core Components/Risk Factors/Patient Goals at Admission:      Personal Goals and Risk Factors at Admission - 04/28/16 1155      Core Components/Risk Factors/Patient Goals on Admission   Sedentary Yes   Intervention Provide advice, education, support and counseling about physical activity/exercise needs.;Develop an individualized exercise prescription for aerobic and resistive training based on initial evaluation findings, risk stratification, comorbidities and participant's personal goals.   Expected Outcomes Achievement of increased cardiorespiratory fitness and enhanced flexibility, muscular endurance and strength shown through measurements of  functional capacity and personal statement of participant.   Increase Strength and Stamina Yes   Intervention Provide advice, education, support and counseling about physical activity/exercise needs.;Develop an individualized exercise prescription for aerobic and resistive training based on initial evaluation findings, risk stratification, comorbidities and participant's personal goals.   Expected Outcomes Achievement of increased cardiorespiratory fitness and enhanced flexibility, muscular endurance and strength shown through measurements of functional capacity and personal statement of participant.   Improve shortness of breath with ADL's Yes   Intervention Provide education, individualized exercise plan and daily activity instruction to help decrease symptoms of SOB with activities of daily living.   Expected Outcomes Short Term: Achieves a reduction of symptoms when performing activities of daily living.   Develop more efficient breathing techniques such as purse lipped breathing and diaphragmatic breathing; and practicing self-pacing with activity Yes   Intervention Provide education, demonstration and support about specific breathing techniuqes utilized for more efficient breathing. Include techniques such as pursed lipped breathing, diaphragmatic breathing and self-pacing activity.   Expected Outcomes Short Term:  Participant will be able to demonstrate and use breathing techniques as needed throughout daily activities.   Increase knowledge of respiratory medications and ability to use respiratory devices properly  Yes  3l/m oxygen, Advair, Proventil MDI, Spacer given   Intervention Provide education and demonstration as needed of appropriate use of medications, inhalers, and oxygen therapy.   Expected Outcomes Short Term: Achieves understanding of medications use. Understands that oxygen is a medication prescribed by physician. Demonstrates appropriate use of inhaler and oxygen therapy.   Hypertension Yes   Intervention Provide education on lifestyle modifcations including regular physical activity/exercise, weight management, moderate sodium restriction and increased consumption of fresh fruit, vegetables, and low fat dairy, alcohol moderation, and smoking cessation.;Monitor prescription use compliance.   Expected Outcomes Short Term: Continued assessment and intervention until BP is < 140/61m HG in hypertensive participants. < 130/858mHG in hypertensive participants with diabetes, heart failure or chronic kidney disease.;Long Term: Maintenance of blood pressure at goal levels.      Core Components/Risk Factors/Patient Goals Review:      Goals and Risk Factor Review    Row Name 05/04/16 1109 05/18/16 1341 06/10/16 1356 06/12/16 1222       Core Components/Risk Factors/Patient Goals Review   Personal Goals Review Develop more efficient breathing techniques such as purse lipped breathing and diaphragmatic breathing and practicing self-pacing with activity. Increase Strength and Stamina Sedentary;Increase Strength and Stamina;Improve shortness of breath with ADL's;Increase knowledge of respiratory medications and ability to use respiratory devices properly.;Develop more efficient breathing techniques such as purse lipped breathing and diaphragmatic breathing and practicing self-pacing with  activity.;Hypertension Increase Strength and Stamina;Improve shortness of breath with ADL's    Review PLB technique was reviewed with the patinet. She demonstrated understanding of this technique.  ShAlmetas feeling good about exercise - it is "not nearly as hard" as when she first started.   Ms HoSmoots progressing with her exercise goals and education. She attends regularly and is very interested in the education. She states she has more energy , and although Ms HoFreemantill has increased shortness of breath, she is learning to pace herself and use PLB. For example, she vacuums one room at a time and then rests. She has a good understanding of her inhalers and  uses her spacer. Ms HoBreneras her brother helping her find a poMarine scientistI gave her the 2017 [facility] reference. Her blood pressure has  been acceptable and she is compliant with her BP medication. Gayle is satisfied  with what she is doing in exercise.  Her BP has been good and she can do more ADLS easier since starting the program.  She has moved up in time on the TM, and is incresing her strength work to 3 lb weights.    Expected Outcomes Patient will use PLB to help control Sat's and SOB during exercise and ADL's.  Alianny will continue to see imporvent un her overall fitness and endurance with regular participation. Continue progressing with her exercise and learning helpful information for managing her COPD. Doni will continue to see progress with strength and stamina with continued participation in [place].       Core Components/Risk Factors/Patient Goals at Discharge (Final Review):      Goals and Risk Factor Review - 06/12/16 1222      Core Components/Risk Factors/Patient Goals Review   Personal Goals Review Increase Strength and Stamina;Improve shortness of breath with ADL's   Review Otis is satisfied  with what she is doing in exercise.  Her BP has been good and she can do more ADLS easier since  starting the program.  She has moved up in time on the TM, and is incresing her strength work to 3 lb weights.   Expected Outcomes Fidela will continue to see progress with strength and stamina with continued participation in [place].      ITP Comments:     ITP Comments    Row Name 05/08/16 1058 07/08/16 1507         ITP Comments "Know Your Numbers" education was completed today with learning goals met.  Called Ms Mudry today - last attended 06/19/16. Ms Sweeten states she has been sick with bronchitis. She had an appointment with her physician 07/06/16 and was put on an antibiotic. She hopes to return to [place] soon.         Comments:  30 Day Note Review

## 2016-08-01 DIAGNOSIS — I10 Essential (primary) hypertension: Secondary | ICD-10-CM | POA: Diagnosis not present

## 2016-08-01 DIAGNOSIS — J449 Chronic obstructive pulmonary disease, unspecified: Secondary | ICD-10-CM | POA: Diagnosis not present

## 2016-08-01 DIAGNOSIS — J9620 Acute and chronic respiratory failure, unspecified whether with hypoxia or hypercapnia: Secondary | ICD-10-CM | POA: Diagnosis not present

## 2016-08-01 DIAGNOSIS — I82409 Acute embolism and thrombosis of unspecified deep veins of unspecified lower extremity: Secondary | ICD-10-CM | POA: Diagnosis not present

## 2016-08-04 DIAGNOSIS — R0902 Hypoxemia: Secondary | ICD-10-CM | POA: Diagnosis not present

## 2016-08-04 DIAGNOSIS — I10 Essential (primary) hypertension: Secondary | ICD-10-CM | POA: Diagnosis not present

## 2016-08-04 DIAGNOSIS — J449 Chronic obstructive pulmonary disease, unspecified: Secondary | ICD-10-CM | POA: Diagnosis not present

## 2016-08-04 DIAGNOSIS — J9610 Chronic respiratory failure, unspecified whether with hypoxia or hypercapnia: Secondary | ICD-10-CM | POA: Diagnosis not present

## 2016-08-07 ENCOUNTER — Encounter: Payer: Commercial Managed Care - HMO | Attending: Physical Medicine and Rehabilitation

## 2016-08-07 DIAGNOSIS — J449 Chronic obstructive pulmonary disease, unspecified: Secondary | ICD-10-CM | POA: Insufficient documentation

## 2016-08-10 ENCOUNTER — Encounter: Payer: Self-pay | Admitting: Respiratory Therapy

## 2016-08-10 ENCOUNTER — Telehealth: Payer: Self-pay | Admitting: Respiratory Therapy

## 2016-08-10 NOTE — Telephone Encounter (Signed)
Called Bethany [name] - last attended 06/19/16. Bethany [name] has been sick with bronchitis and has had some problems with bladder control. She is still planning to return to [name].

## 2016-08-17 ENCOUNTER — Encounter: Payer: Self-pay | Admitting: Respiratory Therapy

## 2016-08-17 DIAGNOSIS — J449 Chronic obstructive pulmonary disease, unspecified: Secondary | ICD-10-CM

## 2016-08-17 NOTE — Progress Notes (Signed)
Pulmonary Individual Treatment Plan  Patient Details  Name: Bethany [name] MRN: [id number] Date of Birth: 27-Oct-1935 Referring Provider:   Flowsheet Row Pulmonary Rehab from 04/28/2016 in [facility] Cardiac and Pulmonary Rehab  Referring Provider  Chasnis      Initial Encounter Date:  Flowsheet Row Pulmonary Rehab from 04/28/2016 in [facility] Cardiac and Pulmonary Rehab  Date  04/28/16  Referring Provider  Chasnis      Visit Diagnosis: Chronic obstructive pulmonary disease, unspecified COPD type ([place])  Patient's Home Medications on Admission:  Current Outpatient Prescriptions:    albuterol (PROVENTIL HFA;VENTOLIN HFA) 108 (90 Base) MCG/ACT inhaler, Inhale 2 puffs into the lungs every 6 (six) hours as needed for wheezing or shortness of breath., Disp: 1 Inhaler, Rfl: 2   aspirin (ASPIRIN EC) 81 MG EC tablet, Take 81 mg by mouth daily. Swallow whole., Disp: , Rfl:    azithromycin (ZITHROMAX Z-PAK) 250 MG tablet, Take 2 tablets (500 mg) on  Day 1,  followed by 1 tablet (250 mg) once daily on Days 2 through 5. (Patient not taking: Reported on 05/28/2016), Disp: 6 each, Rfl: 0   baclofen (LIORESAL) 10 MG tablet, Take 10 mg by mouth 3 (three) times daily., Disp: , Rfl:    cetirizine (ZYRTEC) 10 MG tablet, Take 10 mg by mouth daily., Disp: , Rfl:    diazepam (VALIUM) 5 MG tablet, Take 5 mg by mouth at bedtime. Patient takes once at night Reported on 10/16/2015, Disp: , Rfl:    docusate sodium (COLACE) 100 MG capsule, Take 100 mg by mouth 2 (two) times daily as needed for mild constipation., Disp: , Rfl:    escitalopram (LEXAPRO) 5 MG tablet, Take 10 mg by mouth daily. , Disp: , Rfl:    Fluticasone-Salmeterol (ADVAIR) 250-50 MCG/DOSE AEPB, Inhale 1 puff into the lungs 2 (two) times daily., Disp: , Rfl:    gabapentin (NEURONTIN) 300 MG capsule, Take 300 mg by mouth 3 (three) times daily., Disp: , Rfl:    HYDROcodone-acetaminophen (NORCO/VICODIN) 5-325 MG tablet, Take 1 tablet by mouth 5 (five)  times daily as needed for moderate pain. Do not exceed 5 times daily, patient at least 3 day, Disp: , Rfl:    losartan (COZAAR) 50 MG tablet, Take 50 mg by mouth daily., Disp: , Rfl:    lovastatin (MEVACOR) 20 MG tablet, Take 20 mg by mouth at bedtime. Reported on 12/12/2015, Disp: , Rfl:    metoprolol succinate (TOPROL-XL) 25 MG 24 hr tablet, Take 25 mg by mouth daily., Disp: , Rfl:    montelukast (SINGULAIR) 10 MG tablet, Take 10 mg by mouth at bedtime. Reported on 12/12/2015, Disp: , Rfl:    predniSONE (STERAPRED UNI-PAK 21 TAB) 10 MG (21) TBPK tablet, Dispense steroid taper pack as directed (Patient not taking: Reported on 05/28/2016), Disp: 21 tablet, Rfl: 0   tamsulosin (FLOMAX) 0.4 MG CAPS capsule, Take 0.4 mg by mouth daily after supper., Disp: , Rfl:    vitamin B-12 (CYANOCOBALAMIN) 1000 MCG tablet, Take 1,000 mcg by mouth daily., Disp: , Rfl:   Past Medical History: Past Medical History:  Diagnosis Date   Allergy    Anxiety    Arthritis    COPD (chronic obstructive pulmonary disease) ([place])    Hyperlipidemia    Hypertension    Oxygen deficiency     Tobacco Use: History  Smoking Status   Former Smoker   Quit date: 10/06/2014  Smokeless Tobacco   Never Used    Labs: Recent Review Scientist, physiological  There is no flowsheet data to display.       ADL UCSD:     Pulmonary Assessment Scores    Row Name 04/28/16 1154         ADL UCSD   ADL Phase Entry     SOB Score total 82     Rest 1     Walk 3     Stairs 5     Bath 5     Dress 3     Shop 2        Pulmonary Function Assessment:     Pulmonary Function Assessment - 04/28/16 1152      Pulmonary Function Tests   FVC% 41 %   FEV1% 39 %   FEV1/FVC Ratio 70.49     Initial Spirometry Results   Comments Test date 04/28/16     Breath   Bilateral Breath Sounds Clear   Shortness of Breath Yes;Limiting activity;Fear of Shortness of Breath      Exercise Target Goals:    Exercise Program  Goal: Individual exercise prescription set with THRR, safety & activity barriers. Participant demonstrates ability to understand and report RPE using BORG scale, to self-measure pulse accurately, and to acknowledge the importance of the exercise prescription.  Exercise Prescription Goal: Starting with aerobic activity 30 plus minutes a day, 3 days per week for initial exercise prescription. Provide home exercise prescription and guidelines that participant acknowledges understanding prior to discharge.  Activity Barriers & Risk Stratification:     Activity Barriers & Cardiac Risk Stratification - 04/28/16 1151      Activity Barriers & Cardiac Risk Stratification   Activity Barriers Back Problems;Shortness of Breath;Deconditioning   Cardiac Risk Stratification Moderate      6 Minute Walk:     6 Minute Walk    Row Name 04/28/16 1228 06/19/16 1204       6 Minute Walk   Phase  -- Mid Program    Distance 675 feet 900 feet    Distance % Change  -- 33 %    Walk Time 5.4 minutes 6 minutes    # of Rest Breaks 3 0    MPH 1.42 1.7    METS 2.07 2.55    RPE  -- 11    Perceived Dyspnea   -- 3    VO2 Peak 5.97 8.9    Symptoms No No    Resting HR 73 bpm 86 bpm    Resting BP 128/70 148/72    Max Ex. HR 112 bpm 127 bpm    Max Ex. BP 148/84 178/76      Interval HR   Baseline HR 73 86    1 Minute HR 106 108    2 Minute HR 103 117    3 Minute HR 109 124    4 Minute HR 105 127    5 Minute HR 112 127    6 Minute HR 112 110    2 Minute Post HR 96  --    Interval Heart Rate? Yes Yes      Interval Oxygen   Interval Oxygen?  -- Yes    Baseline Oxygen Saturation %  -- 96 %    Baseline Liters of Oxygen  -- 3 L    1 Minute Oxygen Saturation %  -- 92 %    1 Minute Liters of Oxygen  -- 3 L    2 Minute Oxygen Saturation %  -- 90 %    2 Minute  Liters of Oxygen  -- 3 L    3 Minute Oxygen Saturation %  -- 90 %    3 Minute Liters of Oxygen  -- 3 L    4 Minute Oxygen Saturation %  -- 90 %     4 Minute Liters of Oxygen  -- 3 L    5 Minute Oxygen Saturation %  -- 88 %    5 Minute Liters of Oxygen  -- 3 L    6 Minute Oxygen Saturation %  -- 90 %    6 Minute Liters of Oxygen  -- 3 L    2 Minute Post Liters of Oxygen  -- 3 L      Oxygen Initial Assessment:   Oxygen Re-Evaluation:   Oxygen Discharge (Final Oxygen Re-Evaluation):   Initial Exercise Prescription:     Initial Exercise Prescription - 04/28/16 1200      Date of Initial Exercise RX and Referring Provider   Date 04/28/16   Referring Provider Chasnis     Oxygen   Oxygen Continuous   Liters 3     Treadmill   MPH 1.4   Grade 0   Minutes 15  3/3/3/3   METs 2     NuStep   Level 2   Minutes 15   METs 2     Biostep-RELP   Level 2   Minutes 15   METs 2     Prescription Details   Frequency (times per week) 3   Duration Progress to 45 minutes of aerobic exercise without signs/symptoms of physical distress     Intensity   THRR 40-80% of Max Heartrate 100-128   Ratings of Perceived Exertion 11-13   Perceived Dyspnea 0-4     Progression   Progression Continue to progress workloads to maintain intensity without signs/symptoms of physical distress.     Resistance Training   Training Prescription Yes   Weight 2   Reps 10-12      Perform Capillary Blood Glucose checks as needed.  Exercise Prescription Changes:     Exercise Prescription Changes    Row Name 05/04/16 1100 05/13/16 1200 05/20/16 1200 05/28/16 1100 06/11/16 1100     Response to Exercise   Blood Pressure (Admit) 111/58 114/60  -- 148/80 128/70   Blood Pressure (Exercise) 142/70 126/64  -- 150/74 164/84   Blood Pressure (Exit) 132/72 124/70  -- 140/64 110/64   Heart Rate (Admit) 79 bpm 64 bpm  -- 70 bpm 71 bpm   Heart Rate (Exercise) 89 bpm 108 bpm  -- 85 bpm 87 bpm   Heart Rate (Exit) 69 bpm 60 bpm  -- 69 bpm 68 bpm   Oxygen Saturation (Admit) 99 % 96 %  -- 96 % 98 %   Oxygen Saturation (Exercise) 94 % 96 %  -- 94 % 95 %    Oxygen Saturation (Exit) 99 % 97 %  -- 99 % 99 %   Rating of Perceived Exertion (Exercise) 14 13  -- 13 13   Perceived Dyspnea (Exercise) 3 4  -- 3 5   Symptoms  -- yes  -- no no   Comments  -- back pain  Pain is next day after exercise - staff will monitor  --  --  --   Duration  -- Progress to 45 minutes of aerobic exercise without signs/symptoms of physical distress  -- Progress to 45 minutes of aerobic exercise without signs/symptoms of physical distress Progress to 45 minutes of aerobic exercise  without signs/symptoms of physical distress   Intensity  -- THRR unchanged  -- THRR unchanged THRR unchanged     Progression   Progression  -- Continue to progress workloads to maintain intensity without signs/symptoms of physical distress.  -- Continue to progress workloads to maintain intensity without signs/symptoms of physical distress. Continue to progress workloads to maintain intensity without signs/symptoms of physical distress.   Average METs  -- 2  -- 2 2     Resistance Training   Training Prescription Yes Yes  -- Yes Yes   Weight 2 2  -- 2 2   Reps 10-12 10-12  -- 10-15 10-15     Interval Training   Interval Training  -- No  -- No No     Oxygen   Oxygen Continuous Continuous  -- Continuous Continuous   Liters 3 3  -- 3 3     Treadmill   MPH 1.4 1.4  -- 1.4 1.4   Grade 0 0  -- 0 0   Minutes 15  3/3/3/3 15  3/3  -- 15  3/3/3 15  4/4/3   METs 2 2  --  -- 2     NuStep   Level 2 3  -- 4 4   Minutes 15 15  -- 15 15   METs 2 2.1  -- 2.1 2     Biostep-RELP   Level 2 2  -- 2  --   Minutes 15 15  -- 15  --   METs 2 2  -- 2  --     Home Exercise Plan   Plans to continue exercise at  --  -- Home  --  --   Frequency  --  -- Add 2 additional days to program exercise sessions.  --  --     Exercise Review   Progression  -- No  --  --  --   Row Name 06/26/16 0900             Response to Exercise   Blood Pressure (Admit) 148/72       Blood Pressure (Exercise) 178/76        Blood Pressure (Exit) 132/84       Heart Rate (Admit) 86 bpm       Heart Rate (Exercise) 127 bpm       Heart Rate (Exit) 74 bpm       Oxygen Saturation (Admit) 96 %       Oxygen Saturation (Exercise) 93 %       Oxygen Saturation (Exit) 97 %       Rating of Perceived Exertion (Exercise) 11       Perceived Dyspnea (Exercise) 3       Duration Progress to 45 minutes of aerobic exercise without signs/symptoms of physical distress       Intensity THRR unchanged         Progression   Progression Continue to progress workloads to maintain intensity without signs/symptoms of physical distress.         Resistance Training   Training Prescription Yes       Weight 3       Reps 10-15         Interval Training   Interval Training No         Oxygen   Oxygen Continuous       Liters 3         NuStep   Level 4  Minutes 15       METs 2.4         Biostep-RELP   Level 2       Minutes 15       METs 2          Exercise Comments:     Exercise Comments    Row Name 05/04/16 1109 05/13/16 1248 05/20/16 1212 05/28/16 1139 06/03/16 1158   Exercise Comments First full day of exercise!  Patient was oriented to gym and equipment including functions, settings, policies, and procedures.  Patient's individual exercise prescription and treatment plan were reviewed.  All starting workloads were established based on the results of the 6 minute walk test done at initial orientation visit.  The plan for exercise progression was also introduced and progression will be customized based on patient's performance and goals Nikiyah has had some back pain the day after exercise.  Delayed muscle soreness was discussed and staff will continue to check in with her.  Stretches and general strength should be helpful. Reviewed home exercise with pt today.  Pt plans to walk with friends for exercise.  Reviewed THR, pulse, RPE, sign and symptoms, NTG use, and when to call 911 or MD.  Also discussed weather  considerations and indoor options.  Pt voiced understanding. Baila reports her back is less painful than the first week.  She has added resistance on the NS.   Ms. Brodman was unable to complete her full exercise prescription today due to complaint of "blurred vision" on her first machine, about 5 mins after exercise began.  VS stable, but Blood Glucose was 96 (patient is not a diabetic, but stated she did not eat breakfast this morning).  Ms Crihfield was given a pack of cheese crackers and sat out of exercise until symptoms were gone.  She was able to complete her last station without complaint.  Pt was educated regarding importance of eating breakfast prior to exercise.  Will continue to monitor for progression.   [place] Name 06/11/16 1159 06/26/16 0916         Exercise Comments Shaynah is progressing well - interval training will be added to her program and she will continue to add time on the TM. Chrystian has increased her weight for strength training and is progressing well with exercise.         Exercise Goals and Review:   Exercise Goals Re-Evaluation :   Discharge Exercise Prescription (Final Exercise Prescription Changes):     Exercise Prescription Changes - 06/26/16 0900      Response to Exercise   Blood Pressure (Admit) 148/72   Blood Pressure (Exercise) 178/76   Blood Pressure (Exit) 132/84   Heart Rate (Admit) 86 bpm   Heart Rate (Exercise) 127 bpm   Heart Rate (Exit) 74 bpm   Oxygen Saturation (Admit) 96 %   Oxygen Saturation (Exercise) 93 %   Oxygen Saturation (Exit) 97 %   Rating of Perceived Exertion (Exercise) 11   Perceived Dyspnea (Exercise) 3   Duration Progress to 45 minutes of aerobic exercise without signs/symptoms of physical distress   Intensity THRR unchanged     Progression   Progression Continue to progress workloads to maintain intensity without signs/symptoms of physical distress.     Resistance Training   Training Prescription Yes   Weight 3   Reps  10-15     Interval Training   Interval Training No     Oxygen   Oxygen Continuous   Liters 3  NuStep   Level 4   Minutes 15   METs 2.4     Biostep-RELP   Level 2   Minutes 15   METs 2      Nutrition:  Target Goals: Understanding of nutrition guidelines, daily intake of sodium <1536m, cholesterol <2066m calories 30% from fat and 7% or less from saturated fats, daily to have 5 or more servings of fruits and vegetables.  Biometrics:     Pre Biometrics - 04/28/16 1227      Pre Biometrics   Height 5' 5"  (1.651 m)   Weight 152 lb 12.8 oz (69.3 kg)   Waist Circumference 36.5 inches   Hip Circumference 40.75 inches   Waist to Hip Ratio 0.9 %   BMI (Calculated) 25.5       Nutrition Therapy Plan and Nutrition Goals:   Nutrition Discharge: Rate Your Plate Scores:   Nutrition Goals Re-Evaluation:   Nutrition Goals Discharge (Final Nutrition Goals Re-Evaluation):   Psychosocial: Target Goals: Acknowledge presence or absence of significant depression and/or stress, maximize coping skills, provide positive support system. Participant is able to verbalize types and ability to use techniques and skills needed for reducing stress and depression.   Initial Review & Psychosocial Screening:     Initial Psych Review & Screening - 04/28/16 1157      Family Dynamics   Comments Ms HoZavadils dealing with the lose of her best friend who passed away last week. Her friend had been ill for some time, so she is comforted that she is not suffering anymore. Ms HoKhuntates [name] will benfit her to be more active and help her to move on from her friend's lose.her activity and help h      Quality of Life Scores:     Quality of Life - 04/28/16 1203      Quality of Life Scores   Health/Function Pre 21 %   Socioeconomic Pre 20.29 %   Psych/Spiritual Pre 21 %   Family Pre 21 %   GLOBAL Pre 20.84 %      PHQ-9: Recent Review Flowsheet Data    [facility]  04/28/2016 01/01/2016 12/12/2015 11/12/2015 10/16/2015   Decreased Interest 2 0 0 0 0   Down, Depressed, Hopeless 1 0 0 0 0   PHQ - 2 Score 3 0 0 0 0   Altered sleeping 2 - - - -   Tired, decreased energy 3 - - - -   Change in appetite 3 - - - -   Feeling bad or failure about yourself  1 - - - -   Trouble concentrating 2 - - - -   Moving slowly or fidgety/restless 0 - - - -   Suicidal thoughts 0 - - - -   PHQ-9 Score 14 - - - -   Difficult doing work/chores Somewhat difficult - - - -     Interpretation of Total Score  Total Score Depression Severity:  1-4 = Minimal depression, 5-9 = Mild depression, 10-14 = Moderate depression, 15-19 = Moderately severe depression, 20-27 = Severe depression   Psychosocial Evaluation and Intervention:     Psychosocial Evaluation - 05/04/16 1037      Psychosocial Evaluation & Interventions   Interventions Encouraged to exercise with the program and follow exercise prescription   Comments Counselor met with Ms. HoNordmeyeroday for initial psychosocial evaluation.  She is a [age]ear old female who was diagnosed with COPD in 2015.  Her support system  is limited at this time with a best friend who recently passed away and a brother who lived with Ms. Lemmie Evens recently moved out.  She has close neighbors and realizes she needs to get back involved with her local church Sunday [employer] class.  She has had multiple back surgeries and has fallen (4) times in the past several years; the last time was one month ago.  She reports both her sleep and her appetite have been poor but are improving somewhat recently.  She denies a history of depression or anxiety; however, Counselor discussed her PHQ-9 Scores of "14" indicating moderate depression with her.  She admitted since her friend passed this has been very difficult for her.  She states her stressors include just not being able to do those things she used to enjoy.  She has goals to improve her breathing and flexibility and to "quit  falling."  Counselor encouraged her to increase her social supports to improve her mood and her feeling of isolation with the recent losses.  She agreed to do so and counselor will be following with her to see if seeing a therapist or a PCP about possible medications for her mood might be warranted in the near future.     Continue Psychosocial Services  Yes  Follow on mood - possibly refer for counseling or PCP for med eval.  Recommended to increase social supports.      Psychosocial Re-Evaluation:     Psychosocial Re-Evaluation    [place] Name 06/17/16 1618             Psychosocial Re-Evaluation   Comments [place] psychosocial assessment reveals no barriers at this time to participation in Pulmonary Rehab.  She has good family and friend support that encourages Dalesha to participate in [place] and progress with Her goals.  Ciclaly concerns are monitored, but she has acknowledge that attending the program has helped to maintain quality life with improved mobility, self-care, and emotional and financial stability.  Lerae is commended for regular attendance and self-motivation to improve Her pulmonary disease management. She does have good support from her brother who is now helping find a Marine scientist for her.          Psychosocial Discharge (Final Psychosocial Re-Evaluation):     Psychosocial Re-Evaluation - 06/17/16 1618      Psychosocial Re-Evaluation   Comments [place] psychosocial assessment reveals no barriers at this time to participation in Pulmonary Rehab.  She has good family and friend support that encourages Adreonna to participate in [place] and progress with Her goals.  [place] concerns are monitored, but she has acknowledge that attending the program has helped to maintain quality life with improved mobility, self-care, and emotional and financial stability.  Fleda is commended for regular attendance and self-motivation to improve Her pulmonary disease  management. She does have good support from her brother who is now helping find a Marine scientist for her.      Education: Education Goals: Education classes will be provided on a weekly basis, covering required topics. Participant will state understanding/return demonstration of topics presented.  Learning Barriers/Preferences:     Learning Barriers/Preferences - 04/28/16 1152      Learning Barriers/Preferences   Learning Barriers None   Learning Preferences None      Education Topics: Initial Evaluation Education: - Verbal, written and demonstration of respiratory meds, RPE/PD scales, oximetry and breathing techniques. Instruction on use of nebulizers and MDIs: cleaning and proper use, rinsing mouth with steroid doses and importance  of monitoring MDI activations. Flowsheet Row Pulmonary Rehab from 06/17/2016 in [facility] Cardiac and Pulmonary Rehab  Date  04/28/16  Educator  LB  Instruction Review Code  2- meets goals/outcomes      General Nutrition Guidelines/Fats and Fiber: -Group instruction provided by verbal, written material, models and posters to present the general guidelines for heart healthy nutrition. Gives an explanation and review of dietary fats and fiber.   Controlling Sodium/Reading Food Labels: -Group verbal and written material supporting the discussion of sodium use in heart healthy nutrition. Review and explanation with models, verbal and written materials for utilization of the food label. Flowsheet Row Pulmonary Rehab from 06/17/2016 in [place] Cardiac and Pulmonary Rehab  Date  05/04/16  Educator  CR  Instruction Review Code  2- meets goals/outcomes      Exercise Physiology & Risk Factors: - Group verbal and written instruction with models to review the exercise physiology of the cardiovascular system and associated critical values. Details cardiovascular disease risk factors and the goals associated with each risk factor. Flowsheet Row Pulmonary Rehab  from 06/17/2016 in [facility] Cardiac and Pulmonary Rehab  Date  06/10/16  Educator  [facility]  Instruction Review Code  2- meets goals/outcomes      Aerobic Exercise & Resistance Training: - Gives group verbal and written discussion on the health impact of inactivity. On the components of aerobic and resistive training programs and the benefits of this training and how to safely progress through these programs.   Flexibility, Balance, General Exercise Guidelines: - Provides group verbal and written instruction on the benefits of flexibility and balance training programs. Provides general exercise guidelines with specific guidelines to those with heart or lung disease. Demonstration and skill practice provided.   Stress Management: - Provides group verbal and written instruction about the health risks of elevated stress, cause of high stress, and healthy ways to reduce stress.   Depression: - Provides group verbal and written instruction on the correlation between heart/lung disease and depressed mood, treatment options, and the stigmas associated with seeking treatment. Flowsheet Row Pulmonary Rehab from 06/17/2016 in [facility] Cardiac and Pulmonary Rehab  Date  05/11/16  Educator  [facility]  Instruction Review Code  2- meets goals/outcomes      Exercise & Equipment Safety: - Individual verbal instruction and demonstration of equipment use and safety with use of the equipment. Flowsheet Row Pulmonary Rehab from 06/17/2016 in [facility] Cardiac and Pulmonary Rehab  Date  05/04/16  Educator  [facility]  Instruction Review Code  2- meets goals/outcomes      Infection Prevention: - Provides verbal and written material to individual with discussion of infection control including proper hand washing and proper equipment cleaning during exercise session. Flowsheet Row Pulmonary Rehab from 06/17/2016 in [facility] Cardiac and Pulmonary Rehab  Date  05/04/16  Educator  [facility]  Instruction Review Code  2- meets goals/outcomes       Falls Prevention: - Provides verbal and written material to individual with discussion of falls prevention and safety. Flowsheet Row Pulmonary Rehab from 06/17/2016 in [facility] Cardiac and Pulmonary Rehab  Date  04/28/16  Educator  LB  Instruction Review Code  2- meets goals/outcomes      Diabetes: - Individual verbal and written instruction to review signs/symptoms of diabetes, desired ranges of glucose level fasting, after meals and with exercise. Advice that pre and post exercise glucose checks will be done for 3 sessions at entry of program.   Chronic Lung Diseases: - Group verbal and written instruction  to review new updates, new respiratory medications, new advancements in procedures and treatments. Provide informative websites and "800" numbers of self-education.   Lung Procedures: - Group verbal and written instruction to describe testing methods done to diagnose lung disease. Review the outcome of test results. Describe the treatment choices: Pulmonary Function Tests, ABGs and oximetry.   Energy Conservation: - Provide group verbal and written instruction for methods to conserve energy, plan and organize activities. Instruct on pacing techniques, use of adaptive equipment and posture/positioning to relieve shortness of breath.   Triggers: - Group verbal and written instruction to review types of environmental controls: home humidity, furnaces, filters, dust mite/pet prevention, HEPA vacuums. To discuss weather changes, air quality and the benefits of nasal washing. Flowsheet Row Pulmonary Rehab from 06/17/2016 in [facility] Cardiac and Pulmonary Rehab  Date  06/03/16  Educator  LB  Instruction Review Code  2- meets goals/outcomes      Exacerbations: - Group verbal and written instruction to provide: warning signs, infection symptoms, calling MD promptly, preventive modes, and value of vaccinations. Review: effective airway clearance, coughing and/or vibration techniques. Create  an Sports administrator. Flowsheet Row Pulmonary Rehab from 06/17/2016 in [facility] Cardiac and Pulmonary Rehab  Date  05/13/16  Educator  LB  Instruction Review Code  2- meets goals/outcomes      Oxygen: - Individual and group verbal and written instruction on oxygen therapy. Includes supplement oxygen, available portable oxygen systems, continuous and intermittent flow rates, oxygen safety, concentrators, and Medicare reimbursement for oxygen. Flowsheet Row Pulmonary Rehab from 06/17/2016 in [facility] Cardiac and Pulmonary Rehab  Date  04/28/16  Educator  LB  Instruction Review Code  2- meets goals/outcomes      Respiratory Medications: - Group verbal and written instruction to review medications for lung disease. Drug class, frequency, complications, importance of spacers, rinsing mouth after steroid MDI's, and proper cleaning methods for nebulizers. Flowsheet Row Pulmonary Rehab from 06/17/2016 in [facility] Cardiac and Pulmonary Rehab  Date  04/28/16  Educator  LB  Instruction Review Code  2- meets goals/outcomes      AED/CPR: - Group verbal and written instruction with the use of models to demonstrate the basic use of the AED with the basic ABC's of resuscitation.   Breathing Retraining: - Provides individuals verbal and written instruction on purpose, frequency, and proper technique of diaphragmatic breathing and pursed-lipped breathing. Applies individual practice skills. Flowsheet Row Pulmonary Rehab from 06/17/2016 in [facility] Cardiac and Pulmonary Rehab  Date  05/04/16  Educator  [facility]  Instruction Review Code  2- meets goals/outcomes      Anatomy and Physiology of the Lungs: - Group verbal and written instruction with the use of models to provide basic lung anatomy and physiology related to function, structure and complications of lung disease. Flowsheet Row Pulmonary Rehab from 06/17/2016 in [facility] Cardiac and Pulmonary Rehab  Date  05/06/16  Educator  LB  Instruction Review Code  2- meets  goals/outcomes      Heart Failure: - Group verbal and written instruction on the basics of heart failure: signs/symptoms, treatments, explanation of ejection fraction, enlarged heart and cardiomyopathy. Flowsheet Row Pulmonary Rehab from 06/17/2016 in [facility] Cardiac and Pulmonary Rehab  Date  05/22/16  Educator  SB  Instruction Review Code  2- meets goals/outcomes      Sleep Apnea: - Individual verbal and written instruction to review Obstructive Sleep Apnea. Review of risk factors, methods for diagnosing and types of masks and machines for OSA.   Anxiety: -  Provides group, verbal and written instruction on the correlation between heart/lung disease and anxiety, treatment options, and management of anxiety. Flowsheet Row Pulmonary Rehab from 06/17/2016 in [facility] Cardiac and Pulmonary Rehab  Date  06/17/16  Educator  [facility]  Instruction Review Code  2- Meets goals/outcomes      Relaxation: - Provides group, verbal and written instruction about the benefits of relaxation for patients with heart/lung disease. Also provides patients with examples of relaxation techniques.   Knowledge Questionnaire Score:     Knowledge Questionnaire Score - 04/28/16 1152      Knowledge Questionnaire Score   Pre Score 10/10       Core Components/Risk Factors/Patient Goals at Admission:     Personal Goals and Risk Factors at Admission - 04/28/16 1155      Core Components/Risk Factors/Patient Goals on Admission   Sedentary Yes   Intervention Provide advice, education, support and counseling about physical activity/exercise needs.;Develop an individualized exercise prescription for aerobic and resistive training based on initial evaluation findings, risk stratification, comorbidities and participant's personal goals.   Expected Outcomes Achievement of increased cardiorespiratory fitness and enhanced flexibility, muscular endurance and strength shown through measurements of functional capacity and  personal statement of participant.   Increase Strength and Stamina Yes   Intervention Provide advice, education, support and counseling about physical activity/exercise needs.;Develop an individualized exercise prescription for aerobic and resistive training based on initial evaluation findings, risk stratification, comorbidities and participant's personal goals.   Expected Outcomes Achievement of increased cardiorespiratory fitness and enhanced flexibility, muscular endurance and strength shown through measurements of functional capacity and personal statement of participant.   Improve shortness of breath with ADL's Yes   Intervention Provide education, individualized exercise plan and daily activity instruction to help decrease symptoms of SOB with activities of daily living.   Expected Outcomes Short Term: Achieves a reduction of symptoms when performing activities of daily living.   Develop more efficient breathing techniques such as purse lipped breathing and diaphragmatic breathing; and practicing self-pacing with activity Yes   Intervention Provide education, demonstration and support about specific breathing techniuqes utilized for more efficient breathing. Include techniques such as pursed lipped breathing, diaphragmatic breathing and self-pacing activity.   Expected Outcomes Short Term: Participant will be able to demonstrate and use breathing techniques as needed throughout daily activities.   Increase knowledge of respiratory medications and ability to use respiratory devices properly  Yes  3l/m oxygen, Advair, Proventil MDI, Spacer given   Intervention Provide education and demonstration as needed of appropriate use of medications, inhalers, and oxygen therapy.   Expected Outcomes Short Term: Achieves understanding of medications use. Understands that oxygen is a medication prescribed by physician. Demonstrates appropriate use of inhaler and oxygen therapy.   Hypertension Yes    Intervention Provide education on lifestyle modifcations including regular physical activity/exercise, weight management, moderate sodium restriction and increased consumption of fresh fruit, vegetables, and low fat dairy, alcohol moderation, and smoking cessation.;Monitor prescription use compliance.   Expected Outcomes Short Term: Continued assessment and intervention until BP is < 140/45m HG in hypertensive participants. < 130/832mHG in hypertensive participants with diabetes, heart failure or chronic kidney disease.;Long Term: Maintenance of blood pressure at goal levels.      Core Components/Risk Factors/Patient Goals Review:      Goals and Risk Factor Review    Row Name 05/04/16 1109 05/18/16 1341 06/10/16 1356 06/12/16 1222       Core Components/Risk Factors/Patient Goals Review   Personal Goals Review Develop  more efficient breathing techniques such as purse lipped breathing and diaphragmatic breathing and practicing self-pacing with activity. Increase Strength and Stamina Sedentary;Increase Strength and Stamina;Improve shortness of breath with ADL's;Increase knowledge of respiratory medications and ability to use respiratory devices properly.;Develop more efficient breathing techniques such as purse lipped breathing and diaphragmatic breathing and practicing self-pacing with activity.;Hypertension Increase Strength and Stamina;Improve shortness of breath with ADL's    Review PLB technique was reviewed with the patinet. She demonstrated understanding of this technique.  Avyanna is feeling good about exercise - it is "not nearly as hard" as when she first started.   Ms Melfi is progressing with her exercise goals and education. She attends regularly and is very interested in the education. She states she has more energy , and although Ms Gelles still has increased shortness of breath, she is learning to pace herself and use PLB. For example, she vacuums one room at a time and then rests. She has  a good understanding of her inhalers and  uses her spacer. Ms Cosner has her brother helping her find a Marine scientist. I gave her the 2017 Guide to [facility] for a reference. Her blood pressure has been acceptable and she is compliant with her BP medication. Shia is satisfied  with what she is doing in exercise.  Her BP has been good and she can do more ADLS easier since starting the program.  She has moved up in time on the TM, and is incresing her strength work to 3 lb weights.    Expected Outcomes Patient will use PLB to help control Sat's and SOB during exercise and ADL's.  Keeshia will continue to see imporvent un her overall fitness and endurance with regular participation. Continue progressing with her exercise and learning helpful information for managing her COPD. Dejae will continue to see progress with strength and stamina with continued participation in [place].       Core Components/Risk Factors/Patient Goals at Discharge (Final Review):      Goals and Risk Factor Review - 06/12/16 1222      Core Components/Risk Factors/Patient Goals Review   Personal Goals Review Increase Strength and Stamina;Improve shortness of breath with ADL's   Review Dandria is satisfied  with what she is doing in exercise.  Her BP has been good and she can do more ADLS easier since starting the program.  She has moved up in time on the TM, and is incresing her strength work to 3 lb weights.   Expected Outcomes Edra will continue to see progress with strength and stamina with continued participation in [place].      ITP Comments:     ITP Comments    Row Name 05/08/16 1058 07/08/16 1507 08/10/16 1621       ITP Comments "Know Your Numbers" education was completed today with learning goals met.  Called Ms Plourde today - last attended 06/19/16. Ms Bassford states she has been sick with bronchitis. She had an appointment with her physician 07/06/16 and was put on an antibiotic. She hopes to return  to [place] soon. Called Ms Aybar - last attended 06/19/16. Ms Beazer has been sick with bronchitis and has had some problems with bladder control. She is still planning to return to [place].        Comments: 30 Day Note Review

## 2016-08-29 DIAGNOSIS — I82409 Acute embolism and thrombosis of unspecified deep veins of unspecified lower extremity: Secondary | ICD-10-CM | POA: Diagnosis not present

## 2016-08-29 DIAGNOSIS — J449 Chronic obstructive pulmonary disease, unspecified: Secondary | ICD-10-CM | POA: Diagnosis not present

## 2016-08-29 DIAGNOSIS — I10 Essential (primary) hypertension: Secondary | ICD-10-CM | POA: Diagnosis not present

## 2016-08-29 DIAGNOSIS — J9620 Acute and chronic respiratory failure, unspecified whether with hypoxia or hypercapnia: Secondary | ICD-10-CM | POA: Diagnosis not present

## 2016-09-01 DIAGNOSIS — J9610 Chronic respiratory failure, unspecified whether with hypoxia or hypercapnia: Secondary | ICD-10-CM | POA: Diagnosis not present

## 2016-09-01 DIAGNOSIS — I10 Essential (primary) hypertension: Secondary | ICD-10-CM | POA: Diagnosis not present

## 2016-09-01 DIAGNOSIS — R0902 Hypoxemia: Secondary | ICD-10-CM | POA: Diagnosis not present

## 2016-09-01 DIAGNOSIS — J449 Chronic obstructive pulmonary disease, unspecified: Secondary | ICD-10-CM | POA: Diagnosis not present

## 2016-09-09 ENCOUNTER — Encounter: Payer: Self-pay | Admitting: Respiratory Therapy

## 2016-09-09 DIAGNOSIS — J449 Chronic obstructive pulmonary disease, unspecified: Secondary | ICD-10-CM

## 2016-09-09 NOTE — Progress Notes (Signed)
Pulmonary Individual Treatment Plan  Patient Details  Name: Bethany [name] MRN: [id number] Date of Birth: 1936/05/31 Referring Provider:     Pulmonary Rehab from 04/28/2016 in [facility] Cardiac and Pulmonary Rehab  Referring Provider  Chasnis      Initial Encounter Date:    Pulmonary Rehab from 04/28/2016 in [facility] Cardiac and Pulmonary Rehab  Date  04/28/16  Referring Provider  Chasnis      Visit Diagnosis: Chronic obstructive pulmonary disease, unspecified COPD type ([place])  Patient's Home Medications on Admission:  Current Outpatient Prescriptions:    albuterol (PROVENTIL HFA;VENTOLIN HFA) 108 (90 Base) MCG/ACT inhaler, Inhale 2 puffs into the lungs every 6 (six) hours as needed for wheezing or shortness of breath., Disp: 1 Inhaler, Rfl: 2   aspirin (ASPIRIN EC) 81 MG EC tablet, Take 81 mg by mouth daily. Swallow whole., Disp: , Rfl:    azithromycin (ZITHROMAX Z-PAK) 250 MG tablet, Take 2 tablets (500 mg) on  Day 1,  followed by 1 tablet (250 mg) once daily on Days 2 through 5. (Patient not taking: Reported on 05/28/2016), Disp: 6 each, Rfl: 0   baclofen (LIORESAL) 10 MG tablet, Take 10 mg by mouth 3 (three) times daily., Disp: , Rfl:    cetirizine (ZYRTEC) 10 MG tablet, Take 10 mg by mouth daily., Disp: , Rfl:    diazepam (VALIUM) 5 MG tablet, Take 5 mg by mouth at bedtime. Patient takes once at night Reported on 10/16/2015, Disp: , Rfl:    docusate sodium (COLACE) 100 MG capsule, Take 100 mg by mouth 2 (two) times daily as needed for mild constipation., Disp: , Rfl:    escitalopram (LEXAPRO) 5 MG tablet, Take 10 mg by mouth daily. , Disp: , Rfl:    Fluticasone-Salmeterol (ADVAIR) 250-50 MCG/DOSE AEPB, Inhale 1 puff into the lungs 2 (two) times daily., Disp: , Rfl:    gabapentin (NEURONTIN) 300 MG capsule, Take 300 mg by mouth 3 (three) times daily., Disp: , Rfl:    HYDROcodone-acetaminophen (NORCO/VICODIN) 5-325 MG tablet, Take 1 tablet by mouth 5 (five) times daily as needed for  moderate pain. Do not exceed 5 times daily, patient at least 3 day, Disp: , Rfl:    losartan (COZAAR) 50 MG tablet, Take 50 mg by mouth daily., Disp: , Rfl:    lovastatin (MEVACOR) 20 MG tablet, Take 20 mg by mouth at bedtime. Reported on 12/12/2015, Disp: , Rfl:    metoprolol succinate (TOPROL-XL) 25 MG 24 hr tablet, Take 25 mg by mouth daily., Disp: , Rfl:    montelukast (SINGULAIR) 10 MG tablet, Take 10 mg by mouth at bedtime. Reported on 12/12/2015, Disp: , Rfl:    predniSONE (STERAPRED UNI-PAK 21 TAB) 10 MG (21) TBPK tablet, Dispense steroid taper pack as directed (Patient not taking: Reported on 05/28/2016), Disp: 21 tablet, Rfl: 0   tamsulosin (FLOMAX) 0.4 MG CAPS capsule, Take 0.4 mg by mouth daily after supper., Disp: , Rfl:    vitamin B-12 (CYANOCOBALAMIN) 1000 MCG tablet, Take 1,000 mcg by mouth daily., Disp: , Rfl:   Past Medical History: Past Medical History:  Diagnosis Date   Allergy    Anxiety    Arthritis    COPD (chronic obstructive pulmonary disease) ([place])    Hyperlipidemia    Hypertension    Oxygen deficiency     Tobacco Use: History  Smoking Status   Former Smoker   Quit date: 10/06/2014  Smokeless Tobacco   Never Used    Labs: Recent Review Scientist, physiological  There is no flowsheet data to display.       ADL UCSD:     Pulmonary Assessment Scores    Row Name 04/28/16 1154         ADL UCSD   ADL Phase Entry     SOB Score total 82     Rest 1     Walk 3     Stairs 5     Bath 5     Dress 3     Shop 2        Pulmonary Function Assessment:     Pulmonary Function Assessment - 04/28/16 1152      Pulmonary Function Tests   FVC% 41 %   FEV1% 39 %   FEV1/FVC Ratio 70.49     Initial Spirometry Results   Comments Test date 04/28/16     Breath   Bilateral Breath Sounds Clear   Shortness of Breath Yes;Limiting activity;Fear of Shortness of Breath      Exercise Target Goals:    Exercise Program Goal: Individual exercise  prescription set with THRR, safety & activity barriers. Participant demonstrates ability to understand and report RPE using BORG scale, to self-measure pulse accurately, and to acknowledge the importance of the exercise prescription.  Exercise Prescription Goal: Starting with aerobic activity 30 plus minutes a day, 3 days per week for initial exercise prescription. Provide home exercise prescription and guidelines that participant acknowledges understanding prior to discharge.  Activity Barriers & Risk Stratification:     Activity Barriers & Cardiac Risk Stratification - 04/28/16 1151      Activity Barriers & Cardiac Risk Stratification   Activity Barriers Back Problems;Shortness of Breath;Deconditioning   Cardiac Risk Stratification Moderate      6 Minute Walk:     6 Minute Walk    Row Name 04/28/16 1228 06/19/16 1204       6 Minute Walk   Phase  -- Mid Program    Distance 675 feet 900 feet    Distance % Change  -- 33 %    Walk Time 5.4 minutes 6 minutes    # of Rest Breaks 3 0    MPH 1.42 1.7    METS 2.07 2.55    RPE  -- 11    Perceived Dyspnea   -- 3    VO2 Peak 5.97 8.9    Symptoms No No    Resting HR 73 bpm 86 bpm    Resting BP 128/70 148/72    Max Ex. HR 112 bpm 127 bpm    Max Ex. BP 148/84 178/76      Interval HR   Baseline HR 73 86    1 Minute HR 106 108    2 Minute HR 103 117    3 Minute HR 109 124    4 Minute HR 105 127    5 Minute HR 112 127    6 Minute HR 112 110    2 Minute Post HR 96  --    Interval Heart Rate? Yes Yes      Interval Oxygen   Interval Oxygen?  -- Yes    Baseline Oxygen Saturation %  -- 96 %    Baseline Liters of Oxygen  -- 3 L    1 Minute Oxygen Saturation %  -- 92 %    1 Minute Liters of Oxygen  -- 3 L    2 Minute Oxygen Saturation %  -- 90 %    2 Minute  Liters of Oxygen  -- 3 L    3 Minute Oxygen Saturation %  -- 90 %    3 Minute Liters of Oxygen  -- 3 L    4 Minute Oxygen Saturation %  -- 90 %    4 Minute Liters of Oxygen   -- 3 L    5 Minute Oxygen Saturation %  -- 88 %    5 Minute Liters of Oxygen  -- 3 L    6 Minute Oxygen Saturation %  -- 90 %    6 Minute Liters of Oxygen  -- 3 L    2 Minute Post Liters of Oxygen  -- 3 L      Oxygen Initial Assessment:   Oxygen Re-Evaluation:   Oxygen Discharge (Final Oxygen Re-Evaluation):   Initial Exercise Prescription:     Initial Exercise Prescription - 04/28/16 1200      Date of Initial Exercise RX and Referring Provider   Date 04/28/16   Referring Provider Chasnis     Oxygen   Oxygen Continuous   Liters 3     Treadmill   MPH 1.4   Grade 0   Minutes 15  3/3/3/3   METs 2     NuStep   Level 2   Minutes 15   METs 2     Biostep-RELP   Level 2   Minutes 15   METs 2     Prescription Details   Frequency (times per week) 3   Duration Progress to 45 minutes of aerobic exercise without signs/symptoms of physical distress     Intensity   THRR 40-80% of Max Heartrate 100-128   Ratings of Perceived Exertion 11-13   Perceived Dyspnea 0-4     Progression   Progression Continue to progress workloads to maintain intensity without signs/symptoms of physical distress.     Resistance Training   Training Prescription Yes   Weight 2   Reps 10-12      Perform Capillary Blood Glucose checks as needed.  Exercise Prescription Changes:     Exercise Prescription Changes    Row Name 05/04/16 1100 05/13/16 1200 05/20/16 1200 05/28/16 1100 06/11/16 1100     Response to Exercise   Blood Pressure (Admit) 111/58 114/60  -- 148/80 128/70   Blood Pressure (Exercise) 142/70 126/64  -- 150/74 164/84   Blood Pressure (Exit) 132/72 124/70  -- 140/64 110/64   Heart Rate (Admit) 79 bpm 64 bpm  -- 70 bpm 71 bpm   Heart Rate (Exercise) 89 bpm 108 bpm  -- 85 bpm 87 bpm   Heart Rate (Exit) 69 bpm 60 bpm  -- 69 bpm 68 bpm   Oxygen Saturation (Admit) 99 % 96 %  -- 96 % 98 %   Oxygen Saturation (Exercise) 94 % 96 %  -- 94 % 95 %   Oxygen Saturation (Exit) 99  % 97 %  -- 99 % 99 %   Rating of Perceived Exertion (Exercise) 14 13  -- 13 13   Perceived Dyspnea (Exercise) 3 4  -- 3 5   Symptoms  -- yes  -- no no   Comments  -- back pain  Pain is next day after exercise - staff will monitor  --  --  --   Duration  -- Progress to 45 minutes of aerobic exercise without signs/symptoms of physical distress  -- Progress to 45 minutes of aerobic exercise without signs/symptoms of physical distress Progress to 45 minutes of aerobic exercise  without signs/symptoms of physical distress   Intensity  -- THRR unchanged  -- THRR unchanged THRR unchanged     Progression   Progression  -- Continue to progress workloads to maintain intensity without signs/symptoms of physical distress.  -- Continue to progress workloads to maintain intensity without signs/symptoms of physical distress. Continue to progress workloads to maintain intensity without signs/symptoms of physical distress.   Average METs  -- 2  -- 2 2     Resistance Training   Training Prescription Yes Yes  -- Yes Yes   Weight 2 2  -- 2 2   Reps 10-12 10-12  -- 10-15 10-15     Interval Training   Interval Training  -- No  -- No No     Oxygen   Oxygen Continuous Continuous  -- Continuous Continuous   Liters 3 3  -- 3 3     Treadmill   MPH 1.4 1.4  -- 1.4 1.4   Grade 0 0  -- 0 0   Minutes 15  3/3/3/3 15  3/3  -- 15  3/3/3 15  4/4/3   METs 2 2  --  -- 2     NuStep   Level 2 3  -- 4 4   Minutes 15 15  -- 15 15   METs 2 2.1  -- 2.1 2     Biostep-RELP   Level 2 2  -- 2  --   Minutes 15 15  -- 15  --   METs 2 2  -- 2  --     Home Exercise Plan   Plans to continue exercise at  --  -- Home  --  --   Frequency  --  -- Add 2 additional days to program exercise sessions.  --  --     Exercise Review   Progression  -- No  --  --  --   Row Name 06/26/16 0900             Response to Exercise   Blood Pressure (Admit) 148/72       Blood Pressure (Exercise) 178/76       Blood Pressure (Exit)  132/84       Heart Rate (Admit) 86 bpm       Heart Rate (Exercise) 127 bpm       Heart Rate (Exit) 74 bpm       Oxygen Saturation (Admit) 96 %       Oxygen Saturation (Exercise) 93 %       Oxygen Saturation (Exit) 97 %       Rating of Perceived Exertion (Exercise) 11       Perceived Dyspnea (Exercise) 3       Duration Progress to 45 minutes of aerobic exercise without signs/symptoms of physical distress       Intensity THRR unchanged         Progression   Progression Continue to progress workloads to maintain intensity without signs/symptoms of physical distress.         Resistance Training   Training Prescription Yes       Weight 3       Reps 10-15         Interval Training   Interval Training No         Oxygen   Oxygen Continuous       Liters 3         NuStep   Level 4  Minutes 15       METs 2.4         Biostep-RELP   Level 2       Minutes 15       METs 2          Exercise Comments:     Exercise Comments    Row Name 05/04/16 1109 05/13/16 1248 05/20/16 1212 05/28/16 1139 06/03/16 1158   Exercise Comments First full day of exercise!  Patient was oriented to gym and equipment including functions, settings, policies, and procedures.  Patient's individual exercise prescription and treatment plan were reviewed.  All starting workloads were established based on the results of the 6 minute walk test done at initial orientation visit.  The plan for exercise progression was also introduced and progression will be customized based on patient's performance and goals Iness has had some back pain the day after exercise.  Delayed muscle soreness was discussed and staff will continue to check in with her.  Stretches and general strength should be helpful. Reviewed home exercise with pt today.  Pt plans to walk with friends for exercise.  Reviewed THR, pulse, RPE, sign and symptoms, NTG use, and when to call 911 or MD.  Also discussed weather considerations and indoor options.   Pt voiced understanding. Ta reports her back is less painful than the first week.  She has added resistance on the NS.   Ms. Falwell was unable to complete her full exercise prescription today due to complaint of "blurred vision" on her first machine, about 5 mins after exercise began.  VS stable, but Blood Glucose was 96 (patient is not a diabetic, but stated she did not eat breakfast this morning).  Ms Bruhn was given a pack of cheese crackers and sat out of exercise until symptoms were gone.  She was able to complete her last station without complaint.  Pt was educated regarding importance of eating breakfast prior to exercise.  Will continue to monitor for progression.   [place] Name 06/11/16 1159 06/26/16 0916         Exercise Comments Lorey is progressing well - interval training will be added to her program and she will continue to add time on the TM. Francisco has increased her weight for strength training and is progressing well with exercise.         Exercise Goals and Review:   Exercise Goals Re-Evaluation :   Discharge Exercise Prescription (Final Exercise Prescription Changes):     Exercise Prescription Changes - 06/26/16 0900      Response to Exercise   Blood Pressure (Admit) 148/72   Blood Pressure (Exercise) 178/76   Blood Pressure (Exit) 132/84   Heart Rate (Admit) 86 bpm   Heart Rate (Exercise) 127 bpm   Heart Rate (Exit) 74 bpm   Oxygen Saturation (Admit) 96 %   Oxygen Saturation (Exercise) 93 %   Oxygen Saturation (Exit) 97 %   Rating of Perceived Exertion (Exercise) 11   Perceived Dyspnea (Exercise) 3   Duration Progress to 45 minutes of aerobic exercise without signs/symptoms of physical distress   Intensity THRR unchanged     Progression   Progression Continue to progress workloads to maintain intensity without signs/symptoms of physical distress.     Resistance Training   Training Prescription Yes   Weight 3   Reps 10-15     Interval Training    Interval Training No     Oxygen   Oxygen Continuous   Liters 3  NuStep   Level 4   Minutes 15   METs 2.4     Biostep-RELP   Level 2   Minutes 15   METs 2      Nutrition:  Target Goals: Understanding of nutrition guidelines, daily intake of sodium <1560m, cholesterol <2090m calories 30% from fat and 7% or less from saturated fats, daily to have 5 or more servings of fruits and vegetables.  Biometrics:     Pre Biometrics - 04/28/16 1227      Pre Biometrics   Height 5' 5"  (1.651 m)   Weight 152 lb 12.8 oz (69.3 kg)   Waist Circumference 36.5 inches   Hip Circumference 40.75 inches   Waist to Hip Ratio 0.9 %   BMI (Calculated) 25.5       Nutrition Therapy Plan and Nutrition Goals:   Nutrition Discharge: Rate Your Plate Scores:   Nutrition Goals Re-Evaluation:   Nutrition Goals Discharge (Final Nutrition Goals Re-Evaluation):   Psychosocial: Target Goals: Acknowledge presence or absence of significant depression and/or stress, maximize coping skills, provide positive support system. Participant is able to verbalize types and ability to use techniques and skills needed for reducing stress and depression.   Initial Review & Psychosocial Screening:     Initial Psych Review & Screening - 04/28/16 1157      Family Dynamics   Comments Ms HoPrudens dealing with the lose of her best friend who passed away last week. Her friend had been ill for some time, so she is comforted that she is not suffering anymore. Ms HoJettertates [name] will benfit her to be more active and help her to move on from her friend's lose.her activity and help h      Quality of Life Scores:     Quality of Life - 04/28/16 1203      Quality of Life Scores   Health/Function Pre 21 %   Socioeconomic Pre 20.29 %   Psych/Spiritual Pre 21 %   Family Pre 21 %   GLOBAL Pre 20.84 %      PHQ-9: Recent Review Flowsheet Data    [facility] 04/28/2016 01/01/2016 12/12/2015  11/12/2015 10/16/2015   Decreased Interest 2 0 0 0 0   Down, Depressed, Hopeless 1 0 0 0 0   PHQ - 2 Score 3 0 0 0 0   Altered sleeping 2 - - - -   Tired, decreased energy 3 - - - -   Change in appetite 3 - - - -   Feeling bad or failure about yourself  1 - - - -   Trouble concentrating 2 - - - -   Moving slowly or fidgety/restless 0 - - - -   Suicidal thoughts 0 - - - -   PHQ-9 Score 14 - - - -   Difficult doing work/chores Somewhat difficult - - - -     Interpretation of Total Score  Total Score Depression Severity:  1-4 = Minimal depression, 5-9 = Mild depression, 10-14 = Moderate depression, 15-19 = Moderately severe depression, 20-27 = Severe depression   Psychosocial Evaluation and Intervention:     Psychosocial Evaluation - 05/04/16 1037      Psychosocial Evaluation & Interventions   Interventions Encouraged to exercise with the program and follow exercise prescription   Comments Counselor met with Ms. HoMangaloday for initial psychosocial evaluation.  She is a [age]ear old female who was diagnosed with COPD in 2015.  Her support system  is limited at this time with a best friend who recently passed away and a brother who lived with Ms. Lemmie Evens recently moved out.  She has close neighbors and realizes she needs to get back involved with her local church Sunday [employer] class.  She has had multiple back surgeries and has fallen (4) times in the past several years; the last time was one month ago.  She reports both her sleep and her appetite have been poor but are improving somewhat recently.  She denies a history of depression or anxiety; however, Counselor discussed her PHQ-9 Scores of "14" indicating moderate depression with her.  She admitted since her friend passed this has been very difficult for her.  She states her stressors include just not being able to do those things she used to enjoy.  She has goals to improve her breathing and flexibility and to "quit falling."  Counselor encouraged her  to increase her social supports to improve her mood and her feeling of isolation with the recent losses.  She agreed to do so and counselor will be following with her to see if seeing a therapist or a PCP about possible medications for her mood might be warranted in the near future.     Continue Psychosocial Services  Yes  Follow on mood - possibly refer for counseling or PCP for med eval.  Recommended to increase social supports.      Psychosocial Re-Evaluation:     Psychosocial Re-Evaluation    [place] Name 06/17/16 1618             Psychosocial Re-Evaluation   Comments [place] psychosocial assessment reveals no barriers at this time to participation in Pulmonary Rehab.  She has good family and friend support that encourages Ximena to participate in [place] and progress with Her goals.  Kayleigh concerns are monitored, but she has acknowledge that attending the program has helped to maintain quality life with improved mobility, self-care, and emotional and financial stability.  Vanya is commended for regular attendance and self-motivation to improve Her pulmonary disease management. She does have good support from her brother who is now helping find a Marine scientist for her.          Psychosocial Discharge (Final Psychosocial Re-Evaluation):     Psychosocial Re-Evaluation - 06/17/16 1618      Psychosocial Re-Evaluation   Comments [place] psychosocial assessment reveals no barriers at this time to participation in Pulmonary Rehab.  She has good family and friend support that encourages Monifah to participate in [place] and progress with Her goals.  Floy concerns are monitored, but she has acknowledge that attending the program has helped to maintain quality life with improved mobility, self-care, and emotional and financial stability.  Denissa is commended for regular attendance and self-motivation to improve Her pulmonary disease management. She does have good support from  her brother who is now helping find a Marine scientist for her.      Education: Education Goals: Education classes will be provided on a weekly basis, covering required topics. Participant will state understanding/return demonstration of topics presented.  Learning Barriers/Preferences:     Learning Barriers/Preferences - 04/28/16 1152      Learning Barriers/Preferences   Learning Barriers None   Learning Preferences None      Education Topics: Initial Evaluation Education: - Verbal, written and demonstration of respiratory meds, RPE/PD scales, oximetry and breathing techniques. Instruction on use of nebulizers and MDIs: cleaning and proper use, rinsing mouth with steroid doses and importance  of monitoring MDI activations.   Pulmonary Rehab from 06/17/2016 in [facility] Cardiac and Pulmonary Rehab  Date  04/28/16  Educator  LB  Instruction Review Code  2- meets goals/outcomes      General Nutrition Guidelines/Fats and Fiber: -Group instruction provided by verbal, written material, models and posters to present the general guidelines for heart healthy nutrition. Gives an explanation and review of dietary fats and fiber.   Controlling Sodium/Reading Food Labels: -Group verbal and written material supporting the discussion of sodium use in heart healthy nutrition. Review and explanation with models, verbal and written materials for utilization of the food label.   Pulmonary Rehab from 06/17/2016 in [facility] Cardiac and Pulmonary Rehab  Date  05/04/16  Educator  CR  Instruction Review Code  2- meets goals/outcomes      Exercise Physiology & Risk Factors: - Group verbal and written instruction with models to review the exercise physiology of the cardiovascular system and associated critical values. Details cardiovascular disease risk factors and the goals associated with each risk factor.   Pulmonary Rehab from 06/17/2016 in [facility] Cardiac and Pulmonary Rehab  Date  06/10/16  Educator   [facility]  Instruction Review Code  2- meets goals/outcomes      Aerobic Exercise & Resistance Training: - Gives group verbal and written discussion on the health impact of inactivity. On the components of aerobic and resistive training programs and the benefits of this training and how to safely progress through these programs.   Flexibility, Balance, General Exercise Guidelines: - Provides group verbal and written instruction on the benefits of flexibility and balance training programs. Provides general exercise guidelines with specific guidelines to those with heart or lung disease. Demonstration and skill practice provided.   Stress Management: - Provides group verbal and written instruction about the health risks of elevated stress, cause of high stress, and healthy ways to reduce stress.   Depression: - Provides group verbal and written instruction on the correlation between heart/lung disease and depressed mood, treatment options, and the stigmas associated with seeking treatment.   Pulmonary Rehab from 06/17/2016 in [facility] Cardiac and Pulmonary Rehab  Date  05/11/16  Educator  [facility]  Instruction Review Code  2- meets goals/outcomes      Exercise & Equipment Safety: - Individual verbal instruction and demonstration of equipment use and safety with use of the equipment.   Pulmonary Rehab from 06/17/2016 in [facility] Cardiac and Pulmonary Rehab  Date  05/04/16  Educator  [facility]  Instruction Review Code  2- meets goals/outcomes      Infection Prevention: - Provides verbal and written material to individual with discussion of infection control including proper hand washing and proper equipment cleaning during exercise session.   Pulmonary [facility]17/[facility] Pulmonary Rehab  Date  05/04/16  Educator  [facility]  Instruction Review Code  2- meets goals/outcomes      Falls Prevention: - Provides verbal and written material to individual with discussion of falls prevention and  safety.   Pulmonary Rehab from 06/17/2016 in [facility] Cardiac and Pulmonary Rehab  Date  04/28/16  Educator  LB  Instruction Review Code  2- meets goals/outcomes      Diabetes: - Individual verbal and written instruction to review signs/symptoms of diabetes, desired ranges of glucose level fasting, after meals and with exercise. Advice that pre and post exercise glucose checks will be done for 3 sessions at entry of program.   Chronic Lung Diseases: - Group verbal and written instruction  to review new updates, new respiratory medications, new advancements in procedures and treatments. Provide informative websites and "800" numbers of self-education.   Lung Procedures: - Group verbal and written instruction to describe testing methods done to diagnose lung disease. Review the outcome of test results. Describe the treatment choices: Pulmonary Function Tests, ABGs and oximetry.   Energy Conservation: - Provide group verbal and written instruction for methods to conserve energy, plan and organize activities. Instruct on pacing techniques, use of adaptive equipment and posture/positioning to relieve shortness of breath.   Triggers: - Group verbal and written instruction to review types of environmental controls: home humidity, furnaces, filters, dust mite/pet prevention, HEPA vacuums. To discuss weather changes, air quality and the benefits of nasal washing.   Pulmonary Rehab from 06/17/2016 in [facility] Cardiac and Pulmonary Rehab  Date  06/03/16  Educator  LB  Instruction Review Code  2- meets goals/outcomes      Exacerbations: - Group verbal and written instruction to provide: warning signs, infection symptoms, calling MD promptly, preventive modes, and value of vaccinations. Review: effective airway clearance, coughing and/or vibration techniques. Create an Sports administrator.   Pulmonary Rehab from 06/17/2016 in [facility] Cardiac and Pulmonary Rehab  Date  05/13/16  Educator  LB  Instruction Review  Code  2- meets goals/outcomes      Oxygen: - Individual and group verbal and written instruction on oxygen therapy. Includes supplement oxygen, available portable oxygen systems, continuous and intermittent flow rates, oxygen safety, concentrators, and Medicare reimbursement for oxygen.   Pulmonary Rehab from 06/17/2016 in [facility] Cardiac and Pulmonary Rehab  Date  04/28/16  Educator  LB  Instruction Review Code  2- meets goals/outcomes      Respiratory Medications: - Group verbal and written instruction to review medications for lung disease. Drug class, frequency, complications, importance of spacers, rinsing mouth after steroid MDI's, and proper cleaning methods for nebulizers.   Pulmonary Rehab from 06/17/2016 in [facility] Cardiac and Pulmonary Rehab  Date  04/28/16  Educator  LB  Instruction Review Code  2- meets goals/outcomes      AED/CPR: - Group verbal and written instruction with the use of models to demonstrate the basic use of the AED with the basic ABC's of resuscitation.   Breathing Retraining: - Provides individuals verbal and written instruction on purpose, frequency, and proper technique of diaphragmatic breathing and pursed-lipped breathing. Applies individual practice skills.   Pulmonary Rehab from 06/17/2016 in [facility] Cardiac and Pulmonary Rehab  Date  05/04/16  Educator  [facility]  Instruction Review Code  2- meets goals/outcomes      Anatomy and Physiology of the Lungs: - Group verbal and written instruction with the use of models to provide basic lung anatomy and physiology related to function, structure and complications of lung disease.   Pulmonary Rehab from 06/17/2016 in [facility] Cardiac and Pulmonary Rehab  Date  05/06/16  Educator  LB  Instruction Review Code  2- meets goals/outcomes      Heart Failure: - Group verbal and written instruction on the basics of heart failure: signs/symptoms, treatments, explanation of ejection fraction, enlarged heart and  cardiomyopathy.   Pulmonary Rehab from 06/17/2016 in [facility] Cardiac and Pulmonary Rehab  Date  05/22/16  Educator  SB  Instruction Review Code  2- meets goals/outcomes      Sleep Apnea: - Individual verbal and written instruction to review Obstructive Sleep Apnea. Review of risk factors, methods for diagnosing and types of masks and machines for OSA.   Anxiety: -  Provides group, verbal and written instruction on the correlation between heart/lung disease and anxiety, treatment options, and management of anxiety.   Pulmonary Rehab from 06/17/2016 in [facility] Cardiac and Pulmonary Rehab  Date  06/17/16  Educator  [facility]  Instruction Review Code  2- Meets goals/outcomes      Relaxation: - Provides group, verbal and written instruction about the benefits of relaxation for patients with heart/lung disease. Also provides patients with examples of relaxation techniques.   Knowledge Questionnaire Score:     Knowledge Questionnaire Score - 04/28/16 1152      Knowledge Questionnaire Score   Pre Score 10/10       Core Components/Risk Factors/Patient Goals at Admission:     Personal Goals and Risk Factors at Admission - 04/28/16 1155      Core Components/Risk Factors/Patient Goals on Admission   Sedentary Yes   Intervention Provide advice, education, support and counseling about physical activity/exercise needs.;Develop an individualized exercise prescription for aerobic and resistive training based on initial evaluation findings, risk stratification, comorbidities and participant's personal goals.   Expected Outcomes Achievement of increased cardiorespiratory fitness and enhanced flexibility, muscular endurance and strength shown through measurements of functional capacity and personal statement of participant.   Increase Strength and Stamina Yes   Intervention Provide advice, education, support and counseling about physical activity/exercise needs.;Develop an individualized exercise  prescription for aerobic and resistive training based on initial evaluation findings, risk stratification, comorbidities and participant's personal goals.   Expected Outcomes Achievement of increased cardiorespiratory fitness and enhanced flexibility, muscular endurance and strength shown through measurements of functional capacity and personal statement of participant.   Improve shortness of breath with ADL's Yes   Intervention Provide education, individualized exercise plan and daily activity instruction to help decrease symptoms of SOB with activities of daily living.   Expected Outcomes Short Term: Achieves a reduction of symptoms when performing activities of daily living.   Develop more efficient breathing techniques such as purse lipped breathing and diaphragmatic breathing; and practicing self-pacing with activity Yes   Intervention Provide education, demonstration and support about specific breathing techniuqes utilized for more efficient breathing. Include techniques such as pursed lipped breathing, diaphragmatic breathing and self-pacing activity.   Expected Outcomes Short Term: Participant will be able to demonstrate and use breathing techniques as needed throughout daily activities.   Increase knowledge of respiratory medications and ability to use respiratory devices properly  Yes  3l/m oxygen, Advair, Proventil MDI, Spacer given   Intervention Provide education and demonstration as needed of appropriate use of medications, inhalers, and oxygen therapy.   Expected Outcomes Short Term: Achieves understanding of medications use. Understands that oxygen is a medication prescribed by physician. Demonstrates appropriate use of inhaler and oxygen therapy.   Hypertension Yes   Intervention Provide education on lifestyle modifcations including regular physical activity/exercise, weight management, moderate sodium restriction and increased consumption of fresh fruit, vegetables, and low fat dairy,  alcohol moderation, and smoking cessation.;Monitor prescription use compliance.   Expected Outcomes Short Term: Continued assessment and intervention until BP is < 140/41m HG in hypertensive participants. < 130/873mHG in hypertensive participants with diabetes, heart failure or chronic kidney disease.;Long Term: Maintenance of blood pressure at goal levels.      Core Components/Risk Factors/Patient Goals Review:      Goals and Risk Factor Review    Row Name 05/04/16 1109 05/18/16 1341 06/10/16 1356 06/12/16 1222       Core Components/Risk Factors/Patient Goals Review   Personal Goals Review Develop  more efficient breathing techniques such as purse lipped breathing and diaphragmatic breathing and practicing self-pacing with activity. Increase Strength and Stamina Sedentary;Increase Strength and Stamina;Improve shortness of breath with ADL's;Increase knowledge of respiratory medications and ability to use respiratory devices properly.;Develop more efficient breathing techniques such as purse lipped breathing and diaphragmatic breathing and practicing self-pacing with activity.;Hypertension Increase Strength and Stamina;Improve shortness of breath with ADL's    Review PLB technique was reviewed with the patinet. She demonstrated understanding of this technique.  Chaka is feeling good about exercise - it is "not nearly as hard" as when she first started.   Ms Char is progressing with her exercise goals and education. She attends regularly and is very interested in the education. She states she has more energy , and although Ms Esh still has increased shortness of breath, she is learning to pace herself and use PLB. For example, she vacuums one room at a time and then rests. She has a good understanding of her inhalers and  uses her spacer. Ms Amesquita has her brother helping her find a Marine scientist. I gave her the 2017 Guide to [facility] for a reference. Her blood pressure has been acceptable  and she is compliant with her BP medication. Melysa is satisfied  with what she is doing in exercise.  Her BP has been good and she can do more ADLS easier since starting the program.  She has moved up in time on the TM, and is incresing her strength work to 3 lb weights.    Expected Outcomes Patient will use PLB to help control Sat's and SOB during exercise and ADL's.  Aneshia will continue to see imporvent un her overall fitness and endurance with regular participation. Continue progressing with her exercise and learning helpful information for managing her COPD. Analei will continue to see progress with strength and stamina with continued participation in [place].       Core Components/Risk Factors/Patient Goals at Discharge (Final Review):      Goals and Risk Factor Review - 06/12/16 1222      Core Components/Risk Factors/Patient Goals Review   Personal Goals Review Increase Strength and Stamina;Improve shortness of breath with ADL's   Review Anastasha is satisfied  with what she is doing in exercise.  Her BP has been good and she can do more ADLS easier since starting the program.  She has moved up in time on the TM, and is incresing her strength work to 3 lb weights.   Expected Outcomes Magaby will continue to see progress with strength and stamina with continued participation in [place].      ITP Comments:     ITP Comments    Row Name 05/08/16 1058 07/08/16 1507 08/10/16 1621 09/09/16 0729     ITP Comments "Know Your Numbers" education was completed today with learning goals met.  Called Ms Bermingham today - last attended 06/19/16. Ms Ruark states she has been sick with bronchitis. She had an appointment with her physician 07/06/16 and was put on an antibiotic. She hopes to return to [place] soon. Called Ms Hobin - last attended 06/19/16. Ms Gaulin has been sick with bronchitis and has had some problems with bladder control. She is still planning to return to [place]. Called Ms Tesch on  09/08/16 - last attended 06/19/16. She has had bronchitis and other medical problems. Ms Landry has been walking her neighborhood and doing some light yardwork. With her light portable concentrator, Ms Aydelott has more freedom with activity. At  this time, she requests discharge from [place].       Comments: Called Ms Merriott on 09/08/16 - last attended 06/19/16. She has had bronchitis and other medical problems. Ms Fulghum has been walking her neighborhood and doing some light yardwork. With her light portable concentrator, Ms Kuc has more freedom with activity. At  this time, she requests discharge from [facility]

## 2016-09-09 NOTE — Progress Notes (Signed)
Discharge Summary  Patient Details  Name: Bethany [name] MRN: [id number] Date of Birth: 1935/07/22 Referring Provider:     Pulmonary Rehab from 04/28/2016 in [facility] Cardiac and Pulmonary Rehab  Referring Provider  Chasnis       Number of Visits: 17 Reason for Discharge:  Early Exit:  Called Ms Pardoe on 09/08/16 - last attended 06/19/16. She has had bronchitis and other medical problems. Ms Kasel has been walking her neighborhood and doing some light yardwork. With her light portable concentrator, Ms Upshaw has more freedom with activity. At  this time, she requests discharge from [name]  Smoking History:  History  Smoking Status   Former Smoker   Quit date: 10/06/2014  Smokeless Tobacco   Never Used    Diagnosis:  Chronic obstructive pulmonary disease, unspecified COPD type (HCC)  ADL UCSD:     Pulmonary Assessment Scores    Row Name 04/28/16 1154         ADL UCSD   ADL Phase Entry     SOB Score total 82     Rest 1     Walk 3     Stairs 5     Bath 5     Dress 3     Shop 2        Initial Exercise Prescription:     Initial Exercise Prescription - 04/28/16 1200      Date of Initial Exercise RX and Referring Provider   Date 04/28/16   Referring Provider Chasnis     Oxygen   Oxygen Continuous   Liters 3     Treadmill   MPH 1.4   Grade 0   Minutes 15  3/3/3/3   METs 2     NuStep   Level 2   Minutes 15   METs 2     Biostep-RELP   Level 2   Minutes 15   METs 2     Prescription Details   Frequency (times per week) 3   Duration Progress to 45 minutes of aerobic exercise without signs/symptoms of physical distress     Intensity   THRR 40-80% of Max Heartrate 100-128   Ratings of Perceived Exertion 11-13   Perceived Dyspnea 0-4     Progression   Progression Continue to progress workloads to maintain intensity without signs/symptoms of physical distress.     Resistance Training   Training Prescription Yes   Weight 2   Reps 10-12       Discharge Exercise Prescription (Final Exercise Prescription Changes):     Exercise Prescription Changes - 06/26/16 0900      Response to Exercise   Blood Pressure (Admit) 148/72   Blood Pressure (Exercise) 178/76   Blood Pressure (Exit) 132/84   Heart Rate (Admit) 86 bpm   Heart Rate (Exercise) 127 bpm   Heart Rate (Exit) 74 bpm   Oxygen Saturation (Admit) 96 %   Oxygen Saturation (Exercise) 93 %   Oxygen Saturation (Exit) 97 %   Rating of Perceived Exertion (Exercise) 11   Perceived Dyspnea (Exercise) 3   Duration Progress to 45 minutes of aerobic exercise without signs/symptoms of physical distress   Intensity THRR unchanged     Progression   Progression Continue to progress workloads to maintain intensity without signs/symptoms of physical distress.     Resistance Training   Training Prescription Yes   Weight 3   Reps 10-15     Interval Training   Interval Training No  Oxygen   Oxygen Continuous   Liters 3     NuStep   Level 4   Minutes 15   METs 2.4     Biostep-RELP   Level 2   Minutes 15   METs 2      Functional Capacity:     6 Minute Walk    Row Name 04/28/16 1228 06/19/16 1204       6 Minute Walk   Phase  -- Mid Program    Distance 675 feet 900 feet    Distance % Change  -- 33 %    Walk Time 5.4 minutes 6 minutes    # of Rest Breaks 3 0    MPH 1.42 1.7    METS 2.07 2.55    RPE  -- 11    Perceived Dyspnea   -- 3    VO2 Peak 5.97 8.9    Symptoms No No    Resting HR 73 bpm 86 bpm    Resting BP 128/70 148/72    Max Ex. HR 112 bpm 127 bpm    Max Ex. BP 148/84 178/76      Interval HR   Baseline HR 73 86    1 Minute HR 106 108    2 Minute HR 103 117    3 Minute HR 109 124    4 Minute HR 105 127    5 Minute HR 112 127    6 Minute HR 112 110    2 Minute Post HR 96  --    Interval Heart Rate? Yes Yes      Interval Oxygen   Interval Oxygen?  -- Yes    Baseline Oxygen Saturation %  -- 96 %    Baseline Liters of Oxygen  -- 3 L     1 Minute Oxygen Saturation %  -- 92 %    1 Minute Liters of Oxygen  -- 3 L    2 Minute Oxygen Saturation %  -- 90 %    2 Minute Liters of Oxygen  -- 3 L    3 Minute Oxygen Saturation %  -- 90 %    3 Minute Liters of Oxygen  -- 3 L    4 Minute Oxygen Saturation %  -- 90 %    4 Minute Liters of Oxygen  -- 3 L    5 Minute Oxygen Saturation %  -- 88 %    5 Minute Liters of Oxygen  -- 3 L    6 Minute Oxygen Saturation %  -- 90 %    6 Minute Liters of Oxygen  -- 3 L    2 Minute Post Liters of Oxygen  -- 3 L       Psychological, QOL, Others - Outcomes: PHQ 2/9: Depression screen [facility] 2/9 04/28/2016 01/01/2016 12/12/2015 11/12/2015 10/16/2015  Decreased Interest 2 0 0 0 0  Down, Depressed, Hopeless 1 0 0 0 0  PHQ - 2 Score 3 0 0 0 0  Altered sleeping 2 - - - -  Tired, decreased energy 3 - - - -  Change in appetite 3 - - - -  Feeling bad or failure about yourself  1 - - - -  Trouble concentrating 2 - - - -  Moving slowly or fidgety/restless 0 - - - -  Suicidal thoughts 0 - - - -  PHQ-9 Score 14 - - - -  Difficult doing work/chores Somewhat difficult - - - -  Quality of Life:     Quality of Life - 04/28/16 1203      Quality of Life Scores   Health/Function Pre 21 %   Socioeconomic Pre 20.29 %   Psych/Spiritual Pre 21 %   Family Pre 21 %   GLOBAL Pre 20.84 %      Personal Goals: Goals established at orientation with interventions provided to work toward goal.     Personal Goals and Risk Factors at Admission - 04/28/16 1155      Core Components/Risk Factors/Patient Goals on Admission   Sedentary Yes   Intervention Provide advice, education, support and counseling about physical activity/exercise needs.;Develop an individualized exercise prescription for aerobic and resistive training based on initial evaluation findings, risk stratification, comorbidities and participant's personal goals.   Expected Outcomes Achievement of increased cardiorespiratory fitness and enhanced  flexibility, muscular endurance and strength shown through measurements of functional capacity and personal statement of participant.   Increase Strength and Stamina Yes   Intervention Provide advice, education, support and counseling about physical activity/exercise needs.;Develop an individualized exercise prescription for aerobic and resistive training based on initial evaluation findings, risk stratification, comorbidities and participant's personal goals.   Expected Outcomes Achievement of increased cardiorespiratory fitness and enhanced flexibility, muscular endurance and strength shown through measurements of functional capacity and personal statement of participant.   Improve shortness of breath with ADL's Yes   Intervention Provide education, individualized exercise plan and daily activity instruction to help decrease symptoms of SOB with activities of daily living.   Expected Outcomes Short Term: Achieves a reduction of symptoms when performing activities of daily living.   Develop more efficient breathing techniques such as purse lipped breathing and diaphragmatic breathing; and practicing self-pacing with activity Yes   Intervention Provide education, demonstration and support about specific breathing techniuqes utilized for more efficient breathing. Include techniques such as pursed lipped breathing, diaphragmatic breathing and self-pacing activity.   Expected Outcomes Short Term: Participant will be able to demonstrate and use breathing techniques as needed throughout daily activities.   Increase knowledge of respiratory medications and ability to use respiratory devices properly  Yes  3l/m oxygen, Advair, Proventil MDI, Spacer given   Intervention Provide education and demonstration as needed of appropriate use of medications, inhalers, and oxygen therapy.   Expected Outcomes Short Term: Achieves understanding of medications use. Understands that oxygen is a medication prescribed by  physician. Demonstrates appropriate use of inhaler and oxygen therapy.   Hypertension Yes   Intervention Provide education on lifestyle modifcations including regular physical activity/exercise, weight management, moderate sodium restriction and increased consumption of fresh fruit, vegetables, and low fat dairy, alcohol moderation, and smoking cessation.;Monitor prescription use compliance.   Expected Outcomes Short Term: Continued assessment and intervention until BP is < 140/61mm HG in hypertensive participants. < 130/97mm HG in hypertensive participants with diabetes, heart failure or chronic kidney disease.;Long Term: Maintenance of blood pressure at goal levels.       Personal Goals Discharge:     Goals and Risk Factor Review    Row Name 05/04/16 1109 05/18/16 1341 06/10/16 1356 06/12/16 1222       Core Components/Risk Factors/Patient Goals Review   Personal Goals Review Develop more efficient breathing techniques such as purse lipped breathing and diaphragmatic breathing and practicing self-pacing with activity. Increase Strength and Stamina Sedentary;Increase Strength and Stamina;Improve shortness of breath with ADL's;Increase knowledge of respiratory medications and ability to use respiratory devices properly.;Develop more efficient breathing techniques such as purse lipped breathing and  diaphragmatic breathing and practicing self-pacing with activity.;Hypertension Increase Strength and Stamina;Improve shortness of breath with ADL's    Review PLB technique was reviewed with the patinet. She demonstrated understanding of this technique.  Emmogene is feeling good about exercise - it is "not nearly as hard" as when she first started.   Ms Schweppe is progressing with her exercise goals and education. She attends regularly and is very interested in the education. She states she has more energy , and although Ms Hemmingway still has increased shortness of breath, she is learning to pace herself and use PLB.  For example, she vacuums one room at a time and then rests. She has a good understanding of her inhalers and  uses her spacer. Ms Coppola has her brother helping her find a Designer, jewellery. I gave her the 2017 Guide to [facility] for a reference. Her blood pressure has been acceptable and she is compliant with her BP medication. Yolinda is satisfied  with what she is doing in exercise.  Her BP has been good and she can do more ADLS easier since starting the program.  She has moved up in time on the TM, and is incresing her strength work to 3 lb weights.    Expected Outcomes Patient will use PLB to help control Sat's and SOB during exercise and ADL's.  Kamarie will continue to see imporvent un her overall fitness and endurance with regular participation. Continue progressing with her exercise and learning helpful information for managing her COPD. Imya will continue to see progress with strength and stamina with continued participation in [name].       Nutrition & Weight - Outcomes:     Pre Biometrics - 04/28/16 1227      Pre Biometrics   Height  (1.651 m)   Weight 152 lb 12.8 oz (69.3 kg)   Waist Circumference 36.5 inches   Hip Circumference 40.75 inches   Waist to Hip Ratio 0.9 %   BMI (Calculated) 25.5       Nutrition:   Nutrition Discharge:   Education Questionnaire Score:     Knowledge Questionnaire Score - 04/28/16 1152      Knowledge Questionnaire Score   Pre Score 10/10      Goals reviewed with patient; copy given to patient.

## 2016-09-16 DIAGNOSIS — M5416 Radiculopathy, lumbar region: Secondary | ICD-10-CM | POA: Diagnosis not present

## 2016-09-16 DIAGNOSIS — M5136 Other intervertebral disc degeneration, lumbar region: Secondary | ICD-10-CM | POA: Diagnosis not present

## 2016-09-29 DIAGNOSIS — I82409 Acute embolism and thrombosis of unspecified deep veins of unspecified lower extremity: Secondary | ICD-10-CM | POA: Diagnosis not present

## 2016-09-29 DIAGNOSIS — J449 Chronic obstructive pulmonary disease, unspecified: Secondary | ICD-10-CM | POA: Diagnosis not present

## 2016-09-29 DIAGNOSIS — J9620 Acute and chronic respiratory failure, unspecified whether with hypoxia or hypercapnia: Secondary | ICD-10-CM | POA: Diagnosis not present

## 2016-09-29 DIAGNOSIS — I10 Essential (primary) hypertension: Secondary | ICD-10-CM | POA: Diagnosis not present

## 2016-10-02 DIAGNOSIS — I10 Essential (primary) hypertension: Secondary | ICD-10-CM | POA: Diagnosis not present

## 2016-10-02 DIAGNOSIS — J9610 Chronic respiratory failure, unspecified whether with hypoxia or hypercapnia: Secondary | ICD-10-CM | POA: Diagnosis not present

## 2016-10-02 DIAGNOSIS — J449 Chronic obstructive pulmonary disease, unspecified: Secondary | ICD-10-CM | POA: Diagnosis not present

## 2016-10-02 DIAGNOSIS — R0902 Hypoxemia: Secondary | ICD-10-CM | POA: Diagnosis not present

## 2016-10-19 DIAGNOSIS — M1711 Unilateral primary osteoarthritis, right knee: Secondary | ICD-10-CM | POA: Diagnosis not present

## 2016-10-19 DIAGNOSIS — M5416 Radiculopathy, lumbar region: Secondary | ICD-10-CM | POA: Diagnosis not present

## 2016-10-19 DIAGNOSIS — M5136 Other intervertebral disc degeneration, lumbar region: Secondary | ICD-10-CM | POA: Diagnosis not present

## 2016-10-23 DIAGNOSIS — M5416 Radiculopathy, lumbar region: Secondary | ICD-10-CM | POA: Diagnosis not present

## 2016-10-23 DIAGNOSIS — M5136 Other intervertebral disc degeneration, lumbar region: Secondary | ICD-10-CM | POA: Diagnosis not present

## 2016-10-28 ENCOUNTER — Ambulatory Visit: Payer: Commercial Managed Care - HMO | Admitting: Physical Therapy

## 2016-10-29 DIAGNOSIS — I82409 Acute embolism and thrombosis of unspecified deep veins of unspecified lower extremity: Secondary | ICD-10-CM | POA: Diagnosis not present

## 2016-10-29 DIAGNOSIS — J9620 Acute and chronic respiratory failure, unspecified whether with hypoxia or hypercapnia: Secondary | ICD-10-CM | POA: Diagnosis not present

## 2016-10-29 DIAGNOSIS — I10 Essential (primary) hypertension: Secondary | ICD-10-CM | POA: Diagnosis not present

## 2016-10-29 DIAGNOSIS — J449 Chronic obstructive pulmonary disease, unspecified: Secondary | ICD-10-CM | POA: Diagnosis not present

## 2016-11-01 DIAGNOSIS — J449 Chronic obstructive pulmonary disease, unspecified: Secondary | ICD-10-CM | POA: Diagnosis not present

## 2016-11-01 DIAGNOSIS — R0902 Hypoxemia: Secondary | ICD-10-CM | POA: Diagnosis not present

## 2016-11-01 DIAGNOSIS — J9610 Chronic respiratory failure, unspecified whether with hypoxia or hypercapnia: Secondary | ICD-10-CM | POA: Diagnosis not present

## 2016-11-01 DIAGNOSIS — I10 Essential (primary) hypertension: Secondary | ICD-10-CM | POA: Diagnosis not present

## 2016-11-04 ENCOUNTER — Ambulatory Visit: Payer: Commercial Managed Care - HMO | Attending: Physical Medicine and Rehabilitation | Admitting: Physical Therapy

## 2016-11-04 DIAGNOSIS — M2560 Stiffness of unspecified joint, not elsewhere classified: Secondary | ICD-10-CM

## 2016-11-04 DIAGNOSIS — G8929 Other chronic pain: Secondary | ICD-10-CM

## 2016-11-04 DIAGNOSIS — R262 Difficulty in walking, not elsewhere classified: Secondary | ICD-10-CM

## 2016-11-04 DIAGNOSIS — M5442 Lumbago with sciatica, left side: Secondary | ICD-10-CM | POA: Diagnosis not present

## 2016-11-04 DIAGNOSIS — M6281 Muscle weakness (generalized): Secondary | ICD-10-CM | POA: Diagnosis not present

## 2016-11-05 NOTE — Therapy (Addendum)
[facility] [address] [place], [place], [address] Phone: [phone]   Fax:  [phone]  Physical Therapy Evaluation  Patient Details  Name: Bethany [name] MRN: [id number] Date of Birth: December 22, 1935 Referring Provider: Dr. Yves Dill  Encounter Date: 11/04/2016        PT End of Session - 11/05/16 1802    Visit Number 1   Number of Visits 8   Date for PT Re-Evaluation 12/02/16   Authorization - Visit Number 1   Authorization - Number of Visits 10   PT Start Time 1441   PT Stop Time 1539   PT Time Calculation (min) 58 min   Activity Tolerance Patient tolerated treatment well   Behavior During Therapy [facility] for tasks assessed/performed      Past Medical History:  Diagnosis Date  . Allergy   . Anxiety   . Arthritis   . COPD (chronic obstructive pulmonary disease) (HCC)   . Hyperlipidemia   . Hypertension   . Oxygen deficiency     Past Surgical History:  Procedure Laterality Date  . ABDOMINAL HYSTERECTOMY    . SPINE SURGERY     Back surgery x8    There were no vitals filed for this visit.    Pt. reports 4/10 low back pain currently, 0/10 back pain while lying flat in bed on back and >6/10 with increase activity.  Pt. states she can't walk far because her back starts to hurt.  Pt. reports L foot feels numb.  Hydrocodone 3x/day and no steroids.     See HEP/ Core stability ex. (page #1-2).  Nustep L5 10 min.  (pre-tx: O2 99% and HR 69 bpm), (post-tx: O2 95% and HR 78 bpm).     Pt. is a pleasant 81 y/o female with chronic h/o low back pain and s/p 5 lumbar surgeries.  Pt. reports 4/10 low back pain currently at rest, 0/10 in supine position and >8/10 with increase activity.  Pt. presents with 50% limitation with lumbar flexion/ extension/ rotn.  B LE muscle strength grossly 5/5 MMT except hip flexion 4-/5 MMT (increase back pain with resisted hip flexion).  (+) L lumbar/ SI tenderness and trigger points noted.  Significant  lumbar hypomobility (multi-level fusion).  Pt. ambulates short distances with ues of O2 and slight antagic gait pattern.  No LOB in clinic but increase c/o back pain with prolonged standing/walking tasks during evaluaiton.  Pt. will benefit from short-term skilled PT services to develop ex. program/ increase strength to improve pain-free mobility/ referral to [employer] ex. program.          PT Education - 11/10/16 [phone]    Education provided Yes   Education Details Core stability ex. program.   Person(s) Educated Patient   Methods Explanation;Demonstration;Handout   Comprehension Verbalized understanding;Returned demonstration;Tactile cues required             PT Long Term Goals - 11/05/16 0831      PT LONG TERM GOAL #1   Title Pt. independent with HEP to increase B hip flexor strength to 5/5 MMT to improve walking endurance.     Baseline B LE muscle strength grossly 5/5 MMT except hip flexion 4-/5 MMT.     Time 4   Period Weeks   Status New     PT LONG TERM GOAL #2   Title Pt. able to ambulate 10 minutes at a consistent cadence with use of 2L O2 and no rest breaks to progress  to walking program in neighborhood.     Baseline Limited walking endurance/ increase c/o LBP limited walking.     Time 4   Period Weeks   Status New     PT LONG TERM GOAL #3   Title Pt. will decrease LEFS to <20% to improve pain-free functional mobility with everyday functional tasks.    Baseline LEFS: 34% on 11/04/16   Time 4   Period Weeks   Status New     PT LONG TERM GOAL #4   Title Pt. able to progress to [employer] ex. program 2day/week with no increase c/o back pain or limitations.     Baseline currently not exercising.     Time 4   Period Weeks   Status New        Patient will benefit from skilled therapeutic intervention in order to improve the following deficits and impairments:  Improper body mechanics, Pain, Postural dysfunction, Decreased mobility, Decreased activity  tolerance, Decreased endurance, Decreased range of motion, Decreased strength, Hypomobility, Impaired flexibility, Difficulty walking, Decreased balance  Visit Diagnosis: Chronic left-sided low back pain with left-sided sciatica  Muscle weakness (generalized)  Joint stiffness  Difficulty in walking, not elsewhere classified     Problem List Patient Active Problem List   Diagnosis Date Noted  . Back pain, chronic 09/13/2015  . Degenerative arthritis of lumbar spine 06/13/2015  . Low serum cobalamin 03/21/2015  . Anxiety 09/25/2014  . Chronic obstructive pulmonary disease (HCC) 11/13/2013  . BP (high blood pressure) 11/13/2013  . HLD (hyperlipidemia) 11/13/2013   Cammie McgeeMichael C Valita Righter, PT, DPT # [id number] 11/10/2016, 8:35 AM  [facility] [address] [place], [place], [address] Phone: [phone]   Fax:  [phone]  Name: Heloise OchoaShirley J Hobin MRN: [id number] Date of Birth: 05/09/1936

## 2016-11-10 ENCOUNTER — Ambulatory Visit: Payer: Commercial Managed Care - HMO | Attending: Physical Medicine and Rehabilitation | Admitting: Physical Therapy

## 2016-11-10 ENCOUNTER — Encounter: Payer: Self-pay | Admitting: Physical Therapy

## 2016-11-10 DIAGNOSIS — G8929 Other chronic pain: Secondary | ICD-10-CM | POA: Insufficient documentation

## 2016-11-10 DIAGNOSIS — M2560 Stiffness of unspecified joint, not elsewhere classified: Secondary | ICD-10-CM

## 2016-11-10 DIAGNOSIS — Z9181 History of falling: Secondary | ICD-10-CM | POA: Insufficient documentation

## 2016-11-10 DIAGNOSIS — M6281 Muscle weakness (generalized): Secondary | ICD-10-CM | POA: Diagnosis not present

## 2016-11-10 DIAGNOSIS — R262 Difficulty in walking, not elsewhere classified: Secondary | ICD-10-CM | POA: Diagnosis not present

## 2016-11-10 DIAGNOSIS — R296 Repeated falls: Secondary | ICD-10-CM | POA: Diagnosis not present

## 2016-11-10 DIAGNOSIS — M5442 Lumbago with sciatica, left side: Secondary | ICD-10-CM | POA: Insufficient documentation
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
## 2016-11-10 NOTE — Addendum Note (Signed)
Addended by: Cammie McgeeSHERK, Paz Winsett C on: 11/10/2016 08:39 AM   Modules accepted: Orders

## 2016-11-12 ENCOUNTER — Encounter: Payer: Commercial Managed Care - HMO | Admitting: Physical Therapy

## 2016-11-12 ENCOUNTER — Ambulatory Visit: Payer: Commercial Managed Care - HMO | Admitting: Physical Therapy

## 2016-11-12 DIAGNOSIS — M2560 Stiffness of unspecified joint, not elsewhere classified: Secondary | ICD-10-CM

## 2016-11-12 DIAGNOSIS — R296 Repeated falls: Secondary | ICD-10-CM | POA: Diagnosis not present

## 2016-11-12 DIAGNOSIS — M5442 Lumbago with sciatica, left side: Principal | ICD-10-CM

## 2016-11-12 DIAGNOSIS — G8929 Other chronic pain: Secondary | ICD-10-CM | POA: Diagnosis not present

## 2016-11-12 DIAGNOSIS — M6281 Muscle weakness (generalized): Secondary | ICD-10-CM

## 2016-11-12 DIAGNOSIS — R262 Difficulty in walking, not elsewhere classified: Secondary | ICD-10-CM

## 2016-11-12 DIAGNOSIS — Z9181 History of falling: Secondary | ICD-10-CM
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
## 2016-11-17 ENCOUNTER — Encounter: Payer: Self-pay | Admitting: Physical Therapy

## 2016-11-17 ENCOUNTER — Ambulatory Visit: Payer: Commercial Managed Care - HMO | Admitting: Physical Therapy

## 2016-11-17 DIAGNOSIS — M2560 Stiffness of unspecified joint, not elsewhere classified: Secondary | ICD-10-CM | POA: Diagnosis not present

## 2016-11-17 DIAGNOSIS — M5442 Lumbago with sciatica, left side: Principal | ICD-10-CM

## 2016-11-17 DIAGNOSIS — M6281 Muscle weakness (generalized): Secondary | ICD-10-CM | POA: Diagnosis not present

## 2016-11-17 DIAGNOSIS — Z9181 History of falling: Secondary | ICD-10-CM

## 2016-11-17 DIAGNOSIS — R262 Difficulty in walking, not elsewhere classified: Secondary | ICD-10-CM

## 2016-11-17 DIAGNOSIS — I739 Peripheral vascular disease, unspecified: Secondary | ICD-10-CM | POA: Diagnosis not present

## 2016-11-17 DIAGNOSIS — Z87891 Personal history of nicotine dependence: Secondary | ICD-10-CM | POA: Diagnosis not present

## 2016-11-17 DIAGNOSIS — I10 Essential (primary) hypertension: Secondary | ICD-10-CM | POA: Diagnosis not present

## 2016-11-17 DIAGNOSIS — G8929 Other chronic pain: Secondary | ICD-10-CM | POA: Diagnosis not present

## 2016-11-17 DIAGNOSIS — R296 Repeated falls: Secondary | ICD-10-CM

## 2016-11-17 DIAGNOSIS — M5416 Radiculopathy, lumbar region: Secondary | ICD-10-CM | POA: Diagnosis not present

## 2016-11-17 DIAGNOSIS — J449 Chronic obstructive pulmonary disease, unspecified: Secondary | ICD-10-CM | POA: Diagnosis not present

## 2016-11-17 DIAGNOSIS — M150 Primary generalized (osteo)arthritis: Secondary | ICD-10-CM | POA: Diagnosis not present
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
## 2016-11-19 ENCOUNTER — Encounter: Payer: Self-pay | Admitting: Physical Therapy

## 2016-11-19 ENCOUNTER — Encounter: Payer: Commercial Managed Care - HMO | Admitting: Physical Therapy

## 2016-11-19 ENCOUNTER — Ambulatory Visit: Payer: Commercial Managed Care - HMO | Admitting: Physical Therapy

## 2016-11-19 DIAGNOSIS — M2560 Stiffness of unspecified joint, not elsewhere classified: Secondary | ICD-10-CM

## 2016-11-19 DIAGNOSIS — R262 Difficulty in walking, not elsewhere classified: Secondary | ICD-10-CM

## 2016-11-19 DIAGNOSIS — R296 Repeated falls: Secondary | ICD-10-CM | POA: Diagnosis not present

## 2016-11-19 DIAGNOSIS — Z9181 History of falling: Secondary | ICD-10-CM

## 2016-11-19 DIAGNOSIS — M5442 Lumbago with sciatica, left side: Secondary | ICD-10-CM | POA: Diagnosis not present

## 2016-11-19 DIAGNOSIS — M6281 Muscle weakness (generalized): Secondary | ICD-10-CM

## 2016-11-19 DIAGNOSIS — G8929 Other chronic pain: Secondary | ICD-10-CM
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
## 2016-11-24 ENCOUNTER — Encounter: Payer: Commercial Managed Care - HMO | Admitting: Physical Therapy

## 2016-11-24 ENCOUNTER — Ambulatory Visit: Payer: Commercial Managed Care - HMO | Admitting: Physical Therapy

## 2016-11-24 DIAGNOSIS — Z9181 History of falling: Secondary | ICD-10-CM | POA: Diagnosis not present

## 2016-11-24 DIAGNOSIS — I10 Essential (primary) hypertension: Secondary | ICD-10-CM | POA: Diagnosis not present

## 2016-11-24 DIAGNOSIS — Z0000 Encounter for general adult medical examination without abnormal findings: Secondary | ICD-10-CM | POA: Diagnosis not present

## 2016-11-24 DIAGNOSIS — R296 Repeated falls: Secondary | ICD-10-CM | POA: Diagnosis not present

## 2016-11-24 DIAGNOSIS — I739 Peripheral vascular disease, unspecified: Secondary | ICD-10-CM | POA: Diagnosis not present

## 2016-11-24 DIAGNOSIS — J432 Centrilobular emphysema: Secondary | ICD-10-CM | POA: Diagnosis not present

## 2016-11-24 DIAGNOSIS — E7800 Pure hypercholesterolemia, unspecified: Secondary | ICD-10-CM | POA: Diagnosis not present

## 2016-11-24 DIAGNOSIS — M2560 Stiffness of unspecified joint, not elsewhere classified: Secondary | ICD-10-CM | POA: Diagnosis not present

## 2016-11-24 DIAGNOSIS — D649 Anemia, unspecified: Secondary | ICD-10-CM | POA: Diagnosis not present

## 2016-11-24 DIAGNOSIS — M5442 Lumbago with sciatica, left side: Secondary | ICD-10-CM | POA: Diagnosis not present

## 2016-11-24 DIAGNOSIS — R262 Difficulty in walking, not elsewhere classified: Secondary | ICD-10-CM

## 2016-11-24 DIAGNOSIS — G8929 Other chronic pain: Secondary | ICD-10-CM | POA: Diagnosis not present

## 2016-11-24 DIAGNOSIS — M6281 Muscle weakness (generalized): Secondary | ICD-10-CM

## 2016-11-24 DIAGNOSIS — M4726 Other spondylosis with radiculopathy, lumbar region: Secondary | ICD-10-CM | POA: Diagnosis not present
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
## 2016-11-26 ENCOUNTER — Ambulatory Visit: Payer: Commercial Managed Care - HMO | Admitting: Physical Therapy

## 2016-11-26 DIAGNOSIS — M6281 Muscle weakness (generalized): Secondary | ICD-10-CM

## 2016-11-26 DIAGNOSIS — Z9181 History of falling: Secondary | ICD-10-CM

## 2016-11-26 DIAGNOSIS — G8929 Other chronic pain: Secondary | ICD-10-CM | POA: Diagnosis not present

## 2016-11-26 DIAGNOSIS — R262 Difficulty in walking, not elsewhere classified: Secondary | ICD-10-CM | POA: Diagnosis not present

## 2016-11-26 DIAGNOSIS — M5442 Lumbago with sciatica, left side: Secondary | ICD-10-CM | POA: Diagnosis not present

## 2016-11-26 DIAGNOSIS — M2560 Stiffness of unspecified joint, not elsewhere classified: Secondary | ICD-10-CM | POA: Diagnosis not present

## 2016-11-26 DIAGNOSIS — R296 Repeated falls: Secondary | ICD-10-CM | POA: Diagnosis not present
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
## 2016-11-29 DIAGNOSIS — I82409 Acute embolism and thrombosis of unspecified deep veins of unspecified lower extremity: Secondary | ICD-10-CM | POA: Diagnosis not present

## 2016-11-29 DIAGNOSIS — I10 Essential (primary) hypertension: Secondary | ICD-10-CM | POA: Diagnosis not present

## 2016-11-29 DIAGNOSIS — J449 Chronic obstructive pulmonary disease, unspecified: Secondary | ICD-10-CM | POA: Diagnosis not present

## 2016-11-29 DIAGNOSIS — J9620 Acute and chronic respiratory failure, unspecified whether with hypoxia or hypercapnia: Secondary | ICD-10-CM | POA: Diagnosis not present

## 2016-12-01 ENCOUNTER — Ambulatory Visit: Payer: Commercial Managed Care - HMO | Admitting: Physical Therapy

## 2016-12-01 DIAGNOSIS — M5442 Lumbago with sciatica, left side: Secondary | ICD-10-CM | POA: Diagnosis not present

## 2016-12-01 DIAGNOSIS — R296 Repeated falls: Secondary | ICD-10-CM | POA: Diagnosis not present

## 2016-12-01 DIAGNOSIS — M6281 Muscle weakness (generalized): Secondary | ICD-10-CM | POA: Diagnosis not present

## 2016-12-01 DIAGNOSIS — M2560 Stiffness of unspecified joint, not elsewhere classified: Secondary | ICD-10-CM

## 2016-12-01 DIAGNOSIS — G8929 Other chronic pain: Secondary | ICD-10-CM

## 2016-12-01 DIAGNOSIS — Z9181 History of falling: Secondary | ICD-10-CM

## 2016-12-01 DIAGNOSIS — R262 Difficulty in walking, not elsewhere classified: Secondary | ICD-10-CM

## 2016-12-02 ENCOUNTER — Encounter: Payer: Self-pay | Admitting: Physical Therapy

## 2016-12-02 DIAGNOSIS — J449 Chronic obstructive pulmonary disease, unspecified: Secondary | ICD-10-CM | POA: Diagnosis not present

## 2016-12-02 DIAGNOSIS — I10 Essential (primary) hypertension: Secondary | ICD-10-CM | POA: Diagnosis not present

## 2016-12-02 DIAGNOSIS — R0902 Hypoxemia: Secondary | ICD-10-CM | POA: Diagnosis not present

## 2016-12-02 DIAGNOSIS — J9610 Chronic respiratory failure, unspecified whether with hypoxia or hypercapnia: Secondary | ICD-10-CM | POA: Diagnosis not present
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
## 2016-12-03 ENCOUNTER — Ambulatory Visit: Payer: Commercial Managed Care - HMO | Admitting: Physical Therapy

## 2016-12-03 ENCOUNTER — Encounter: Payer: Self-pay | Admitting: Physical Therapy

## 2016-12-03 DIAGNOSIS — Z9181 History of falling: Secondary | ICD-10-CM | POA: Diagnosis not present

## 2016-12-03 DIAGNOSIS — M6281 Muscle weakness (generalized): Secondary | ICD-10-CM | POA: Diagnosis not present

## 2016-12-03 DIAGNOSIS — G8929 Other chronic pain: Secondary | ICD-10-CM | POA: Diagnosis not present

## 2016-12-03 DIAGNOSIS — R262 Difficulty in walking, not elsewhere classified: Secondary | ICD-10-CM

## 2016-12-03 DIAGNOSIS — M2560 Stiffness of unspecified joint, not elsewhere classified: Secondary | ICD-10-CM | POA: Diagnosis not present

## 2016-12-03 DIAGNOSIS — R296 Repeated falls: Secondary | ICD-10-CM | POA: Diagnosis not present

## 2016-12-03 DIAGNOSIS — M5442 Lumbago with sciatica, left side: Principal | ICD-10-CM
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
## 2016-12-07 ENCOUNTER — Ambulatory Visit: Payer: Commercial Managed Care - HMO | Attending: Physical Medicine and Rehabilitation | Admitting: Physical Therapy

## 2016-12-07 DIAGNOSIS — M6281 Muscle weakness (generalized): Secondary | ICD-10-CM | POA: Insufficient documentation

## 2016-12-07 DIAGNOSIS — M2560 Stiffness of unspecified joint, not elsewhere classified: Secondary | ICD-10-CM | POA: Insufficient documentation

## 2016-12-07 DIAGNOSIS — G8929 Other chronic pain: Secondary | ICD-10-CM | POA: Insufficient documentation

## 2016-12-07 DIAGNOSIS — R296 Repeated falls: Secondary | ICD-10-CM | POA: Diagnosis not present

## 2016-12-07 DIAGNOSIS — M5442 Lumbago with sciatica, left side: Secondary | ICD-10-CM | POA: Insufficient documentation

## 2016-12-07 DIAGNOSIS — R262 Difficulty in walking, not elsewhere classified: Secondary | ICD-10-CM | POA: Insufficient documentation

## 2016-12-07 DIAGNOSIS — Z9181 History of falling: Secondary | ICD-10-CM | POA: Insufficient documentation

## 2016-12-08 ENCOUNTER — Encounter: Payer: Self-pay | Admitting: Physical Therapy
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
## 2016-12-10 ENCOUNTER — Encounter: Payer: Self-pay | Admitting: Physical Therapy

## 2016-12-10 ENCOUNTER — Ambulatory Visit: Payer: Commercial Managed Care - HMO | Admitting: Physical Therapy

## 2016-12-10 DIAGNOSIS — M5442 Lumbago with sciatica, left side: Secondary | ICD-10-CM | POA: Diagnosis not present

## 2016-12-10 DIAGNOSIS — M6281 Muscle weakness (generalized): Secondary | ICD-10-CM

## 2016-12-10 DIAGNOSIS — G8929 Other chronic pain: Secondary | ICD-10-CM

## 2016-12-10 DIAGNOSIS — M2560 Stiffness of unspecified joint, not elsewhere classified: Secondary | ICD-10-CM | POA: Diagnosis not present

## 2016-12-10 DIAGNOSIS — R296 Repeated falls: Secondary | ICD-10-CM

## 2016-12-10 DIAGNOSIS — R262 Difficulty in walking, not elsewhere classified: Secondary | ICD-10-CM | POA: Diagnosis not present

## 2016-12-10 DIAGNOSIS — Z9181 History of falling: Secondary | ICD-10-CM | POA: Diagnosis not present
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
## 2016-12-15 ENCOUNTER — Ambulatory Visit: Payer: Commercial Managed Care - HMO | Admitting: Physical Therapy

## 2016-12-15 DIAGNOSIS — M5442 Lumbago with sciatica, left side: Principal | ICD-10-CM

## 2016-12-15 DIAGNOSIS — M6281 Muscle weakness (generalized): Secondary | ICD-10-CM | POA: Diagnosis not present

## 2016-12-15 DIAGNOSIS — R262 Difficulty in walking, not elsewhere classified: Secondary | ICD-10-CM | POA: Diagnosis not present

## 2016-12-15 DIAGNOSIS — Z9181 History of falling: Secondary | ICD-10-CM | POA: Diagnosis not present

## 2016-12-15 DIAGNOSIS — M2560 Stiffness of unspecified joint, not elsewhere classified: Secondary | ICD-10-CM | POA: Diagnosis not present

## 2016-12-15 DIAGNOSIS — G8929 Other chronic pain: Secondary | ICD-10-CM | POA: Diagnosis not present

## 2016-12-15 DIAGNOSIS — R296 Repeated falls: Secondary | ICD-10-CM | POA: Diagnosis not present
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
## 2016-12-17 ENCOUNTER — Ambulatory Visit: Payer: Commercial Managed Care - HMO | Admitting: Physical Therapy

## 2016-12-17 ENCOUNTER — Encounter: Payer: Self-pay | Admitting: Physical Therapy

## 2016-12-17 DIAGNOSIS — Z9181 History of falling: Secondary | ICD-10-CM

## 2016-12-17 DIAGNOSIS — R262 Difficulty in walking, not elsewhere classified: Secondary | ICD-10-CM

## 2016-12-17 DIAGNOSIS — G8929 Other chronic pain: Secondary | ICD-10-CM | POA: Diagnosis not present

## 2016-12-17 DIAGNOSIS — M6281 Muscle weakness (generalized): Secondary | ICD-10-CM

## 2016-12-17 DIAGNOSIS — M5442 Lumbago with sciatica, left side: Secondary | ICD-10-CM | POA: Diagnosis not present

## 2016-12-17 DIAGNOSIS — M2560 Stiffness of unspecified joint, not elsewhere classified: Secondary | ICD-10-CM | POA: Diagnosis not present

## 2016-12-17 DIAGNOSIS — R296 Repeated falls: Secondary | ICD-10-CM | POA: Diagnosis not present
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
## 2016-12-29 DIAGNOSIS — I82409 Acute embolism and thrombosis of unspecified deep veins of unspecified lower extremity: Secondary | ICD-10-CM | POA: Diagnosis not present

## 2016-12-29 DIAGNOSIS — J449 Chronic obstructive pulmonary disease, unspecified: Secondary | ICD-10-CM | POA: Diagnosis not present

## 2016-12-29 DIAGNOSIS — I10 Essential (primary) hypertension: Secondary | ICD-10-CM | POA: Diagnosis not present

## 2016-12-29 DIAGNOSIS — J9620 Acute and chronic respiratory failure, unspecified whether with hypoxia or hypercapnia: Secondary | ICD-10-CM | POA: Diagnosis not present

## 2016-12-30 ENCOUNTER — Other Ambulatory Visit (INDEPENDENT_AMBULATORY_CARE_PROVIDER_SITE_OTHER): Payer: Self-pay | Admitting: Vascular Surgery

## 2016-12-30 DIAGNOSIS — I779 Disorder of arteries and arterioles, unspecified: Secondary | ICD-10-CM

## 2016-12-30 DIAGNOSIS — I739 Peripheral vascular disease, unspecified: Secondary | ICD-10-CM

## 2016-12-31 ENCOUNTER — Ambulatory Visit (INDEPENDENT_AMBULATORY_CARE_PROVIDER_SITE_OTHER): Payer: Medicare HMO

## 2016-12-31 ENCOUNTER — Encounter (INDEPENDENT_AMBULATORY_CARE_PROVIDER_SITE_OTHER): Payer: Self-pay | Admitting: Vascular Surgery

## 2016-12-31 ENCOUNTER — Ambulatory Visit (INDEPENDENT_AMBULATORY_CARE_PROVIDER_SITE_OTHER): Payer: Medicare HMO | Admitting: Vascular Surgery

## 2016-12-31 VITALS — BP 159/87 | HR 63 | Resp 14 | Ht 64.5 in | Wt 153.0 lb

## 2016-12-31 DIAGNOSIS — I779 Disorder of arteries and arterioles, unspecified: Secondary | ICD-10-CM

## 2016-12-31 DIAGNOSIS — I739 Peripheral vascular disease, unspecified: Secondary | ICD-10-CM | POA: Diagnosis not present

## 2016-12-31 DIAGNOSIS — I10 Essential (primary) hypertension: Secondary | ICD-10-CM | POA: Diagnosis not present

## 2016-12-31 DIAGNOSIS — I6523 Occlusion and stenosis of bilateral carotid arteries: Secondary | ICD-10-CM

## 2016-12-31 DIAGNOSIS — E782 Mixed hyperlipidemia: Secondary | ICD-10-CM | POA: Diagnosis not present

## 2017-01-01 DIAGNOSIS — I10 Essential (primary) hypertension: Secondary | ICD-10-CM | POA: Diagnosis not present

## 2017-01-01 DIAGNOSIS — R0902 Hypoxemia: Secondary | ICD-10-CM | POA: Diagnosis not present

## 2017-01-01 DIAGNOSIS — J449 Chronic obstructive pulmonary disease, unspecified: Secondary | ICD-10-CM | POA: Diagnosis not present

## 2017-01-01 DIAGNOSIS — J9610 Chronic respiratory failure, unspecified whether with hypoxia or hypercapnia: Secondary | ICD-10-CM | POA: Diagnosis not present

## 2017-01-03 DIAGNOSIS — I6529 Occlusion and stenosis of unspecified carotid artery: Secondary | ICD-10-CM | POA: Insufficient documentation

## 2017-01-03 DIAGNOSIS — I739 Peripheral vascular disease, unspecified: Secondary | ICD-10-CM | POA: Insufficient documentation

## 2017-01-03 NOTE — Progress Notes (Signed)
MRN : [id number]  Bethany [name] is a 81 y.o. (07/21/1935) female who presents with chief complaint of  Chief Complaint  Patient presents with  . Re-evaluation    1 year carotid and ABI u/s follow up  .  History of Present Illness: The patient is seen for follow up evaluation of carotid stenosis. The carotid stenosis followed by ultrasound.   The patient denies amaurosis fugax. There is no recent history of TIA symptoms or focal motor deficits. There is no prior documented CVA.  The patient is taking enteric-coated aspirin 81 mg daily.  There is no history of migraine headaches. There is no history of seizures.  The patient has a history of coronary artery disease, no recent episodes of angina or shortness of breath. The patient denies PAD or claudication symptoms. There is a history of hyperlipidemia which is being treated with a statin.    Carotid Duplex done today shows 40-59% bilateral carotid stenosis.  No change compared to last study in 12/30/2015  No outpatient prescriptions have been marked as taking for the 12/31/16 encounter (Office Visit) with Gilda CreaseSchnier, Latina CraverGregory G, MD.    Past Medical History:  Diagnosis Date  . Allergy   . Anxiety   . Arthritis   . COPD (chronic obstructive pulmonary disease) (H[facility])   . Hyperlipidemia   . Hypertension   . Oxygen deficiency     Past Surgical History:  Procedure Laterality Date  . ABDOMINAL HYSTERECTOMY    . SPINE SURGERY     Back surgery x8    Social History Social History  Substance Use Topics  . Smoking status: Former Smoker    Quit date: 10/06/2014  . Smokeless tobacco: Never Used  . Alcohol use No    Family History Family History  Problem Relation Age of Onset  . Breast cancer Maternal Aunt     Allergies  Allergen Reactions  . Codeine Itching  . Penicillins Other (See Comments)    Patient unsure  . Sulfa Antibiotics Itching     REVIEW OF SYSTEMS (Negative unless checked)  Constitutional: [] Weight loss   [] Fever  [] Chills Cardiac: [] Chest pain   [] Chest pressure   [] Palpitations   [] Shortness of breath when laying flat   [] Shortness of breath with exertion. Vascular:  [x] Pain in legs with walking   [] Pain in legs at rest  [] History of DVT   [] Phlebitis   [] Swelling in legs   [] Varicose veins   [] Non-healing ulcers Pulmonary:   [] Uses home oxygen   [] Productive cough   [] Hemoptysis   [] Wheeze  [] COPD   [] Asthma Neurologic:  [] Dizziness   [] Seizures   [] History of stroke   [] History of TIA  [] Aphasia   [] Vissual changes   [] Weakness or numbness in arm   [] Weakness or numbness in leg Musculoskeletal:   [] Joint swelling   [x] Joint pain   [x] Low back pain Hematologic:  [] Easy bruising  [] Easy bleeding   [] Hypercoagulable state   [] Anemic Gastrointestinal:  [] Diarrhea   [] Vomiting  [] Gastroesophageal reflux/heartburn   [] Difficulty swallowing. Genitourinary:  [] Chronic kidney disease   [] Difficult urination  [] Frequent urination   [] Blood in urine Skin:  [] Rashes   [] Ulcers  Psychological:  [] History of anxiety   []  History of major depression.  Physical Examination  Vitals:   12/31/16 1451  BP: (!) 159/87  Pulse: 63  Resp: 14  Weight: 69.4 kg (153 lb)  Height: 5' 4.5" (1.638 m)   Body mass index is 25.86 kg/m. Gen: WD/WN, NAD  Head: [place]/AT, No temporalis wasting.  Ear/Nose/Throat: Hearing grossly intact, nares w/o erythema or drainage Eyes: PER, EOMI, sclera nonicteric.  Neck: Supple, no large masses.   Pulmonary:  Good air movement, no audible wheezing bilaterally, no use of accessory muscles.  Cardiac: RRR, no JVD Vascular: bilateral carotid bruits Vessel Right Left  Radial Palpable Palpable  PT Not Palpable Not Palpable  DP Not Palpable Not Palpable  Gastrointestinal: Non-distended. No guarding/no peritoneal signs.  Musculoskeletal: M/S 5/5 throughout.  No deformity or atrophy.  Neurologic: CN 2-12 intact. Symmetrical.  Speech is fluent. Motor exam as listed above. Psychiatric:  Judgment intact, Mood & affect appropriate for pt's clinical situation. Dermatologic: No rashes or ulcers noted.  No changes consistent with cellulitis. Lymph : No lichenification or skin changes of chronic lymphedema.  CBC Lab Results  Component Value Date   WBC 6.1 03/23/2016   HGB 13.0 03/23/2016   HCT 38.4 03/23/2016   MCV 92.8 03/23/2016   PLT 220 03/23/2016    BMET    Component Value Date/Time   NA 143 03/23/2016 0934   NA 140 05/01/2014 0432   K 4.1 03/23/2016 0934   K 4.2 05/01/2014 0432   CL 107 03/23/2016 0934   CL 108 (H) 05/01/2014 0432   CO2 26 03/23/2016 0934   CO2 25 05/01/2014 0432   GLUCOSE 102 (H) 03/23/2016 0934   GLUCOSE 102 (H) 05/01/2014 0432   BUN 15 03/23/2016 0934   BUN 15 05/01/2014 0432   CREATININE 0.98 03/23/2016 0934   CREATININE 1.00 05/01/2014 0432   CALCIUM 9.6 03/23/2016 0934   CALCIUM 9.0 05/01/2014 0432   GFRNONAA 54 (L) 03/23/2016 0934   GFRNONAA 57 (L) 05/01/2014 0432   GFRNONAA 37 (L) 04/25/2013 1236   GFRAA >60 03/23/2016 0934   GFRAA >60 05/01/2014 0432   GFRAA 43 (L) 04/25/2013 1236   CrCl cannot be calculated (Patient's most recent lab result is older than the maximum 21 days allowed.).  COAG No results found for: INR, PROTIME  Radiology No results found.  Assessment/Plan 1. Bilateral carotid artery stenosis Recommend:  Given the patient's asymptomatic subcritical stenosis no further invasive testing or surgery at this time.  Duplex ultrasound shows bilateral <60% stenosis bilaterally.  Continue antiplatelet therapy as prescribed Continue management of CAD, HTN and Hyperlipidemia Healthy heart diet,  encouraged exercise at least 4 times per week Follow up in 12 months with duplex ultrasound and physical exam based on <50% stenosis of the bilateral carotid artery    2. PAD (peripheral artery disease) (HCC)  Recommend:  The patient has evidence of atherosclerosis of the lower extremities with claudication.  The  patient does not voice lifestyle limiting changes at this point in time.  Noninvasive studies do not suggest clinically significant change.  No invasive studies, angiography or surgery at this time The patient should continue walking and begin a more formal exercise program.  The patient should continue antiplatelet therapy and aggressive treatment of the lipid abnormalities  No changes in the patient's medications at this time  The patient should continue wearing graduated compression socks 10-15 mmHg strength to control the mild edema.    3. Essential hypertension Continue antihypertensive medications as already ordered, these medications have been reviewed and there are no changes at this time.   4. Mixed hyperlipidemia Continue statin as ordered and reviewed, no changes at this time     Bethany DredgeGregory Janie Strothman, MD  01/03/2017 12:14 PM

## 2017-01-05 IMAGING — MG MM SCREENING BREAST TOMO BILATERAL
8 of 13 series · 8 of 29 positions shown · non-contrast
Comparison: Previous exam(s).

CLINICAL DATA: Screening.

EXAM:
DIGITAL SCREENING BILATERAL MAMMOGRAM WITH 3D TOMO WITH CAD

[L MLO (1 of 2)]
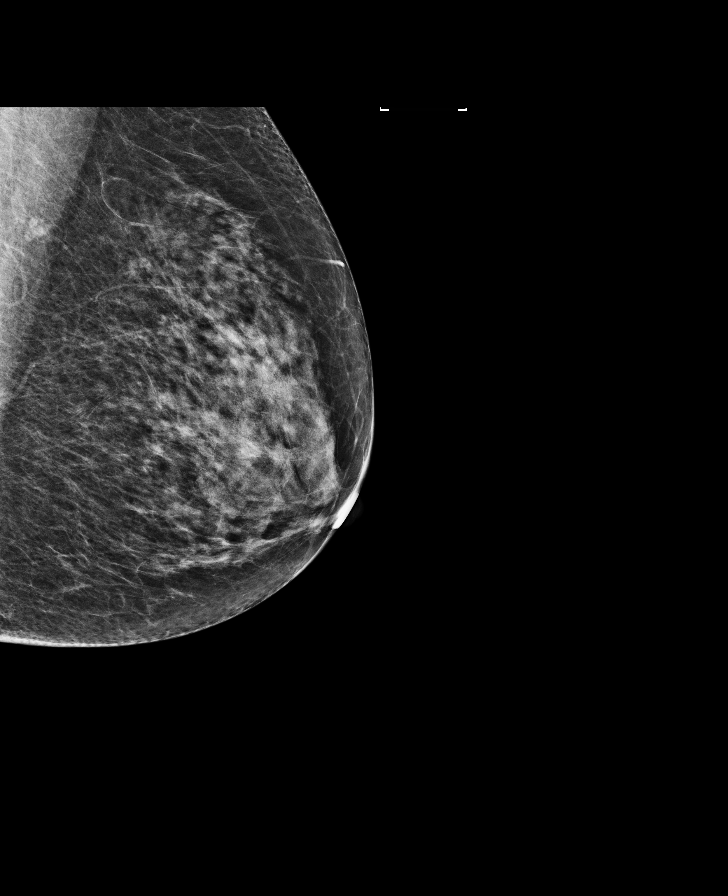

[L MLO (2 of 2)]
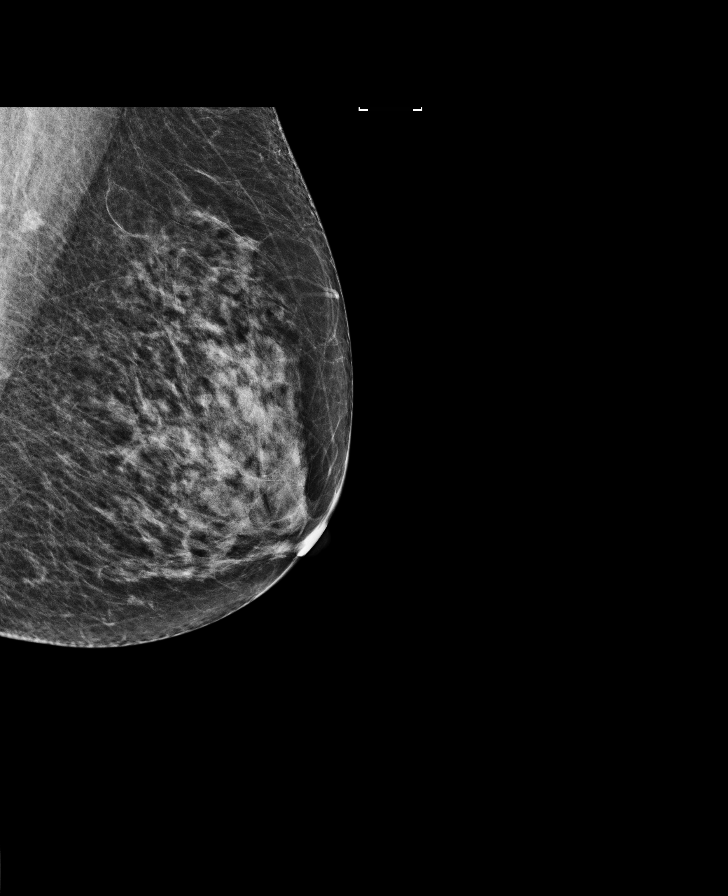

[L MLO synth-2D]
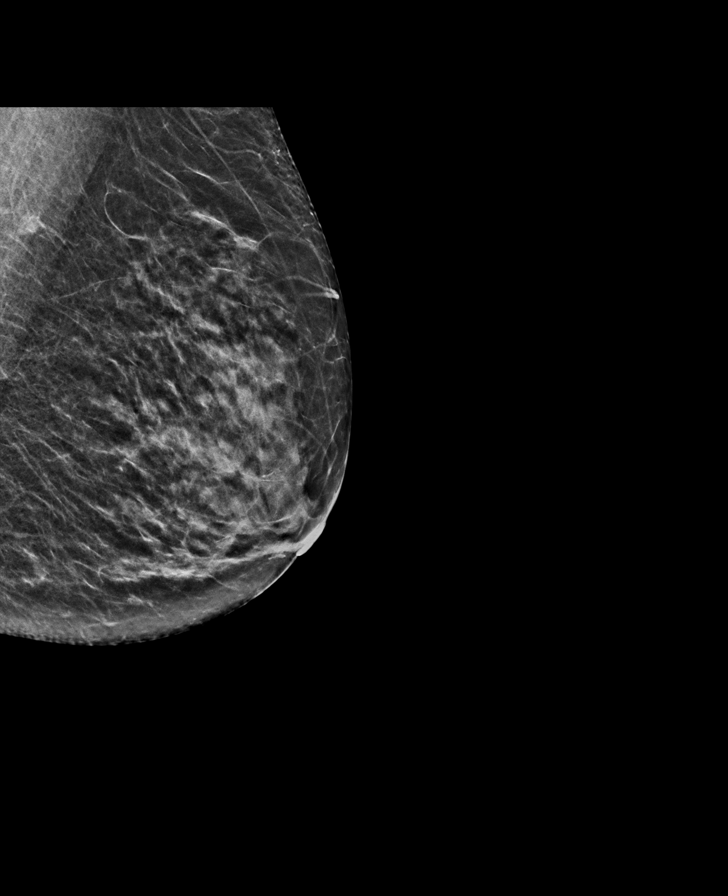

[R CC synth-2D]
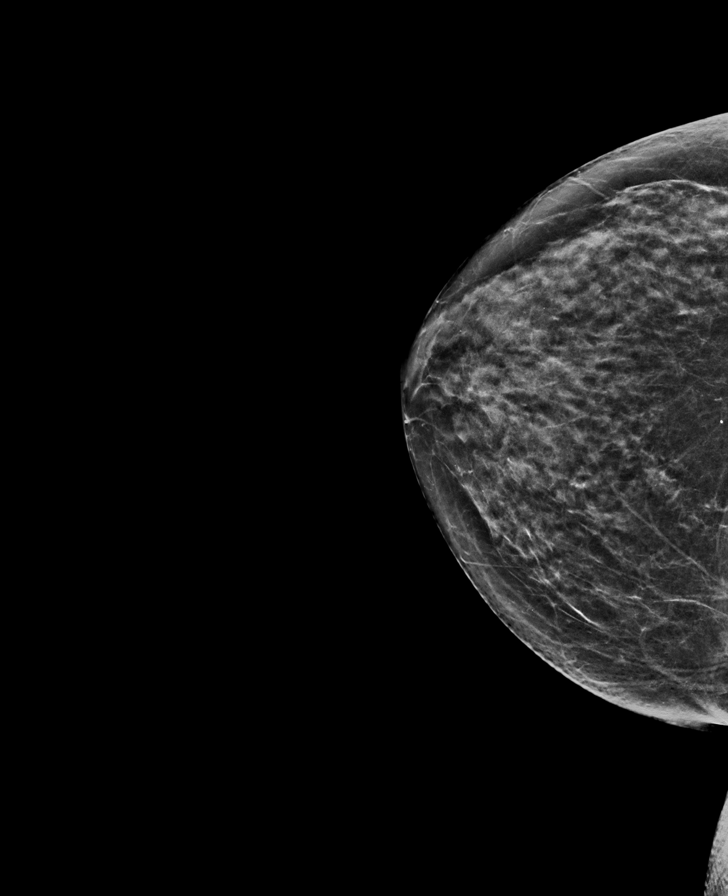

[L CC]
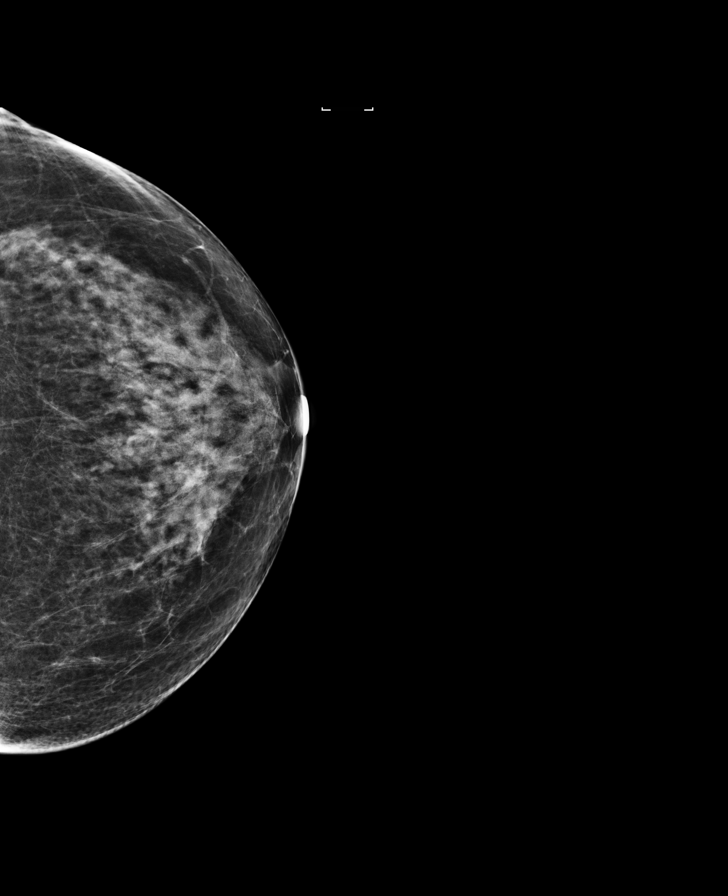

[R MLO]
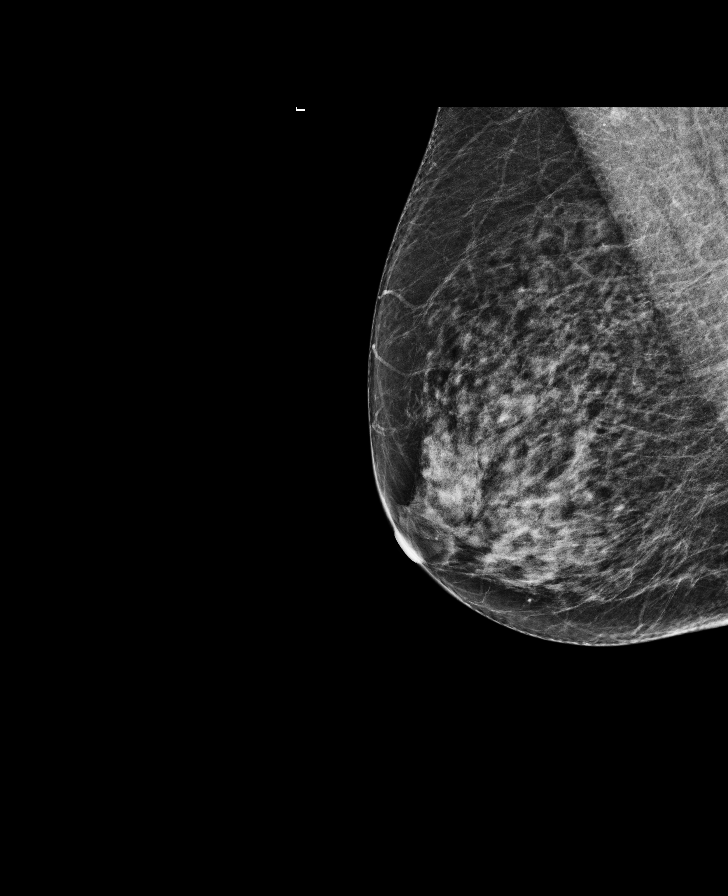

[R MLO synth-2D]
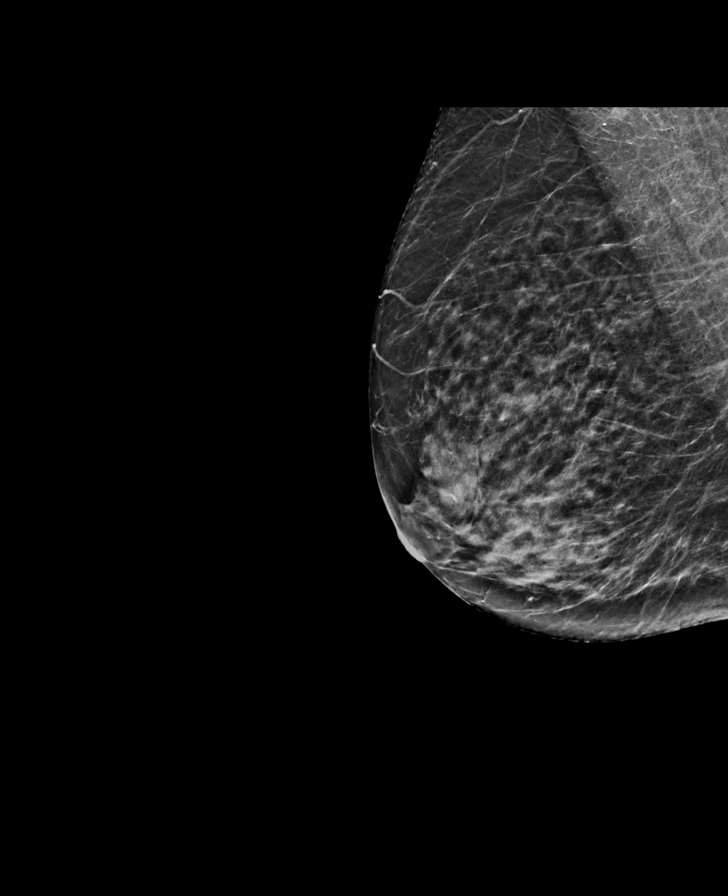

[R CC]
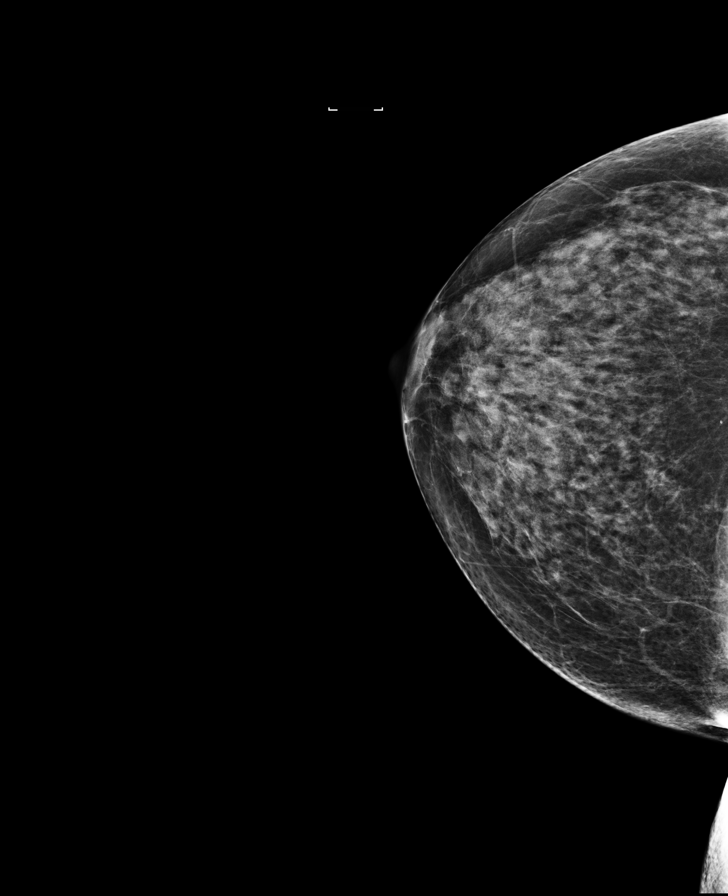

[8 of 29 positions shown; findings below may reference images not displayed]

ACR Breast Density Category c: The breast tissue is heterogeneously
dense, which may obscure small masses.
FINDINGS: There are no findings suspicious for malignancy. Images were
processed with CAD.
IMPRESSION: No mammographic evidence of malignancy. A result letter of this
screening mammogram will be mailed directly to the patient.

RECOMMENDATION:
Screening mammogram in one year. (Code:OA-G-1SS)

BI-RADS CATEGORY  1: Negative.

## 2017-01-25 DIAGNOSIS — M5416 Radiculopathy, lumbar region: Secondary | ICD-10-CM | POA: Diagnosis not present

## 2017-01-25 DIAGNOSIS — M5136 Other intervertebral disc degeneration, lumbar region: Secondary | ICD-10-CM | POA: Diagnosis not present

## 2017-01-25 DIAGNOSIS — M1711 Unilateral primary osteoarthritis, right knee: Secondary | ICD-10-CM | POA: Diagnosis not present

## 2017-01-29 DIAGNOSIS — J449 Chronic obstructive pulmonary disease, unspecified: Secondary | ICD-10-CM | POA: Diagnosis not present

## 2017-01-29 DIAGNOSIS — I82409 Acute embolism and thrombosis of unspecified deep veins of unspecified lower extremity: Secondary | ICD-10-CM | POA: Diagnosis not present

## 2017-01-29 DIAGNOSIS — I10 Essential (primary) hypertension: Secondary | ICD-10-CM | POA: Diagnosis not present

## 2017-01-29 DIAGNOSIS — J9620 Acute and chronic respiratory failure, unspecified whether with hypoxia or hypercapnia: Secondary | ICD-10-CM | POA: Diagnosis not present

## 2017-02-01 DIAGNOSIS — J9610 Chronic respiratory failure, unspecified whether with hypoxia or hypercapnia: Secondary | ICD-10-CM | POA: Diagnosis not present

## 2017-02-01 DIAGNOSIS — R0902 Hypoxemia: Secondary | ICD-10-CM | POA: Diagnosis not present

## 2017-02-01 DIAGNOSIS — J449 Chronic obstructive pulmonary disease, unspecified: Secondary | ICD-10-CM | POA: Diagnosis not present

## 2017-02-01 DIAGNOSIS — I10 Essential (primary) hypertension: Secondary | ICD-10-CM | POA: Diagnosis not present

## 2017-03-01 DIAGNOSIS — J9620 Acute and chronic respiratory failure, unspecified whether with hypoxia or hypercapnia: Secondary | ICD-10-CM | POA: Diagnosis not present

## 2017-03-01 DIAGNOSIS — J449 Chronic obstructive pulmonary disease, unspecified: Secondary | ICD-10-CM | POA: Diagnosis not present

## 2017-03-01 DIAGNOSIS — I82409 Acute embolism and thrombosis of unspecified deep veins of unspecified lower extremity: Secondary | ICD-10-CM | POA: Diagnosis not present

## 2017-03-01 DIAGNOSIS — I10 Essential (primary) hypertension: Secondary | ICD-10-CM | POA: Diagnosis not present

## 2017-03-04 DIAGNOSIS — R0902 Hypoxemia: Secondary | ICD-10-CM | POA: Diagnosis not present

## 2017-03-04 DIAGNOSIS — I10 Essential (primary) hypertension: Secondary | ICD-10-CM | POA: Diagnosis not present

## 2017-03-04 DIAGNOSIS — J9610 Chronic respiratory failure, unspecified whether with hypoxia or hypercapnia: Secondary | ICD-10-CM | POA: Diagnosis not present

## 2017-03-04 DIAGNOSIS — J449 Chronic obstructive pulmonary disease, unspecified: Secondary | ICD-10-CM | POA: Diagnosis not present

## 2017-03-22 DIAGNOSIS — J432 Centrilobular emphysema: Secondary | ICD-10-CM | POA: Diagnosis not present

## 2017-03-22 DIAGNOSIS — I10 Essential (primary) hypertension: Secondary | ICD-10-CM | POA: Diagnosis not present

## 2017-03-22 DIAGNOSIS — D649 Anemia, unspecified: Secondary | ICD-10-CM | POA: Diagnosis not present

## 2017-03-22 DIAGNOSIS — E7800 Pure hypercholesterolemia, unspecified: Secondary | ICD-10-CM | POA: Diagnosis not present

## 2017-03-22 DIAGNOSIS — E538 Deficiency of other specified B group vitamins: Secondary | ICD-10-CM | POA: Diagnosis not present

## 2017-03-22 DIAGNOSIS — I739 Peripheral vascular disease, unspecified: Secondary | ICD-10-CM | POA: Diagnosis not present

## 2017-03-22 DIAGNOSIS — M4726 Other spondylosis with radiculopathy, lumbar region: Secondary | ICD-10-CM | POA: Diagnosis not present

## 2017-03-22 DIAGNOSIS — Z0000 Encounter for general adult medical examination without abnormal findings: Secondary | ICD-10-CM | POA: Diagnosis not present

## 2017-03-26 ENCOUNTER — Other Ambulatory Visit: Payer: Self-pay | Admitting: Internal Medicine

## 2017-03-26 DIAGNOSIS — Z23 Encounter for immunization: Secondary | ICD-10-CM | POA: Diagnosis not present

## 2017-03-26 DIAGNOSIS — R911 Solitary pulmonary nodule: Secondary | ICD-10-CM | POA: Diagnosis not present

## 2017-03-26 DIAGNOSIS — I739 Peripheral vascular disease, unspecified: Secondary | ICD-10-CM | POA: Diagnosis not present

## 2017-03-26 DIAGNOSIS — D649 Anemia, unspecified: Secondary | ICD-10-CM | POA: Diagnosis not present

## 2017-03-26 DIAGNOSIS — J432 Centrilobular emphysema: Secondary | ICD-10-CM | POA: Diagnosis not present

## 2017-03-26 DIAGNOSIS — M47816 Spondylosis without myelopathy or radiculopathy, lumbar region: Secondary | ICD-10-CM | POA: Diagnosis not present

## 2017-03-26 DIAGNOSIS — I10 Essential (primary) hypertension: Secondary | ICD-10-CM | POA: Diagnosis not present

## 2017-03-31 DIAGNOSIS — J9620 Acute and chronic respiratory failure, unspecified whether with hypoxia or hypercapnia: Secondary | ICD-10-CM | POA: Diagnosis not present

## 2017-03-31 DIAGNOSIS — J449 Chronic obstructive pulmonary disease, unspecified: Secondary | ICD-10-CM | POA: Diagnosis not present

## 2017-03-31 DIAGNOSIS — I82409 Acute embolism and thrombosis of unspecified deep veins of unspecified lower extremity: Secondary | ICD-10-CM | POA: Diagnosis not present

## 2017-03-31 DIAGNOSIS — I10 Essential (primary) hypertension: Secondary | ICD-10-CM | POA: Diagnosis not present

## 2017-04-03 DIAGNOSIS — J9610 Chronic respiratory failure, unspecified whether with hypoxia or hypercapnia: Secondary | ICD-10-CM | POA: Diagnosis not present

## 2017-04-03 DIAGNOSIS — I10 Essential (primary) hypertension: Secondary | ICD-10-CM | POA: Diagnosis not present

## 2017-04-03 DIAGNOSIS — R0902 Hypoxemia: Secondary | ICD-10-CM | POA: Diagnosis not present

## 2017-04-03 DIAGNOSIS — J449 Chronic obstructive pulmonary disease, unspecified: Secondary | ICD-10-CM | POA: Diagnosis not present

## 2017-04-08 ENCOUNTER — Ambulatory Visit
Admission: RE | Admit: 2017-04-08 | Discharge: 2017-04-08 | Disposition: A | Discharge status: Home / Self Care | Payer: Medicare HMO | Source: Ambulatory Visit | Attending: Internal Medicine | Admitting: Internal Medicine

## 2017-04-08 ENCOUNTER — Other Ambulatory Visit: Payer: Self-pay | Admitting: Internal Medicine

## 2017-04-08 DIAGNOSIS — R918 Other nonspecific abnormal finding of lung field: Secondary | ICD-10-CM | POA: Diagnosis not present

## 2017-04-08 DIAGNOSIS — I2510 Atherosclerotic heart disease of native coronary artery without angina pectoris: Secondary | ICD-10-CM | POA: Diagnosis not present

## 2017-04-08 DIAGNOSIS — J432 Centrilobular emphysema: Secondary | ICD-10-CM | POA: Diagnosis not present

## 2017-04-08 DIAGNOSIS — R911 Solitary pulmonary nodule: Secondary | ICD-10-CM | POA: Insufficient documentation

## 2017-04-08 DIAGNOSIS — I700 Atherosclerosis of aorta: Secondary | ICD-10-CM | POA: Diagnosis not present

## 2017-04-08 DIAGNOSIS — Z1231 Encounter for screening mammogram for malignant neoplasm of breast: Secondary | ICD-10-CM

## 2017-04-08 MED ORDER — IOPAMIDOL (ISOVUE-300) INJECTION 61%
75.0000 mL | Freq: Once | INTRAVENOUS | Status: AC | PRN
  Administered 2017-04-08: 75 mL via INTRAVENOUS

## 2017-04-20 ENCOUNTER — Ambulatory Visit
Admission: RE | Admit: 2017-04-20 | Discharge: 2017-04-20 | Disposition: A | Discharge status: Home / Self Care | Payer: Medicare HMO | Source: Ambulatory Visit | Attending: Internal Medicine | Admitting: Internal Medicine

## 2017-04-20 DIAGNOSIS — Z1231 Encounter for screening mammogram for malignant neoplasm of breast: Secondary | ICD-10-CM | POA: Insufficient documentation

## 2017-05-01 DIAGNOSIS — I10 Essential (primary) hypertension: Secondary | ICD-10-CM | POA: Diagnosis not present

## 2017-05-01 DIAGNOSIS — I82409 Acute embolism and thrombosis of unspecified deep veins of unspecified lower extremity: Secondary | ICD-10-CM | POA: Diagnosis not present

## 2017-05-01 DIAGNOSIS — J9620 Acute and chronic respiratory failure, unspecified whether with hypoxia or hypercapnia: Secondary | ICD-10-CM | POA: Diagnosis not present

## 2017-05-01 DIAGNOSIS — J449 Chronic obstructive pulmonary disease, unspecified: Secondary | ICD-10-CM | POA: Diagnosis not present

## 2017-05-04 DIAGNOSIS — R0902 Hypoxemia: Secondary | ICD-10-CM | POA: Diagnosis not present

## 2017-05-04 DIAGNOSIS — J9610 Chronic respiratory failure, unspecified whether with hypoxia or hypercapnia: Secondary | ICD-10-CM | POA: Diagnosis not present

## 2017-05-04 DIAGNOSIS — M5136 Other intervertebral disc degeneration, lumbar region: Secondary | ICD-10-CM | POA: Diagnosis not present

## 2017-05-04 DIAGNOSIS — J449 Chronic obstructive pulmonary disease, unspecified: Secondary | ICD-10-CM | POA: Diagnosis not present

## 2017-05-04 DIAGNOSIS — M1711 Unilateral primary osteoarthritis, right knee: Secondary | ICD-10-CM | POA: Diagnosis not present

## 2017-05-04 DIAGNOSIS — I10 Essential (primary) hypertension: Secondary | ICD-10-CM | POA: Diagnosis not present

## 2017-05-04 DIAGNOSIS — M5416 Radiculopathy, lumbar region: Secondary | ICD-10-CM | POA: Diagnosis not present

## 2017-05-05 DIAGNOSIS — M5416 Radiculopathy, lumbar region: Secondary | ICD-10-CM | POA: Diagnosis not present

## 2017-05-05 DIAGNOSIS — M5136 Other intervertebral disc degeneration, lumbar region: Secondary | ICD-10-CM | POA: Diagnosis not present

## 2017-05-31 DIAGNOSIS — J9620 Acute and chronic respiratory failure, unspecified whether with hypoxia or hypercapnia: Secondary | ICD-10-CM | POA: Diagnosis not present

## 2017-05-31 DIAGNOSIS — J449 Chronic obstructive pulmonary disease, unspecified: Secondary | ICD-10-CM | POA: Diagnosis not present

## 2017-05-31 DIAGNOSIS — I82409 Acute embolism and thrombosis of unspecified deep veins of unspecified lower extremity: Secondary | ICD-10-CM | POA: Diagnosis not present

## 2017-05-31 DIAGNOSIS — I10 Essential (primary) hypertension: Secondary | ICD-10-CM | POA: Diagnosis not present

## 2017-06-03 DIAGNOSIS — J9610 Chronic respiratory failure, unspecified whether with hypoxia or hypercapnia: Secondary | ICD-10-CM | POA: Diagnosis not present

## 2017-06-03 DIAGNOSIS — J449 Chronic obstructive pulmonary disease, unspecified: Secondary | ICD-10-CM | POA: Diagnosis not present

## 2017-06-03 DIAGNOSIS — I10 Essential (primary) hypertension: Secondary | ICD-10-CM | POA: Diagnosis not present

## 2017-06-03 DIAGNOSIS — R0902 Hypoxemia: Secondary | ICD-10-CM | POA: Diagnosis not present

## 2017-06-09 ENCOUNTER — Other Ambulatory Visit: Payer: Self-pay

## 2017-06-09 ENCOUNTER — Ambulatory Visit
Admission: EM | Admit: 2017-06-09 | Discharge: 2017-06-09 | Disposition: A | Discharge status: Home / Self Care | Payer: Medicare HMO | Attending: Family Medicine | Admitting: Family Medicine

## 2017-06-09 ENCOUNTER — Encounter: Payer: Self-pay | Admitting: Emergency Medicine

## 2017-06-09 ENCOUNTER — Ambulatory Visit (INDEPENDENT_AMBULATORY_CARE_PROVIDER_SITE_OTHER): Payer: Medicare HMO

## 2017-06-09 DIAGNOSIS — M79672 Pain in left foot: Secondary | ICD-10-CM

## 2017-06-09 DIAGNOSIS — S93602A Unspecified sprain of left foot, initial encounter: Secondary | ICD-10-CM | POA: Diagnosis not present

## 2017-06-09 DIAGNOSIS — S99922A Unspecified injury of left foot, initial encounter: Secondary | ICD-10-CM

## 2017-06-09 NOTE — Discharge Instructions (Signed)
Rest, ice, elevation.  Use your home pain medication as directed.  Take care  Dr. Adriana Simasook

## 2017-06-09 NOTE — ED Provider Notes (Signed)
[facility]    CSN: [id number] Arrival date & time: 06/09/17  1532  History   Chief Complaint Chief Complaint  Patient presents with  . Foot Pain    DOI 06/09/17   HPI  82 year old female presents with foot pain.  Patient states that this morning she was getting up out of her chair to get another cup of coffee.  She somehow "twisted" her foot.  She states that she is unsure if she extended or flexed too much.  However, she has been having pain since then.  Her pain is currently 8/10 in severity.  The pain is located particularly at the lateral aspect of the left foot around the fifth metatarsal.  Worse with activity.  No relieving factors.  She reports associated swelling but this is improved.  No other associated symptoms.  No other complaints at this time.  Past Medical History:  Diagnosis Date  . Allergy   . Anxiety   . Arthritis   . COPD (chronic obstructive pulmonary disease) ([facility])   . Hyperlipidemia   . Hypertension   . Oxygen deficiency    Patient Active Problem List   Diagnosis Date Noted  . Carotid stenosis 01/03/2017  . PAD (peripheral artery disease) (H[facility]) 01/03/2017  . Back pain, chronic 09/13/2015  . Degenerative arthritis of lumbar spine 06/13/2015  . Low serum cobalamin 03/21/2015  . Anxiety 09/25/2014  . Chronic obstructive pulmonary disease ([facility]) 11/13/2013  . BP (high blood pressure) 11/13/2013  . HLD (hyperlipidemia) 11/13/2013   Past Surgical History:  Procedure Laterality Date  . ABDOMINAL HYSTERECTOMY    . SPINE SURGERY     Back surgery x8   OB History    No data available     Home Medications    Prior to Admission medications   Medication Sig Start Date End Date Taking? Authorizing Provider  albuterol (PROVENTIL HFA;VENTOLIN HFA) 108 (90 Base) MCG/ACT inhaler Inhale 2 puffs into the lungs every 6 (six) hours as needed for wheezing or shortness of breath. 03/23/16  Yes Emily Filbert, MD  aspirin (ASPIRIN EC) 81 MG EC tablet  Take 81 mg by mouth daily. Swallow whole.   Yes [provider]  baclofen (LIORESAL) 10 MG tablet Take 10 mg by mouth 3 (three) times daily.   Yes [provider]  CALCIUM CARBONATE PO Take 1 tablet by mouth daily.   Yes [provider]  cetirizine (ZYRTEC) 10 MG tablet Take 10 mg by mouth daily.   Yes [provider]  Cholecalciferol (VITAMIN D3) 1000 units CAPS Take 1 capsule by mouth daily.   Yes [provider]  diazepam (VALIUM) 5 MG tablet Take 5 mg by mouth at bedtime. Patient takes once at night Reported on 10/16/2015   Yes [provider]  docusate sodium (COLACE) 100 MG capsule Take 100 mg by mouth 2 (two) times daily as needed for mild constipation.   Yes [provider]  escitalopram (LEXAPRO) 5 MG tablet Take 10 mg by mouth daily.    Yes [provider]  Fluticasone-Salmeterol (ADVAIR) 250-50 MCG/DOSE AEPB Inhale 1 puff into the lungs 2 (two) times daily.   Yes [provider]  gabapentin (NEURONTIN) 300 MG capsule Take 300 mg by mouth 3 (three) times daily.   Yes [provider]  HYDROcodone-acetaminophen (NORCO/VICODIN) 5-325 MG tablet Take 1 tablet by mouth 5 (five) times daily as needed for moderate pain. Do not exceed 5 times daily, patient at least 3 day  Yes [provider]  losartan (COZAAR) 50 MG tablet Take 50 mg by mouth daily.   Yes [provider]  lovastatin (MEVACOR) 20 MG tablet Take 20 mg by mouth at bedtime. Reported on 12/12/2015   Yes [provider]  metoprolol succinate (TOPROL-XL) 25 MG 24 hr tablet Take 25 mg by mouth daily.   Yes [provider]  montelukast (SINGULAIR) 10 MG tablet Take 10 mg by mouth at bedtime. Reported on 12/12/2015   Yes [provider]  tamsulosin (FLOMAX) 0.4 MG CAPS capsule Take 0.4 mg by mouth daily after supper.   Yes [provider]  vitamin B-12 (CYANOCOBALAMIN) 1000 MCG tablet Take 1,000 mcg by  mouth daily.   Yes [provider]    Family History Family History  Problem Relation Age of Onset  . Breast cancer Maternal Aunt   . Heart disease Mother   . ALS Father   . Heart disease Father   . Lymphoma Brother     Social History Social History   Tobacco Use  . Smoking status: Former Smoker    Last attempt to quit: 10/06/2014    Years since quitting: 2.6  . Smokeless tobacco: Never Used  Substance Use Topics  . Alcohol use: No  . Drug use: No     Allergies   Codeine; Penicillins; and Sulfa antibiotics   Review of Systems Review of Systems  Constitutional: Negative.   Musculoskeletal:       Foot pain, swelling.   Physical Exam Triage Vital Signs ED Triage Vitals  Enc Vitals Group     BP 06/09/17 1639 (!) 164/77     Pulse Rate 06/09/17 1639 82     Resp 06/09/17 1639 16     Temp 06/09/17 1639 98.2 F (36.8 C)     Temp Source 06/09/17 1639 Oral     SpO2 06/09/17 1639 96 %     Weight 06/09/17 1639 138 lb (62.6 kg)     Height 06/09/17 1639 5\' 7"  (1.702 m)     Head Circumference --      Peak Flow --      Pain Score 06/09/17 1640 8     Pain Loc --      Pain Edu? --      Excl. in GC? --    Updated Vital Signs BP (!) 164/77 (BP Location: Left Arm)   Pulse 82   Temp 98.2 F (36.8 C) (Oral)   Resp 16   Ht 5\' 7"  (1.702 m)   Wt 138 lb (62.6 kg)   SpO2 96%   BMI 21.61 kg/m    Physical Exam  Constitutional: She is oriented to person, place, and time.  Chronically ill-appearing female no acute distress.  Cardiovascular: Normal rate and regular rhythm.  Pulmonary/Chest: No respiratory distress.  Musculoskeletal:  Left foot -mild swelling in the dorsum of the foot noted.  Tenderness palpation at the fifth metatarsal base.  Neurological: She is alert and oriented to person, place, and time.  Psychiatric: She has a normal mood and affect. Her behavior is normal.  Nursing note and vitals reviewed.  UC Treatments / Results  Labs (all labs  ordered are listed, but only abnormal results are displayed) Labs Reviewed - No data to display  EKG  EKG Interpretation None       Radiology Dg Foot Complete Left  Result Date: 06/09/2017 CLINICAL DATA:  Hyperextension injury EXAM: LEFT FOOT - COMPLETE 3+ VIEW COMPARISON:  None. FINDINGS: Frontal,  oblique, and lateral views were obtained. Postoperative changes noted in the distal first metatarsal. No acute fracture or dislocation evident. Joint spaces appear unremarkable except for narrowing of the first MTP joint. No erosive change. There are multiple foci of arterial vascular calcification. IMPRESSION: Postoperative change distal first metatarsal. Evidence of previous bunionectomy. Narrowing first MTP joint. No fracture or dislocation. Other joint spaces appear normal. Foci of arterial vascular calcification noted. Electronically Signed   By: Bretta Bang III M.D.   On: 06/09/2017 17:37    Procedures Procedures (including critical care time)  Medications Ordered in UC Medications - No data to display   Initial Impression / Assessment and Plan / UC Course  I have reviewed the triage vital signs and the nursing notes.  Pertinent labs & imaging results that were available during my care of the patient were reviewed by me and considered in my medical decision making (see chart for details).     82 year old female presents with foot pain after an injury.  X-ray negative.  Advised her to rest, elevate, and ice the area.  Continue her home pain medication as needed.  Final Clinical Impressions(s) / UC Diagnoses   Final diagnoses:  Sprain of left foot, initial encounter    ED Discharge Orders    None     Controlled Substance Prescriptions [place] Controlled Substance Registry consulted? No   Tommie Sams, [place] 06/09/17 1759

## 2017-06-09 NOTE — ED Triage Notes (Signed)
Patient in today c/o left foot pain after almost falling this morning.

## 2017-06-15 DIAGNOSIS — H35371 Puckering of macula, right eye: Secondary | ICD-10-CM | POA: Diagnosis not present

## 2017-07-01 DIAGNOSIS — J449 Chronic obstructive pulmonary disease, unspecified: Secondary | ICD-10-CM | POA: Diagnosis not present

## 2017-07-01 DIAGNOSIS — J9620 Acute and chronic respiratory failure, unspecified whether with hypoxia or hypercapnia: Secondary | ICD-10-CM | POA: Diagnosis not present

## 2017-07-01 DIAGNOSIS — I10 Essential (primary) hypertension: Secondary | ICD-10-CM | POA: Diagnosis not present

## 2017-07-01 DIAGNOSIS — I82409 Acute embolism and thrombosis of unspecified deep veins of unspecified lower extremity: Secondary | ICD-10-CM | POA: Diagnosis not present

## 2017-07-04 DIAGNOSIS — I10 Essential (primary) hypertension: Secondary | ICD-10-CM | POA: Diagnosis not present

## 2017-07-04 DIAGNOSIS — J449 Chronic obstructive pulmonary disease, unspecified: Secondary | ICD-10-CM | POA: Diagnosis not present

## 2017-07-04 DIAGNOSIS — J9610 Chronic respiratory failure, unspecified whether with hypoxia or hypercapnia: Secondary | ICD-10-CM | POA: Diagnosis not present

## 2017-07-04 DIAGNOSIS — R0902 Hypoxemia: Secondary | ICD-10-CM | POA: Diagnosis not present

## 2017-07-21 DIAGNOSIS — I10 Essential (primary) hypertension: Secondary | ICD-10-CM | POA: Diagnosis not present

## 2017-07-21 DIAGNOSIS — I739 Peripheral vascular disease, unspecified: Secondary | ICD-10-CM | POA: Diagnosis not present

## 2017-07-21 DIAGNOSIS — M47816 Spondylosis without myelopathy or radiculopathy, lumbar region: Secondary | ICD-10-CM | POA: Diagnosis not present

## 2017-07-21 DIAGNOSIS — R8290 Unspecified abnormal findings in urine: Secondary | ICD-10-CM | POA: Diagnosis not present

## 2017-07-21 DIAGNOSIS — J432 Centrilobular emphysema: Secondary | ICD-10-CM | POA: Diagnosis not present

## 2017-07-21 DIAGNOSIS — D649 Anemia, unspecified: Secondary | ICD-10-CM | POA: Diagnosis not present

## 2017-07-21 DIAGNOSIS — R911 Solitary pulmonary nodule: Secondary | ICD-10-CM | POA: Diagnosis not present

## 2017-07-28 DIAGNOSIS — M5136 Other intervertebral disc degeneration, lumbar region: Secondary | ICD-10-CM | POA: Diagnosis not present

## 2017-07-28 DIAGNOSIS — D649 Anemia, unspecified: Secondary | ICD-10-CM | POA: Diagnosis not present

## 2017-07-28 DIAGNOSIS — K219 Gastro-esophageal reflux disease without esophagitis: Secondary | ICD-10-CM | POA: Diagnosis not present

## 2017-07-28 DIAGNOSIS — J449 Chronic obstructive pulmonary disease, unspecified: Secondary | ICD-10-CM | POA: Diagnosis not present

## 2017-07-28 DIAGNOSIS — I2510 Atherosclerotic heart disease of native coronary artery without angina pectoris: Secondary | ICD-10-CM | POA: Diagnosis not present

## 2017-07-28 DIAGNOSIS — I2584 Coronary atherosclerosis due to calcified coronary lesion: Secondary | ICD-10-CM | POA: Diagnosis not present

## 2017-07-28 DIAGNOSIS — I739 Peripheral vascular disease, unspecified: Secondary | ICD-10-CM | POA: Diagnosis not present

## 2017-07-28 DIAGNOSIS — I10 Essential (primary) hypertension: Secondary | ICD-10-CM | POA: Diagnosis not present

## 2017-08-01 DIAGNOSIS — I82409 Acute embolism and thrombosis of unspecified deep veins of unspecified lower extremity: Secondary | ICD-10-CM | POA: Diagnosis not present

## 2017-08-01 DIAGNOSIS — I10 Essential (primary) hypertension: Secondary | ICD-10-CM | POA: Diagnosis not present

## 2017-08-01 DIAGNOSIS — J449 Chronic obstructive pulmonary disease, unspecified: Secondary | ICD-10-CM | POA: Diagnosis not present

## 2017-08-01 DIAGNOSIS — J9620 Acute and chronic respiratory failure, unspecified whether with hypoxia or hypercapnia: Secondary | ICD-10-CM | POA: Diagnosis not present

## 2017-08-04 DIAGNOSIS — M5416 Radiculopathy, lumbar region: Secondary | ICD-10-CM | POA: Diagnosis not present

## 2017-08-04 DIAGNOSIS — J449 Chronic obstructive pulmonary disease, unspecified: Secondary | ICD-10-CM | POA: Diagnosis not present

## 2017-08-04 DIAGNOSIS — R0902 Hypoxemia: Secondary | ICD-10-CM | POA: Diagnosis not present

## 2017-08-04 DIAGNOSIS — I10 Essential (primary) hypertension: Secondary | ICD-10-CM | POA: Diagnosis not present

## 2017-08-04 DIAGNOSIS — J9610 Chronic respiratory failure, unspecified whether with hypoxia or hypercapnia: Secondary | ICD-10-CM | POA: Diagnosis not present

## 2017-08-04 DIAGNOSIS — M5136 Other intervertebral disc degeneration, lumbar region: Secondary | ICD-10-CM | POA: Diagnosis not present

## 2017-08-06 DIAGNOSIS — R0602 Shortness of breath: Secondary | ICD-10-CM | POA: Diagnosis not present

## 2017-08-09 ENCOUNTER — Encounter: Payer: Medicare HMO | Attending: Internal Medicine

## 2017-08-09 ENCOUNTER — Other Ambulatory Visit: Payer: Self-pay

## 2017-08-09 VITALS — Ht 64.1 in | Wt 152.4 lb

## 2017-08-09 DIAGNOSIS — Z7982 Long term (current) use of aspirin: Secondary | ICD-10-CM | POA: Diagnosis not present

## 2017-08-09 DIAGNOSIS — J449 Chronic obstructive pulmonary disease, unspecified: Secondary | ICD-10-CM | POA: Diagnosis not present

## 2017-08-09 DIAGNOSIS — Z7951 Long term (current) use of inhaled steroids: Secondary | ICD-10-CM | POA: Diagnosis not present

## 2017-08-09 DIAGNOSIS — Z79899 Other long term (current) drug therapy: Secondary | ICD-10-CM | POA: Diagnosis not present

## 2017-08-09 DIAGNOSIS — M1990 Unspecified osteoarthritis, unspecified site: Secondary | ICD-10-CM | POA: Insufficient documentation

## 2017-08-09 DIAGNOSIS — F419 Anxiety disorder, unspecified: Secondary | ICD-10-CM | POA: Insufficient documentation

## 2017-08-09 DIAGNOSIS — E785 Hyperlipidemia, unspecified: Secondary | ICD-10-CM | POA: Insufficient documentation

## 2017-08-09 DIAGNOSIS — Z87891 Personal history of nicotine dependence: Secondary | ICD-10-CM | POA: Insufficient documentation

## 2017-08-09 DIAGNOSIS — I10 Essential (primary) hypertension: Secondary | ICD-10-CM | POA: Diagnosis not present

## 2017-08-09 NOTE — Patient Instructions (Signed)
Patient Instructions  Patient Details  Name: Bethany [name] MRN: [id number] Date of Birth: March 29, 1936 Referring Provider:  Barbette Reichmann, MD  Below are your personal goals for exercise, nutrition, and risk factors. Our goal is to help you stay on track towards obtaining and maintaining these goals. We will be discussing your progress on these goals with you throughout the program.  Initial Exercise Prescription: Initial Exercise Prescription - 08/09/17 1600      Date of Initial Exercise RX and Referring Provider   Date  08/09/17    Referring Provider  Barbette Reichmann MD      Oxygen   Oxygen  Continuous    Liters  3      Treadmill   MPH  1.3    Grade  0.5    Minutes  15    METs  2.09      NuStep   Level  1    SPM  80    Minutes  15    METs  2      Biostep-RELP   Level  1    SPM  50    Minutes  15    METs  2      Prescription Details   Frequency (times per week)  3    Duration  Progress to 45 minutes of aerobic exercise without signs/symptoms of physical distress      Intensity   THRR 40-80% of Max Heartrate  102-128    Ratings of Perceived Exertion  11-13    Perceived Dyspnea  0-4      Progression   Progression  Continue to progress workloads to maintain intensity without signs/symptoms of physical distress.      Resistance Training   Training Prescription  Yes    Weight  3 lbs    Reps  10-15       Exercise Goals: Frequency: Be able to perform aerobic exercise two to three times per week in program working toward 2-5 days per week of home exercise.  Intensity: Work with a perceived exertion of 11 (fairly light) - 15 (hard) while following your exercise prescription.  We will make changes to your prescription with you as you progress through the program.   Duration: Be able to do 30 to 45 minutes of continuous aerobic exercise in addition to a 5 minute warm-up and a 5 minute cool-down routine.   Nutrition Goals: Your personal nutrition goals will be  established when you do your nutrition analysis with the dietician.  The following are general nutrition guidelines to follow: Cholesterol < 200mg /day Sodium < 1500mg /day Fiber: Women over 50 yrs - 21 grams per day  Personal Goals: Personal Goals and Risk Factors at Admission - 08/09/17 1437      Core Components/Risk Factors/Patient Goals on Admission    Weight Management  Yes;Weight Loss    Intervention  Weight Management: Develop a combined nutrition and exercise program designed to reach desired caloric intake, while maintaining appropriate intake of nutrient and fiber, sodium and fats, and appropriate energy expenditure required for the weight goal.;Weight Management: Provide education and appropriate resources to help participant work on and attain dietary goals.;Weight Management/Obesity: Establish reasonable short term and long term weight goals.    Admit Weight  152 lb 6.4 oz (69.1 kg)    Goal Weight: Short Term  147 lb (66.7 kg)    Goal Weight: Long Term  140 lb (63.5 kg)    Expected Outcomes  Long Term: Adherence to nutrition  and physical activity/exercise program aimed toward attainment of established weight goal;Short Term: Continue to assess and modify interventions until short term weight is achieved;Weight Maintenance: Understanding of the daily nutrition guidelines, which includes 25-35% calories from fat, 7% or less cal from saturated fats, less than 200mg  cholesterol, less than 1.5gm of sodium, & 5 or more servings of fruits and vegetables daily;Weight Loss: Understanding of general recommendations for a balanced deficit meal plan, which promotes 1-2 lb weight loss per week and includes a negative energy balance of [phone] kcal/d;Understanding recommendations for meals to include 15-35% energy as protein, 25-35% energy from fat, 35-60% energy from carbohydrates, less than 200mg  of dietary cholesterol, 20-35 gm of total fiber daily;Understanding of distribution of calorie intake  throughout the day with the consumption of 4-5 meals/snacks    Improve shortness of breath with ADL's  Yes    Intervention  Provide education, individualized exercise plan and daily activity instruction to help decrease symptoms of SOB with activities of daily living.    Expected Outcomes  Short Term: Improve cardiorespiratory fitness to achieve a reduction of symptoms when performing ADLs;Long Term: Be able to perform more ADLs without symptoms or delay the onset of symptoms       Tobacco Use Initial Evaluation: Social History   Tobacco Use  Smoking Status Former Smoker  . Packs/day: 1.50  . Years: 30.00  . Pack years: 45.00  . Types: Cigarettes  . Last attempt to quit: 10/06/2014  . Years since quitting: 2.8  Smokeless Tobacco Never Used    Exercise Goals and Review: Exercise Goals    Row Name 08/09/17 1633             Exercise Goals   Increase Physical Activity  Yes       Intervention  Provide advice, education, support and counseling about physical activity/exercise needs.;Develop an individualized exercise prescription for aerobic and resistive training based on initial evaluation findings, risk stratification, comorbidities and participant's personal goals.       Expected Outcomes  Short Term: Attend rehab on a regular basis to increase amount of physical activity.;Long Term: Add in home exercise to make exercise part of routine and to increase amount of physical activity.;Long Term: Exercising regularly at least 3-5 days a week.       Increase Strength and Stamina  Yes       Intervention  Provide advice, education, support and counseling about physical activity/exercise needs.;Develop an individualized exercise prescription for aerobic and resistive training based on initial evaluation findings, risk stratification, comorbidities and participant's personal goals.       Expected Outcomes  Short Term: Increase workloads from initial exercise prescription for resistance, speed,  and METs.;Short Term: Perform resistance training exercises routinely during rehab and add in resistance training at home;Long Term: Improve cardiorespiratory fitness, muscular endurance and strength as measured by increased METs and functional capacity ( )       Able to understand and use rate of perceived exertion (RPE) scale  Yes       Intervention  Provide education and explanation on how to use RPE scale       Expected Outcomes  Short Term: Able to use RPE daily in rehab to express subjective intensity level;Long Term:  Able to use RPE to guide intensity level when exercising independently       Able to understand and use Dyspnea scale  Yes       Intervention  Provide education and explanation on how to use Dyspnea scale  Expected Outcomes  Short Term: Able to use Dyspnea scale daily in rehab to express subjective sense of shortness of breath during exertion;Long Term: Able to use Dyspnea scale to guide intensity level when exercising independently       Knowledge and understanding of Target Heart Rate Range (THRR)  Yes       Intervention  Provide education and explanation of THRR including how the numbers were predicted and where they are located for reference       Expected Outcomes  Short Term: Able to state/look up THRR;Long Term: Able to use THRR to govern intensity when exercising independently;Short Term: Able to use daily as guideline for intensity in rehab       Able to check pulse independently  Yes       Intervention  Provide education and demonstration on how to check pulse in carotid and radial arteries.;Review the importance of being able to check your own pulse for safety during independent exercise       Expected Outcomes  Short Term: Able to explain why pulse checking is important during independent exercise;Long Term: Able to check pulse independently and accurately       Understanding of Exercise Prescription  Yes       Intervention  Provide education, explanation, and  written materials on patient's individual exercise prescription       Expected Outcomes  Short Term: Able to explain program exercise prescription;Long Term: Able to explain home exercise prescription to exercise independently          Copy of goals given to participant.

## 2017-08-09 NOTE — Progress Notes (Signed)
Pulmonary Individual Treatment Plan  Patient Details  Name: Bethany [name] MRN: [id number] Date of Birth: [birth year] Referring Provider:     Pulmonary Rehab from 08/09/2017 in [facility] Cardiac and Pulmonary Rehab  Referring Provider  Tracie Harrier MD      Initial Encounter Date:    Pulmonary Rehab from 08/09/2017 in [facility] Cardiac and Pulmonary Rehab  Date  08/09/17  Referring Provider  Tracie Harrier MD      Visit Diagnosis: Chronic obstructive pulmonary disease, unspecified COPD type ([place])  Patient's Home Medications on Admission:  Current Outpatient Medications:  .  albuterol (PROVENTIL HFA;VENTOLIN HFA) 108 (90 Base) MCG/ACT inhaler, Inhale 2 puffs into the lungs every 6 (six) hours as needed for wheezing or shortness of breath., Disp: 1 Inhaler, Rfl: 2 .  aspirin (ASPIRIN EC) 81 MG EC tablet, Take 81 mg by mouth daily. Swallow whole., Disp: , Rfl:  .  baclofen (LIORESAL) 10 MG tablet, Take 10 mg by mouth 3 (three) times daily., Disp: , Rfl:  .  CALCIUM CARBONATE PO, Take 1 tablet by mouth daily., Disp: , Rfl:  .  cetirizine (ZYRTEC) 10 MG tablet, Take 10 mg by mouth daily., Disp: , Rfl:  .  Cholecalciferol (VITAMIN D3) 1000 units CAPS, Take 1 capsule by mouth daily., Disp: , Rfl:  .  diazepam (VALIUM) 5 MG tablet, Take 5 mg by mouth at bedtime. Patient takes once at night Reported on 10/16/2015, Disp: , Rfl:  .  docusate sodium (COLACE) 100 MG capsule, Take 100 mg by mouth 2 (two) times daily as needed for mild constipation., Disp: , Rfl:  .  escitalopram (LEXAPRO) 5 MG tablet, Take 10 mg by mouth daily. , Disp: , Rfl:  .  Fluticasone-Salmeterol (ADVAIR) 250-50 MCG/DOSE AEPB, Inhale 1 puff into the lungs 2 (two) times daily., Disp: , Rfl:  .  gabapentin (NEURONTIN) 300 MG capsule, Take 300 mg by mouth 3 (three) times daily., Disp: , Rfl:  .  HYDROcodone-acetaminophen (NORCO/VICODIN) 5-325 MG tablet, Take 1 tablet by mouth 5 (five) times daily as needed for moderate pain. Do not  exceed 5 times daily, patient at least 3 day, Disp: , Rfl:  .  losartan (COZAAR) 50 MG tablet, Take 50 mg by mouth daily., Disp: , Rfl:  .  lovastatin (MEVACOR) 20 MG tablet, Take 20 mg by mouth at bedtime. Reported on 12/12/2015, Disp: , Rfl:  .  metoprolol succinate (TOPROL-XL) 25 MG 24 hr tablet, Take 25 mg by mouth daily., Disp: , Rfl:  .  montelukast (SINGULAIR) 10 MG tablet, Take 10 mg by mouth at bedtime. Reported on 12/12/2015, Disp: , Rfl:  .  tamsulosin (FLOMAX) 0.4 MG CAPS capsule, Take 0.4 mg by mouth daily after supper., Disp: , Rfl:  .  vitamin B-12 (CYANOCOBALAMIN) 1000 MCG tablet, Take 1,000 mcg by mouth daily., Disp: , Rfl:   Past Medical History: Past Medical History:  Diagnosis Date  . Allergy   . Anxiety   . Arthritis   . COPD (chronic obstructive pulmonary disease) ([place])   . Hyperlipidemia   . Hypertension   . Oxygen deficiency     Tobacco Use: Social History   Tobacco Use  Smoking Status Former Smoker  . Packs/day: 1.50  . Years: 30.00  . Pack years: 45.00  . Types: Cigarettes  . Last attempt to quit: 10/06/2014  . Years since quitting: 2.8  Smokeless Tobacco Never Used    Labs: Recent Review Flowsheet Data    There is no flowsheet data  to display.       Pulmonary Assessment Scores: Pulmonary Assessment Scores    Row Name 08/09/17 1425         ADL UCSD   ADL Phase  Entry     SOB Score total  63     Rest  0     Walk  3     Stairs  5     Bath  0     Dress  1     Shop  2       CAT Score   CAT Score  26       mMRC Score   mMRC Score  3        Pulmonary Function Assessment: Pulmonary Function Assessment - 08/09/17 1446      Initial Spirometry Results   FVC%  36 %    FEV1%  30 %    FEV1/FVC Ratio  62.78    Comments  Good patient effort      Post Bronchodilator Spirometry Results   FVC%  43.92 %    FEV1%  32.1 %    FEV1/FVC Ratio  55.17    Comments  Good patient effort      Breath   Bilateral Breath Sounds  Clear;Decreased     Shortness of Breath  Limiting activity;Panic with Shortness of Breath;Yes       Exercise Target Goals: Date: 08/09/17  Exercise Program Goal: Individual exercise prescription set using results from initial 6 min walk test and THRR while considering  patient's activity barriers and safety.    Exercise Prescription Goal: Initial exercise prescription builds to 30-45 minutes a day of aerobic activity, 2-3 days per week.  Home exercise guidelines will be given to patient during program as part of exercise prescription that the participant will acknowledge.  Activity Barriers & Risk Stratification: Activity Barriers & Cardiac Risk Stratification - 08/09/17 1630      Activity Barriers & Cardiac Risk Stratification   Activity Barriers  Back Problems;Deconditioning;Muscular Weakness;Shortness of Breath 8+ surgeries on her back       6 Minute Walk: 6 Minute Walk    Row Name 08/09/17 1627         6 Minute Walk   Phase  Initial     Distance  680 feet     Walk Time  4.77 minutes     # of Rest Breaks  1 1:14     MPH  1.62     METS  2.03     RPE  17     Perceived Dyspnea   3     VO2 Peak  7.11     Symptoms  Yes (comment)     Comments  back pain 7/10, breathing became painful     Resting HR  76 bpm     Resting BP  146/64     Resting Oxygen Saturation   97 %     Exercise Oxygen Saturation  during 6 min walk  86 %     Max Ex. HR  115 bpm     Max Ex. BP  194/104     2 Minute Post BP  174/76 rck 154/70       Interval HR   1 Minute HR  109     2 Minute HR  115     3 Minute HR  110     4 Minute HR  98     5 Minute HR  112  6 Minute HR  115     2 Minute Post HR  82     Interval Heart Rate?  Yes       Interval Oxygen   Interval Oxygen?  Yes     Baseline Oxygen Saturation %  97 %     1 Minute Oxygen Saturation %  91 %     1 Minute Liters of Oxygen  3 L pulsed     2 Minute Oxygen Saturation %  89 %     2 Minute Liters of Oxygen  3 L     3 Minute Oxygen Saturation %  86 %  rest break 2:26-3:40     3 Minute Liters of Oxygen  3 L     4 Minute Oxygen Saturation %  93 %     4 Minute Liters of Oxygen  3 L     5 Minute Oxygen Saturation %  89 %     5 Minute Liters of Oxygen  3 L     6 Minute Oxygen Saturation %  86 %     6 Minute Liters of Oxygen  3 L     2 Minute Post Oxygen Saturation %  96 %     2 Minute Post Liters of Oxygen  3 L       Oxygen Initial Assessment: Oxygen Initial Assessment - 08/09/17 1436      Home Oxygen   Home Oxygen Device  Portable Concentrator;Home Concentrator;E-Tanks    Sleep Oxygen Prescription  Continuous    Liters per minute  3    Home Exercise Oxygen Prescription  Continuous    Liters per minute  3    Home at Rest Exercise Oxygen Prescription  Continuous    Liters per minute  3      Initial 6 min Walk   Oxygen Used  Pulsed    Liters per minute  3      Program Oxygen Prescription   Program Oxygen Prescription  Continuous    Liters per minute  3      Intervention   Short Term Goals  To learn and exhibit compliance with exercise, home and travel O2 prescription;To learn and understand importance of maintaining oxygen saturations>88%;To learn and demonstrate proper use of respiratory medications;To learn and demonstrate proper pursed lip breathing techniques or other breathing techniques.;To learn and understand importance of monitoring SPO2 with pulse oximeter and demonstrate accurate use of the pulse oximeter.    Long  Term Goals  Exhibits compliance with exercise, home and travel O2 prescription;Verbalizes importance of monitoring SPO2 with pulse oximeter and return demonstration;Maintenance of O2 saturations>88%;Exhibits proper breathing techniques, such as pursed lip breathing or other method taught during program session;Compliance with respiratory medication;Demonstrates proper use of MDI's       Oxygen Re-Evaluation:   Oxygen Discharge (Final Oxygen Re-Evaluation):   Initial Exercise Prescription: Initial  Exercise Prescription - 08/09/17 1600      Date of Initial Exercise RX and Referring Provider   Date  08/09/17    Referring Provider  Tracie Harrier MD      Oxygen   Oxygen  Continuous    Liters  3      Treadmill   MPH  1.3    Grade  0.5    Minutes  15    METs  2.09      NuStep   Level  1    SPM  80    Minutes  15  METs  2      Biostep-RELP   Level  1    SPM  50    Minutes  15    METs  2      Prescription Details   Frequency (times per week)  3    Duration  Progress to 45 minutes of aerobic exercise without signs/symptoms of physical distress      Intensity   THRR 40-80% of Max Heartrate  102-128    Ratings of Perceived Exertion  11-13    Perceived Dyspnea  0-4      Progression   Progression  Continue to progress workloads to maintain intensity without signs/symptoms of physical distress.      Resistance Training   Training Prescription  Yes    Weight  3 lbs    Reps  10-15       Perform Capillary Blood Glucose checks as needed.  Exercise Prescription Changes: Exercise Prescription Changes    Row Name 08/09/17 1600             Response to Exercise   Blood Pressure (Admit)  146/64       Blood Pressure (Exercise)  194/104       Blood Pressure (Exit)  154/70       Heart Rate (Admit)  76 bpm       Heart Rate (Exercise)  115 bpm       Heart Rate (Exit)  82 bpm       Oxygen Saturation (Admit)  97 %       Oxygen Saturation (Exercise)  86 %       Oxygen Saturation (Exit)  96 %       Rating of Perceived Exertion (Exercise)  17       Perceived Dyspnea (Exercise)  3       Symptoms  back pain 7/10, painful breathing       Comments  walk test results          Exercise Comments:   Exercise Goals and Review: Exercise Goals    Row Name 08/09/17 1633             Exercise Goals   Increase Physical Activity  Yes       Intervention  Provide advice, education, support and counseling about physical activity/exercise needs.;Develop an individualized  exercise prescription for aerobic and resistive training based on initial evaluation findings, risk stratification, comorbidities and participant's personal goals.       Expected Outcomes  Short Term: Attend rehab on a regular basis to increase amount of physical activity.;Long Term: Add in home exercise to make exercise part of routine and to increase amount of physical activity.;Long Term: Exercising regularly at least 3-5 days a week.       Increase Strength and Stamina  Yes       Intervention  Provide advice, education, support and counseling about physical activity/exercise needs.;Develop an individualized exercise prescription for aerobic and resistive training based on initial evaluation findings, risk stratification, comorbidities and participant's personal goals.       Expected Outcomes  Short Term: Increase workloads from initial exercise prescription for resistance, speed, and METs.;Short Term: Perform resistance training exercises routinely during rehab and add in resistance training at home;Long Term: Improve cardiorespiratory fitness, muscular endurance and strength as measured by increased METs and functional capacity (6MWT)       Able to understand and use rate of perceived exertion (RPE) scale  Yes  Intervention  Provide education and explanation on how to use RPE scale       Expected Outcomes  Short Term: Able to use RPE daily in rehab to express subjective intensity level;Long Term:  Able to use RPE to guide intensity level when exercising independently       Able to understand and use Dyspnea scale  Yes       Intervention  Provide education and explanation on how to use Dyspnea scale       Expected Outcomes  Short Term: Able to use Dyspnea scale daily in rehab to express subjective sense of shortness of breath during exertion;Long Term: Able to use Dyspnea scale to guide intensity level when exercising independently       Knowledge and understanding of Target Heart Rate Range  (THRR)  Yes       Intervention  Provide education and explanation of THRR including how the numbers were predicted and where they are located for reference       Expected Outcomes  Short Term: Able to state/look up THRR;Long Term: Able to use THRR to govern intensity when exercising independently;Short Term: Able to use daily as guideline for intensity in rehab       Able to check pulse independently  Yes       Intervention  Provide education and demonstration on how to check pulse in carotid and radial arteries.;Review the importance of being able to check your own pulse for safety during independent exercise       Expected Outcomes  Short Term: Able to explain why pulse checking is important during independent exercise;Long Term: Able to check pulse independently and accurately       Understanding of Exercise Prescription  Yes       Intervention  Provide education, explanation, and written materials on patient's individual exercise prescription       Expected Outcomes  Short Term: Able to explain program exercise prescription;Long Term: Able to explain home exercise prescription to exercise independently          Exercise Goals Re-Evaluation :   Discharge Exercise Prescription (Final Exercise Prescription Changes): Exercise Prescription Changes - 08/09/17 1600      Response to Exercise   Blood Pressure (Admit)  146/64    Blood Pressure (Exercise)  194/104    Blood Pressure (Exit)  154/70    Heart Rate (Admit)  76 bpm    Heart Rate (Exercise)  115 bpm    Heart Rate (Exit)  82 bpm    Oxygen Saturation (Admit)  97 %    Oxygen Saturation (Exercise)  86 %    Oxygen Saturation (Exit)  96 %    Rating of Perceived Exertion (Exercise)  17    Perceived Dyspnea (Exercise)  3    Symptoms  back pain 7/10, painful breathing    Comments  walk test results       Nutrition:  Target Goals: Understanding of nutrition guidelines, daily intake of sodium <1539m, cholesterol <2025m calories 30% from  fat and 7% or less from saturated fats, daily to have 5 or more servings of fruits and vegetables.  Biometrics: Pre Biometrics - 08/09/17 1633      Pre Biometrics   Height  5' 4.1" (1.628 m)    Weight  152 lb 6.4 oz (69.1 kg)    Waist Circumference  31 inches    Hip Circumference  39 inches    Waist to Hip Ratio  0.79 %    BMI (  Calculated)  26.08        Nutrition Therapy Plan and Nutrition Goals: Nutrition Therapy & Goals - 08/09/17 1434      Personal Nutrition Goals   Comments  Lose some weight. Learn how to manage to cook meals that can help with her shortness of breath.      Intervention Plan   Intervention  Prescribe, educate and counsel regarding individualized specific dietary modifications aiming towards targeted core components such as weight, hypertension, lipid management, diabetes, heart failure and other comorbidities.;Nutrition handout(s) given to patient.    Expected Outcomes  Short Term Goal: Understand basic principles of dietary content, such as calories, fat, sodium, cholesterol and nutrients.;Long Term Goal: Adherence to prescribed nutrition plan.       Nutrition Assessments: Nutrition Assessments - 08/09/17 1428      MEDFICTS Scores   Pre Score  24       Nutrition Goals Re-Evaluation:   Nutrition Goals Discharge (Final Nutrition Goals Re-Evaluation):   Psychosocial: Target Goals: Acknowledge presence or absence of significant depression and/or stress, maximize coping skills, provide positive support system. Participant is able to verbalize types and ability to use techniques and skills needed for reducing stress and depression.   Initial Review & Psychosocial Screening: Initial Psych Review & Screening - 08/09/17 1433      Initial Review   Current issues with  Current Anxiety/Panic      Family Dynamics   Good Support System?  Yes    Comments  She can turn to her brother for support.      Barriers   Psychosocial barriers to participate in  program  The patient should benefit from training in stress management and relaxation.      Screening Interventions   Interventions  Encouraged to exercise;To provide support and resources with identified psychosocial needs;Program counselor consult;Provide feedback about the scores to participant    Expected Outcomes  Short Term goal: Utilizing psychosocial counselor, staff and physician to assist with identification of specific Stressors or current issues interfering with healing process. Setting desired goal for each stressor or current issue identified.;Long Term Goal: Stressors or current issues are controlled or eliminated.;Short Term goal: Identification and review with participant of any Quality of Life or Depression concerns found by scoring the questionnaire.;Long Term goal: The participant improves quality of Life and PHQ9 Scores as seen by post scores and/or verbalization of changes       Quality of Life Scores:  Scores of 19 and below usually indicate a poorer quality of life in these areas.  A difference of  2-3 points is a clinically meaningful difference.  A difference of 2-3 points in the total score of the Quality of Life Index has been associated with significant improvement in overall quality of life, self-image, physical symptoms, and general health in studies assessing change in quality of life.  PHQ-9: Recent Review Flowsheet Data    Depression screen [facility] 2/9 08/09/2017 04/28/2016 01/01/2016 12/12/2015 11/12/2015   Decreased Interest 1 2 0 0 0   Down, Depressed, Hopeless 0 1 0 0 0   PHQ - 2 Score 1 3 0 0 0   Altered sleeping 2 2 - - -   Tired, decreased energy 2 3 - - -   Change in appetite 3 3 - - -   Feeling bad or failure about yourself  3 1 - - -   Trouble concentrating 0 2 - - -   Moving slowly or fidgety/restless 0 0 - - -  Suicidal thoughts 0 0 - - -   PHQ-9 Score 11 14 - - -   Difficult doing work/chores Very difficult Somewhat difficult - - -     Interpretation  of Total Score  Total Score Depression Severity:  1-4 = Minimal depression, 5-9 = Mild depression, 10-14 = Moderate depression, 15-19 = Moderately severe depression, 20-27 = Severe depression   Psychosocial Evaluation and Intervention:   Psychosocial Re-Evaluation:   Psychosocial Discharge (Final Psychosocial Re-Evaluation):   Education: Education Goals: Education classes will be provided on a weekly basis, covering required topics. Participant will state understanding/return demonstration of topics presented.  Learning Barriers/Preferences: Learning Barriers/Preferences - 08/09/17 1446      Learning Barriers/Preferences   Learning Barriers  Sight wears glasses    Learning Preferences  None       Education Topics:  Initial Evaluation Education: - Verbal, written and demonstration of respiratory meds, oximetry and breathing techniques. Instruction on use of nebulizers and MDIs and importance of monitoring MDI activations.   Pulmonary Rehab from 08/09/2017 in [facility] Cardiac and Pulmonary Rehab  Date  08/09/17  Educator  [facility]  Instruction Review Code  1- Verbalizes Understanding      General Nutrition Guidelines/Fats and Fiber: -Group instruction provided by verbal, written material, models and posters to present the general guidelines for heart healthy nutrition. Gives an explanation and review of dietary fats and fiber.   Controlling Sodium/Reading Food Labels: -Group verbal and written material supporting the discussion of sodium use in heart healthy nutrition. Review and explanation with models, verbal and written materials for utilization of the food label.   Pulmonary Rehab from 06/17/2016 in [facility] Cardiac and Pulmonary Rehab  Date  05/04/16  Educator  CR  Instruction Review Code (retired)  2- meets goals/outcomes      Exercise Physiology & General Exercise Guidelines: - Group verbal and written instruction with models to review the exercise physiology of the  cardiovascular system and associated critical values. Provides general exercise guidelines with specific guidelines to those with heart or lung disease.    Pulmonary Rehab from 06/17/2016 in [facility] Cardiac and Pulmonary Rehab  Date  06/10/16  Educator  [facility]  Instruction Review Code (retired)  2- meets goals/outcomes      Aerobic Exercise & Resistance Training: - Gives group verbal and written instruction on the various components of exercise. Focuses on aerobic and resistive training programs and the benefits of this training and how to safely progress through these programs.   Flexibility, Balance, Mind/Body Relaxation: Provides group verbal/written instruction on the benefits of flexibility and balance training, including mind/body exercise modes such as yoga, pilates and tai chi.  Demonstration and skill practice provided.   Stress and Anxiety: - Provides group verbal and written instruction about the health risks of elevated stress and causes of high stress.  Discuss the correlation between heart/lung disease and anxiety and treatment options. Review healthy ways to manage with stress and anxiety.   Depression: - Provides group verbal and written instruction on the correlation between heart/lung disease and depressed mood, treatment options, and the stigmas associated with seeking treatment.   Pulmonary Rehab from 06/17/2016 in [facility] Cardiac and Pulmonary Rehab  Date  05/11/16  Educator  [facility]  Instruction Review Code (retired)  2- meets goals/outcomes      Exercise & Equipment Safety: - Individual verbal instruction and demonstration of equipment use and safety with use of the equipment.   Pulmonary Rehab from 08/09/2017 in [facility] Cardiac and Pulmonary Rehab  Date  08/09/17  Educator  [facility]  Instruction Review Code  1- Verbalizes Understanding      Infection Prevention: - Provides verbal and written material to individual with discussion of infection control including proper hand washing  and proper equipment cleaning during exercise session.   Pulmonary Rehab from 08/09/2017 in [facility] Cardiac and Pulmonary Rehab  Date  08/09/17  Educator  [facility]  Instruction Review Code  1- Verbalizes Understanding      Falls Prevention: - Provides verbal and written material to individual with discussion of falls prevention and safety.   Pulmonary Rehab from 08/09/2017 in [facility] Cardiac and Pulmonary Rehab  Date  08/09/17  Educator  [facility]  Instruction Review Code  1- Verbalizes Understanding      Diabetes: - Individual verbal and written instruction to review signs/symptoms of diabetes, desired ranges of glucose level fasting, after meals and with exercise. Advice that pre and post exercise glucose checks will be done for 3 sessions at entry of program.   Chronic Lung Diseases: - Group verbal and written instruction to review updates, respiratory medications, advancements in procedures and treatments. Discuss use of supplemental oxygen including available portable oxygen systems, continuous and intermittent flow rates, concentrators, personal use and safety guidelines. Review proper use of inhaler and spacers. Provide informative websites for self-education.    Energy Conservation: - Provide group verbal and written instruction for methods to conserve energy, plan and organize activities. Instruct on pacing techniques, use of adaptive equipment and posture/positioning to relieve shortness of breath.   Triggers and Exacerbations: - Group verbal and written instruction to review types of environmental triggers and ways to prevent exacerbations. Discuss weather changes, air quality and the benefits of nasal washing. Review warning signs and symptoms to help prevent infections. Discuss techniques for effective airway clearance, coughing, and vibrations.   Pulmonary Rehab from 06/17/2016 in [facility] Cardiac and Pulmonary Rehab  Date  06/03/16  Educator  LB  Instruction Review Code (retired)  2- meets  goals/outcomes      AED/CPR: - Group verbal and written instruction with the use of models to demonstrate the basic use of the AED with the basic ABC's of resuscitation.   Anatomy and Physiology of the Lungs: - Group verbal and written instruction with the use of models to provide basic lung anatomy and physiology related to function, structure and complications of lung disease.   Pulmonary Rehab from 06/17/2016 in [facility] Cardiac and Pulmonary Rehab  Date  05/06/16  Educator  LB  Instruction Review Code (retired)  2- meets Designer, fashion/clothing & Physiology of the Heart: - Group verbal and written instruction and models provide basic cardiac anatomy and physiology, with the coronary electrical and arterial systems. Review of Valvular disease and Heart Failure   Cardiac Medications: - Group verbal and written instruction to review commonly prescribed medications for heart disease. Reviews the medication, class of the drug, and side effects.   Know Your Numbers and Risk Factors: -Group verbal and written instruction about important numbers in your health.  Discussion of what are risk factors and how they play a role in the disease process.  Review of Cholesterol, Blood Pressure, Diabetes, and BMI and the role they play in your overall health.   Sleep Hygiene: -Provides group verbal and written instruction about how sleep can affect your health.  Define sleep hygiene, discuss sleep cycles and impact of sleep habits. Review good sleep hygiene tips.    Other: -Provides group and verbal  instruction on various topics (see comments)    Knowledge Questionnaire Score: Knowledge Questionnaire Score - 08/09/17 1432      Knowledge Questionnaire Score   Pre Score  14/18 reviewed with patient        Core Components/Risk Factors/Patient Goals at Admission: Personal Goals and Risk Factors at Admission - 08/09/17 1437      Core Components/Risk Factors/Patient Goals on Admission     Weight Management  Yes;Weight Loss    Intervention  Weight Management: Develop a combined nutrition and exercise program designed to reach desired caloric intake, while maintaining appropriate intake of nutrient and fiber, sodium and fats, and appropriate energy expenditure required for the weight goal.;Weight Management: Provide education and appropriate resources to help participant work on and attain dietary goals.;Weight Management/Obesity: Establish reasonable short term and long term weight goals.    Admit Weight  152 lb 6.4 oz (69.1 kg)    Goal Weight: Short Term  147 lb (66.7 kg)    Goal Weight: Long Term  140 lb (63.5 kg)    Expected Outcomes  Long Term: Adherence to nutrition and physical activity/exercise program aimed toward attainment of established weight goal;Short Term: Continue to assess and modify interventions until short term weight is achieved;Weight Maintenance: Understanding of the daily nutrition guidelines, which includes 25-35% calories from fat, 7% or less cal from saturated fats, less than 243m cholesterol, less than 1.5gm of sodium, & 5 or more servings of fruits and vegetables daily;Weight Loss: Understanding of general recommendations for a balanced deficit meal plan, which promotes 1-2 lb weight loss per week and includes a negative energy balance of [phone] kcal/d;Understanding recommendations for meals to include 15-35% energy as protein, 25-35% energy from fat, 35-60% energy from carbohydrates, less than 2035mof dietary cholesterol, 20-35 gm of total fiber daily;Understanding of distribution of calorie intake throughout the day with the consumption of 4-5 meals/snacks    Improve shortness of breath with ADL's  Yes    Intervention  Provide education, individualized exercise plan and daily activity instruction to help decrease symptoms of SOB with activities of daily living.    Expected Outcomes  Short Term: Improve cardiorespiratory fitness to achieve a reduction of  symptoms when performing ADLs;Long Term: Be able to perform more ADLs without symptoms or delay the onset of symptoms       Core Components/Risk Factors/Patient Goals Review:    Core Components/Risk Factors/Patient Goals at Discharge (Final Review):    ITP Comments: ITP Comments    Row Name 08/09/17 1400           ITP Comments  Medical Evaluation completed. Chart sent for review and changes to Dr. MaEmily Filbertirector of LuChurch HillDiagnosis can be found in [facility].          Comments: Initial ITP

## 2017-08-11 DIAGNOSIS — I700 Atherosclerosis of aorta: Secondary | ICD-10-CM | POA: Diagnosis not present

## 2017-08-11 DIAGNOSIS — I10 Essential (primary) hypertension: Secondary | ICD-10-CM | POA: Diagnosis not present

## 2017-08-11 DIAGNOSIS — E7800 Pure hypercholesterolemia, unspecified: Secondary | ICD-10-CM | POA: Diagnosis not present

## 2017-08-11 DIAGNOSIS — R0602 Shortness of breath: Secondary | ICD-10-CM | POA: Diagnosis not present

## 2017-08-11 DIAGNOSIS — Z7689 Persons encountering health services in other specified circumstances: Secondary | ICD-10-CM | POA: Diagnosis not present

## 2017-08-11 DIAGNOSIS — I25118 Atherosclerotic heart disease of native coronary artery with other forms of angina pectoris: Secondary | ICD-10-CM | POA: Diagnosis not present

## 2017-08-16 DIAGNOSIS — J449 Chronic obstructive pulmonary disease, unspecified: Secondary | ICD-10-CM

## 2017-08-16 NOTE — Progress Notes (Signed)
Pulmonary Individual Treatment Plan  Patient Details  Name: Bethany [name] MRN: [id number] Date of Birth: [birth year] Referring Provider:     Pulmonary Rehab from 08/09/2017 in [facility] Cardiac and Pulmonary Rehab  Referring Provider  Tracie Harrier MD      Initial Encounter Date:    Pulmonary Rehab from 08/09/2017 in [facility] Cardiac and Pulmonary Rehab  Date  08/09/17  Referring Provider  Tracie Harrier MD      Visit Diagnosis: Chronic obstructive pulmonary disease, unspecified COPD type ([place])  Patient's Home Medications on Admission:  Current Outpatient Medications:  .  albuterol (PROVENTIL HFA;VENTOLIN HFA) 108 (90 Base) MCG/ACT inhaler, Inhale 2 puffs into the lungs every 6 (six) hours as needed for wheezing or shortness of breath., Disp: 1 Inhaler, Rfl: 2 .  aspirin (ASPIRIN EC) 81 MG EC tablet, Take 81 mg by mouth daily. Swallow whole., Disp: , Rfl:  .  baclofen (LIORESAL) 10 MG tablet, Take 10 mg by mouth 3 (three) times daily., Disp: , Rfl:  .  CALCIUM CARBONATE PO, Take 1 tablet by mouth daily., Disp: , Rfl:  .  cetirizine (ZYRTEC) 10 MG tablet, Take 10 mg by mouth daily., Disp: , Rfl:  .  Cholecalciferol (VITAMIN D3) 1000 units CAPS, Take 1 capsule by mouth daily., Disp: , Rfl:  .  diazepam (VALIUM) 5 MG tablet, Take 5 mg by mouth at bedtime. Patient takes once at night Reported on 10/16/2015, Disp: , Rfl:  .  docusate sodium (COLACE) 100 MG capsule, Take 100 mg by mouth 2 (two) times daily as needed for mild constipation., Disp: , Rfl:  .  escitalopram (LEXAPRO) 5 MG tablet, Take 10 mg by mouth daily. , Disp: , Rfl:  .  Fluticasone-Salmeterol (ADVAIR) 250-50 MCG/DOSE AEPB, Inhale 1 puff into the lungs 2 (two) times daily., Disp: , Rfl:  .  gabapentin (NEURONTIN) 300 MG capsule, Take 300 mg by mouth 3 (three) times daily., Disp: , Rfl:  .  HYDROcodone-acetaminophen (NORCO/VICODIN) 5-325 MG tablet, Take 1 tablet by mouth 5 (five) times daily as needed for moderate pain. Do not  exceed 5 times daily, patient at least 3 day, Disp: , Rfl:  .  losartan (COZAAR) 50 MG tablet, Take 50 mg by mouth daily., Disp: , Rfl:  .  lovastatin (MEVACOR) 20 MG tablet, Take 20 mg by mouth at bedtime. Reported on 12/12/2015, Disp: , Rfl:  .  metoprolol succinate (TOPROL-XL) 25 MG 24 hr tablet, Take 25 mg by mouth daily., Disp: , Rfl:  .  montelukast (SINGULAIR) 10 MG tablet, Take 10 mg by mouth at bedtime. Reported on 12/12/2015, Disp: , Rfl:  .  tamsulosin (FLOMAX) 0.4 MG CAPS capsule, Take 0.4 mg by mouth daily after supper., Disp: , Rfl:  .  vitamin B-12 (CYANOCOBALAMIN) 1000 MCG tablet, Take 1,000 mcg by mouth daily., Disp: , Rfl:   Past Medical History: Past Medical History:  Diagnosis Date  . Allergy   . Anxiety   . Arthritis   . COPD (chronic obstructive pulmonary disease) ([place])   . Hyperlipidemia   . Hypertension   . Oxygen deficiency     Tobacco Use: Social History   Tobacco Use  Smoking Status Former Smoker  . Packs/day: 1.50  . Years: 30.00  . Pack years: 45.00  . Types: Cigarettes  . Last attempt to quit: 10/06/2014  . Years since quitting: 2.8  Smokeless Tobacco Never Used    Labs: Recent Review Flowsheet Data    There is no flowsheet data  to display.       Pulmonary Assessment Scores: Pulmonary Assessment Scores    Row Name 08/09/17 1425         ADL UCSD   ADL Phase  Entry     SOB Score total  63     Rest  0     Walk  3     Stairs  5     Bath  0     Dress  1     Shop  2       CAT Score   CAT Score  26       mMRC Score   mMRC Score  3        Pulmonary Function Assessment: Pulmonary Function Assessment - 08/09/17 1446      Initial Spirometry Results   FVC%  36 %    FEV1%  30 %    FEV1/FVC Ratio  62.78    Comments  Good patient effort      Post Bronchodilator Spirometry Results   FVC%  43.92 %    FEV1%  32.1 %    FEV1/FVC Ratio  55.17    Comments  Good patient effort      Breath   Bilateral Breath Sounds  Clear;Decreased     Shortness of Breath  Limiting activity;Panic with Shortness of Breath;Yes       Exercise Target Goals:    Exercise Program Goal: Individual exercise prescription set using results from initial 6 min walk test and THRR while considering  patient's activity barriers and safety.    Exercise Prescription Goal: Initial exercise prescription builds to 30-45 minutes a day of aerobic activity, 2-3 days per week.  Home exercise guidelines will be given to patient during program as part of exercise prescription that the participant will acknowledge.  Activity Barriers & Risk Stratification: Activity Barriers & Cardiac Risk Stratification - 08/09/17 1630      Activity Barriers & Cardiac Risk Stratification   Activity Barriers  Back Problems;Deconditioning;Muscular Weakness;Shortness of Breath 8+ surgeries on her back       6 Minute Walk: 6 Minute Walk    Row Name 08/09/17 1627         6 Minute Walk   Phase  Initial     Distance  680 feet     Walk Time  4.77 minutes     # of Rest Breaks  1 1:14     MPH  1.62     METS  2.03     RPE  17     Perceived Dyspnea   3     VO2 Peak  7.11     Symptoms  Yes (comment)     Comments  back pain 7/10, breathing became painful     Resting HR  76 bpm     Resting BP  146/64     Resting Oxygen Saturation   97 %     Exercise Oxygen Saturation  during 6 min walk  86 %     Max Ex. HR  115 bpm     Max Ex. BP  194/104     2 Minute Post BP  174/76 rck 154/70       Interval HR   1 Minute HR  109     2 Minute HR  115     3 Minute HR  110     4 Minute HR  98     5 Minute HR  112  6 Minute HR  115     2 Minute Post HR  82     Interval Heart Rate?  Yes       Interval Oxygen   Interval Oxygen?  Yes     Baseline Oxygen Saturation %  97 %     1 Minute Oxygen Saturation %  91 %     1 Minute Liters of Oxygen  3 L pulsed     2 Minute Oxygen Saturation %  89 %     2 Minute Liters of Oxygen  3 L     3 Minute Oxygen Saturation %  86 % rest break  2:26-3:40     3 Minute Liters of Oxygen  3 L     4 Minute Oxygen Saturation %  93 %     4 Minute Liters of Oxygen  3 L     5 Minute Oxygen Saturation %  89 %     5 Minute Liters of Oxygen  3 L     6 Minute Oxygen Saturation %  86 %     6 Minute Liters of Oxygen  3 L     2 Minute Post Oxygen Saturation %  96 %     2 Minute Post Liters of Oxygen  3 L       Oxygen Initial Assessment: Oxygen Initial Assessment - 08/09/17 1436      Home Oxygen   Home Oxygen Device  Portable Concentrator;Home Concentrator;E-Tanks    Sleep Oxygen Prescription  Continuous    Liters per minute  3    Home Exercise Oxygen Prescription  Continuous    Liters per minute  3    Home at Rest Exercise Oxygen Prescription  Continuous    Liters per minute  3      Initial 6 min Walk   Oxygen Used  Pulsed    Liters per minute  3      Program Oxygen Prescription   Program Oxygen Prescription  Continuous    Liters per minute  3      Intervention   Short Term Goals  To learn and exhibit compliance with exercise, home and travel O2 prescription;To learn and understand importance of maintaining oxygen saturations>88%;To learn and demonstrate proper use of respiratory medications;To learn and demonstrate proper pursed lip breathing techniques or other breathing techniques.;To learn and understand importance of monitoring SPO2 with pulse oximeter and demonstrate accurate use of the pulse oximeter.    Long  Term Goals  Exhibits compliance with exercise, home and travel O2 prescription;Verbalizes importance of monitoring SPO2 with pulse oximeter and return demonstration;Maintenance of O2 saturations>88%;Exhibits proper breathing techniques, such as pursed lip breathing or other method taught during program session;Compliance with respiratory medication;Demonstrates proper use of MDI's       Oxygen Re-Evaluation:   Oxygen Discharge (Final Oxygen Re-Evaluation):   Initial Exercise Prescription: Initial Exercise  Prescription - 08/09/17 1600      Date of Initial Exercise RX and Referring Provider   Date  08/09/17    Referring Provider  Tracie Harrier MD      Oxygen   Oxygen  Continuous    Liters  3      Treadmill   MPH  1.3    Grade  0.5    Minutes  15    METs  2.09      NuStep   Level  1    SPM  80    Minutes  15  METs  2      Biostep-RELP   Level  1    SPM  50    Minutes  15    METs  2      Prescription Details   Frequency (times per week)  3    Duration  Progress to 45 minutes of aerobic exercise without signs/symptoms of physical distress      Intensity   THRR 40-80% of Max Heartrate  102-128    Ratings of Perceived Exertion  11-13    Perceived Dyspnea  0-4      Progression   Progression  Continue to progress workloads to maintain intensity without signs/symptoms of physical distress.      Resistance Training   Training Prescription  Yes    Weight  3 lbs    Reps  10-15       Perform Capillary Blood Glucose checks as needed.  Exercise Prescription Changes: Exercise Prescription Changes    Row Name 08/09/17 1600             Response to Exercise   Blood Pressure (Admit)  146/64       Blood Pressure (Exercise)  194/104       Blood Pressure (Exit)  154/70       Heart Rate (Admit)  76 bpm       Heart Rate (Exercise)  115 bpm       Heart Rate (Exit)  82 bpm       Oxygen Saturation (Admit)  97 %       Oxygen Saturation (Exercise)  86 %       Oxygen Saturation (Exit)  96 %       Rating of Perceived Exertion (Exercise)  17       Perceived Dyspnea (Exercise)  3       Symptoms  back pain 7/10, painful breathing       Comments  walk test results          Exercise Comments:   Exercise Goals and Review: Exercise Goals    Row Name 08/09/17 1633             Exercise Goals   Increase Physical Activity  Yes       Intervention  Provide advice, education, support and counseling about physical activity/exercise needs.;Develop an individualized exercise  prescription for aerobic and resistive training based on initial evaluation findings, risk stratification, comorbidities and participant's personal goals.       Expected Outcomes  Short Term: Attend rehab on a regular basis to increase amount of physical activity.;Long Term: Add in home exercise to make exercise part of routine and to increase amount of physical activity.;Long Term: Exercising regularly at least 3-5 days a week.       Increase Strength and Stamina  Yes       Intervention  Provide advice, education, support and counseling about physical activity/exercise needs.;Develop an individualized exercise prescription for aerobic and resistive training based on initial evaluation findings, risk stratification, comorbidities and participant's personal goals.       Expected Outcomes  Short Term: Increase workloads from initial exercise prescription for resistance, speed, and METs.;Short Term: Perform resistance training exercises routinely during rehab and add in resistance training at home;Long Term: Improve cardiorespiratory fitness, muscular endurance and strength as measured by increased METs and functional capacity (6MWT)       Able to understand and use rate of perceived exertion (RPE) scale  Yes  Intervention  Provide education and explanation on how to use RPE scale       Expected Outcomes  Short Term: Able to use RPE daily in rehab to express subjective intensity level;Long Term:  Able to use RPE to guide intensity level when exercising independently       Able to understand and use Dyspnea scale  Yes       Intervention  Provide education and explanation on how to use Dyspnea scale       Expected Outcomes  Short Term: Able to use Dyspnea scale daily in rehab to express subjective sense of shortness of breath during exertion;Long Term: Able to use Dyspnea scale to guide intensity level when exercising independently       Knowledge and understanding of Target Heart Rate Range (THRR)  Yes        Intervention  Provide education and explanation of THRR including how the numbers were predicted and where they are located for reference       Expected Outcomes  Short Term: Able to state/look up THRR;Long Term: Able to use THRR to govern intensity when exercising independently;Short Term: Able to use daily as guideline for intensity in rehab       Able to check pulse independently  Yes       Intervention  Provide education and demonstration on how to check pulse in carotid and radial arteries.;Review the importance of being able to check your own pulse for safety during independent exercise       Expected Outcomes  Short Term: Able to explain why pulse checking is important during independent exercise;Long Term: Able to check pulse independently and accurately       Understanding of Exercise Prescription  Yes       Intervention  Provide education, explanation, and written materials on patient's individual exercise prescription       Expected Outcomes  Short Term: Able to explain program exercise prescription;Long Term: Able to explain home exercise prescription to exercise independently          Exercise Goals Re-Evaluation :   Discharge Exercise Prescription (Final Exercise Prescription Changes): Exercise Prescription Changes - 08/09/17 1600      Response to Exercise   Blood Pressure (Admit)  146/64    Blood Pressure (Exercise)  194/104    Blood Pressure (Exit)  154/70    Heart Rate (Admit)  76 bpm    Heart Rate (Exercise)  115 bpm    Heart Rate (Exit)  82 bpm    Oxygen Saturation (Admit)  97 %    Oxygen Saturation (Exercise)  86 %    Oxygen Saturation (Exit)  96 %    Rating of Perceived Exertion (Exercise)  17    Perceived Dyspnea (Exercise)  3    Symptoms  back pain 7/10, painful breathing    Comments  walk test results       Nutrition:  Target Goals: Understanding of nutrition guidelines, daily intake of sodium '1500mg'$ , cholesterol '200mg'$ , calories 30% from fat and 7% or  less from saturated fats, daily to have 5 or more servings of fruits and vegetables.  Biometrics: Pre Biometrics - 08/09/17 1633      Pre Biometrics   Height  5' 4.1" (1.628 m)    Weight  152 lb 6.4 oz (69.1 kg)    Waist Circumference  31 inches    Hip Circumference  39 inches    Waist to Hip Ratio  0.79 %    BMI (  Calculated)  26.08        Nutrition Therapy Plan and Nutrition Goals: Nutrition Therapy & Goals - 08/09/17 1434      Personal Nutrition Goals   Comments  Lose some weight. Learn how to manage to cook meals that can help with her shortness of breath.      Intervention Plan   Intervention  Prescribe, educate and counsel regarding individualized specific dietary modifications aiming towards targeted core components such as weight, hypertension, lipid management, diabetes, heart failure and other comorbidities.;Nutrition handout(s) given to patient.    Expected Outcomes  Short Term Goal: Understand basic principles of dietary content, such as calories, fat, sodium, cholesterol and nutrients.;Long Term Goal: Adherence to prescribed nutrition plan.       Nutrition Assessments: Nutrition Assessments - 08/09/17 1428      MEDFICTS Scores   Pre Score  24       Nutrition Goals Re-Evaluation:   Nutrition Goals Discharge (Final Nutrition Goals Re-Evaluation):   Psychosocial: Target Goals: Acknowledge presence or absence of significant depression and/or stress, maximize coping skills, provide positive support system. Participant is able to verbalize types and ability to use techniques and skills needed for reducing stress and depression.   Initial Review & Psychosocial Screening: Initial Psych Review & Screening - 08/09/17 1433      Initial Review   Current issues with  Current Anxiety/Panic      Family Dynamics   Good Support System?  Yes    Comments  She can turn to her brother for support.      Barriers   Psychosocial barriers to participate in program  The  patient should benefit from training in stress management and relaxation.      Screening Interventions   Interventions  Encouraged to exercise;To provide support and resources with identified psychosocial needs;Program counselor consult;Provide feedback about the scores to participant    Expected Outcomes  Short Term goal: Utilizing psychosocial counselor, staff and physician to assist with identification of specific Stressors or current issues interfering with healing process. Setting desired goal for each stressor or current issue identified.;Long Term Goal: Stressors or current issues are controlled or eliminated.;Short Term goal: Identification and review with participant of any Quality of Life or Depression concerns found by scoring the questionnaire.;Long Term goal: The participant improves quality of Life and PHQ9 Scores as seen by post scores and/or verbalization of changes       Quality of Life Scores:  Scores of 19 and below usually indicate a poorer quality of life in these areas.  A difference of  2-3 points is a clinically meaningful difference.  A difference of 2-3 points in the total score of the Quality of Life Index has been associated with significant improvement in overall quality of life, self-image, physical symptoms, and general health in studies assessing change in quality of life.  PHQ-9: Recent Review Flowsheet Data    Depression screen [facility] 2/9 08/09/2017 04/28/2016 01/01/2016 12/12/2015 11/12/2015   Decreased Interest 1 2 0 0 0   Down, Depressed, Hopeless 0 1 0 0 0   PHQ - 2 Score 1 3 0 0 0   Altered sleeping 2 2 - - -   Tired, decreased energy 2 3 - - -   Change in appetite 3 3 - - -   Feeling bad or failure about yourself  3 1 - - -   Trouble concentrating 0 2 - - -   Moving slowly or fidgety/restless 0 0 - - -  Suicidal thoughts 0 0 - - -   PHQ-9 Score 11 14 - - -   Difficult doing work/chores Very difficult Somewhat difficult - - -     Interpretation of Total  Score  Total Score Depression Severity:  1-4 = Minimal depression, 5-9 = Mild depression, 10-14 = Moderate depression, 15-19 = Moderately severe depression, 20-27 = Severe depression   Psychosocial Evaluation and Intervention:   Psychosocial Re-Evaluation:   Psychosocial Discharge (Final Psychosocial Re-Evaluation):   Education: Education Goals: Education classes will be provided on a weekly basis, covering required topics. Participant will state understanding/return demonstration of topics presented.  Learning Barriers/Preferences: Learning Barriers/Preferences - 08/09/17 1446      Learning Barriers/Preferences   Learning Barriers  Sight wears glasses    Learning Preferences  None       Education Topics:  Initial Evaluation Education: - Verbal, written and demonstration of respiratory meds, oximetry and breathing techniques. Instruction on use of nebulizers and MDIs and importance of monitoring MDI activations.   Pulmonary Rehab from 08/09/2017 in [facility] Cardiac and Pulmonary Rehab  Date  08/09/17  Educator  [facility]  Instruction Review Code  1- Verbalizes Understanding      General Nutrition Guidelines/Fats and Fiber: -Group instruction provided by verbal, written material, models and posters to present the general guidelines for heart healthy nutrition. Gives an explanation and review of dietary fats and fiber.   Controlling Sodium/Reading Food Labels: -Group verbal and written material supporting the discussion of sodium use in heart healthy nutrition. Review and explanation with models, verbal and written materials for utilization of the food label.   Pulmonary Rehab from 06/17/2016 in [facility] Cardiac and Pulmonary Rehab  Date  05/04/16  Educator  [facility]  Instruction Review Code (retired)  2- meets goals/outcomes      Exercise Physiology & General Exercise Guidelines: - Group verbal and written instruction with models to review the exercise physiology of the cardiovascular system  and associated critical values. Provides general exercise guidelines with specific guidelines to those with heart or lung disease.    Pulmonary Rehab from 06/17/2016 in [facility] Cardiac and Pulmonary Rehab  Date  06/10/16  Educator  [facility]  Instruction Review Code (retired)  2- meets goals/outcomes      Aerobic Exercise & Resistance Training: - Gives group verbal and written instruction on the various components of exercise. Focuses on aerobic and resistive training programs and the benefits of this training and how to safely progress through these programs.   Flexibility, Balance, Mind/Body Relaxation: Provides group verbal/written instruction on the benefits of flexibility and balance training, including mind/body exercise modes such as yoga, pilates and tai chi.  Demonstration and skill practice provided.   Stress and Anxiety: - Provides group verbal and written instruction about the health risks of elevated stress and causes of high stress.  Discuss the correlation between heart/lung disease and anxiety and treatment options. Review healthy ways to manage with stress and anxiety.   Depression: - Provides group verbal and written instruction on the correlation between heart/lung disease and depressed mood, treatment options, and the stigmas associated with seeking treatment.   Pulmonary Rehab from 06/17/2016 in [facility] Cardiac and Pulmonary Rehab  Date  05/11/16  Educator  [facility]  Instruction Review Code (retired)  2- meets goals/outcomes      Exercise & Equipment Safety: - Individual verbal instruction and demonstration of equipment use and safety with use of the equipment.   Pulmonary Rehab from 08/09/2017 in [facility] Cardiac and Pulmonary Rehab  Date  08/09/17  Educator  [facility]  Instruction Review Code  1- Verbalizes Understanding      Infection Prevention: - Provides verbal and written material to individual with discussion of infection control including proper hand washing and proper equipment  cleaning during exercise session.   Pulmonary Rehab from 08/09/2017 in [facility] Cardiac and Pulmonary Rehab  Date  08/09/17  Educator  [facility]  Instruction Review Code  1- Verbalizes Understanding      Falls Prevention: - Provides verbal and written material to individual with discussion of falls prevention and safety.   Pulmonary Rehab from 08/09/2017 in [facility] Cardiac and Pulmonary Rehab  Date  08/09/17  Educator  [facility]  Instruction Review Code  1- Verbalizes Understanding      Diabetes: - Individual verbal and written instruction to review signs/symptoms of diabetes, desired ranges of glucose level fasting, after meals and with exercise. Advice that pre and post exercise glucose checks will be done for 3 sessions at entry of program.   Chronic Lung Diseases: - Group verbal and written instruction to review updates, respiratory medications, advancements in procedures and treatments. Discuss use of supplemental oxygen including available portable oxygen systems, continuous and intermittent flow rates, concentrators, personal use and safety guidelines. Review proper use of inhaler and spacers. Provide informative websites for self-education.    Energy Conservation: - Provide group verbal and written instruction for methods to conserve energy, plan and organize activities. Instruct on pacing techniques, use of adaptive equipment and posture/positioning to relieve shortness of breath.   Triggers and Exacerbations: - Group verbal and written instruction to review types of environmental triggers and ways to prevent exacerbations. Discuss weather changes, air quality and the benefits of nasal washing. Review warning signs and symptoms to help prevent infections. Discuss techniques for effective airway clearance, coughing, and vibrations.   Pulmonary Rehab from 06/17/2016 in [facility] Cardiac and Pulmonary Rehab  Date  06/03/16  Educator  [facility]  Instruction Review Code (retired)  2- meets goals/outcomes       AED/CPR: - Group verbal and written instruction with the use of models to demonstrate the basic use of the AED with the basic ABC's of resuscitation.   Anatomy and Physiology of the Lungs: - Group verbal and written instruction with the use of models to provide basic lung anatomy and physiology related to function, structure and complications of lung disease.   Pulmonary Rehab from 06/17/2016 in [facility] Cardiac and Pulmonary Rehab  Date  05/06/16  Educator  [facility]  Instruction Review Code (retired)  2- meets Designer, fashion/clothing & Physiology of the Heart: - Group verbal and written instruction and models provide basic cardiac anatomy and physiology, with the coronary electrical and arterial systems. Review of Valvular disease and Heart Failure   Cardiac Medications: - Group verbal and written instruction to review commonly prescribed medications for heart disease. Reviews the medication, class of the drug, and side effects.   Know Your Numbers and Risk Factors: -Group verbal and written instruction about important numbers in your health.  Discussion of what are risk factors and how they play a role in the disease process.  Review of Cholesterol, Blood Pressure, Diabetes, and BMI and the role they play in your overall health.   Sleep Hygiene: -Provides group verbal and written instruction about how sleep can affect your health.  Define sleep hygiene, discuss sleep cycles and impact of sleep habits. Review good sleep hygiene tips.    Other: -Provides group and verbal  instruction on various topics (see comments)    Knowledge Questionnaire Score: Knowledge Questionnaire Score - 08/09/17 1432      Knowledge Questionnaire Score   Pre Score  14/18 reviewed with patient        Core Components/Risk Factors/Patient Goals at Admission: Personal Goals and Risk Factors at Admission - 08/09/17 1437      Core Components/Risk Factors/Patient Goals on Admission    Weight Management   Yes;Weight Loss    Intervention  Weight Management: Develop a combined nutrition and exercise program designed to reach desired caloric intake, while maintaining appropriate intake of nutrient and fiber, sodium and fats, and appropriate energy expenditure required for the weight goal.;Weight Management: Provide education and appropriate resources to help participant work on and attain dietary goals.;Weight Management/Obesity: Establish reasonable short term and long term weight goals.    Admit Weight  152 lb 6.4 oz (69.1 kg)    Goal Weight: Short Term  147 lb (66.7 kg)    Goal Weight: Long Term  140 lb (63.5 kg)    Expected Outcomes  Long Term: Adherence to nutrition and physical activity/exercise program aimed toward attainment of established weight goal;Short Term: Continue to assess and modify interventions until short term weight is achieved;Weight Maintenance: Understanding of the daily nutrition guidelines, which includes 25-35% calories from fat, 7% or less cal from saturated fats, less than '200mg'$  cholesterol, less than 1.5gm of sodium, & 5 or more servings of fruits and vegetables daily;Weight Loss: Understanding of general recommendations for a balanced deficit meal plan, which promotes 1-2 lb weight loss per week and includes a negative energy balance of [phone] kcal/d;Understanding recommendations for meals to include 15-35% energy as protein, 25-35% energy from fat, 35-60% energy from carbohydrates, less than '200mg'$  of dietary cholesterol, 20-35 gm of total fiber daily;Understanding of distribution of calorie intake throughout the day with the consumption of 4-5 meals/snacks    Improve shortness of breath with ADL's  Yes    Intervention  Provide education, individualized exercise plan and daily activity instruction to help decrease symptoms of SOB with activities of daily living.    Expected Outcomes  Short Term: Improve cardiorespiratory fitness to achieve a reduction of symptoms when performing  ADLs;Long Term: Be able to perform more ADLs without symptoms or delay the onset of symptoms       Core Components/Risk Factors/Patient Goals Review:    Core Components/Risk Factors/Patient Goals at Discharge (Final Review):    ITP Comments: ITP Comments    Row Name 08/09/17 1400 08/16/17 0822         ITP Comments  Medical Evaluation completed. Chart sent for review and changes to Dr. Emily Filbert Director of [place]. Diagnosis can be found in [facility] encounter 07/28/17.  30 day review completed. ITP sent to Dr. Emily Filbert Director of [place]. Continue with ITP unless changes are made by physician.         Comments: 30 day review

## 2017-08-20 DIAGNOSIS — M1990 Unspecified osteoarthritis, unspecified site: Secondary | ICD-10-CM | POA: Diagnosis not present

## 2017-08-20 DIAGNOSIS — Z87891 Personal history of nicotine dependence: Secondary | ICD-10-CM | POA: Diagnosis not present

## 2017-08-20 DIAGNOSIS — J449 Chronic obstructive pulmonary disease, unspecified: Secondary | ICD-10-CM

## 2017-08-20 DIAGNOSIS — I10 Essential (primary) hypertension: Secondary | ICD-10-CM | POA: Diagnosis not present

## 2017-08-20 DIAGNOSIS — F419 Anxiety disorder, unspecified: Secondary | ICD-10-CM | POA: Diagnosis not present

## 2017-08-20 DIAGNOSIS — Z79899 Other long term (current) drug therapy: Secondary | ICD-10-CM | POA: Diagnosis not present

## 2017-08-20 DIAGNOSIS — E785 Hyperlipidemia, unspecified: Secondary | ICD-10-CM | POA: Diagnosis not present

## 2017-08-20 DIAGNOSIS — Z7951 Long term (current) use of inhaled steroids: Secondary | ICD-10-CM | POA: Diagnosis not present

## 2017-08-20 DIAGNOSIS — Z7982 Long term (current) use of aspirin: Secondary | ICD-10-CM | POA: Diagnosis not present

## 2017-08-20 NOTE — Progress Notes (Signed)
Daily Session Note  Patient Details  Name: Bethany [name] MRN: [id number] Date of Birth: 1935-09-25 Referring Provider:     Pulmonary Rehab from 08/09/2017 in [facility] Cardiac and Pulmonary Rehab  Referring Provider  Tracie Harrier MD      Encounter Date: 08/20/2017  Check In: Session Check In - 08/20/17 1006      Check-In   Location  [facility]-Cardiac & Pulmonary Rehab    Staff Present  Renita Papa, RN BSN;Mandi [place], BS, PEC;Renan Danese [place]    Supervising physician immediately available to respond to emergencies  [name] immediately available ER MD    Physician(s)  Dr. Corky Downs and Burlene Arnt    Medication changes reported      No    Fall or balance concerns reported     No    Tobacco Cessation  No Change    Warm-up and Cool-down  Performed as group-led instruction    Resistance Training Performed  Yes    VAD Patient?  No      Pain Assessment   Currently in Pain?  No/denies          Social History   Tobacco Use  Smoking Status Former Smoker  . Packs/day: 1.50  . Years: 30.00  . Pack years: 45.00  . Types: Cigarettes  . Last attempt to quit: 10/06/2014  . Years since quitting: 2.8  Smokeless Tobacco Never Used    Goals Met:  Exercise tolerated well Personal goals reviewed Queuing for purse lip breathing No report of cardiac concerns or symptoms Strength training completed today  Goals Unmet:  Not Applicable  Comments: First full day of exercise!  Patient was oriented to gym and equipment including functions, settings, policies, and procedures.  Patient's individual exercise prescription and treatment plan were reviewed.  All starting workloads were established based on the results of the 6 minute walk test done at initial orientation visit.  The plan for exercise progression was also introduced and progression will be customized based on patient's performance and goals.   Dr. Emily Filbert is Medical Director for [employer] and  [name].

## 2017-08-23 DIAGNOSIS — Z87891 Personal history of nicotine dependence: Secondary | ICD-10-CM | POA: Diagnosis not present

## 2017-08-23 DIAGNOSIS — F419 Anxiety disorder, unspecified: Secondary | ICD-10-CM | POA: Diagnosis not present

## 2017-08-23 DIAGNOSIS — Z7982 Long term (current) use of aspirin: Secondary | ICD-10-CM | POA: Diagnosis not present

## 2017-08-23 DIAGNOSIS — Z7951 Long term (current) use of inhaled steroids: Secondary | ICD-10-CM | POA: Diagnosis not present

## 2017-08-23 DIAGNOSIS — E785 Hyperlipidemia, unspecified: Secondary | ICD-10-CM | POA: Diagnosis not present

## 2017-08-23 DIAGNOSIS — J449 Chronic obstructive pulmonary disease, unspecified: Secondary | ICD-10-CM | POA: Diagnosis not present

## 2017-08-23 DIAGNOSIS — M1990 Unspecified osteoarthritis, unspecified site: Secondary | ICD-10-CM | POA: Diagnosis not present

## 2017-08-23 DIAGNOSIS — Z79899 Other long term (current) drug therapy: Secondary | ICD-10-CM | POA: Diagnosis not present

## 2017-08-23 DIAGNOSIS — I10 Essential (primary) hypertension: Secondary | ICD-10-CM | POA: Diagnosis not present

## 2017-08-23 NOTE — Progress Notes (Signed)
Daily Session Note  Patient Details  Name: Bethany [name] MRN: [id number] Date of Birth: Oct 17, 1935 Referring Provider:     Pulmonary Rehab from 08/09/2017 in [facility] Cardiac and Pulmonary Rehab  Referring Provider  Tracie Harrier MD      Encounter Date: 08/23/2017  Check In: Session Check In - 08/23/17 1012      Check-In   Location  [facility]-Cardiac & Pulmonary Rehab    Staff Present  Earlean Shawl, BS, ACSM CEP, Exercise Physiologist;Amanda Oletta Darter, BA, ACSM CEP, Exercise Physiologist;Adria Costley Flavia Shipper    Supervising physician immediately available to respond to emergencies  [name] immediately available ER MD    Physician(s)  Dr. Burlene Arnt and Corky Downs    Medication changes reported      No    Fall or balance concerns reported     No    Tobacco Cessation  No Change    Warm-up and Cool-down  Performed as group-led instruction    Resistance Training Performed  Yes    VAD Patient?  No      Pain Assessment   Currently in Pain?  No/denies          Social History   Tobacco Use  Smoking Status Former Smoker  . Packs/day: 1.50  . Years: 30.00  . Pack years: 45.00  . Types: Cigarettes  . Last attempt to quit: 10/06/2014  . Years since quitting: 2.8  Smokeless Tobacco Never Used    Goals Met:  Exercise tolerated well No report of cardiac concerns or symptoms Strength training completed today  Goals Unmet:  Not Applicable  Comments: Pt able to follow exercise prescription today without complaint.  Will continue to monitor for progression.   Dr. Emily Filbert is Medical Director for [place] and [name].

## 2017-08-25 DIAGNOSIS — J449 Chronic obstructive pulmonary disease, unspecified: Secondary | ICD-10-CM | POA: Diagnosis not present

## 2017-08-25 DIAGNOSIS — Z7982 Long term (current) use of aspirin: Secondary | ICD-10-CM | POA: Diagnosis not present

## 2017-08-25 DIAGNOSIS — E785 Hyperlipidemia, unspecified: Secondary | ICD-10-CM | POA: Diagnosis not present

## 2017-08-25 DIAGNOSIS — Z7951 Long term (current) use of inhaled steroids: Secondary | ICD-10-CM | POA: Diagnosis not present

## 2017-08-25 DIAGNOSIS — F419 Anxiety disorder, unspecified: Secondary | ICD-10-CM | POA: Diagnosis not present

## 2017-08-25 DIAGNOSIS — Z79899 Other long term (current) drug therapy: Secondary | ICD-10-CM | POA: Diagnosis not present

## 2017-08-25 DIAGNOSIS — I10 Essential (primary) hypertension: Secondary | ICD-10-CM | POA: Diagnosis not present

## 2017-08-25 DIAGNOSIS — Z87891 Personal history of nicotine dependence: Secondary | ICD-10-CM | POA: Diagnosis not present

## 2017-08-25 DIAGNOSIS — M1990 Unspecified osteoarthritis, unspecified site: Secondary | ICD-10-CM | POA: Diagnosis not present

## 2017-08-25 NOTE — Progress Notes (Signed)
Daily Session Note  Patient Details  Name: Bethany [name] MRN: [id number] Date of Birth: Aug 10, 1935 Referring Provider:     Pulmonary Rehab from 08/09/2017 in [facility] Cardiac and Pulmonary Rehab  Referring Provider  Tracie Harrier MD      Encounter Date: 08/25/2017  Check In: Session Check In - 08/25/17 0945      Check-In   Location  [facility]-Cardiac & Pulmonary Rehab    Staff Present  Justin Mend RCP,RRT,BSRT;Amanda Oletta Darter, BA, ACSM CEP, Exercise Physiologist;Jessica Luan Pulling, MA, RCEP, CCRP, Exercise Physiologist    Supervising physician immediately available to respond to emergencies  [name] immediately available ER MD    Physician(s)  Dr. Joni Fears and Jimmye Norman    Medication changes reported      No    Fall or balance concerns reported     No    Tobacco Cessation  No Change    Warm-up and Cool-down  Performed as group-led instruction    Resistance Training Performed  Yes    VAD Patient?  No      Pain Assessment   Currently in Pain?  No/denies          Social History   Tobacco Use  Smoking Status Former Smoker  . Packs/day: 1.50  . Years: 30.00  . Pack years: 45.00  . Types: Cigarettes  . Last attempt to quit: 10/06/2014  . Years since quitting: 2.8  Smokeless Tobacco Never Used    Goals Met:  Independence with exercise equipment Exercise tolerated well No report of cardiac concerns or symptoms Strength training completed today  Goals Unmet:  Not Applicable  Comments: Pt able to follow exercise prescription today without complaint.  Will continue to monitor for progression.   Dr. Emily Filbert is Medical Director for [place] and [name].

## 2017-08-26 DIAGNOSIS — I25118 Atherosclerotic heart disease of native coronary artery with other forms of angina pectoris: Secondary | ICD-10-CM | POA: Diagnosis not present

## 2017-08-27 ENCOUNTER — Encounter: Payer: Medicare HMO | Admitting: *Deleted

## 2017-08-27 DIAGNOSIS — J449 Chronic obstructive pulmonary disease, unspecified: Secondary | ICD-10-CM | POA: Diagnosis not present

## 2017-08-27 DIAGNOSIS — Z87891 Personal history of nicotine dependence: Secondary | ICD-10-CM | POA: Diagnosis not present

## 2017-08-27 DIAGNOSIS — M1990 Unspecified osteoarthritis, unspecified site: Secondary | ICD-10-CM | POA: Diagnosis not present

## 2017-08-27 DIAGNOSIS — F419 Anxiety disorder, unspecified: Secondary | ICD-10-CM | POA: Diagnosis not present

## 2017-08-27 DIAGNOSIS — Z79899 Other long term (current) drug therapy: Secondary | ICD-10-CM | POA: Diagnosis not present

## 2017-08-27 DIAGNOSIS — E785 Hyperlipidemia, unspecified: Secondary | ICD-10-CM | POA: Diagnosis not present

## 2017-08-27 DIAGNOSIS — I10 Essential (primary) hypertension: Secondary | ICD-10-CM | POA: Diagnosis not present

## 2017-08-27 DIAGNOSIS — Z7951 Long term (current) use of inhaled steroids: Secondary | ICD-10-CM | POA: Diagnosis not present

## 2017-08-27 DIAGNOSIS — Z7982 Long term (current) use of aspirin: Secondary | ICD-10-CM | POA: Diagnosis not present

## 2017-08-27 NOTE — Progress Notes (Signed)
Daily Session Note  Patient Details  Name: Bethany [name] MRN: [id number] Date of Birth: July 04, 1935 Referring Provider:     Pulmonary Rehab from 08/09/2017 in [facility] Cardiac and Pulmonary Rehab  Referring Provider  Tracie Harrier MD      Encounter Date: 08/27/2017  Check In: Session Check In - 08/27/17 1008      Check-In   Location  [facility]-Cardiac & Pulmonary Rehab    Staff Present  Renita Papa, RN Vickki Hearing, BA, ACSM CEP, Exercise Physiologist;Joseph Flavia Shipper    Supervising physician immediately available to respond to emergencies  [name] immediately available ER MD    Physician(s)  Drs. Joni Fears and [place]    Medication changes reported      No    Fall or balance concerns reported     No    Warm-up and Cool-down  Performed as group-led Higher education careers adviser Performed  Yes    VAD Patient?  No      Pain Assessment   Currently in Pain?  No/denies          Social History   Tobacco Use  Smoking Status Former Smoker  . Packs/day: 1.50  . Years: 30.00  . Pack years: 45.00  . Types: Cigarettes  . Last attempt to quit: 10/06/2014  . Years since quitting: 2.8  Smokeless Tobacco Never Used    Goals Met:  Proper associated with RPD/PD & O2 Sat Independence with exercise equipment Using PLB without cueing & demonstrates good technique Exercise tolerated well No report of cardiac concerns or symptoms Strength training completed today  Goals Unmet:  Not Applicable  Comments: Pt able to follow exercise prescription today without complaint.  Will continue to monitor for progression.   Dr. Emily Filbert is Medical Director for [place] and [name].

## 2017-08-29 DIAGNOSIS — J449 Chronic obstructive pulmonary disease, unspecified: Secondary | ICD-10-CM | POA: Diagnosis not present

## 2017-08-29 DIAGNOSIS — I10 Essential (primary) hypertension: Secondary | ICD-10-CM | POA: Diagnosis not present

## 2017-08-29 DIAGNOSIS — I82409 Acute embolism and thrombosis of unspecified deep veins of unspecified lower extremity: Secondary | ICD-10-CM | POA: Diagnosis not present

## 2017-08-29 DIAGNOSIS — J9620 Acute and chronic respiratory failure, unspecified whether with hypoxia or hypercapnia: Secondary | ICD-10-CM | POA: Diagnosis not present

## 2017-08-30 DIAGNOSIS — Z7951 Long term (current) use of inhaled steroids: Secondary | ICD-10-CM | POA: Diagnosis not present

## 2017-08-30 DIAGNOSIS — M1990 Unspecified osteoarthritis, unspecified site: Secondary | ICD-10-CM | POA: Diagnosis not present

## 2017-08-30 DIAGNOSIS — I10 Essential (primary) hypertension: Secondary | ICD-10-CM | POA: Diagnosis not present

## 2017-08-30 DIAGNOSIS — Z87891 Personal history of nicotine dependence: Secondary | ICD-10-CM | POA: Diagnosis not present

## 2017-08-30 DIAGNOSIS — Z79899 Other long term (current) drug therapy: Secondary | ICD-10-CM | POA: Diagnosis not present

## 2017-08-30 DIAGNOSIS — Z7982 Long term (current) use of aspirin: Secondary | ICD-10-CM | POA: Diagnosis not present

## 2017-08-30 DIAGNOSIS — J449 Chronic obstructive pulmonary disease, unspecified: Secondary | ICD-10-CM

## 2017-08-30 DIAGNOSIS — F419 Anxiety disorder, unspecified: Secondary | ICD-10-CM | POA: Diagnosis not present

## 2017-08-30 DIAGNOSIS — E785 Hyperlipidemia, unspecified: Secondary | ICD-10-CM | POA: Diagnosis not present

## 2017-08-30 NOTE — Progress Notes (Signed)
Daily Session Note  Patient Details  Name: Bethany [name] MRN: [id number] Date of Birth: [birth year] Referring Provider:     Pulmonary Rehab from 08/09/2017 in [facility] Cardiac and Pulmonary Rehab  Referring Provider  Tracie Harrier MD      Encounter Date: 08/30/2017  Check In: Session Check In - 08/30/17 1011      Check-In   Location  [facility]-Cardiac & Pulmonary Rehab    Staff Present  Earlean Shawl, BS, ACSM CEP, Exercise Physiologist;Amanda Oletta Darter, BA, ACSM CEP, Exercise Physiologist;Natosha Bou Flavia Shipper    Supervising physician immediately available to respond to emergencies  [name] immediately available ER MD    Physician(s)  Dr. Alfred Levins and Siadecki    Medication changes reported      No    Fall or balance concerns reported     No    Tobacco Cessation  No Change    Warm-up and Cool-down  Performed as group-led instruction    Resistance Training Performed  Yes    VAD Patient?  No      Pain Assessment   Currently in Pain?  No/denies          Social History   Tobacco Use  Smoking Status Former Smoker  . Packs/day: 1.50  . Years: 30.00  . Pack years: 45.00  . Types: Cigarettes  . Last attempt to quit: 10/06/2014  . Years since quitting: 2.9  Smokeless Tobacco Never Used    Goals Met:  Independence with exercise equipment Exercise tolerated well No report of cardiac concerns or symptoms Strength training completed today  Goals Unmet:  Not Applicable  Comments: Pt able to follow exercise prescription today without complaint.  Will continue to monitor for progression.   Dr. Emily Filbert is Medical Director for [place] and [name].

## 2017-09-01 DIAGNOSIS — Z87891 Personal history of nicotine dependence: Secondary | ICD-10-CM | POA: Diagnosis not present

## 2017-09-01 DIAGNOSIS — J449 Chronic obstructive pulmonary disease, unspecified: Secondary | ICD-10-CM | POA: Diagnosis not present

## 2017-09-01 DIAGNOSIS — Z79899 Other long term (current) drug therapy: Secondary | ICD-10-CM | POA: Diagnosis not present

## 2017-09-01 DIAGNOSIS — E785 Hyperlipidemia, unspecified: Secondary | ICD-10-CM | POA: Diagnosis not present

## 2017-09-01 DIAGNOSIS — R0902 Hypoxemia: Secondary | ICD-10-CM | POA: Diagnosis not present

## 2017-09-01 DIAGNOSIS — F419 Anxiety disorder, unspecified: Secondary | ICD-10-CM | POA: Diagnosis not present

## 2017-09-01 DIAGNOSIS — Z7951 Long term (current) use of inhaled steroids: Secondary | ICD-10-CM | POA: Diagnosis not present

## 2017-09-01 DIAGNOSIS — I10 Essential (primary) hypertension: Secondary | ICD-10-CM | POA: Diagnosis not present

## 2017-09-01 DIAGNOSIS — Z7982 Long term (current) use of aspirin: Secondary | ICD-10-CM | POA: Diagnosis not present

## 2017-09-01 DIAGNOSIS — M1990 Unspecified osteoarthritis, unspecified site: Secondary | ICD-10-CM | POA: Diagnosis not present

## 2017-09-01 DIAGNOSIS — J9610 Chronic respiratory failure, unspecified whether with hypoxia or hypercapnia: Secondary | ICD-10-CM | POA: Diagnosis not present

## 2017-09-01 NOTE — Progress Notes (Signed)
Daily Session Note  Patient Details  Name: KAISHA WACHOB MRN: [id number] Date of Birth: Jun 19, 1935 Referring Provider:     Pulmonary Rehab from 08/09/2017 in [facility] Cardiac and Pulmonary Rehab  Referring Provider  Tracie Harrier MD      Encounter Date: 09/01/2017  Check In: Session Check In - 09/01/17 1022      Check-In   Location  [facility]-Cardiac & Pulmonary Rehab    Staff Present  Alberteen Sam, MA, RCEP, CCRP, Exercise Physiologist;Amanda Oletta Darter, [place], ACSM CEP, Exercise Physiologist;Trent Gabler Flavia Shipper    Supervising physician immediately available to respond to emergencies  [name] immediately available ER MD    Physician(s)  Dr. Mariea Clonts and Jimmye Norman    Medication changes reported      No    Fall or balance concerns reported     No    Tobacco Cessation  No Change    Warm-up and Cool-down  Performed as group-led instruction    Resistance Training Performed  Yes    VAD Patient?  No      Pain Assessment   Currently in Pain?  No/denies          Social History   Tobacco Use  Smoking Status Former Smoker  . Packs/day: 1.50  . Years: 30.00  . Pack years: 45.00  . Types: Cigarettes  . Last attempt to quit: 10/06/2014  . Years since quitting: 2.9  Smokeless Tobacco Never Used    Goals Met:  Independence with exercise equipment Exercise tolerated well No report of cardiac concerns or symptoms Strength training completed today  Goals Unmet:  Not Applicable  Comments: Pt able to follow exercise prescription today without complaint.  Will continue to monitor for progression.   Dr. Emily Filbert is Medical Director for [place] and [name].

## 2017-09-02 DIAGNOSIS — I6522 Occlusion and stenosis of left carotid artery: Secondary | ICD-10-CM | POA: Diagnosis not present

## 2017-09-03 DIAGNOSIS — I700 Atherosclerosis of aorta: Secondary | ICD-10-CM | POA: Diagnosis not present

## 2017-09-03 DIAGNOSIS — I25118 Atherosclerotic heart disease of native coronary artery with other forms of angina pectoris: Secondary | ICD-10-CM | POA: Diagnosis not present

## 2017-09-06 ENCOUNTER — Encounter: Payer: Medicare HMO | Attending: Internal Medicine | Admitting: *Deleted

## 2017-09-06 DIAGNOSIS — Z7951 Long term (current) use of inhaled steroids: Secondary | ICD-10-CM | POA: Insufficient documentation

## 2017-09-06 DIAGNOSIS — E785 Hyperlipidemia, unspecified: Secondary | ICD-10-CM | POA: Diagnosis not present

## 2017-09-06 DIAGNOSIS — Z79899 Other long term (current) drug therapy: Secondary | ICD-10-CM | POA: Diagnosis not present

## 2017-09-06 DIAGNOSIS — J449 Chronic obstructive pulmonary disease, unspecified: Secondary | ICD-10-CM | POA: Diagnosis not present

## 2017-09-06 DIAGNOSIS — I10 Essential (primary) hypertension: Secondary | ICD-10-CM | POA: Insufficient documentation

## 2017-09-06 DIAGNOSIS — Z7982 Long term (current) use of aspirin: Secondary | ICD-10-CM | POA: Insufficient documentation

## 2017-09-06 DIAGNOSIS — M1990 Unspecified osteoarthritis, unspecified site: Secondary | ICD-10-CM | POA: Insufficient documentation

## 2017-09-06 DIAGNOSIS — F419 Anxiety disorder, unspecified: Secondary | ICD-10-CM | POA: Insufficient documentation

## 2017-09-06 DIAGNOSIS — Z87891 Personal history of nicotine dependence: Secondary | ICD-10-CM | POA: Diagnosis not present

## 2017-09-06 NOTE — Progress Notes (Signed)
Daily Session Note  Patient Details  Name: Bethany [name] MRN: [id number] Date of Birth: [birth year] Referring Provider:     Pulmonary Rehab from 08/09/2017 in [facility] Cardiac and Pulmonary Rehab  Referring Provider  Tracie Harrier MD      Encounter Date: 09/06/2017  Check In: Session Check In - 09/06/17 1019      Check-In   Location  [facility]-Cardiac & Pulmonary Rehab    Staff Present  Earlean Shawl, BS, ACSM CEP, Exercise Physiologist;Amanda Oletta Darter, BA, ACSM CEP, Exercise Physiologist;Joseph Flavia Shipper    Supervising physician immediately available to respond to emergencies  [name] immediately available ER MD    Physician(s)  Drs. Kinner and [place]     Medication changes reported      No    Fall or balance concerns reported     No    Warm-up and Cool-down  Performed as group-led Higher education careers adviser Performed  Yes    VAD Patient?  No      Pain Assessment   Currently in Pain?  No/denies    Multiple Pain Sites  No          Social History   Tobacco Use  Smoking Status Former Smoker  . Packs/day: 1.50  . Years: 30.00  . Pack years: 45.00  . Types: Cigarettes  . Last attempt to quit: 10/06/2014  . Years since quitting: 2.9  Smokeless Tobacco Never Used    Goals Met:  Proper associated with RPD/PD & O2 Sat Independence with exercise equipment Exercise tolerated well No report of cardiac concerns or symptoms Strength training completed today  Goals Unmet:  Not Applicable  Comments: Pt able to follow exercise prescription today without complaint.  Will continue to monitor for progression.    Dr. Emily Filbert is Medical Director for [place] and [name].

## 2017-09-08 DIAGNOSIS — M5136 Other intervertebral disc degeneration, lumbar region: Secondary | ICD-10-CM | POA: Diagnosis not present

## 2017-09-08 DIAGNOSIS — M5416 Radiculopathy, lumbar region: Secondary | ICD-10-CM | POA: Diagnosis not present

## 2017-09-09 DIAGNOSIS — I10 Essential (primary) hypertension: Secondary | ICD-10-CM | POA: Diagnosis not present

## 2017-09-09 DIAGNOSIS — E7800 Pure hypercholesterolemia, unspecified: Secondary | ICD-10-CM | POA: Diagnosis not present

## 2017-09-09 DIAGNOSIS — I2510 Atherosclerotic heart disease of native coronary artery without angina pectoris: Secondary | ICD-10-CM | POA: Diagnosis not present

## 2017-09-09 DIAGNOSIS — I6523 Occlusion and stenosis of bilateral carotid arteries: Secondary | ICD-10-CM | POA: Diagnosis not present

## 2017-09-09 DIAGNOSIS — R0602 Shortness of breath: Secondary | ICD-10-CM | POA: Diagnosis not present

## 2017-09-13 DIAGNOSIS — E785 Hyperlipidemia, unspecified: Secondary | ICD-10-CM | POA: Diagnosis not present

## 2017-09-13 DIAGNOSIS — Z7982 Long term (current) use of aspirin: Secondary | ICD-10-CM | POA: Diagnosis not present

## 2017-09-13 DIAGNOSIS — M1990 Unspecified osteoarthritis, unspecified site: Secondary | ICD-10-CM | POA: Diagnosis not present

## 2017-09-13 DIAGNOSIS — J449 Chronic obstructive pulmonary disease, unspecified: Secondary | ICD-10-CM | POA: Diagnosis not present

## 2017-09-13 DIAGNOSIS — F419 Anxiety disorder, unspecified: Secondary | ICD-10-CM | POA: Diagnosis not present

## 2017-09-13 DIAGNOSIS — Z87891 Personal history of nicotine dependence: Secondary | ICD-10-CM | POA: Diagnosis not present

## 2017-09-13 DIAGNOSIS — Z79899 Other long term (current) drug therapy: Secondary | ICD-10-CM | POA: Diagnosis not present

## 2017-09-13 DIAGNOSIS — Z7951 Long term (current) use of inhaled steroids: Secondary | ICD-10-CM | POA: Diagnosis not present

## 2017-09-13 DIAGNOSIS — I10 Essential (primary) hypertension: Secondary | ICD-10-CM | POA: Diagnosis not present

## 2017-09-13 NOTE — Progress Notes (Signed)
Pulmonary Individual Treatment Plan  Patient Details  Name: Bethany [name] MRN: [id number] Date of Birth: 01-Oct-1935 Referring Provider:     Pulmonary Rehab from 08/09/2017 in [facility] Cardiac and Pulmonary Rehab  Referring Provider  Tracie Harrier MD      Initial Encounter Date:    Pulmonary Rehab from 08/09/2017 in [facility] Cardiac and Pulmonary Rehab  Date  08/09/17  Referring Provider  Tracie Harrier MD      Visit Diagnosis: Chronic obstructive pulmonary disease, unspecified COPD type ([facility])  Patient's Home Medications on Admission:  Current Outpatient Medications:  .  albuterol (PROVENTIL HFA;VENTOLIN HFA) 108 (90 Base) MCG/ACT inhaler, Inhale 2 puffs into the lungs every 6 (six) hours as needed for wheezing or shortness of breath., Disp: 1 Inhaler, Rfl: 2 .  aspirin (ASPIRIN EC) 81 MG EC tablet, Take 81 mg by mouth daily. Swallow whole., Disp: , Rfl:  .  baclofen (LIORESAL) 10 MG tablet, Take 10 mg by mouth 3 (three) times daily., Disp: , Rfl:  .  CALCIUM CARBONATE PO, Take 1 tablet by mouth daily., Disp: , Rfl:  .  cetirizine (ZYRTEC) 10 MG tablet, Take 10 mg by mouth daily., Disp: , Rfl:  .  Cholecalciferol (VITAMIN D3) 1000 units CAPS, Take 1 capsule by mouth daily., Disp: , Rfl:  .  diazepam (VALIUM) 5 MG tablet, Take 5 mg by mouth at bedtime. Patient takes once at night Reported on 10/16/2015, Disp: , Rfl:  .  docusate sodium (COLACE) 100 MG capsule, Take 100 mg by mouth 2 (two) times daily as needed for mild constipation., Disp: , Rfl:  .  escitalopram (LEXAPRO) 5 MG tablet, Take 10 mg by mouth daily. , Disp: , Rfl:  .  Fluticasone-Salmeterol (ADVAIR) 250-50 MCG/DOSE AEPB, Inhale 1 puff into the lungs 2 (two) times daily., Disp: , Rfl:  .  gabapentin (NEURONTIN) 300 MG capsule, Take 300 mg by mouth 3 (three) times daily., Disp: , Rfl:  .  HYDROcodone-acetaminophen (NORCO/VICODIN) 5-325 MG tablet, Take 1 tablet by mouth 5 (five) times daily as needed for moderate pain. Do not  exceed 5 times daily, patient at least 3 day, Disp: , Rfl:  .  losartan (COZAAR) 50 MG tablet, Take 50 mg by mouth daily., Disp: , Rfl:  .  lovastatin (MEVACOR) 20 MG tablet, Take 20 mg by mouth at bedtime. Reported on 12/12/2015, Disp: , Rfl:  .  metoprolol succinate (TOPROL-XL) 25 MG 24 hr tablet, Take 25 mg by mouth daily., Disp: , Rfl:  .  montelukast (SINGULAIR) 10 MG tablet, Take 10 mg by mouth at bedtime. Reported on 12/12/2015, Disp: , Rfl:  .  tamsulosin (FLOMAX) 0.4 MG CAPS capsule, Take 0.4 mg by mouth daily after supper., Disp: , Rfl:  .  vitamin B-12 (CYANOCOBALAMIN) 1000 MCG tablet, Take 1,000 mcg by mouth daily., Disp: , Rfl:   Past Medical History: Past Medical History:  Diagnosis Date  . Allergy   . Anxiety   . Arthritis   . COPD (chronic obstructive pulmonary disease) ([place])   . Hyperlipidemia   . Hypertension   . Oxygen deficiency     Tobacco Use: Social History   Tobacco Use  Smoking Status Former Smoker  . Packs/day: 1.50  . Years: 30.00  . Pack years: 45.00  . Types: Cigarettes  . Last attempt to quit: 10/06/2014  . Years since quitting: 2.9  Smokeless Tobacco Never Used    Labs: Recent Review Flowsheet Data    There is no flowsheet data  to display.       Pulmonary Assessment Scores: Pulmonary Assessment Scores    Row Name 08/09/17 1425         ADL UCSD   ADL Phase  Entry     SOB Score total  63     Rest  0     Walk  3     Stairs  5     Bath  0     Dress  1     Shop  2       CAT Score   CAT Score  26       mMRC Score   mMRC Score  3        Pulmonary Function Assessment: Pulmonary Function Assessment - 08/09/17 1446      Initial Spirometry Results   FVC%  36 %    FEV1%  30 %    FEV1/FVC Ratio  62.78    Comments  Good patient effort      Post Bronchodilator Spirometry Results   FVC%  43.92 %    FEV1%  32.1 %    FEV1/FVC Ratio  55.17    Comments  Good patient effort      Breath   Bilateral Breath Sounds  Clear;Decreased     Shortness of Breath  Limiting activity;Panic with Shortness of Breath;Yes       Exercise Target Goals:    Exercise Program Goal: Individual exercise prescription set using results from initial 6 min walk test and THRR while considering  patient's activity barriers and safety.    Exercise Prescription Goal: Initial exercise prescription builds to 30-45 minutes a day of aerobic activity, 2-3 days per week.  Home exercise guidelines will be given to patient during program as part of exercise prescription that the participant will acknowledge.  Activity Barriers & Risk Stratification: Activity Barriers & Cardiac Risk Stratification - 08/09/17 1630      Activity Barriers & Cardiac Risk Stratification   Activity Barriers  Back Problems;Deconditioning;Muscular Weakness;Shortness of Breath 8+ surgeries on her back       6 Minute Walk: 6 Minute Walk    Row Name 08/09/17 1627         6 Minute Walk   Phase  Initial     Distance  680 feet     Walk Time  4.77 minutes     # of Rest Breaks  1 1:14     MPH  1.62     METS  2.03     RPE  17     Perceived Dyspnea   3     VO2 Peak  7.11     Symptoms  Yes (comment)     Comments  back pain 7/10, breathing became painful     Resting HR  76 bpm     Resting BP  146/64     Resting Oxygen Saturation   97 %     Exercise Oxygen Saturation  during 6 min walk  86 %     Max Ex. HR  115 bpm     Max Ex. BP  194/104     2 Minute Post BP  174/76 rck 154/70       Interval HR   1 Minute HR  109     2 Minute HR  115     3 Minute HR  110     4 Minute HR  98     5 Minute HR  112  6 Minute HR  115     2 Minute Post HR  82     Interval Heart Rate?  Yes       Interval Oxygen   Interval Oxygen?  Yes     Baseline Oxygen Saturation %  97 %     1 Minute Oxygen Saturation %  91 %     1 Minute Liters of Oxygen  3 L pulsed     2 Minute Oxygen Saturation %  89 %     2 Minute Liters of Oxygen  3 L     3 Minute Oxygen Saturation %  86 % rest break  2:26-3:40     3 Minute Liters of Oxygen  3 L     4 Minute Oxygen Saturation %  93 %     4 Minute Liters of Oxygen  3 L     5 Minute Oxygen Saturation %  89 %     5 Minute Liters of Oxygen  3 L     6 Minute Oxygen Saturation %  86 %     6 Minute Liters of Oxygen  3 L     2 Minute Post Oxygen Saturation %  96 %     2 Minute Post Liters of Oxygen  3 L       Oxygen Initial Assessment: Oxygen Initial Assessment - 08/09/17 1436      Home Oxygen   Home Oxygen Device  Portable Concentrator;Home Concentrator;E-Tanks    Sleep Oxygen Prescription  Continuous    Liters per minute  3    Home Exercise Oxygen Prescription  Continuous    Liters per minute  3    Home at Rest Exercise Oxygen Prescription  Continuous    Liters per minute  3      Initial 6 min Walk   Oxygen Used  Pulsed    Liters per minute  3      Program Oxygen Prescription   Program Oxygen Prescription  Continuous    Liters per minute  3      Intervention   Short Term Goals  To learn and exhibit compliance with exercise, home and travel O2 prescription;To learn and understand importance of maintaining oxygen saturations>88%;To learn and demonstrate proper use of respiratory medications;To learn and demonstrate proper pursed lip breathing techniques or other breathing techniques.;To learn and understand importance of monitoring SPO2 with pulse oximeter and demonstrate accurate use of the pulse oximeter.    Long  Term Goals  Exhibits compliance with exercise, home and travel O2 prescription;Verbalizes importance of monitoring SPO2 with pulse oximeter and return demonstration;Maintenance of O2 saturations>88%;Exhibits proper breathing techniques, such as pursed lip breathing or other method taught during program session;Compliance with respiratory medication;Demonstrates proper use of MDI's       Oxygen Re-Evaluation: Oxygen Re-Evaluation    Row Name 08/20/17 1008             Goals/Expected Outcomes   Short Term Goals   To learn and exhibit compliance with exercise, home and travel O2 prescription;To learn and understand importance of maintaining oxygen saturations>88%;To learn and demonstrate proper use of respiratory medications;To learn and demonstrate proper pursed lip breathing techniques or other breathing techniques.;To learn and understand importance of monitoring SPO2 with pulse oximeter and demonstrate accurate use of the pulse oximeter.       Long  Term Goals  Exhibits compliance with exercise, home and travel O2 prescription;Verbalizes importance of monitoring SPO2 with pulse oximeter and return  demonstration;Maintenance of O2 saturations>88%;Exhibits proper breathing techniques, such as pursed lip breathing or other method taught during program session;Compliance with respiratory medication;Demonstrates proper use of MDI's       Comments  Reviewed PLB technique with pt.  Talked about how it work and it's important to maintaining his exercise saturations.         Goals/Expected Outcomes  Short: Become more profiecient at using PLB.   Long: Become independent at using PLB.          Oxygen Discharge (Final Oxygen Re-Evaluation): Oxygen Re-Evaluation - 08/20/17 1008      Goals/Expected Outcomes   Short Term Goals  To learn and exhibit compliance with exercise, home and travel O2 prescription;To learn and understand importance of maintaining oxygen saturations>88%;To learn and demonstrate proper use of respiratory medications;To learn and demonstrate proper pursed lip breathing techniques or other breathing techniques.;To learn and understand importance of monitoring SPO2 with pulse oximeter and demonstrate accurate use of the pulse oximeter.    Long  Term Goals  Exhibits compliance with exercise, home and travel O2 prescription;Verbalizes importance of monitoring SPO2 with pulse oximeter and return demonstration;Maintenance of O2 saturations>88%;Exhibits proper breathing techniques, such as pursed lip  breathing or other method taught during program session;Compliance with respiratory medication;Demonstrates proper use of MDI's    Comments  Reviewed PLB technique with pt.  Talked about how it work and it's important to maintaining his exercise saturations.      Goals/Expected Outcomes  Short: Become more profiecient at using PLB.   Long: Become independent at using PLB.       Initial Exercise Prescription: Initial Exercise Prescription - 08/09/17 1600      Date of Initial Exercise RX and Referring Provider   Date  08/09/17    Referring Provider  Tracie Harrier MD      Oxygen   Oxygen  Continuous    Liters  3      Treadmill   MPH  1.3    Grade  0.5    Minutes  15    METs  2.09      NuStep   Level  1    SPM  80    Minutes  15    METs  2      Biostep-RELP   Level  1    SPM  50    Minutes  15    METs  2      Prescription Details   Frequency (times per week)  3    Duration  Progress to 45 minutes of aerobic exercise without signs/symptoms of physical distress      Intensity   THRR 40-80% of Max Heartrate  102-128    Ratings of Perceived Exertion  11-13    Perceived Dyspnea  0-4      Progression   Progression  Continue to progress workloads to maintain intensity without signs/symptoms of physical distress.      Resistance Training   Training Prescription  Yes    Weight  3 lbs    Reps  10-15       Perform Capillary Blood Glucose checks as needed.  Exercise Prescription Changes: Exercise Prescription Changes    Row Name 08/09/17 1600 09/01/17 1100           Response to Exercise   Blood Pressure (Admit)  146/64  142/74      Blood Pressure (Exercise)  194/104  -      Blood Pressure (Exit)  154/70  124/68      Heart Rate (Admit)  76 bpm  47 bpm      Heart Rate (Exercise)  115 bpm  88 bpm      Heart Rate (Exit)  82 bpm  72 bpm      Oxygen Saturation (Admit)  97 %  92 %      Oxygen Saturation (Exercise)  86 %  91 %      Oxygen Saturation (Exit)  96 %   96 %      Rating of Perceived Exertion (Exercise)  17  15      Perceived Dyspnea (Exercise)  3  3      Symptoms  back pain 7/10, painful breathing  -      Comments  walk test results  -      Duration  -  Progress to 45 minutes of aerobic exercise without signs/symptoms of physical distress      Intensity  -  THRR unchanged        Progression   Progression  -  Continue to progress workloads to maintain intensity without signs/symptoms of physical distress.      Average METs  -  1.75        Resistance Training   Training Prescription  -  Yes      Weight  -  3 lb      Reps  -  10-15        Interval Training   Interval Training  -  No        Oxygen   Oxygen  -  Continuous      Liters  -  3        Treadmill   MPH  -  1.3      Grade  -  0.5      Minutes  -  15      METs  -  2.09        NuStep   Level  -  3      SPM  -  80      Minutes  -  15      METs  -  1.5        Home Exercise Plan   Plans to continue exercise at  -  Home (comment) walk outside      Frequency  -  Add 1 additional day to program exercise sessions.      Initial Home Exercises Provided  -  09/01/17         Exercise Comments: Exercise Comments    Row Name 08/20/17 1007           Exercise Comments  First full day of exercise!  Patient was oriented to gym and equipment including functions, settings, policies, and procedures.  Patient's individual exercise prescription and treatment plan were reviewed.  All starting workloads were established based on the results of the 6 minute walk test done at initial orientation visit.  The plan for exercise progression was also introduced and progression will be customized based on patient's performance and goals.          Exercise Goals and Review: Exercise Goals    Row Name 08/09/17 1633             Exercise Goals   Increase Physical Activity  Yes       Intervention  Provide advice, education, support and counseling about physical activity/exercise  needs.;Develop an individualized exercise prescription for  aerobic and resistive training based on initial evaluation findings, risk stratification, comorbidities and participant's personal goals.       Expected Outcomes  Short Term: Attend rehab on a regular basis to increase amount of physical activity.;Long Term: Add in home exercise to make exercise part of routine and to increase amount of physical activity.;Long Term: Exercising regularly at least 3-5 days a week.       Increase Strength and Stamina  Yes       Intervention  Provide advice, education, support and counseling about physical activity/exercise needs.;Develop an individualized exercise prescription for aerobic and resistive training based on initial evaluation findings, risk stratification, comorbidities and participant's personal goals.       Expected Outcomes  Short Term: Increase workloads from initial exercise prescription for resistance, speed, and METs.;Short Term: Perform resistance training exercises routinely during rehab and add in resistance training at home;Long Term: Improve cardiorespiratory fitness, muscular endurance and strength as measured by increased METs and functional capacity (6MWT)       Able to understand and use rate of perceived exertion (RPE) scale  Yes       Intervention  Provide education and explanation on how to use RPE scale       Expected Outcomes  Short Term: Able to use RPE daily in rehab to express subjective intensity level;Long Term:  Able to use RPE to guide intensity level when exercising independently       Able to understand and use Dyspnea scale  Yes       Intervention  Provide education and explanation on how to use Dyspnea scale       Expected Outcomes  Short Term: Able to use Dyspnea scale daily in rehab to express subjective sense of shortness of breath during exertion;Long Term: Able to use Dyspnea scale to guide intensity level when exercising independently       Knowledge and  understanding of Target Heart Rate Range (THRR)  Yes       Intervention  Provide education and explanation of THRR including how the numbers were predicted and where they are located for reference       Expected Outcomes  Short Term: Able to state/look up THRR;Long Term: Able to use THRR to govern intensity when exercising independently;Short Term: Able to use daily as guideline for intensity in rehab       Able to check pulse independently  Yes       Intervention  Provide education and demonstration on how to check pulse in carotid and radial arteries.;Review the importance of being able to check your own pulse for safety during independent exercise       Expected Outcomes  Short Term: Able to explain why pulse checking is important during independent exercise;Long Term: Able to check pulse independently and accurately       Understanding of Exercise Prescription  Yes       Intervention  Provide education, explanation, and written materials on patient's individual exercise prescription       Expected Outcomes  Short Term: Able to explain program exercise prescription;Long Term: Able to explain home exercise prescription to exercise independently          Exercise Goals Re-Evaluation : Exercise Goals Re-Evaluation    Row Name 08/20/17 1007 09/01/17 1145           Exercise Goal Re-Evaluation   Exercise Goals Review  Understanding of Exercise Prescription;Able to understand and use Dyspnea scale;Knowledge and understanding of Target Heart Rate  Range (THRR);Able to understand and use rate of perceived exertion (RPE) scale  Increase Physical Activity;Able to understand and use rate of perceived exertion (RPE) scale;Increase Strength and Stamina;Able to understand and use Dyspnea scale;Knowledge and understanding of Target Heart Rate Range (THRR)      Comments  Reviewed RPE scale, THR and program prescription with pt today.  Pt voiced understanding and was given a copy of goals to take home.   Home  exercise reviewed with Pt including HR, RPE, O2.  Trezure still has back pain on TM.  Staff encouraged good posture and daily movement to strengthen core and back muscles.      Expected Outcomes  Short: Use RPE daily to regulate intensity.  Long: Follow program prescription in THR.  Short - Amiayah will walk one day at home in addition to program sessions [place] will be able to walk without back pain for 15 min on TM         Discharge Exercise Prescription (Final Exercise Prescription Changes): Exercise Prescription Changes - 09/01/17 1100      Response to Exercise   Blood Pressure (Admit)  142/74    Blood Pressure (Exit)  124/68    Heart Rate (Admit)  47 bpm    Heart Rate (Exercise)  88 bpm    Heart Rate (Exit)  72 bpm    Oxygen Saturation (Admit)  92 %    Oxygen Saturation (Exercise)  91 %    Oxygen Saturation (Exit)  96 %    Rating of Perceived Exertion (Exercise)  15    Perceived Dyspnea (Exercise)  3    Duration  Progress to 45 minutes of aerobic exercise without signs/symptoms of physical distress    Intensity  THRR unchanged      Progression   Progression  Continue to progress workloads to maintain intensity without signs/symptoms of physical distress.    Average METs  1.75      Resistance Training   Training Prescription  Yes    Weight  3 lb    Reps  10-15      Interval Training   Interval Training  No      Oxygen   Oxygen  Continuous    Liters  3      Treadmill   MPH  1.3    Grade  0.5    Minutes  15    METs  2.09      NuStep   Level  3    SPM  80    Minutes  15    METs  1.5      Home Exercise Plan   Plans to continue exercise at  Home (comment) walk outside    Frequency  Add 1 additional day to program exercise sessions.    Initial Home Exercises Provided  09/01/17       Nutrition:  Target Goals: Understanding of nutrition guidelines, daily intake of sodium <1553m, cholesterol <2053m calories 30% from fat and 7% or less from saturated  fats, daily to have 5 or more servings of fruits and vegetables.  Biometrics: Pre Biometrics - 08/09/17 1633      Pre Biometrics   Height  5' 4.1" (1.628 m)    Weight  152 lb 6.4 oz (69.1 kg)    Waist Circumference  31 inches    Hip Circumference  39 inches    Waist to Hip Ratio  0.79 %    BMI (Calculated)  26.08  Nutrition Therapy Plan and Nutrition Goals: Nutrition Therapy & Goals - 08/09/17 1434      Personal Nutrition Goals   Comments  Lose some weight. Learn how to manage to cook meals that can help with her shortness of breath.      Intervention Plan   Intervention  Prescribe, educate and counsel regarding individualized specific dietary modifications aiming towards targeted core components such as weight, hypertension, lipid management, diabetes, heart failure and other comorbidities.;Nutrition handout(s) given to patient.    Expected Outcomes  Short Term Goal: Understand basic principles of dietary content, such as calories, fat, sodium, cholesterol and nutrients.;Long Term Goal: Adherence to prescribed nutrition plan.       Nutrition Assessments: Nutrition Assessments - 08/09/17 1428      MEDFICTS Scores   Pre Score  24       Nutrition Goals Re-Evaluation:   Nutrition Goals Discharge (Final Nutrition Goals Re-Evaluation):   Psychosocial: Target Goals: Acknowledge presence or absence of significant depression and/or stress, maximize coping skills, provide positive support system. Participant is able to verbalize types and ability to use techniques and skills needed for reducing stress and depression.   Initial Review & Psychosocial Screening: Initial Psych Review & Screening - 08/09/17 1433      Initial Review   Current issues with  Current Anxiety/Panic      Family Dynamics   Good Support System?  Yes    Comments  She can turn to her brother for support.      Barriers   Psychosocial barriers to participate in program  The patient should benefit  from training in stress management and relaxation.      Screening Interventions   Interventions  Encouraged to exercise;To provide support and resources with identified psychosocial needs;Program counselor consult;Provide feedback about the scores to participant    Expected Outcomes  Short Term goal: Utilizing psychosocial counselor, staff and physician to assist with identification of specific Stressors or current issues interfering with healing process. Setting desired goal for each stressor or current issue identified.;Long Term Goal: Stressors or current issues are controlled or eliminated.;Short Term goal: Identification and review with participant of any Quality of Life or Depression concerns found by scoring the questionnaire.;Long Term goal: The participant improves quality of Life and PHQ9 Scores as seen by post scores and/or verbalization of changes       Quality of Life Scores:  Scores of 19 and below usually indicate a poorer quality of life in these areas.  A difference of  2-3 points is a clinically meaningful difference.  A difference of 2-3 points in the total score of the Quality of Life Index has been associated with significant improvement in overall quality of life, self-image, physical symptoms, and general health in studies assessing change in quality of life.  PHQ-9: Recent Review Flowsheet Data    Depression screen [facility] 2/9 08/09/2017 04/28/2016 01/01/2016 12/12/2015 11/12/2015   Decreased Interest 1 2 0 0 0   Down, Depressed, Hopeless 0 1 0 0 0   PHQ - 2 Score 1 3 0 0 0   Altered sleeping 2 2 - - -   Tired, decreased energy 2 3 - - -   Change in appetite 3 3 - - -   Feeling bad or failure about yourself  3 1 - - -   Trouble concentrating 0 2 - - -   Moving slowly or fidgety/restless 0 0 - - -   Suicidal thoughts 0 0 - - -  PHQ-9 Score 11 14 - - -   Difficult doing work/chores Very difficult Somewhat difficult - - -     Interpretation of Total Score  Total Score  Depression Severity:  1-4 = Minimal depression, 5-9 = Mild depression, 10-14 = Moderate depression, 15-19 = Moderately severe depression, 20-27 = Severe depression   Psychosocial Evaluation and Intervention: Psychosocial Evaluation - 08/23/17 1027      Psychosocial Evaluation & Interventions   Interventions  Encouraged to exercise with the program and follow exercise prescription    Comments  Ms Trevor Enid Derry) returned to Pulmonary Rehab recently due to continued difficulty breathing with the COPD.  Counselor met with her for initial psychosocial evaluation.  She has a strong support system with a brother close by; her local church and neighbors.  Annalissa reports sleeping well currently but continues to have a poor appetite.  She denies depression or anxiety symptoms or a history.  She has a PHQ-9 of "11" indicating moderate depression and discussed this with Enid Derry.  She states her mood is typically positive - it's just her health issues and shortness of breath that impact her quality of life.  Her goals are to breathe better and increase her energy while in this program - in order to return to normal activities.  Staff will follow with Enid Derry throughout the course of this program.      Expected Outcomes  Ceola will exercise consistently to achieve her stated goals.   The educational and psychoeducational components of this program will be helpful for understanding and managing her condition more positively.      Continue Psychosocial Services   Follow up required by staff       Psychosocial Re-Evaluation:   Psychosocial Discharge (Final Psychosocial Re-Evaluation):   Education: Education Goals: Education classes will be provided on a weekly basis, covering required topics. Participant will state understanding/return demonstration of topics presented.  Learning Barriers/Preferences: Learning Barriers/Preferences - 08/09/17 1446      Learning Barriers/Preferences   Learning Barriers   Sight wears glasses    Learning Preferences  None       Education Topics:  Initial Evaluation Education: - Verbal, written and demonstration of respiratory meds, oximetry and breathing techniques. Instruction on use of nebulizers and MDIs and importance of monitoring MDI activations.   Pulmonary Rehab from 09/06/2017 in [facility] Cardiac and Pulmonary Rehab  Date  08/09/17  Educator  [facility]  Instruction Review Code  1- Verbalizes Understanding      General Nutrition Guidelines/Fats and Fiber: -Group instruction provided by verbal, written material, models and posters to present the general guidelines for heart healthy nutrition. Gives an explanation and review of dietary fats and fiber.   Pulmonary Rehab from 09/06/2017 in [facility] Cardiac and Pulmonary Rehab  Date  08/30/17  Educator  CR  Instruction Review Code  1- Verbalizes Understanding      Controlling Sodium/Reading Food Labels: -Group verbal and written material supporting the discussion of sodium use in heart healthy nutrition. Review and explanation with models, verbal and written materials for utilization of the food label.   Pulmonary Rehab from 09/06/2017 in [facility] Cardiac and Pulmonary Rehab  Date  09/06/17  Educator  CR  Instruction Review Code  1- Verbalizes Understanding      Exercise Physiology & General Exercise Guidelines: - Group verbal and written instruction with models to review the exercise physiology of the cardiovascular system and associated critical values. Provides general exercise guidelines with specific guidelines to those with heart or  lung disease.    Pulmonary Rehab from 06/17/2016 in [facility] Cardiac and Pulmonary Rehab  Date  06/10/16  Educator  [facility]  Instruction Review Code (retired)  2- meets goals/outcomes      Aerobic Exercise & Resistance Training: - Gives group verbal and written instruction on the various components of exercise. Focuses on aerobic and resistive training programs and the benefits of this  training and how to safely progress through these programs.   Flexibility, Balance, Mind/Body Relaxation: Provides group verbal/written instruction on the benefits of flexibility and balance training, including mind/body exercise modes such as yoga, pilates and tai chi.  Demonstration and skill practice provided.   Stress and Anxiety: - Provides group verbal and written instruction about the health risks of elevated stress and causes of high stress.  Discuss the correlation between heart/lung disease and anxiety and treatment options. Review healthy ways to manage with stress and anxiety.   Depression: - Provides group verbal and written instruction on the correlation between heart/lung disease and depressed mood, treatment options, and the stigmas associated with seeking treatment.   Pulmonary Rehab from 06/17/2016 in [facility] Cardiac and Pulmonary Rehab  Date  05/11/16  Educator  [facility]  Instruction Review Code (retired)  2- meets goals/outcomes      Exercise & Equipment Safety: - Individual verbal instruction and demonstration of equipment use and safety with use of the equipment.   Pulmonary Rehab from 09/06/2017 in [facility] Cardiac and Pulmonary Rehab  Date  08/09/17  Educator  [facility]  Instruction Review Code  1- Verbalizes Understanding      Infection Prevention: - Provides verbal and written material to individual with discussion of infection control including proper hand washing and proper equipment cleaning during exercise session.   Pulmonary Rehab from 09/06/2017 in [facility] Cardiac and Pulmonary Rehab  Date  08/09/17  Educator  [facility]  Instruction Review Code  1- Verbalizes Understanding      Falls Prevention: - Provides verbal and written material to individual with discussion of falls prevention and safety.   Pulmonary Rehab from 09/06/2017 in [facility] Cardiac and Pulmonary Rehab  Date  08/09/17  Educator  [facility]  Instruction Review Code  1- Verbalizes Understanding      Diabetes: - Individual  verbal and written instruction to review signs/symptoms of diabetes, desired ranges of glucose level fasting, after meals and with exercise. Advice that pre and post exercise glucose checks will be done for 3 sessions at entry of program.   Chronic Lung Diseases: - Group verbal and written instruction to review updates, respiratory medications, advancements in procedures and treatments. Discuss use of supplemental oxygen including available portable oxygen systems, continuous and intermittent flow rates, concentrators, personal use and safety guidelines. Review proper use of inhaler and spacers. Provide informative websites for self-education.    Pulmonary Rehab from 09/06/2017 in [facility] Cardiac and Pulmonary Rehab  Date  09/01/17  Educator  [facility]  Instruction Review Code  1- Verbalizes Understanding      Energy Conservation: - Provide group verbal and written instruction for methods to conserve energy, plan and organize activities. Instruct on pacing techniques, use of adaptive equipment and posture/positioning to relieve shortness of breath.   Triggers and Exacerbations: - Group verbal and written instruction to review types of environmental triggers and ways to prevent exacerbations. Discuss weather changes, air quality and the benefits of nasal washing. Review warning signs and symptoms to help prevent infections. Discuss techniques for effective airway clearance, coughing, and vibrations.   Pulmonary Rehab from  06/17/2016 in [facility] Cardiac and Pulmonary Rehab  Date  06/03/16  Educator  [facility]  Instruction Review Code (retired)  2- meets goals/outcomes      AED/CPR: - Group verbal and written instruction with the use of models to demonstrate the basic use of the AED with the basic ABC's of resuscitation.   Anatomy and Physiology of the Lungs: - Group verbal and written instruction with the use of models to provide basic lung anatomy and physiology related to function, structure and complications  of lung disease.   Pulmonary Rehab from 06/17/2016 in [facility] Cardiac and Pulmonary Rehab  Date  05/06/16  Educator  [facility]  Instruction Review Code (retired)  2- meets Designer, fashion/clothing & Physiology of the Heart: - Group verbal and written instruction and models provide basic cardiac anatomy and physiology, with the coronary electrical and arterial systems. Review of Valvular disease and Heart Failure   Cardiac Medications: - Group verbal and written instruction to review commonly prescribed medications for heart disease. Reviews the medication, class of the drug, and side effects.   Know Your Numbers and Risk Factors: -Group verbal and written instruction about important numbers in your health.  Discussion of what are risk factors and how they play a role in the disease process.  Review of Cholesterol, Blood Pressure, Diabetes, and BMI and the role they play in your overall health.   Sleep Hygiene: -Provides group verbal and written instruction about how sleep can affect your health.  Define sleep hygiene, discuss sleep cycles and impact of sleep habits. Review good sleep hygiene tips.    Other: -Provides group and verbal instruction on various topics (see comments)    Knowledge Questionnaire Score: Knowledge Questionnaire Score - 08/09/17 1432      Knowledge Questionnaire Score   Pre Score  14/18 reviewed with patient        Core Components/Risk Factors/Patient Goals at Admission: Personal Goals and Risk Factors at Admission - 08/09/17 1437      Core Components/Risk Factors/Patient Goals on Admission    Weight Management  Yes;Weight Loss    Intervention  Weight Management: Develop a combined nutrition and exercise program designed to reach desired caloric intake, while maintaining appropriate intake of nutrient and fiber, sodium and fats, and appropriate energy expenditure required for the weight goal.;Weight Management: Provide education and appropriate resources to  help participant work on and attain dietary goals.;Weight Management/Obesity: Establish reasonable short term and long term weight goals.    Admit Weight  152 lb 6.4 oz (69.1 kg)    Goal Weight: Short Term  147 lb (66.7 kg)    Goal Weight: Long Term  140 lb (63.5 kg)    Expected Outcomes  Long Term: Adherence to nutrition and physical activity/exercise program aimed toward attainment of established weight goal;Short Term: Continue to assess and modify interventions until short term weight is achieved;Weight Maintenance: Understanding of the daily nutrition guidelines, which includes 25-35% calories from fat, 7% or less cal from saturated fats, less than 275m cholesterol, less than 1.5gm of sodium, & 5 or more servings of fruits and vegetables daily;Weight Loss: Understanding of general recommendations for a balanced deficit meal plan, which promotes 1-2 lb weight loss per week and includes a negative energy balance of [phone] kcal/d;Understanding recommendations for meals to include 15-35% energy as protein, 25-35% energy from fat, 35-60% energy from carbohydrates, less than 2041mof dietary cholesterol, 20-35 gm of total fiber daily;Understanding of distribution of calorie intake throughout the  day with the consumption of 4-5 meals/snacks    Improve shortness of breath with ADL's  Yes    Intervention  Provide education, individualized exercise plan and daily activity instruction to help decrease symptoms of SOB with activities of daily living.    Expected Outcomes  Short Term: Improve cardiorespiratory fitness to achieve a reduction of symptoms when performing ADLs;Long Term: Be able to perform more ADLs without symptoms or delay the onset of symptoms       Core Components/Risk Factors/Patient Goals Review:    Core Components/Risk Factors/Patient Goals at Discharge (Final Review):    ITP Comments: ITP Comments    Row Name 08/09/17 1400 08/16/17 0822 09/13/17 0827       ITP Comments  Medical  Evaluation completed. Chart sent for review and changes to Dr. Emily Filbert Director of [place]. Diagnosis can be found in [facility] encounter 07/28/17.  30 day review completed. ITP sent to Dr. Emily Filbert Director of [place]. Continue with ITP unless changes are made by physician.   30 day review completed. ITP sent to Dr. Emily Filbert Director of [place]. Continue with ITP unless changes are made by physician        Comments: 30 day review

## 2017-09-13 NOTE — Progress Notes (Signed)
Daily Session Note  Patient Details  Name: Bethany [name] MRN: [id number] Date of Birth: Jan 02, 1936 Referring Provider:     Pulmonary Rehab from 08/09/2017 in [facility] Cardiac and Pulmonary Rehab  Referring Provider  Tracie Harrier MD      Encounter Date: 09/13/2017  Check In: Session Check In - 09/13/17 0950      Check-In   Location  [facility]-Cardiac & Pulmonary Rehab    Staff Present  Justin Mend Jaci Carrel, BS, ACSM CEP, Exercise Physiologist;Amanda Oletta Darter, [place], ACSM CEP, Exercise Physiologist    Supervising physician immediately available to respond to emergencies  [name] immediately available ER MD    Physician(s)  Dr. Corky Downs and [facility]    Medication changes reported      No    Fall or balance concerns reported     No    Tobacco Cessation  No Change    Warm-up and Cool-down  Performed as group-led instruction    Resistance Training Performed  Yes    VAD Patient?  No      Pain Assessment   Currently in Pain?  No/denies          Social History   Tobacco Use  Smoking Status Former Smoker  . Packs/day: 1.50  . Years: 30.00  . Pack years: 45.00  . Types: Cigarettes  . Last attempt to quit: 10/06/2014  . Years since quitting: 2.9  Smokeless Tobacco Never Used    Goals Met:  Exercise tolerated well No report of cardiac concerns or symptoms Strength training completed today  Goals Unmet:  Not Applicable  Comments: Pt able to follow exercise prescription today without complaint.  Will continue to monitor for progression.   Dr. Emily Filbert is Medical Director for [place] and [name].

## 2017-09-15 DIAGNOSIS — E785 Hyperlipidemia, unspecified: Secondary | ICD-10-CM | POA: Diagnosis not present

## 2017-09-15 DIAGNOSIS — M1990 Unspecified osteoarthritis, unspecified site: Secondary | ICD-10-CM | POA: Diagnosis not present

## 2017-09-15 DIAGNOSIS — Z87891 Personal history of nicotine dependence: Secondary | ICD-10-CM | POA: Diagnosis not present

## 2017-09-15 DIAGNOSIS — J449 Chronic obstructive pulmonary disease, unspecified: Secondary | ICD-10-CM

## 2017-09-15 DIAGNOSIS — Z7951 Long term (current) use of inhaled steroids: Secondary | ICD-10-CM | POA: Diagnosis not present

## 2017-09-15 DIAGNOSIS — Z79899 Other long term (current) drug therapy: Secondary | ICD-10-CM | POA: Diagnosis not present

## 2017-09-15 DIAGNOSIS — Z7982 Long term (current) use of aspirin: Secondary | ICD-10-CM | POA: Diagnosis not present

## 2017-09-15 DIAGNOSIS — I10 Essential (primary) hypertension: Secondary | ICD-10-CM | POA: Diagnosis not present

## 2017-09-15 DIAGNOSIS — F419 Anxiety disorder, unspecified: Secondary | ICD-10-CM | POA: Diagnosis not present

## 2017-09-15 NOTE — Progress Notes (Signed)
Daily Session Note  Patient Details  Name: TEAGAN OZAWA MRN: [id number] Date of Birth: 1935-07-22 Referring Provider:     Pulmonary Rehab from 08/09/2017 in [facility] Cardiac and Pulmonary Rehab  Referring Provider  Tracie Harrier MD      Encounter Date: 09/15/2017  Check In: Session Check In - 09/15/17 1020      Check-In   Location  [facility]-Cardiac & Pulmonary Rehab    Staff Present  Alberteen Sam, MA, RCEP, CCRP, Exercise Physiologist;Nikolas Casher Oletta Darter, BA, ACSM CEP, Exercise Physiologist;Joseph Flavia Shipper    Supervising physician immediately available to respond to emergencies  [name] immediately available ER MD    Physician(s)  Jimmye Norman and Clearnce Hasten    Medication changes reported      No    Fall or balance concerns reported     No    Warm-up and Cool-down  Performed as group-led Higher education careers adviser Performed  Yes    VAD Patient?  No      Pain Assessment   Currently in Pain?  No/denies    Multiple Pain Sites  No          Social History   Tobacco Use  Smoking Status Former Smoker  . Packs/day: 1.50  . Years: 30.00  . Pack years: 45.00  . Types: Cigarettes  . Last attempt to quit: 10/06/2014  . Years since quitting: 2.9  Smokeless Tobacco Never Used    Goals Met:  Proper associated with RPD/PD & O2 Sat Independence with exercise equipment Exercise tolerated well Strength training completed today  Goals Unmet:  Not Applicable  Comments: Pt able to follow exercise prescription today without complaint.  Will continue to monitor for progression.    Dr. Emily Filbert is Medical Director for [place] and [name].

## 2017-09-22 DIAGNOSIS — Z7982 Long term (current) use of aspirin: Secondary | ICD-10-CM | POA: Diagnosis not present

## 2017-09-22 DIAGNOSIS — M1990 Unspecified osteoarthritis, unspecified site: Secondary | ICD-10-CM | POA: Diagnosis not present

## 2017-09-22 DIAGNOSIS — Z7951 Long term (current) use of inhaled steroids: Secondary | ICD-10-CM | POA: Diagnosis not present

## 2017-09-22 DIAGNOSIS — E785 Hyperlipidemia, unspecified: Secondary | ICD-10-CM | POA: Diagnosis not present

## 2017-09-22 DIAGNOSIS — J449 Chronic obstructive pulmonary disease, unspecified: Secondary | ICD-10-CM | POA: Diagnosis not present

## 2017-09-22 DIAGNOSIS — F419 Anxiety disorder, unspecified: Secondary | ICD-10-CM | POA: Diagnosis not present

## 2017-09-22 DIAGNOSIS — Z87891 Personal history of nicotine dependence: Secondary | ICD-10-CM | POA: Diagnosis not present

## 2017-09-22 DIAGNOSIS — Z79899 Other long term (current) drug therapy: Secondary | ICD-10-CM | POA: Diagnosis not present

## 2017-09-22 DIAGNOSIS — I10 Essential (primary) hypertension: Secondary | ICD-10-CM | POA: Diagnosis not present

## 2017-09-22 NOTE — Progress Notes (Signed)
Daily Session Note  Patient Details  Name: Bethany [name] MRN: [id number] Date of Birth: [birth year] Referring Provider:     Pulmonary Rehab from 08/09/2017 in [facility] Cardiac and Pulmonary Rehab  Referring Provider  Tracie Harrier MD      Encounter Date: 09/22/2017  Check In: Session Check In - 09/22/17 0940      Check-In   Location  [facility]-Cardiac & Pulmonary Rehab    Staff Present  Justin Mend RCP,RRT,BSRT;Amanda Oletta Darter, BA, ACSM CEP, Exercise Physiologist;Jessica Luan Pulling, MA, RCEP, CCRP, Exercise Physiologist    Supervising physician immediately available to respond to emergencies  [name] immediately available ER MD    Physician(s)  Dr. Owens Shark and Jimmye Norman    Medication changes reported      No    Fall or balance concerns reported     No    Tobacco Cessation  No Change    Warm-up and Cool-down  Performed as group-led instruction    Resistance Training Performed  Yes    VAD Patient?  No      Pain Assessment   Currently in Pain?  No/denies          Social History   Tobacco Use  Smoking Status Former Smoker  . Packs/day: 1.50  . Years: 30.00  . Pack years: 45.00  . Types: Cigarettes  . Last attempt to quit: 10/06/2014  . Years since quitting: 2.9  Smokeless Tobacco Never Used    Goals Met:  Independence with exercise equipment Exercise tolerated well No report of cardiac concerns or symptoms Strength training completed today  Goals Unmet:  Not Applicable  Comments: Pt able to follow exercise prescription today without complaint.  Will continue to monitor for progression.   Dr. Emily Filbert is Medical Director for [place] and [name].

## 2017-09-24 ENCOUNTER — Encounter: Payer: Medicare HMO | Admitting: *Deleted

## 2017-09-24 DIAGNOSIS — I10 Essential (primary) hypertension: Secondary | ICD-10-CM | POA: Diagnosis not present

## 2017-09-24 DIAGNOSIS — M1990 Unspecified osteoarthritis, unspecified site: Secondary | ICD-10-CM | POA: Diagnosis not present

## 2017-09-24 DIAGNOSIS — F419 Anxiety disorder, unspecified: Secondary | ICD-10-CM | POA: Diagnosis not present

## 2017-09-24 DIAGNOSIS — Z7982 Long term (current) use of aspirin: Secondary | ICD-10-CM | POA: Diagnosis not present

## 2017-09-24 DIAGNOSIS — J449 Chronic obstructive pulmonary disease, unspecified: Secondary | ICD-10-CM | POA: Diagnosis not present

## 2017-09-24 DIAGNOSIS — E785 Hyperlipidemia, unspecified: Secondary | ICD-10-CM | POA: Diagnosis not present

## 2017-09-24 DIAGNOSIS — Z87891 Personal history of nicotine dependence: Secondary | ICD-10-CM | POA: Diagnosis not present

## 2017-09-24 DIAGNOSIS — Z79899 Other long term (current) drug therapy: Secondary | ICD-10-CM | POA: Diagnosis not present

## 2017-09-24 DIAGNOSIS — Z7951 Long term (current) use of inhaled steroids: Secondary | ICD-10-CM | POA: Diagnosis not present

## 2017-09-24 NOTE — Progress Notes (Signed)
Daily Session Note  Patient Details  Name: Bethany [name] MRN: [id number] Date of Birth: 18-Sep-1935 Referring Provider:     Pulmonary Rehab from 08/09/2017 in [facility] Cardiac and Pulmonary Rehab  Referring Provider  Tracie Harrier MD      Encounter Date: 09/24/2017  Check In: Session Check In - 09/24/17 1000      Check-In   Location  [facility]-Cardiac & Pulmonary Rehab    Staff Present  Renita Papa, RN BSN;Navaeh Kehres Luan Pulling, MA, RCEP, CCRP, Exercise Physiologist;Mandi Zachery Conch, BS, [facility]    Supervising physician immediately available to respond to emergencies  [name] immediately available ER MD    Physician(s)  Drs. Owens Shark and Lord    Medication changes reported      No    Fall or balance concerns reported     No    Warm-up and Cool-down  Performed as group-led Higher education careers adviser Performed  Yes    VAD Patient?  No      Pain Assessment   Currently in Pain?  No/denies          Social History   Tobacco Use  Smoking Status Former Smoker  . Packs/day: 1.50  . Years: 30.00  . Pack years: 45.00  . Types: Cigarettes  . Last attempt to quit: 10/06/2014  . Years since quitting: 2.9  Smokeless Tobacco Never Used    Goals Met:  Proper associated with RPD/PD & O2 Sat Independence with exercise equipment Using PLB without cueing & demonstrates good technique Exercise tolerated well No report of cardiac concerns or symptoms Strength training completed today  Goals Unmet:  Not Applicable  Comments: Pt able to follow exercise prescription today without complaint.  Will continue to monitor for progression.    Dr. Emily Filbert is Medical Director for [place] and [name].

## 2017-09-29 DIAGNOSIS — I82409 Acute embolism and thrombosis of unspecified deep veins of unspecified lower extremity: Secondary | ICD-10-CM | POA: Diagnosis not present

## 2017-09-29 DIAGNOSIS — F419 Anxiety disorder, unspecified: Secondary | ICD-10-CM | POA: Diagnosis not present

## 2017-09-29 DIAGNOSIS — E785 Hyperlipidemia, unspecified: Secondary | ICD-10-CM | POA: Diagnosis not present

## 2017-09-29 DIAGNOSIS — I10 Essential (primary) hypertension: Secondary | ICD-10-CM | POA: Diagnosis not present

## 2017-09-29 DIAGNOSIS — Z79899 Other long term (current) drug therapy: Secondary | ICD-10-CM | POA: Diagnosis not present

## 2017-09-29 DIAGNOSIS — Z7951 Long term (current) use of inhaled steroids: Secondary | ICD-10-CM | POA: Diagnosis not present

## 2017-09-29 DIAGNOSIS — J449 Chronic obstructive pulmonary disease, unspecified: Secondary | ICD-10-CM | POA: Diagnosis not present

## 2017-09-29 DIAGNOSIS — Z7982 Long term (current) use of aspirin: Secondary | ICD-10-CM | POA: Diagnosis not present

## 2017-09-29 DIAGNOSIS — M1990 Unspecified osteoarthritis, unspecified site: Secondary | ICD-10-CM | POA: Diagnosis not present

## 2017-09-29 DIAGNOSIS — J9620 Acute and chronic respiratory failure, unspecified whether with hypoxia or hypercapnia: Secondary | ICD-10-CM | POA: Diagnosis not present

## 2017-09-29 DIAGNOSIS — Z87891 Personal history of nicotine dependence: Secondary | ICD-10-CM | POA: Diagnosis not present

## 2017-09-29 NOTE — Progress Notes (Signed)
Daily Session Note  Patient Details  Name: BETHANIE BLOXOM MRN: [id number] Date of Birth: Jul 24, 1935 Referring Provider:     Pulmonary Rehab from 08/09/2017 in [facility] Cardiac and Pulmonary Rehab  Referring Provider  Tracie Harrier MD      Encounter Date: 09/29/2017  Check In: Session Check In - 09/29/17 1012      Check-In   Location  [facility]-Cardiac & Pulmonary Rehab    Staff Present  Justin Mend RCP,RRT,BSRT;Amanda Oletta Darter, BA, ACSM CEP, Exercise Physiologist;Jessica Luan Pulling, MA, RCEP, CCRP, Exercise Physiologist    Supervising physician immediately available to respond to emergencies  [name] immediately available ER MD    Physician(s)  Dr. Archie Balboa and Joni Fears    Medication changes reported      No    Fall or balance concerns reported     No    Tobacco Cessation  No Change    Warm-up and Cool-down  Performed as group-led instruction    Resistance Training Performed  Yes    VAD Patient?  No      Pain Assessment   Currently in Pain?  No/denies          Social History   Tobacco Use  Smoking Status Former Smoker  . Packs/day: 1.50  . Years: 30.00  . Pack years: 45.00  . Types: Cigarettes  . Last attempt to quit: 10/06/2014  . Years since quitting: 2.9  Smokeless Tobacco Never Used    Goals Met:  Independence with exercise equipment Exercise tolerated well No report of cardiac concerns or symptoms Strength training completed today  Goals Unmet:  Not Applicable  Comments: Pt able to follow exercise prescription today without complaint.  Will continue to monitor for progression.   Dr. Emily Filbert is Medical Director for [place] and [name].

## 2017-10-01 DIAGNOSIS — F419 Anxiety disorder, unspecified: Secondary | ICD-10-CM | POA: Diagnosis not present

## 2017-10-01 DIAGNOSIS — J449 Chronic obstructive pulmonary disease, unspecified: Secondary | ICD-10-CM | POA: Diagnosis not present

## 2017-10-01 DIAGNOSIS — Z79899 Other long term (current) drug therapy: Secondary | ICD-10-CM | POA: Diagnosis not present

## 2017-10-01 DIAGNOSIS — I10 Essential (primary) hypertension: Secondary | ICD-10-CM | POA: Diagnosis not present

## 2017-10-01 DIAGNOSIS — E785 Hyperlipidemia, unspecified: Secondary | ICD-10-CM | POA: Diagnosis not present

## 2017-10-01 DIAGNOSIS — Z7982 Long term (current) use of aspirin: Secondary | ICD-10-CM | POA: Diagnosis not present

## 2017-10-01 DIAGNOSIS — Z87891 Personal history of nicotine dependence: Secondary | ICD-10-CM | POA: Diagnosis not present

## 2017-10-01 DIAGNOSIS — M1990 Unspecified osteoarthritis, unspecified site: Secondary | ICD-10-CM | POA: Diagnosis not present

## 2017-10-01 DIAGNOSIS — Z7951 Long term (current) use of inhaled steroids: Secondary | ICD-10-CM | POA: Diagnosis not present

## 2017-10-01 NOTE — Progress Notes (Signed)
Daily Session Note  Patient Details  Name: Bethany [name] MRN: [id number] Date of Birth: 1935-12-29 Referring Provider:     Pulmonary Rehab from 08/09/2017 in [facility] Cardiac and Pulmonary Rehab  Referring Provider  Tracie Harrier MD      Encounter Date: 10/01/2017  Check In: Session Check In - 10/01/17 1007      Check-In   Location  [facility]-Cardiac & Pulmonary Rehab    Staff Present  Renita Papa, RN Vickki Hearing, BA, ACSM CEP, Exercise Physiologist;Joseph Flavia Shipper    Supervising physician immediately available to respond to emergencies  [name] immediately available ER MD    Physician(s)  Dr. Jimmye Norman and Corky Downs    Medication changes reported      No    Fall or balance concerns reported     No    Tobacco Cessation  No Change    Warm-up and Cool-down  Performed as group-led instruction    Resistance Training Performed  Yes    VAD Patient?  No      Pain Assessment   Currently in Pain?  No/denies          Social History   Tobacco Use  Smoking Status Former Smoker  . Packs/day: 1.50  . Years: 30.00  . Pack years: 45.00  . Types: Cigarettes  . Last attempt to quit: 10/06/2014  . Years since quitting: 2.9  Smokeless Tobacco Never Used    Goals Met:  Proper associated with RPD/PD & O2 Sat Independence with exercise equipment Using PLB without cueing & demonstrates good technique Exercise tolerated well Strength training completed today  Goals Unmet:  Not Applicable  Comments: Pt able to follow exercise prescription today without complaint.  Will continue to monitor for progression.    Dr. Emily Filbert is Medical Director for [place] and [name].

## 2017-10-02 DIAGNOSIS — J9610 Chronic respiratory failure, unspecified whether with hypoxia or hypercapnia: Secondary | ICD-10-CM | POA: Diagnosis not present

## 2017-10-02 DIAGNOSIS — R0902 Hypoxemia: Secondary | ICD-10-CM | POA: Diagnosis not present

## 2017-10-02 DIAGNOSIS — I10 Essential (primary) hypertension: Secondary | ICD-10-CM | POA: Diagnosis not present

## 2017-10-02 DIAGNOSIS — J449 Chronic obstructive pulmonary disease, unspecified: Secondary | ICD-10-CM | POA: Diagnosis not present

## 2017-10-04 DIAGNOSIS — F419 Anxiety disorder, unspecified: Secondary | ICD-10-CM | POA: Diagnosis not present

## 2017-10-04 DIAGNOSIS — Z7951 Long term (current) use of inhaled steroids: Secondary | ICD-10-CM | POA: Diagnosis not present

## 2017-10-04 DIAGNOSIS — Z87891 Personal history of nicotine dependence: Secondary | ICD-10-CM | POA: Diagnosis not present

## 2017-10-04 DIAGNOSIS — J449 Chronic obstructive pulmonary disease, unspecified: Secondary | ICD-10-CM | POA: Diagnosis not present

## 2017-10-04 DIAGNOSIS — Z79899 Other long term (current) drug therapy: Secondary | ICD-10-CM | POA: Diagnosis not present

## 2017-10-04 DIAGNOSIS — I10 Essential (primary) hypertension: Secondary | ICD-10-CM | POA: Diagnosis not present

## 2017-10-04 DIAGNOSIS — M1990 Unspecified osteoarthritis, unspecified site: Secondary | ICD-10-CM | POA: Diagnosis not present

## 2017-10-04 DIAGNOSIS — E785 Hyperlipidemia, unspecified: Secondary | ICD-10-CM | POA: Diagnosis not present

## 2017-10-04 DIAGNOSIS — Z7982 Long term (current) use of aspirin: Secondary | ICD-10-CM | POA: Diagnosis not present

## 2017-10-04 NOTE — Progress Notes (Signed)
Daily Session Note  Patient Details  Name: Bethany [name] MRN: [id number] Date of Birth: 23-Jun-1935 Referring Provider:     Pulmonary Rehab from 08/09/2017 in [facility] Cardiac and Pulmonary Rehab  Referring Provider  Tracie Harrier MD      Encounter Date: 10/04/2017  Check In: Session Check In - 10/04/17 1054      Check-In   Location  [facility]-Cardiac & Pulmonary Rehab    Staff Present  Justin Mend RCP,RRT,BSRT;Laureen Owens Shark, BS, RRT, Respiratory Bertis Ruddy, BS, ACSM CEP, Exercise Physiologist    Supervising physician immediately available to respond to emergencies  [name] immediately available ER MD    Physician(s)  Dr. Corky Downs and Alfred Levins    Medication changes reported      No    Fall or balance concerns reported     No    Tobacco Cessation  No Change    Warm-up and Cool-down  Performed as group-led instruction    Resistance Training Performed  Yes    VAD Patient?  No      Pain Assessment   Currently in Pain?  No/denies          Social History   Tobacco Use  Smoking Status Former Smoker  . Packs/day: 1.50  . Years: 30.00  . Pack years: 45.00  . Types: Cigarettes  . Last attempt to quit: 10/06/2014  . Years since quitting: 2.9  Smokeless Tobacco Never Used    Goals Met:  Independence with exercise equipment Exercise tolerated well No report of cardiac concerns or symptoms Strength training completed today  Goals Unmet:  Not Applicable  Comments: Pt able to follow exercise prescription today without complaint.  Will continue to monitor for progression.   Dr. Emily Filbert is Medical Director for [place] and [name].

## 2017-10-06 ENCOUNTER — Encounter: Payer: Medicare HMO | Attending: Internal Medicine

## 2017-10-06 DIAGNOSIS — F419 Anxiety disorder, unspecified: Secondary | ICD-10-CM | POA: Insufficient documentation

## 2017-10-06 DIAGNOSIS — Z79899 Other long term (current) drug therapy: Secondary | ICD-10-CM | POA: Diagnosis not present

## 2017-10-06 DIAGNOSIS — Z7951 Long term (current) use of inhaled steroids: Secondary | ICD-10-CM | POA: Diagnosis not present

## 2017-10-06 DIAGNOSIS — Z87891 Personal history of nicotine dependence: Secondary | ICD-10-CM | POA: Diagnosis not present

## 2017-10-06 DIAGNOSIS — I10 Essential (primary) hypertension: Secondary | ICD-10-CM | POA: Diagnosis not present

## 2017-10-06 DIAGNOSIS — J449 Chronic obstructive pulmonary disease, unspecified: Secondary | ICD-10-CM | POA: Diagnosis not present

## 2017-10-06 DIAGNOSIS — M1990 Unspecified osteoarthritis, unspecified site: Secondary | ICD-10-CM | POA: Diagnosis not present

## 2017-10-06 DIAGNOSIS — E785 Hyperlipidemia, unspecified: Secondary | ICD-10-CM | POA: Diagnosis not present

## 2017-10-06 DIAGNOSIS — Z7982 Long term (current) use of aspirin: Secondary | ICD-10-CM | POA: Diagnosis not present

## 2017-10-06 NOTE — Progress Notes (Signed)
Daily Session Note  Patient Details  Name: Bethany [name] MRN: [id number] Date of Birth: Mar 19, 1936 Referring Provider:     Pulmonary Rehab from 08/09/2017 in [facility] Cardiac and Pulmonary Rehab  Referring Provider  Tracie Harrier MD      Encounter Date: 10/06/2017  Check In: Session Check In - 10/06/17 0948      Check-In   Location  [facility]-Cardiac & Pulmonary Rehab    Staff Present  Justin Mend Lorre Nick, MA, RCEP, CCRP, Exercise Physiologist;Amanda Oletta Darter, [place], ACSM CEP, Exercise Physiologist    Supervising physician immediately available to respond to emergencies  [name] immediately available ER MD    Physician(s)  Dr. Alfred Levins and Burlene Arnt    Medication changes reported      No    Fall or balance concerns reported     No    Tobacco Cessation  No Change    Warm-up and Cool-down  Performed as group-led instruction    Resistance Training Performed  Yes    VAD Patient?  No      Pain Assessment   Currently in Pain?  No/denies          Social History   Tobacco Use  Smoking Status Former Smoker  . Packs/day: 1.50  . Years: 30.00  . Pack years: 45.00  . Types: Cigarettes  . Last attempt to quit: 10/06/2014  . Years since quitting: 3.0  Smokeless Tobacco Never Used    Goals Met:  Independence with exercise equipment Exercise tolerated well No report of cardiac concerns or symptoms Strength training completed today  Goals Unmet:  Not Applicable  Comments: Pt able to follow exercise prescription today without complaint.  Will continue to monitor for progression.   Dr. Emily Filbert is Medical Director for [place] and [name].

## 2017-10-08 DIAGNOSIS — Z79899 Other long term (current) drug therapy: Secondary | ICD-10-CM | POA: Diagnosis not present

## 2017-10-08 DIAGNOSIS — Z7982 Long term (current) use of aspirin: Secondary | ICD-10-CM | POA: Diagnosis not present

## 2017-10-08 DIAGNOSIS — Z87891 Personal history of nicotine dependence: Secondary | ICD-10-CM | POA: Diagnosis not present

## 2017-10-08 DIAGNOSIS — E785 Hyperlipidemia, unspecified: Secondary | ICD-10-CM | POA: Diagnosis not present

## 2017-10-08 DIAGNOSIS — M1990 Unspecified osteoarthritis, unspecified site: Secondary | ICD-10-CM | POA: Diagnosis not present

## 2017-10-08 DIAGNOSIS — I10 Essential (primary) hypertension: Secondary | ICD-10-CM | POA: Diagnosis not present

## 2017-10-08 DIAGNOSIS — J449 Chronic obstructive pulmonary disease, unspecified: Secondary | ICD-10-CM

## 2017-10-08 DIAGNOSIS — Z7951 Long term (current) use of inhaled steroids: Secondary | ICD-10-CM | POA: Diagnosis not present

## 2017-10-08 DIAGNOSIS — F419 Anxiety disorder, unspecified: Secondary | ICD-10-CM | POA: Diagnosis not present

## 2017-10-08 NOTE — Progress Notes (Signed)
Daily Session Note  Patient Details  Name: Bethany [name] MRN: [id number] Date of Birth: 1936-05-04 Referring Provider:     Pulmonary Rehab from 08/09/2017 in [facility] Cardiac and Pulmonary Rehab  Referring Provider  Tracie Harrier MD      Encounter Date: 10/08/2017  Check In: Session Check In - 10/08/17 1002      Check-In   Location  [facility]-Cardiac & Pulmonary Rehab    Staff Present  Justin Mend RCP,RRT,BSRT;Meredith Sherryll Burger, RN Vickki Hearing, BA, ACSM CEP, Exercise Physiologist    Supervising physician immediately available to respond to emergencies  [name] immediately available ER MD    Physician(s)  Dr. Alfred Levins and [facility]    Medication changes reported      No    Fall or balance concerns reported     No    Tobacco Cessation  No Change    Warm-up and Cool-down  Performed as group-led instruction    Resistance Training Performed  Yes    VAD Patient?  No      Pain Assessment   Currently in Pain?  No/denies          Social History   Tobacco Use  Smoking Status Former Smoker  . Packs/day: 1.50  . Years: 30.00  . Pack years: 45.00  . Types: Cigarettes  . Last attempt to quit: 10/06/2014  . Years since quitting: 3.0  Smokeless Tobacco Never Used    Goals Met:  Independence with exercise equipment Exercise tolerated well No report of cardiac concerns or symptoms Strength training completed today  Goals Unmet:  Not Applicable  Comments: Pt able to follow exercise prescription today without complaint.  Will continue to monitor for progression.   Dr. Emily Filbert is Medical Director for [place] and [name].

## 2017-10-11 DIAGNOSIS — M1990 Unspecified osteoarthritis, unspecified site: Secondary | ICD-10-CM | POA: Diagnosis not present

## 2017-10-11 DIAGNOSIS — E785 Hyperlipidemia, unspecified: Secondary | ICD-10-CM | POA: Diagnosis not present

## 2017-10-11 DIAGNOSIS — J449 Chronic obstructive pulmonary disease, unspecified: Secondary | ICD-10-CM | POA: Diagnosis not present

## 2017-10-11 DIAGNOSIS — Z87891 Personal history of nicotine dependence: Secondary | ICD-10-CM | POA: Diagnosis not present

## 2017-10-11 DIAGNOSIS — Z79899 Other long term (current) drug therapy: Secondary | ICD-10-CM | POA: Diagnosis not present

## 2017-10-11 DIAGNOSIS — I10 Essential (primary) hypertension: Secondary | ICD-10-CM | POA: Diagnosis not present

## 2017-10-11 DIAGNOSIS — F419 Anxiety disorder, unspecified: Secondary | ICD-10-CM | POA: Diagnosis not present

## 2017-10-11 DIAGNOSIS — Z7951 Long term (current) use of inhaled steroids: Secondary | ICD-10-CM | POA: Diagnosis not present

## 2017-10-11 DIAGNOSIS — Z7982 Long term (current) use of aspirin: Secondary | ICD-10-CM | POA: Diagnosis not present

## 2017-10-11 NOTE — Progress Notes (Signed)
Daily Session Note  Patient Details  Name: Bethany [name] MRN: [id number] Date of Birth: April 04, 1936 Referring Provider:     Pulmonary Rehab from 08/09/2017 in [facility] Cardiac and Pulmonary Rehab  Referring Provider  Tracie Harrier MD      Encounter Date: 10/11/2017  Check In: Session Check In - 10/11/17 1018      Check-In   Location  [facility]-Cardiac & Pulmonary Rehab    Staff Present  Nada Maclachlan, BA, ACSM CEP, Exercise Physiologist;Kelly Amedeo Plenty, BS, ACSM CEP, Exercise Physiologist;Akram Kissick Flavia Shipper    Supervising physician immediately available to respond to emergencies  [name] immediately available ER MD    Physician(s)  Dr. Kerman Passey and [place]    Medication changes reported      No    Fall or balance concerns reported     No    Tobacco Cessation  No Change    Warm-up and Cool-down  Performed as group-led instruction    Resistance Training Performed  Yes    VAD Patient?  No      Pain Assessment   Currently in Pain?  No/denies          Social History   Tobacco Use  Smoking Status Former Smoker  . Packs/day: 1.50  . Years: 30.00  . Pack years: 45.00  . Types: Cigarettes  . Last attempt to quit: 10/06/2014  . Years since quitting: 3.0  Smokeless Tobacco Never Used    Goals Met:  Independence with exercise equipment Exercise tolerated well No report of cardiac concerns or symptoms Strength training completed today  Goals Unmet:  Not Applicable  Comments: Pt able to follow exercise prescription today without complaint.  Will continue to monitor for progression.   Dr. Emily Filbert is Medical Director for [place] and [name].

## 2017-10-11 NOTE — Progress Notes (Signed)
Pulmonary Individual Treatment Plan  Patient Details  Name: Bethany [name] MRN: [id number] Date of Birth: 02/01/1936 Referring Provider:     Pulmonary Rehab from 08/09/2017 in [facility] Cardiac and Pulmonary Rehab  Referring Provider  Tracie Harrier MD      Initial Encounter Date:    Pulmonary Rehab from 08/09/2017 in [facility] Cardiac and Pulmonary Rehab  Date  08/09/17  Referring Provider  Tracie Harrier MD      Visit Diagnosis: Chronic obstructive pulmonary disease, unspecified COPD type ([place])  Patient's Home Medications on Admission:  Current Outpatient Medications:  .  albuterol (PROVENTIL HFA;VENTOLIN HFA) 108 (90 Base) MCG/ACT inhaler, Inhale 2 puffs into the lungs every 6 (six) hours as needed for wheezing or shortness of breath., Disp: 1 Inhaler, Rfl: 2 .  aspirin (ASPIRIN EC) 81 MG EC tablet, Take 81 mg by mouth daily. Swallow whole., Disp: , Rfl:  .  baclofen (LIORESAL) 10 MG tablet, Take 10 mg by mouth 3 (three) times daily., Disp: , Rfl:  .  CALCIUM CARBONATE PO, Take 1 tablet by mouth daily., Disp: , Rfl:  .  cetirizine (ZYRTEC) 10 MG tablet, Take 10 mg by mouth daily., Disp: , Rfl:  .  Cholecalciferol (VITAMIN D3) 1000 units CAPS, Take 1 capsule by mouth daily., Disp: , Rfl:  .  diazepam (VALIUM) 5 MG tablet, Take 5 mg by mouth at bedtime. Patient takes once at night Reported on 10/16/2015, Disp: , Rfl:  .  docusate sodium (COLACE) 100 MG capsule, Take 100 mg by mouth 2 (two) times daily as needed for mild constipation., Disp: , Rfl:  .  escitalopram (LEXAPRO) 5 MG tablet, Take 10 mg by mouth daily. , Disp: , Rfl:  .  Fluticasone-Salmeterol (ADVAIR) 250-50 MCG/DOSE AEPB, Inhale 1 puff into the lungs 2 (two) times daily., Disp: , Rfl:  .  gabapentin (NEURONTIN) 300 MG capsule, Take 300 mg by mouth 3 (three) times daily., Disp: , Rfl:  .  HYDROcodone-acetaminophen (NORCO/VICODIN) 5-325 MG tablet, Take 1 tablet by mouth 5 (five) times daily as needed for moderate pain. Do not  exceed 5 times daily, patient at least 3 day, Disp: , Rfl:  .  losartan (COZAAR) 50 MG tablet, Take 50 mg by mouth daily., Disp: , Rfl:  .  lovastatin (MEVACOR) 20 MG tablet, Take 20 mg by mouth at bedtime. Reported on 12/12/2015, Disp: , Rfl:  .  metoprolol succinate (TOPROL-XL) 25 MG 24 hr tablet, Take 25 mg by mouth daily., Disp: , Rfl:  .  montelukast (SINGULAIR) 10 MG tablet, Take 10 mg by mouth at bedtime. Reported on 12/12/2015, Disp: , Rfl:  .  tamsulosin (FLOMAX) 0.4 MG CAPS capsule, Take 0.4 mg by mouth daily after supper., Disp: , Rfl:  .  vitamin B-12 (CYANOCOBALAMIN) 1000 MCG tablet, Take 1,000 mcg by mouth daily., Disp: , Rfl:   Past Medical History: Past Medical History:  Diagnosis Date  . Allergy   . Anxiety   . Arthritis   . COPD (chronic obstructive pulmonary disease) ([place])   . Hyperlipidemia   . Hypertension   . Oxygen deficiency     Tobacco Use: Social History   Tobacco Use  Smoking Status Former Smoker  . Packs/day: 1.50  . Years: 30.00  . Pack years: 45.00  . Types: Cigarettes  . Last attempt to quit: 10/06/2014  . Years since quitting: 3.0  Smokeless Tobacco Never Used    Labs: Recent Review Flowsheet Data    There is no flowsheet data  to display.       Pulmonary Assessment Scores: Pulmonary Assessment Scores    Row Name 08/09/17 1425         ADL UCSD   ADL Phase  Entry     SOB Score total  63     Rest  0     Walk  3     Stairs  5     Bath  0     Dress  1     Shop  2       CAT Score   CAT Score  26       mMRC Score   mMRC Score  3        Pulmonary Function Assessment: Pulmonary Function Assessment - 08/09/17 1446      Initial Spirometry Results   FVC%  36 %    FEV1%  30 %    FEV1/FVC Ratio  62.78    Comments  Good patient effort      Post Bronchodilator Spirometry Results   FVC%  43.92 %    FEV1%  32.1 %    FEV1/FVC Ratio  55.17    Comments  Good patient effort      Breath   Bilateral Breath Sounds  Clear;Decreased     Shortness of Breath  Limiting activity;Panic with Shortness of Breath;Yes       Exercise Target Goals:    Exercise Program Goal: Individual exercise prescription set using results from initial 6 min walk test and THRR while considering  patient's activity barriers and safety.    Exercise Prescription Goal: Initial exercise prescription builds to 30-45 minutes a day of aerobic activity, 2-3 days per week.  Home exercise guidelines will be given to patient during program as part of exercise prescription that the participant will acknowledge.  Activity Barriers & Risk Stratification: Activity Barriers & Cardiac Risk Stratification - 08/09/17 1630      Activity Barriers & Cardiac Risk Stratification   Activity Barriers  Back Problems;Deconditioning;Muscular Weakness;Shortness of Breath 8+ surgeries on her back       6 Minute Walk: 6 Minute Walk    Row Name 08/09/17 1627         6 Minute Walk   Phase  Initial     Distance  680 feet     Walk Time  4.77 minutes     # of Rest Breaks  1 1:14     MPH  1.62     METS  2.03     RPE  17     Perceived Dyspnea   3     VO2 Peak  7.11     Symptoms  Yes (comment)     Comments  back pain 7/10, breathing became painful     Resting HR  76 bpm     Resting BP  146/64     Resting Oxygen Saturation   97 %     Exercise Oxygen Saturation  during 6 min walk  86 %     Max Ex. HR  115 bpm     Max Ex. BP  194/104     2 Minute Post BP  174/76 rck 154/70       Interval HR   1 Minute HR  109     2 Minute HR  115     3 Minute HR  110     4 Minute HR  98     5 Minute HR  112  6 Minute HR  115     2 Minute Post HR  82     Interval Heart Rate?  Yes       Interval Oxygen   Interval Oxygen?  Yes     Baseline Oxygen Saturation %  97 %     1 Minute Oxygen Saturation %  91 %     1 Minute Liters of Oxygen  3 L pulsed     2 Minute Oxygen Saturation %  89 %     2 Minute Liters of Oxygen  3 L     3 Minute Oxygen Saturation %  86 % rest break  2:26-3:40     3 Minute Liters of Oxygen  3 L     4 Minute Oxygen Saturation %  93 %     4 Minute Liters of Oxygen  3 L     5 Minute Oxygen Saturation %  89 %     5 Minute Liters of Oxygen  3 L     6 Minute Oxygen Saturation %  86 %     6 Minute Liters of Oxygen  3 L     2 Minute Post Oxygen Saturation %  96 %     2 Minute Post Liters of Oxygen  3 L       Oxygen Initial Assessment: Oxygen Initial Assessment - 08/09/17 1436      Home Oxygen   Home Oxygen Device  Portable Concentrator;Home Concentrator;E-Tanks    Sleep Oxygen Prescription  Continuous    Liters per minute  3    Home Exercise Oxygen Prescription  Continuous    Liters per minute  3    Home at Rest Exercise Oxygen Prescription  Continuous    Liters per minute  3      Initial 6 min Walk   Oxygen Used  Pulsed    Liters per minute  3      Program Oxygen Prescription   Program Oxygen Prescription  Continuous    Liters per minute  3      Intervention   Short Term Goals  To learn and exhibit compliance with exercise, home and travel O2 prescription;To learn and understand importance of maintaining oxygen saturations>88%;To learn and demonstrate proper use of respiratory medications;To learn and demonstrate proper pursed lip breathing techniques or other breathing techniques.;To learn and understand importance of monitoring SPO2 with pulse oximeter and demonstrate accurate use of the pulse oximeter.    Long  Term Goals  Exhibits compliance with exercise, home and travel O2 prescription;Verbalizes importance of monitoring SPO2 with pulse oximeter and return demonstration;Maintenance of O2 saturations>88%;Exhibits proper breathing techniques, such as pursed lip breathing or other method taught during program session;Compliance with respiratory medication;Demonstrates proper use of MDI's       Oxygen Re-Evaluation: Oxygen Re-Evaluation    Row Name 08/20/17 1008 09/22/17 1147 09/22/17 1148         Program Oxygen  Prescription   Program Oxygen Prescription  -  Continuous  -     Liters per minute  -  3  3       Home Oxygen   Home Oxygen Device  -  Portable Concentrator;Home Concentrator;E-Tanks  Portable Concentrator;Home Concentrator;E-Tanks     Sleep Oxygen Prescription  -  CPAP  -     Liters per minute  -  3  3     Home Exercise Oxygen Prescription  -  Continuous  Continuous     Liters  per minute  -  3  3     Home at Rest Exercise Oxygen Prescription  -  -  Continuous     Liters per minute  -  3  3     Compliance with Home Oxygen Use  -  -  Yes       Goals/Expected Outcomes   Short Term Goals  To learn and exhibit compliance with exercise, home and travel O2 prescription;To learn and understand importance of maintaining oxygen saturations>88%;To learn and demonstrate proper use of respiratory medications;To learn and demonstrate proper pursed lip breathing techniques or other breathing techniques.;To learn and understand importance of monitoring SPO2 with pulse oximeter and demonstrate accurate use of the pulse oximeter.  -  To learn and exhibit compliance with exercise, home and travel O2 prescription;To learn and understand importance of monitoring SPO2 with pulse oximeter and demonstrate accurate use of the pulse oximeter.;To learn and understand importance of maintaining oxygen saturations>88%;To learn and demonstrate proper pursed lip breathing techniques or other breathing techniques.;To learn and demonstrate proper use of respiratory medications;Other     Long  Term Goals  Exhibits compliance with exercise, home and travel O2 prescription;Verbalizes importance of monitoring SPO2 with pulse oximeter and return demonstration;Maintenance of O2 saturations>88%;Exhibits proper breathing techniques, such as pursed lip breathing or other method taught during program session;Compliance with respiratory medication;Demonstrates proper use of MDI's  -  Exhibits compliance with exercise, home and travel O2  prescription;Verbalizes importance of monitoring SPO2 with pulse oximeter and return demonstration;Maintenance of O2 saturations>88%;Exhibits proper breathing techniques, such as pursed lip breathing or other method taught during program session;Compliance with respiratory medication;Demonstrates proper use of MDI's     Comments  Reviewed PLB technique with pt.  Talked about how it work and it's important to maintaining his exercise saturations.    -  Pt using O2 as prescribed     Goals/Expected Outcomes  Short: Become more profiecient at using PLB.   Long: Become independent at using PLB.  -  Short - continue to manage SOB and maintain O2 >88%  Long - Pt maintains compliance with 02 and complies with meds         Oxygen Discharge (Final Oxygen Re-Evaluation): Oxygen Re-Evaluation - 09/22/17 1148      Program Oxygen Prescription   Liters per minute  3      Home Oxygen   Home Oxygen Device  Portable Concentrator;Home Concentrator;E-Tanks    Liters per minute  3    Home Exercise Oxygen Prescription  Continuous    Liters per minute  3    Home at Rest Exercise Oxygen Prescription  Continuous    Liters per minute  3    Compliance with Home Oxygen Use  Yes      Goals/Expected Outcomes   Short Term Goals  To learn and exhibit compliance with exercise, home and travel O2 prescription;To learn and understand importance of monitoring SPO2 with pulse oximeter and demonstrate accurate use of the pulse oximeter.;To learn and understand importance of maintaining oxygen saturations>88%;To learn and demonstrate proper pursed lip breathing techniques or other breathing techniques.;To learn and demonstrate proper use of respiratory medications;Other    Long  Term Goals  Exhibits compliance with exercise, home and travel O2 prescription;Verbalizes importance of monitoring SPO2 with pulse oximeter and return demonstration;Maintenance of O2 saturations>88%;Exhibits proper breathing techniques, such as pursed lip  breathing or other method taught during program session;Compliance with respiratory medication;Demonstrates proper use of MDI's    Comments  Pt using O2 as prescribed    Goals/Expected Outcomes  Short - continue to manage SOB and maintain O2 >88%  Long - Pt maintains compliance with 02 and complies with meds        Initial Exercise Prescription: Initial Exercise Prescription - 08/09/17 1600      Date of Initial Exercise RX and Referring Provider   Date  08/09/17    Referring Provider  Tracie Harrier MD      Oxygen   Oxygen  Continuous    Liters  3      Treadmill   MPH  1.3    Grade  0.5    Minutes  15    METs  2.09      NuStep   Level  1    SPM  80    Minutes  15    METs  2      Biostep-RELP   Level  1    SPM  50    Minutes  15    METs  2      Prescription Details   Frequency (times per week)  3    Duration  Progress to 45 minutes of aerobic exercise without signs/symptoms of physical distress      Intensity   THRR 40-80% of Max Heartrate  102-128    Ratings of Perceived Exertion  11-13    Perceived Dyspnea  0-4      Progression   Progression  Continue to progress workloads to maintain intensity without signs/symptoms of physical distress.      Resistance Training   Training Prescription  Yes    Weight  3 lbs    Reps  10-15       Perform Capillary Blood Glucose checks as needed.  Exercise Prescription Changes: Exercise Prescription Changes    Row Name 08/09/17 1600 09/01/17 1100 09/15/17 1200 09/29/17 1200       Response to Exercise   Blood Pressure (Admit)  146/64  142/74  118/58  122/60    Blood Pressure (Exercise)  194/104  -  -  -    Blood Pressure (Exit)  154/70  124/68  112/61  132/64    Heart Rate (Admit)  76 bpm  47 bpm  66 bpm  76 bpm    Heart Rate (Exercise)  115 bpm  88 bpm  85 bpm  88 bpm    Heart Rate (Exit)  82 bpm  72 bpm  71 bpm  63 bpm    Oxygen Saturation (Admit)  97 %  92 %  99 %  89 %    Oxygen Saturation (Exercise)  86 %  91  %  95 %  90 %    Oxygen Saturation (Exit)  96 %  96 %  100 %  90 %    Rating of Perceived Exertion (Exercise)  _0 Perceived Dyspnea (Exercise)  _1 Symptoms  back pain 7/10, painful breathing  -  -  -    Comments  walk test results  -  -  -    Duration  -  Progress to 45 minutes of aerobic exercise without signs/symptoms of physical distress  Progress to 45 minutes of aerobic exercise without signs/symptoms of physical distress  Continue with 45 min of aerobic exercise without signs/symptoms of physical distress.    Intensity  -  THRR unchanged  THRR unchanged  Other (comment) reaching RPE not HR goals - meds/back pain      Progression   Progression  -  Continue to progress workloads to maintain intensity without signs/symptoms of physical distress.  Continue to progress workloads to maintain intensity without signs/symptoms of physical distress.  Continue to progress workloads to maintain intensity without signs/symptoms of physical distress.    Average METs  -  1.75  2  2      Resistance Training   Training Prescription  -  Yes  Yes  Yes    Weight  -  3 lb  3 lb  3 lb    Reps  -  10-15  10-15  10-15      Interval Training   Interval Training  -  No  No  No      Oxygen   Oxygen  -  Continuous  Continuous  Continuous    Liters  -  _0 Treadmill   MPH  -  1.3  1.3  1.3    Grade  -  0.5  0  0    Minutes  -  _1 METs  -  2.09  2  2      NuStep   Level  -  3  -  -    SPM  -  80  -  -    Minutes  -  15  -  -    METs  -  1.5  -  -      Biostep-RELP   Level  -  -  1  2    SPM  -  -  33  28    Minutes  -  -  15  15    METs  -  -  2  2      Home Exercise Plan   Plans to continue exercise at  -  Home (comment) walk outside  Home (comment) walk outside  Home (comment) walk outside    Frequency  -  Add 1 additional day to program exercise sessions.  Add 1 additional day to program exercise sessions.  Add 1 additional day to program exercise  sessions.    Initial Home Exercises Provided  -  09/01/17  09/01/17  09/01/17       Exercise Comments: Exercise Comments    Row Name 08/20/17 1007           Exercise Comments  First full day of exercise!  Patient was oriented to gym and equipment including functions, settings, policies, and procedures.  Patient's individual exercise prescription and treatment plan were reviewed.  All starting workloads were established based on the results of the 6 minute walk test done at initial orientation visit.  The plan for exercise progression was also introduced and progression will be customized based on patient's performance and goals.          Exercise Goals and Review: Exercise Goals    Row Name 08/09/17 1633             Exercise Goals   Increase Physical Activity  Yes       Intervention  Provide advice, education, support and counseling about physical activity/exercise needs.;Develop an individualized exercise prescription for aerobic and resistive training based on initial evaluation findings, risk stratification, comorbidities and participant's personal goals.       Expected Outcomes  Short Term:  Attend rehab on a regular basis to increase amount of physical activity.;Long Term: Add in home exercise to make exercise part of routine and to increase amount of physical activity.;Long Term: Exercising regularly at least 3-5 days a week.       Increase Strength and Stamina  Yes       Intervention  Provide advice, education, support and counseling about physical activity/exercise needs.;Develop an individualized exercise prescription for aerobic and resistive training based on initial evaluation findings, risk stratification, comorbidities and participant's personal goals.       Expected Outcomes  Short Term: Increase workloads from initial exercise prescription for resistance, speed, and METs.;Short Term: Perform resistance training exercises routinely during rehab and add in resistance  training at home;Long Term: Improve cardiorespiratory fitness, muscular endurance and strength as measured by increased METs and functional capacity (6MWT)       Able to understand and use rate of perceived exertion (RPE) scale  Yes       Intervention  Provide education and explanation on how to use RPE scale       Expected Outcomes  Short Term: Able to use RPE daily in rehab to express subjective intensity level;Long Term:  Able to use RPE to guide intensity level when exercising independently       Able to understand and use Dyspnea scale  Yes       Intervention  Provide education and explanation on how to use Dyspnea scale       Expected Outcomes  Short Term: Able to use Dyspnea scale daily in rehab to express subjective sense of shortness of breath during exertion;Long Term: Able to use Dyspnea scale to guide intensity level when exercising independently       Knowledge and understanding of Target Heart Rate Range (THRR)  Yes       Intervention  Provide education and explanation of THRR including how the numbers were predicted and where they are located for reference       Expected Outcomes  Short Term: Able to state/look up THRR;Long Term: Able to use THRR to govern intensity when exercising independently;Short Term: Able to use daily as guideline for intensity in rehab       Able to check pulse independently  Yes       Intervention  Provide education and demonstration on how to check pulse in carotid and radial arteries.;Review the importance of being able to check your own pulse for safety during independent exercise       Expected Outcomes  Short Term: Able to explain why pulse checking is important during independent exercise;Long Term: Able to check pulse independently and accurately       Understanding of Exercise Prescription  Yes       Intervention  Provide education, explanation, and written materials on patient's individual exercise prescription       Expected Outcomes  Short Term: Able  to explain program exercise prescription;Long Term: Able to explain home exercise prescription to exercise independently          Exercise Goals Re-Evaluation : Exercise Goals Re-Evaluation    Row Name 08/20/17 1007 09/01/17 1145 09/15/17 1228 09/29/17 1257       Exercise Goal Re-Evaluation   Exercise Goals Review  Understanding of Exercise Prescription;Able to understand and use Dyspnea scale;Knowledge and understanding of Target Heart Rate Range (THRR);Able to understand and use rate of perceived exertion (RPE) scale  Increase Physical Activity;Able to understand and use rate of perceived exertion (RPE) scale;Increase  Strength and Stamina;Able to understand and use Dyspnea scale;Knowledge and understanding of Target Heart Rate Range (THRR)  Increase Physical Activity;Able to understand and use rate of perceived exertion (RPE) scale;Increase Strength and Stamina;Able to understand and use Dyspnea scale  Increase Physical Activity;Able to understand and use rate of perceived exertion (RPE) scale;Increase Strength and Stamina;Able to understand and use Dyspnea scale    Comments  Reviewed RPE scale, THR and program prescription with pt today.  Pt voiced understanding and was given a copy of goals to take home.   Home exercise reviewed with Pt including HR, RPE, O2.  Khalil still has back pain on TM.  Staff encouraged good posture and daily movement to strengthen core and back muscles.  Oumou had injections for her back pain last week and states it is less painful.  Staff remind her to use good posture on TM and at home to help build core strength.  The back pain seems to affect her exercise progress as much as breathing.  Natale has increased resistance on the biostep. Staff will encourage increasing on NS and TM.    Expected Outcomes  Short: Use RPE daily to regulate intensity.  Long: Follow program prescription in THR.  Short - Noela will walk one day at home in addition to program sessions [place] will be able to walk without back pain for 15 min on TM  Short - Pt will work on posture and core strength on machines Long - pt will be able to exercise with minimal back pain  Short - Hamsini will continue to attend regularly Long - Jissell will improve overall MET level       Discharge Exercise Prescription (Final Exercise Prescription Changes): Exercise Prescription Changes - 09/29/17 1200      Response to Exercise   Blood Pressure (Admit)  122/60    Blood Pressure (Exit)  132/64    Heart Rate (Admit)  76 bpm    Heart Rate (Exercise)  88 bpm    Heart Rate (Exit)  63 bpm    Oxygen Saturation (Admit)  89 %    Oxygen Saturation (Exercise)  90 %    Oxygen Saturation (Exit)  90 %    Rating of Perceived Exertion (Exercise)  13    Perceived Dyspnea (Exercise)  2    Duration  Continue with 45 min of aerobic exercise without signs/symptoms of physical distress.    Intensity  Other (comment) reaching RPE not HR goals - meds/back pain      Progression   Progression  Continue to progress workloads to maintain intensity without signs/symptoms of physical distress.    Average METs  2      Resistance Training   Training Prescription  Yes    Weight  3 lb    Reps  10-15      Interval Training   Interval Training  No      Oxygen   Oxygen  Continuous    Liters  3      Treadmill   MPH  1.3    Grade  0    Minutes  15    METs  2      Biostep-RELP   Level  2    SPM  28    Minutes  15    METs  2      Home Exercise Plan   Plans to continue exercise at  Home (comment) walk outside    Frequency  Add 1 additional  day to program exercise sessions.    Initial Home Exercises Provided  09/01/17       Nutrition:  Target Goals: Understanding of nutrition guidelines, daily intake of sodium <1546m, cholesterol <2018m calories 30% from fat and 7% or less from saturated fats, daily to have 5 or more servings of fruits and vegetables.  Biometrics: Pre Biometrics - 08/09/17 1633       Pre Biometrics   Height  5' 4.1" (1.628 m)    Weight  152 lb 6.4 oz (69.1 kg)    Waist Circumference  31 inches    Hip Circumference  39 inches    Waist to Hip Ratio  0.79 %    BMI (Calculated)  26.08        Nutrition Therapy Plan and Nutrition Goals: Nutrition Therapy & Goals - 09/22/17 1152      Nutrition Therapy   RD appointment deferred  Yes       Nutrition Assessments: Nutrition Assessments - 08/09/17 1428      MEDFICTS Scores   Pre Score  24       Nutrition Goals Re-Evaluation:   Nutrition Goals Discharge (Final Nutrition Goals Re-Evaluation):   Psychosocial: Target Goals: Acknowledge presence or absence of significant depression and/or stress, maximize coping skills, provide positive support system. Participant is able to verbalize types and ability to use techniques and skills needed for reducing stress and depression.   Initial Review & Psychosocial Screening: Initial Psych Review & Screening - 08/09/17 1433      Initial Review   Current issues with  Current Anxiety/Panic      Family Dynamics   Good Support System?  Yes    Comments  She can turn to her brother for support.      Barriers   Psychosocial barriers to participate in program  The patient should benefit from training in stress management and relaxation.      Screening Interventions   Interventions  Encouraged to exercise;To provide support and resources with identified psychosocial needs;Program counselor consult;Provide feedback about the scores to participant    Expected Outcomes  Short Term goal: Utilizing psychosocial counselor, staff and physician to assist with identification of specific Stressors or current issues interfering with healing process. Setting desired goal for each stressor or current issue identified.;Long Term Goal: Stressors or current issues are controlled or eliminated.;Short Term goal: Identification and review with participant of any Quality of Life or Depression  concerns found by scoring the questionnaire.;Long Term goal: The participant improves quality of Life and PHQ9 Scores as seen by post scores and/or verbalization of changes       Quality of Life Scores:  Scores of 19 and below usually indicate a poorer quality of life in these areas.  A difference of  2-3 points is a clinically meaningful difference.  A difference of 2-3 points in the total score of the Quality of Life Index has been associated with significant improvement in overall quality of life, self-image, physical symptoms, and general health in studies assessing change in quality of life.  PHQ-9: Recent Review Flowsheet Data    [facility]9 08/09/2017 04/28/2016 01/01/2016 12/12/2015 11/12/2015   Decreased Interest 1 2 0 0 0   Down, Depressed, Hopeless 0 1 0 0 0   PHQ - 2 Score 1 3 0 0 0   Altered sleeping 2 2 - - -   Tired, decreased energy 2 3 - - -   Change in appetite 3 3 - - -  Feeling bad or failure about yourself  3 1 - - -   Trouble concentrating 0 2 - - -   Moving slowly or fidgety/restless 0 0 - - -   Suicidal thoughts 0 0 - - -   PHQ-9 Score 11 14 - - -   Difficult doing work/chores Very difficult Somewhat difficult - - -     Interpretation of Total Score  Total Score Depression Severity:  1-4 = Minimal depression, 5-9 = Mild depression, 10-14 = Moderate depression, 15-19 = Moderately severe depression, 20-27 = Severe depression   Psychosocial Evaluation and Intervention: Psychosocial Evaluation - 08/23/17 1027      Psychosocial Evaluation & Interventions   Interventions  Encouraged to exercise with the program and follow exercise prescription    Comments  Ms Hieronymus Enid Derry) returned to Pulmonary Rehab recently due to continued difficulty breathing with the COPD.  Counselor met with her for initial psychosocial evaluation.  She has a strong support system with a brother close by; her local church and neighbors.  Ellar reports sleeping well currently but  continues to have a poor appetite.  She denies depression or anxiety symptoms or a history.  She has a PHQ-9 of "11" indicating moderate depression and discussed this with Enid Derry.  She states her mood is typically positive - it's just her health issues and shortness of breath that impact her quality of life.  Her goals are to breathe better and increase her energy while in this program - in order to return to normal activities.  Staff will follow with Enid Derry throughout the course of this program.      Expected Outcomes  Anwen will exercise consistently to achieve her stated goals.   The educational and psychoeducational components of this program will be helpful for understanding and managing her condition more positively.      Continue Psychosocial Services   Follow up required by staff       Psychosocial Re-Evaluation:   Psychosocial Discharge (Final Psychosocial Re-Evaluation):   Education: Education Goals: Education classes will be provided on a weekly basis, covering required topics. Participant will state understanding/return demonstration of topics presented.  Learning Barriers/Preferences: Learning Barriers/Preferences - 08/09/17 1446      Learning Barriers/Preferences   Learning Barriers  Sight wears glasses    Learning Preferences  None       Education Topics:  Initial Evaluation Education: - Verbal, written and demonstration of respiratory meds, oximetry and breathing techniques. Instruction on use of nebulizers and MDIs and importance of monitoring MDI activations.   Pulmonary Rehab from 10/06/2017 in [facility] Cardiac and Pulmonary Rehab  Date  08/09/17  Educator  [facility]  Instruction Review Code  1- Verbalizes Understanding      General Nutrition Guidelines/Fats and Fiber: -Group instruction provided by verbal, written material, models and posters to present the general guidelines for heart healthy nutrition. Gives an explanation and review of dietary fats and fiber.    Pulmonary Rehab from 10/06/2017 in [facility] Cardiac and Pulmonary Rehab  Date  08/30/17  Educator  [facility]  Instruction Review Code  1- Verbalizes Understanding      Controlling Sodium/Reading Food Labels: -Group verbal and written material supporting the discussion of sodium use in heart healthy nutrition. Review and explanation with models, verbal and written materials for utilization of the food label.   Pulmonary Rehab from 10/06/2017 in [facility] Cardiac and Pulmonary Rehab  Date  09/06/17  Educator  [facility]  Instruction Review Code  1- Verbalizes Understanding  Exercise Physiology & General Exercise Guidelines: - Group verbal and written instruction with models to review the exercise physiology of the cardiovascular system and associated critical values. Provides general exercise guidelines with specific guidelines to those with heart or lung disease.    Pulmonary Rehab from 10/06/2017 in [facility] Cardiac and Pulmonary Rehab  Date  09/15/17  Educator  [facility]  Instruction Review Code  1- Verbalizes Understanding      Aerobic Exercise & Resistance Training: - Gives group verbal and written instruction on the various components of exercise. Focuses on aerobic and resistive training programs and the benefits of this training and how to safely progress through these programs.   Pulmonary Rehab from 10/06/2017 in [facility] Cardiac and Pulmonary Rehab  Date  10/01/17  Educator  [facility]  Instruction Review Code  1- Verbalizes Understanding      Flexibility, Balance, Mind/Body Relaxation: Provides group verbal/written instruction on the benefits of flexibility and balance training, including mind/body exercise modes such as yoga, pilates and tai chi.  Demonstration and skill practice provided.   Stress and Anxiety: - Provides group verbal and written instruction about the health risks of elevated stress and causes of high stress.  Discuss the correlation between heart/lung disease and anxiety and treatment options.  Review healthy ways to manage with stress and anxiety.   Depression: - Provides group verbal and written instruction on the correlation between heart/lung disease and depressed mood, treatment options, and the stigmas associated with seeking treatment.   Pulmonary Rehab from 06/17/2016 in [facility] Cardiac and Pulmonary Rehab  Date  05/11/16  Educator  [facility]  Instruction Review Code (retired)  2- meets goals/outcomes      Exercise & Equipment Safety: - Individual verbal instruction and demonstration of equipment use and safety with use of the equipment.   Pulmonary Rehab from 10/06/2017 in [facility] Cardiac and Pulmonary Rehab  Date  08/09/17  Educator  [facility]  Instruction Review Code  1- Verbalizes Understanding      Infection Prevention: - Provides verbal and written material to individual with discussion of infection control including proper hand washing and proper equipment cleaning during exercise session.   Pulmonary Rehab from 10/06/2017 in [facility] Cardiac and Pulmonary Rehab  Date  08/09/17  Educator  [facility]  Instruction Review Code  1- Verbalizes Understanding      Falls Prevention: - Provides verbal and written material to individual with discussion of falls prevention and safety.   Pulmonary Rehab from 10/06/2017 in [facility] Cardiac and Pulmonary Rehab  Date  08/09/17  Educator  [facility]  Instruction Review Code  1- Verbalizes Understanding      Diabetes: - Individual verbal and written instruction to review signs/symptoms of diabetes, desired ranges of glucose level fasting, after meals and with exercise. Advice that pre and post exercise glucose checks will be done for 3 sessions at entry of program.   Chronic Lung Diseases: - Group verbal and written instruction to review updates, respiratory medications, advancements in procedures and treatments. Discuss use of supplemental oxygen including available portable oxygen systems, continuous and intermittent flow rates, concentrators, personal use and  safety guidelines. Review proper use of inhaler and spacers. Provide informative websites for self-education.    Pulmonary Rehab from 10/06/2017 in [facility] Cardiac and Pulmonary Rehab  Date  09/01/17  Educator  [facility]  Instruction Review Code  1- Verbalizes Understanding      Energy Conservation: - Provide group verbal and written instruction for methods to conserve energy, plan and organize activities. Instruct on  pacing techniques, use of adaptive equipment and posture/positioning to relieve shortness of breath.   Triggers and Exacerbations: - Group verbal and written instruction to review types of environmental triggers and ways to prevent exacerbations. Discuss weather changes, air quality and the benefits of nasal washing. Review warning signs and symptoms to help prevent infections. Discuss techniques for effective airway clearance, coughing, and vibrations.   Pulmonary Rehab from 10/06/2017 in [facility] Cardiac and Pulmonary Rehab  Date  09/29/17  Educator  [facility]  Instruction Review Code  1- Verbalizes Understanding      AED/CPR: - Group verbal and written instruction with the use of models to demonstrate the basic use of the AED with the basic ABC's of resuscitation.   Anatomy and Physiology of the Lungs: - Group verbal and written instruction with the use of models to provide basic lung anatomy and physiology related to function, structure and complications of lung disease.   Pulmonary Rehab from 06/17/2016 in [facility] Cardiac and Pulmonary Rehab  Date  05/06/16  Educator  [facility]  Instruction Review Code (retired)  2- meets Designer, fashion/clothing & Physiology of the Heart: - Group verbal and written instruction and models provide basic cardiac anatomy and physiology, with the coronary electrical and arterial systems. Review of Valvular disease and Heart Failure   Pulmonary Rehab from 10/06/2017 in [facility] Cardiac and Pulmonary Rehab  Date  10/06/17  Educator  [facility]  Instruction Review Code  1-  Verbalizes Understanding      Cardiac Medications: - Group verbal and written instruction to review commonly prescribed medications for heart disease. Reviews the medication, class of the drug, and side effects.   Know Your Numbers and Risk Factors: -Group verbal and written instruction about important numbers in your health.  Discussion of what are risk factors and how they play a role in the disease process.  Review of Cholesterol, Blood Pressure, Diabetes, and BMI and the role they play in your overall health.   Sleep Hygiene: -Provides group verbal and written instruction about how sleep can affect your health.  Define sleep hygiene, discuss sleep cycles and impact of sleep habits. Review good sleep hygiene tips.    Pulmonary Rehab from 10/06/2017 in [facility] Cardiac and Pulmonary Rehab  Date  09/22/17  Educator  [facility]  Instruction Review Code  1- Verbalizes Understanding      Other: -Provides group and verbal instruction on various topics (see comments)    Knowledge Questionnaire Score: Knowledge Questionnaire Score - 08/09/17 1432      Knowledge Questionnaire Score   Pre Score  14/18 reviewed with patient        Core Components/Risk Factors/Patient Goals at Admission: Personal Goals and Risk Factors at Admission - 08/09/17 1437      Core Components/Risk Factors/Patient Goals on Admission    Weight Management  Yes;Weight Loss    Intervention  Weight Management: Develop a combined nutrition and exercise program designed to reach desired caloric intake, while maintaining appropriate intake of nutrient and fiber, sodium and fats, and appropriate energy expenditure required for the weight goal.;Weight Management: Provide education and appropriate resources to help participant work on and attain dietary goals.;Weight Management/Obesity: Establish reasonable short term and long term weight goals.    Admit Weight  152 lb 6.4 oz (69.1 kg)    Goal Weight: Short Term  147 lb (66.7 kg)     Goal Weight: Long Term  140 lb (63.5 kg)    Expected Outcomes  Long Term:  Adherence to nutrition and physical activity/exercise program aimed toward attainment of established weight goal;Short Term: Continue to assess and modify interventions until short term weight is achieved;Weight Maintenance: Understanding of the daily nutrition guidelines, which includes 25-35% calories from fat, 7% or less cal from saturated fats, less than 213m cholesterol, less than 1.5gm of sodium, & 5 or more servings of fruits and vegetables daily;Weight Loss: Understanding of general recommendations for a balanced deficit meal plan, which promotes 1-2 lb weight loss per week and includes a negative energy balance of [phone] kcal/d;Understanding recommendations for meals to include 15-35% energy as protein, 25-35% energy from fat, 35-60% energy from carbohydrates, less than 2054mof dietary cholesterol, 20-35 gm of total fiber daily;Understanding of distribution of calorie intake throughout the day with the consumption of 4-5 meals/snacks    Improve shortness of breath with ADL's  Yes    Intervention  Provide education, individualized exercise plan and daily activity instruction to help decrease symptoms of SOB with activities of daily living.    Expected Outcomes  Short Term: Improve cardiorespiratory fitness to achieve a reduction of symptoms when performing ADLs;Long Term: Be able to perform more ADLs without symptoms or delay the onset of symptoms       Core Components/Risk Factors/Patient Goals Review:  Goals and Risk Factor Review    Row Name 09/22/17 1152             Core Components/Risk Factors/Patient Goals Review   Personal Goals Review  Weight Management/Obesity;Improve shortness of breath with ADL's;Hypertension;Develop more efficient breathing techniques such as purse lipped breathing and diaphragmatic breathing and practicing self-pacing with activity.       Review  Pt states she is watching sodium  content and reading food labels.  She is eating more vegetables and drinking more water.  She has decreased sodas to <1 per day.  She feels 02 is better - today is a hard breathing day but in general she can clean at home more.  She missed 2 days of class so staff reviewed home exercise and gave her another copy to encourage exrecise on her own.       Expected Outcomes  Short - Pt will attend class consistently and add one day of exercise at home.  Long - Pt will maintain diet and exrecise habits long term          Core Components/Risk Factors/Patient Goals at Discharge (Final Review):  Goals and Risk Factor Review - 09/22/17 1152      Core Components/Risk Factors/Patient Goals Review   Personal Goals Review  Weight Management/Obesity;Improve shortness of breath with ADL's;Hypertension;Develop more efficient breathing techniques such as purse lipped breathing and diaphragmatic breathing and practicing self-pacing with activity.    Review  Pt states she is watching sodium content and reading food labels.  She is eating more vegetables and drinking more water.  She has decreased sodas to <1 per day.  She feels 02 is better - today is a hard breathing day but in general she can clean at home more.  She missed 2 days of class so staff reviewed home exercise and gave her another copy to encourage exrecise on her own.    Expected Outcomes  Short - Pt will attend class consistently and add one day of exercise at home.  Long - Pt will maintain diet and exrecise habits long term       ITP Comments: ITP Comments    Row Name 08/09/17 1400 08/16/17 0822 09/13/17 0827 10/11/17  0823     ITP Comments  Medical Evaluation completed. Chart sent for review and changes to Dr. Emily Filbert Director of [place]. Diagnosis can be found in [facility] encounter 07/28/17.  30 day review completed. ITP sent to Dr. Emily Filbert Director of [place]. Continue with ITP unless changes are made by physician.   30 day review completed.  ITP sent to Dr. Emily Filbert Director of [place]. Continue with ITP unless changes are made by physician   30 day review completed. ITP sent to Dr. Emily Filbert Director of [place]. Continue with ITP unless changes are made by physician       Comments: 30 day review

## 2017-10-15 ENCOUNTER — Encounter: Payer: Medicare HMO | Admitting: *Deleted

## 2017-10-15 DIAGNOSIS — I10 Essential (primary) hypertension: Secondary | ICD-10-CM | POA: Diagnosis not present

## 2017-10-15 DIAGNOSIS — J449 Chronic obstructive pulmonary disease, unspecified: Secondary | ICD-10-CM

## 2017-10-15 DIAGNOSIS — M1990 Unspecified osteoarthritis, unspecified site: Secondary | ICD-10-CM | POA: Diagnosis not present

## 2017-10-15 DIAGNOSIS — Z7951 Long term (current) use of inhaled steroids: Secondary | ICD-10-CM | POA: Diagnosis not present

## 2017-10-15 DIAGNOSIS — Z7982 Long term (current) use of aspirin: Secondary | ICD-10-CM | POA: Diagnosis not present

## 2017-10-15 DIAGNOSIS — E785 Hyperlipidemia, unspecified: Secondary | ICD-10-CM | POA: Diagnosis not present

## 2017-10-15 DIAGNOSIS — Z79899 Other long term (current) drug therapy: Secondary | ICD-10-CM | POA: Diagnosis not present

## 2017-10-15 DIAGNOSIS — Z87891 Personal history of nicotine dependence: Secondary | ICD-10-CM | POA: Diagnosis not present

## 2017-10-15 DIAGNOSIS — F419 Anxiety disorder, unspecified: Secondary | ICD-10-CM | POA: Diagnosis not present

## 2017-10-15 NOTE — Progress Notes (Signed)
Daily Session Note  Patient Details  Name: Bethany [name] MRN: [id number] Date of Birth: [birth year] Referring Provider:     Pulmonary Rehab from 08/09/2017 in [facility] Cardiac and Pulmonary Rehab  Referring Provider  Bethany Harrier MD      Encounter Date: 10/15/2017  Check In: Session Check In - 10/15/17 1020      Check-In   Location  [facility]-Cardiac & Pulmonary Rehab    Staff Present  Bethany Papa, RN Bethany [name], BA, ACSM CEP, Exercise Physiologist Bethany Goltz RN    Supervising physician immediately available to respond to emergencies  [name] immediately available ER MD    Physician(s)  Dr. Clearnce [name] and Bethany [name]    Medication changes reported      No    Fall or balance concerns reported     No    Tobacco Cessation  No Change    Warm-up and Cool-down  Performed as group-led instruction    Resistance Training Performed  Yes    VAD Patient?  No      Pain Assessment   Currently in Pain?  No/denies          Social History   Tobacco Use  Smoking Status Former Smoker  . Packs/day: 1.50  . Years: 30.00  . Pack years: 45.00  . Types: Cigarettes  . Last attempt to quit: 10/06/2014  . Years since quitting: 3.0  Smokeless Tobacco Never Used    Goals Met:  Proper associated with RPD/PD & O2 Sat Independence with exercise equipment Using PLB without cueing & demonstrates good technique Exercise tolerated well No report of cardiac concerns or symptoms Strength training completed today  Goals Unmet:  Not Applicable  Comments: Pt able to follow exercise prescription today without complaint.  Will continue to monitor for progression.    Dr. Emily [name] is Medical Director for [name] and [name].

## 2017-10-25 ENCOUNTER — Encounter: Payer: Medicare HMO | Admitting: *Deleted

## 2017-10-25 DIAGNOSIS — J449 Chronic obstructive pulmonary disease, unspecified: Secondary | ICD-10-CM | POA: Diagnosis not present

## 2017-10-25 DIAGNOSIS — Z79899 Other long term (current) drug therapy: Secondary | ICD-10-CM | POA: Diagnosis not present

## 2017-10-25 DIAGNOSIS — F419 Anxiety disorder, unspecified: Secondary | ICD-10-CM | POA: Diagnosis not present

## 2017-10-25 DIAGNOSIS — Z7951 Long term (current) use of inhaled steroids: Secondary | ICD-10-CM | POA: Diagnosis not present

## 2017-10-25 DIAGNOSIS — I10 Essential (primary) hypertension: Secondary | ICD-10-CM | POA: Diagnosis not present

## 2017-10-25 DIAGNOSIS — M1990 Unspecified osteoarthritis, unspecified site: Secondary | ICD-10-CM | POA: Diagnosis not present

## 2017-10-25 DIAGNOSIS — Z87891 Personal history of nicotine dependence: Secondary | ICD-10-CM | POA: Diagnosis not present

## 2017-10-25 DIAGNOSIS — Z7982 Long term (current) use of aspirin: Secondary | ICD-10-CM | POA: Diagnosis not present

## 2017-10-25 DIAGNOSIS — E785 Hyperlipidemia, unspecified: Secondary | ICD-10-CM | POA: Diagnosis not present

## 2017-10-25 NOTE — Progress Notes (Signed)
Daily Session Note  Patient Details  Name: Bethany [name] MRN: [id number] Date of Birth: 12/29/1935 Referring Provider:     Pulmonary Rehab from 08/09/2017 in [facility] Cardiac and Pulmonary Rehab  Referring Provider  Tracie Harrier MD      Encounter Date: 10/25/2017  Check In: Session Check In - 10/25/17 1124      Check-In   Location  [facility]-Cardiac & Pulmonary Rehab    Staff Present  Earlean Shawl, BS, ACSM CEP, Exercise Physiologist;Susanne Bice, RN, BSN, Lance Sell, BA, ACSM CEP, Exercise Physiologist    Supervising physician immediately available to respond to emergencies  [name] immediately available ER MD    Physician(s)  Dr. Clearnce Hasten and Dr. Jimmye Norman          Social History   Tobacco Use  Smoking Status Former Smoker  . Packs/day: 1.50  . Years: 30.00  . Pack years: 45.00  . Types: Cigarettes  . Last attempt to quit: 10/06/2014  . Years since quitting: 3.0  Smokeless Tobacco Never Used    Goals Met:  Proper associated with RPD/PD & O2 Sat Independence with exercise equipment Exercise tolerated well No report of cardiac concerns or symptoms Strength training completed today  Goals Unmet:  Not Applicable  Comments: Pt able to follow exercise prescription today without complaint.  Will continue to monitor for progression.    Dr. Emily Filbert is Medical Director for [place] and [name]monary [facility].

## 2017-10-27 ENCOUNTER — Encounter: Payer: Medicare HMO | Admitting: *Deleted

## 2017-10-27 DIAGNOSIS — Z87891 Personal history of nicotine dependence: Secondary | ICD-10-CM | POA: Diagnosis not present

## 2017-10-27 DIAGNOSIS — M1990 Unspecified osteoarthritis, unspecified site: Secondary | ICD-10-CM | POA: Diagnosis not present

## 2017-10-27 DIAGNOSIS — Z7982 Long term (current) use of aspirin: Secondary | ICD-10-CM | POA: Diagnosis not present

## 2017-10-27 DIAGNOSIS — Z7951 Long term (current) use of inhaled steroids: Secondary | ICD-10-CM | POA: Diagnosis not present

## 2017-10-27 DIAGNOSIS — J449 Chronic obstructive pulmonary disease, unspecified: Secondary | ICD-10-CM

## 2017-10-27 DIAGNOSIS — E785 Hyperlipidemia, unspecified: Secondary | ICD-10-CM | POA: Diagnosis not present

## 2017-10-27 DIAGNOSIS — F419 Anxiety disorder, unspecified: Secondary | ICD-10-CM | POA: Diagnosis not present

## 2017-10-27 DIAGNOSIS — I10 Essential (primary) hypertension: Secondary | ICD-10-CM | POA: Diagnosis not present

## 2017-10-27 DIAGNOSIS — Z79899 Other long term (current) drug therapy: Secondary | ICD-10-CM | POA: Diagnosis not present

## 2017-10-27 NOTE — Progress Notes (Signed)
Daily Session Note  Patient Details  Name: Bethany [name] MRN: [id number] Date of Birth: April 11, 1936 Referring Provider:     Pulmonary Rehab from 08/09/2017 in [facility] Cardiac and Pulmonary Rehab  Referring Provider  Tracie Harrier MD      Encounter Date: 10/27/2017  Check In: Session Check In - 10/27/17 1024      Check-In   Location  [facility]-Cardiac & Pulmonary Rehab    Staff Present  Alberteen Sam, MA, RCEP, CCRP, Exercise Physiologist;Meredith Sherryll Burger, RN Vickki Hearing, BA, ACSM CEP, Exercise Physiologist    Supervising physician immediately available to respond to emergencies  [name] immediately available ER MD    Physician(s)  Dr. Alfred Levins and Mariea Clonts    Medication changes reported      No    Fall or balance concerns reported     No    Tobacco Cessation  No Change    Warm-up and Cool-down  Performed as group-led instruction    Resistance Training Performed  Yes    VAD Patient?  No      Pain Assessment   Currently in Pain?  No/denies        Exercise Prescription Changes - 10/26/17 1300      Response to Exercise   Blood Pressure (Admit)  134/74    Blood Pressure (Exit)  110/70    Heart Rate (Admit)  79 bpm    Heart Rate (Exercise)  77 bpm    Heart Rate (Exit)  63 bpm    Oxygen Saturation (Admit)  85 %    Oxygen Saturation (Exercise)  94 %    Oxygen Saturation (Exit)  98 %    Rating of Perceived Exertion (Exercise)  13    Perceived Dyspnea (Exercise)  2    Duration  Continue with 45 min of aerobic exercise without signs/symptoms of physical distress.    Intensity  Other (comment) RPE 13-15      Progression   Progression  Continue to progress workloads to maintain intensity without signs/symptoms of physical distress.    Average METs  2      Resistance Training   Training Prescription  Yes    Weight  3 lb    Reps  10-15      Interval Training   Interval Training  No      Oxygen   Oxygen  Continuous    Liters  3      Biostep-RELP   Level  3    Minutes   15    METs  2      Home Exercise Plan   Plans to continue exercise at  Home (comment) walk outside    Frequency  Add 1 additional day to program exercise sessions.       Social History   Tobacco Use  Smoking Status Former Smoker  . Packs/day: 1.50  . Years: 30.00  . Pack years: 45.00  . Types: Cigarettes  . Last attempt to quit: 10/06/2014  . Years since quitting: 3.0  Smokeless Tobacco Never Used    Goals Met:  Proper associated with RPD/PD & O2 Sat Independence with exercise equipment Using PLB without cueing & demonstrates good technique Exercise tolerated well No report of cardiac concerns or symptoms Strength training completed today  Goals Unmet:  Not Applicable  Comments: Pt able to follow exercise prescription today without complaint.  Will continue to monitor for progression.    Dr. Emily Filbert is Medical Director for [place] and [name].

## 2017-10-29 DIAGNOSIS — J449 Chronic obstructive pulmonary disease, unspecified: Secondary | ICD-10-CM | POA: Diagnosis not present

## 2017-10-29 DIAGNOSIS — J9620 Acute and chronic respiratory failure, unspecified whether with hypoxia or hypercapnia: Secondary | ICD-10-CM | POA: Diagnosis not present

## 2017-10-29 DIAGNOSIS — I10 Essential (primary) hypertension: Secondary | ICD-10-CM | POA: Diagnosis not present

## 2017-10-29 DIAGNOSIS — I82409 Acute embolism and thrombosis of unspecified deep veins of unspecified lower extremity: Secondary | ICD-10-CM | POA: Diagnosis not present

## 2017-11-01 DIAGNOSIS — R0902 Hypoxemia: Secondary | ICD-10-CM | POA: Diagnosis not present

## 2017-11-01 DIAGNOSIS — I10 Essential (primary) hypertension: Secondary | ICD-10-CM | POA: Diagnosis not present

## 2017-11-01 DIAGNOSIS — J449 Chronic obstructive pulmonary disease, unspecified: Secondary | ICD-10-CM | POA: Diagnosis not present

## 2017-11-01 DIAGNOSIS — J9610 Chronic respiratory failure, unspecified whether with hypoxia or hypercapnia: Secondary | ICD-10-CM | POA: Diagnosis not present

## 2017-11-03 DIAGNOSIS — J449 Chronic obstructive pulmonary disease, unspecified: Secondary | ICD-10-CM

## 2017-11-03 DIAGNOSIS — F419 Anxiety disorder, unspecified: Secondary | ICD-10-CM | POA: Diagnosis not present

## 2017-11-03 DIAGNOSIS — Z7982 Long term (current) use of aspirin: Secondary | ICD-10-CM | POA: Diagnosis not present

## 2017-11-03 DIAGNOSIS — Z7951 Long term (current) use of inhaled steroids: Secondary | ICD-10-CM | POA: Diagnosis not present

## 2017-11-03 DIAGNOSIS — E785 Hyperlipidemia, unspecified: Secondary | ICD-10-CM | POA: Diagnosis not present

## 2017-11-03 DIAGNOSIS — I10 Essential (primary) hypertension: Secondary | ICD-10-CM | POA: Diagnosis not present

## 2017-11-03 DIAGNOSIS — Z79899 Other long term (current) drug therapy: Secondary | ICD-10-CM | POA: Diagnosis not present

## 2017-11-03 DIAGNOSIS — M1990 Unspecified osteoarthritis, unspecified site: Secondary | ICD-10-CM | POA: Diagnosis not present

## 2017-11-03 DIAGNOSIS — Z87891 Personal history of nicotine dependence: Secondary | ICD-10-CM | POA: Diagnosis not present

## 2017-11-03 NOTE — Progress Notes (Signed)
Daily Session Note  Patient Details  Name: Bethany [name] MRN: [id number] Date of Birth: 07/29/1935 Referring Provider:     Pulmonary Rehab from 08/09/2017 in [facility] Cardiac and Pulmonary Rehab  Referring Provider  Tracie Harrier MD      Encounter Date: 11/03/2017  Check In: Session Check In - 11/03/17 1117      Check-In   Location  [facility]-Cardiac & Pulmonary Rehab    Staff Present  Justin Mend Lorre Nick, MA, RCEP, CCRP, Exercise Physiologist;Amanda Oletta Darter, [place], ACSM CEP, Exercise Physiologist    Supervising physician immediately available to respond to emergencies  [name] immediately available ER MD    Physician(s)  Dr. Joni Fears and Quentin Cornwall    Medication changes reported      No    Fall or balance concerns reported     No    Tobacco Cessation  No Change    Warm-up and Cool-down  Performed as group-led instruction    Resistance Training Performed  Yes    VAD Patient?  No      Pain Assessment   Currently in Pain?  No/denies          Social History   Tobacco Use  Smoking Status Former Smoker  . Packs/day: 1.50  . Years: 30.00  . Pack years: 45.00  . Types: Cigarettes  . Last attempt to quit: 10/06/2014  . Years since quitting: 3.0  Smokeless Tobacco Never Used    Goals Met:  Independence with exercise equipment Exercise tolerated well No report of cardiac concerns or symptoms Strength training completed today  Goals Unmet:  Not Applicable  Comments: Pt able to follow exercise prescription today without complaint.  Will continue to monitor for progression.   Dr. Emily Filbert is Medical Director for [place] and [name].

## 2017-11-05 ENCOUNTER — Encounter: Payer: Medicare HMO | Admitting: *Deleted

## 2017-11-05 DIAGNOSIS — J449 Chronic obstructive pulmonary disease, unspecified: Secondary | ICD-10-CM

## 2017-11-05 DIAGNOSIS — F419 Anxiety disorder, unspecified: Secondary | ICD-10-CM | POA: Diagnosis not present

## 2017-11-05 DIAGNOSIS — Z79899 Other long term (current) drug therapy: Secondary | ICD-10-CM | POA: Diagnosis not present

## 2017-11-05 DIAGNOSIS — M1990 Unspecified osteoarthritis, unspecified site: Secondary | ICD-10-CM | POA: Diagnosis not present

## 2017-11-05 DIAGNOSIS — I10 Essential (primary) hypertension: Secondary | ICD-10-CM | POA: Diagnosis not present

## 2017-11-05 DIAGNOSIS — Z7982 Long term (current) use of aspirin: Secondary | ICD-10-CM | POA: Diagnosis not present

## 2017-11-05 DIAGNOSIS — Z7951 Long term (current) use of inhaled steroids: Secondary | ICD-10-CM | POA: Diagnosis not present

## 2017-11-05 DIAGNOSIS — E785 Hyperlipidemia, unspecified: Secondary | ICD-10-CM | POA: Diagnosis not present

## 2017-11-05 DIAGNOSIS — Z87891 Personal history of nicotine dependence: Secondary | ICD-10-CM | POA: Diagnosis not present

## 2017-11-05 NOTE — Progress Notes (Signed)
Daily Session Note  Patient Details  Name: Bethany [name] MRN: [id number] Date of Birth: [birth year] Referring Provider:     Pulmonary Rehab from 08/09/2017 in [facility] Cardiac and Pulmonary Rehab  Referring Provider  Tracie Harrier MD      Encounter Date: 11/05/2017  Check In: Session Check In - 11/05/17 1019      Check-In   Location  [facility]-Cardiac & Pulmonary Rehab    Staff Present  Joellyn Rued, BS, PEC;Red Mandt DeForest, RN Vickki Hearing, BA, ACSM CEP, Exercise Physiologist    Supervising physician immediately available to respond to emergencies  [name] immediately available ER MD    Physician(s)  Dr. Kerman Passey and Corky Downs    Medication changes reported      No    Fall or balance concerns reported     No    Tobacco Cessation  No Change    Warm-up and Cool-down  Performed as group-led instruction    Resistance Training Performed  Yes    VAD Patient?  No      Pain Assessment   Currently in Pain?  No/denies          Social History   Tobacco Use  Smoking Status Former Smoker  . Packs/day: 1.50  . Years: 30.00  . Pack years: 45.00  . Types: Cigarettes  . Last attempt to quit: 10/06/2014  . Years since quitting: 3.0  Smokeless Tobacco Never Used    Goals Met:  Proper associated with RPD/PD & O2 Sat Independence with exercise equipment Using PLB without cueing & demonstrates good technique Exercise tolerated well No report of cardiac concerns or symptoms Strength training completed today  Goals Unmet:  Not Applicable  Comments: Pt able to follow exercise prescription today without complaint.  Will continue to monitor for progression.    Dr. Emily Filbert is Medical Director for [place] and [name].

## 2017-11-08 ENCOUNTER — Encounter: Payer: Medicare HMO | Attending: Internal Medicine

## 2017-11-08 DIAGNOSIS — F419 Anxiety disorder, unspecified: Secondary | ICD-10-CM | POA: Diagnosis not present

## 2017-11-08 DIAGNOSIS — Z79899 Other long term (current) drug therapy: Secondary | ICD-10-CM | POA: Insufficient documentation

## 2017-11-08 DIAGNOSIS — Z87891 Personal history of nicotine dependence: Secondary | ICD-10-CM | POA: Insufficient documentation

## 2017-11-08 DIAGNOSIS — J449 Chronic obstructive pulmonary disease, unspecified: Secondary | ICD-10-CM

## 2017-11-08 DIAGNOSIS — Z7951 Long term (current) use of inhaled steroids: Secondary | ICD-10-CM | POA: Insufficient documentation

## 2017-11-08 DIAGNOSIS — E785 Hyperlipidemia, unspecified: Secondary | ICD-10-CM | POA: Diagnosis not present

## 2017-11-08 DIAGNOSIS — I10 Essential (primary) hypertension: Secondary | ICD-10-CM | POA: Insufficient documentation

## 2017-11-08 DIAGNOSIS — M1990 Unspecified osteoarthritis, unspecified site: Secondary | ICD-10-CM | POA: Diagnosis not present

## 2017-11-08 DIAGNOSIS — Z7982 Long term (current) use of aspirin: Secondary | ICD-10-CM | POA: Insufficient documentation

## 2017-11-08 NOTE — Progress Notes (Signed)
Pulmonary Individual Treatment Plan  Patient Details  Name: Bethany [name] MRN: [id number] Date of Birth: 02/01/1936 Referring Provider:     Pulmonary Rehab from 08/09/2017 in [facility] Cardiac and Pulmonary Rehab  Referring Provider  Tracie Harrier MD      Initial Encounter Date:    Pulmonary Rehab from 08/09/2017 in [facility] Cardiac and Pulmonary Rehab  Date  08/09/17  Referring Provider  Tracie Harrier MD      Visit Diagnosis: Chronic obstructive pulmonary disease, unspecified COPD type ([place])  Patient's Home Medications on Admission:  Current Outpatient Medications:  .  albuterol (PROVENTIL HFA;VENTOLIN HFA) 108 (90 Base) MCG/ACT inhaler, Inhale 2 puffs into the lungs every 6 (six) hours as needed for wheezing or shortness of breath., Disp: 1 Inhaler, Rfl: 2 .  aspirin (ASPIRIN EC) 81 MG EC tablet, Take 81 mg by mouth daily. Swallow whole., Disp: , Rfl:  .  baclofen (LIORESAL) 10 MG tablet, Take 10 mg by mouth 3 (three) times daily., Disp: , Rfl:  .  CALCIUM CARBONATE PO, Take 1 tablet by mouth daily., Disp: , Rfl:  .  cetirizine (ZYRTEC) 10 MG tablet, Take 10 mg by mouth daily., Disp: , Rfl:  .  Cholecalciferol (VITAMIN D3) 1000 units CAPS, Take 1 capsule by mouth daily., Disp: , Rfl:  .  diazepam (VALIUM) 5 MG tablet, Take 5 mg by mouth at bedtime. Patient takes once at night Reported on 10/16/2015, Disp: , Rfl:  .  docusate sodium (COLACE) 100 MG capsule, Take 100 mg by mouth 2 (two) times daily as needed for mild constipation., Disp: , Rfl:  .  escitalopram (LEXAPRO) 5 MG tablet, Take 10 mg by mouth daily. , Disp: , Rfl:  .  Fluticasone-Salmeterol (ADVAIR) 250-50 MCG/DOSE AEPB, Inhale 1 puff into the lungs 2 (two) times daily., Disp: , Rfl:  .  gabapentin (NEURONTIN) 300 MG capsule, Take 300 mg by mouth 3 (three) times daily., Disp: , Rfl:  .  HYDROcodone-acetaminophen (NORCO/VICODIN) 5-325 MG tablet, Take 1 tablet by mouth 5 (five) times daily as needed for moderate pain. Do not  exceed 5 times daily, patient at least 3 day, Disp: , Rfl:  .  losartan (COZAAR) 50 MG tablet, Take 50 mg by mouth daily., Disp: , Rfl:  .  lovastatin (MEVACOR) 20 MG tablet, Take 20 mg by mouth at bedtime. Reported on 12/12/2015, Disp: , Rfl:  .  metoprolol succinate (TOPROL-XL) 25 MG 24 hr tablet, Take 25 mg by mouth daily., Disp: , Rfl:  .  montelukast (SINGULAIR) 10 MG tablet, Take 10 mg by mouth at bedtime. Reported on 12/12/2015, Disp: , Rfl:  .  tamsulosin (FLOMAX) 0.4 MG CAPS capsule, Take 0.4 mg by mouth daily after supper., Disp: , Rfl:  .  vitamin B-12 (CYANOCOBALAMIN) 1000 MCG tablet, Take 1,000 mcg by mouth daily., Disp: , Rfl:   Past Medical History: Past Medical History:  Diagnosis Date  . Allergy   . Anxiety   . Arthritis   . COPD (chronic obstructive pulmonary disease) ([place])   . Hyperlipidemia   . Hypertension   . Oxygen deficiency     Tobacco Use: Social History   Tobacco Use  Smoking Status Former Smoker  . Packs/day: 1.50  . Years: 30.00  . Pack years: 45.00  . Types: Cigarettes  . Last attempt to quit: 10/06/2014  . Years since quitting: 3.0  Smokeless Tobacco Never Used    Labs: Recent Review Flowsheet Data    There is no flowsheet data  to display.       Pulmonary Assessment Scores: Pulmonary Assessment Scores    Row Name 08/09/17 1425         ADL UCSD   ADL Phase  Entry     SOB Score total  63     Rest  0     Walk  3     Stairs  5     Bath  0     Dress  1     Shop  2       CAT Score   CAT Score  26       mMRC Score   mMRC Score  3        Pulmonary Function Assessment: Pulmonary Function Assessment - 08/09/17 1446      Initial Spirometry Results   FVC%  36 %    FEV1%  30 %    FEV1/FVC Ratio  62.78    Comments  Good patient effort      Post Bronchodilator Spirometry Results   FVC%  43.92 %    FEV1%  32.1 %    FEV1/FVC Ratio  55.17    Comments  Good patient effort      Breath   Bilateral Breath Sounds  Clear;Decreased     Shortness of Breath  Limiting activity;Panic with Shortness of Breath;Yes       Exercise Target Goals:    Exercise Program Goal: Individual exercise prescription set using results from initial 6 min walk test and THRR while considering  patient's activity barriers and safety.    Exercise Prescription Goal: Initial exercise prescription builds to 30-45 minutes a day of aerobic activity, 2-3 days per week.  Home exercise guidelines will be given to patient during program as part of exercise prescription that the participant will acknowledge.  Activity Barriers & Risk Stratification: Activity Barriers & Cardiac Risk Stratification - 08/09/17 1630      Activity Barriers & Cardiac Risk Stratification   Activity Barriers  Back Problems;Deconditioning;Muscular Weakness;Shortness of Breath 8+ surgeries on her back       6 Minute Walk: 6 Minute Walk    Row Name 08/09/17 1627         6 Minute Walk   Phase  Initial     Distance  680 feet     Walk Time  4.77 minutes     # of Rest Breaks  1 1:14     MPH  1.62     METS  2.03     RPE  17     Perceived Dyspnea   3     VO2 Peak  7.11     Symptoms  Yes (comment)     Comments  back pain 7/10, breathing became painful     Resting HR  76 bpm     Resting BP  146/64     Resting Oxygen Saturation   97 %     Exercise Oxygen Saturation  during 6 min walk  86 %     Max Ex. HR  115 bpm     Max Ex. BP  194/104     2 Minute Post BP  174/76 rck 154/70       Interval HR   1 Minute HR  109     2 Minute HR  115     3 Minute HR  110     4 Minute HR  98     5 Minute HR  112  6 Minute HR  115     2 Minute Post HR  82     Interval Heart Rate?  Yes       Interval Oxygen   Interval Oxygen?  Yes     Baseline Oxygen Saturation %  97 %     1 Minute Oxygen Saturation %  91 %     1 Minute Liters of Oxygen  3 L pulsed     2 Minute Oxygen Saturation %  89 %     2 Minute Liters of Oxygen  3 L     3 Minute Oxygen Saturation %  86 % rest break  2:26-3:40     3 Minute Liters of Oxygen  3 L     4 Minute Oxygen Saturation %  93 %     4 Minute Liters of Oxygen  3 L     5 Minute Oxygen Saturation %  89 %     5 Minute Liters of Oxygen  3 L     6 Minute Oxygen Saturation %  86 %     6 Minute Liters of Oxygen  3 L     2 Minute Post Oxygen Saturation %  96 %     2 Minute Post Liters of Oxygen  3 L       Oxygen Initial Assessment: Oxygen Initial Assessment - 08/09/17 1436      Home Oxygen   Home Oxygen Device  Portable Concentrator;Home Concentrator;E-Tanks    Sleep Oxygen Prescription  Continuous    Liters per minute  3    Home Exercise Oxygen Prescription  Continuous    Liters per minute  3    Home at Rest Exercise Oxygen Prescription  Continuous    Liters per minute  3      Initial 6 min Walk   Oxygen Used  Pulsed    Liters per minute  3      Program Oxygen Prescription   Program Oxygen Prescription  Continuous    Liters per minute  3      Intervention   Short Term Goals  To learn and exhibit compliance with exercise, home and travel O2 prescription;To learn and understand importance of maintaining oxygen saturations>88%;To learn and demonstrate proper use of respiratory medications;To learn and demonstrate proper pursed lip breathing techniques or other breathing techniques.;To learn and understand importance of monitoring SPO2 with pulse oximeter and demonstrate accurate use of the pulse oximeter.    Long  Term Goals  Exhibits compliance with exercise, home and travel O2 prescription;Verbalizes importance of monitoring SPO2 with pulse oximeter and return demonstration;Maintenance of O2 saturations>88%;Exhibits proper breathing techniques, such as pursed lip breathing or other method taught during program session;Compliance with respiratory medication;Demonstrates proper use of MDI's       Oxygen Re-Evaluation: Oxygen Re-Evaluation    Row Name 08/20/17 1008 09/22/17 1147 09/22/17 1148         Program Oxygen  Prescription   Program Oxygen Prescription  --  Continuous  --     Liters per minute  --  3  3       Home Oxygen   Home Oxygen Device  --  Portable Concentrator;Home Concentrator;E-Tanks  Portable Concentrator;Home Concentrator;E-Tanks     Sleep Oxygen Prescription  --  CPAP  --     Liters per minute  --  3  3     Home Exercise Oxygen Prescription  --  Continuous  Continuous     Liters  per minute  --  3  3     Home at Rest Exercise Oxygen Prescription  --  --  Continuous     Liters per minute  --  3  3     Compliance with Home Oxygen Use  --  --  Yes       Goals/Expected Outcomes   Short Term Goals  To learn and exhibit compliance with exercise, home and travel O2 prescription;To learn and understand importance of maintaining oxygen saturations>88%;To learn and demonstrate proper use of respiratory medications;To learn and demonstrate proper pursed lip breathing techniques or other breathing techniques.;To learn and understand importance of monitoring SPO2 with pulse oximeter and demonstrate accurate use of the pulse oximeter.  --  To learn and exhibit compliance with exercise, home and travel O2 prescription;To learn and understand importance of monitoring SPO2 with pulse oximeter and demonstrate accurate use of the pulse oximeter.;To learn and understand importance of maintaining oxygen saturations>88%;To learn and demonstrate proper pursed lip breathing techniques or other breathing techniques.;To learn and demonstrate proper use of respiratory medications;Other     Long  Term Goals  Exhibits compliance with exercise, home and travel O2 prescription;Verbalizes importance of monitoring SPO2 with pulse oximeter and return demonstration;Maintenance of O2 saturations>88%;Exhibits proper breathing techniques, such as pursed lip breathing or other method taught during program session;Compliance with respiratory medication;Demonstrates proper use of MDI's  --  Exhibits compliance with exercise, home  and travel O2 prescription;Verbalizes importance of monitoring SPO2 with pulse oximeter and return demonstration;Maintenance of O2 saturations>88%;Exhibits proper breathing techniques, such as pursed lip breathing or other method taught during program session;Compliance with respiratory medication;Demonstrates proper use of MDI's     Comments  Reviewed PLB technique with pt.  Talked about how it work and it's important to maintaining his exercise saturations.    --  Pt using O2 as prescribed     Goals/Expected Outcomes  Short: Become more profiecient at using PLB.   Long: Become independent at using PLB.  --  Short - continue to manage SOB and maintain O2 >88%  Long - Pt maintains compliance with 02 and complies with meds         Oxygen Discharge (Final Oxygen Re-Evaluation): Oxygen Re-Evaluation - 09/22/17 1148      Program Oxygen Prescription   Liters per minute  3      Home Oxygen   Home Oxygen Device  Portable Concentrator;Home Concentrator;E-Tanks    Liters per minute  3    Home Exercise Oxygen Prescription  Continuous    Liters per minute  3    Home at Rest Exercise Oxygen Prescription  Continuous    Liters per minute  3    Compliance with Home Oxygen Use  Yes      Goals/Expected Outcomes   Short Term Goals  To learn and exhibit compliance with exercise, home and travel O2 prescription;To learn and understand importance of monitoring SPO2 with pulse oximeter and demonstrate accurate use of the pulse oximeter.;To learn and understand importance of maintaining oxygen saturations>88%;To learn and demonstrate proper pursed lip breathing techniques or other breathing techniques.;To learn and demonstrate proper use of respiratory medications;Other    Long  Term Goals  Exhibits compliance with exercise, home and travel O2 prescription;Verbalizes importance of monitoring SPO2 with pulse oximeter and return demonstration;Maintenance of O2 saturations>88%;Exhibits proper breathing techniques,  such as pursed lip breathing or other method taught during program session;Compliance with respiratory medication;Demonstrates proper use of MDI's    Comments  Pt using O2 as prescribed    Goals/Expected Outcomes  Short - continue to manage SOB and maintain O2 >88%  Long - Pt maintains compliance with 02 and complies with meds        Initial Exercise Prescription: Initial Exercise Prescription - 08/09/17 1600      Date of Initial Exercise RX and Referring Provider   Date  08/09/17    Referring Provider  Tracie Harrier MD      Oxygen   Oxygen  Continuous    Liters  3      Treadmill   MPH  1.3    Grade  0.5    Minutes  15    METs  2.09      NuStep   Level  1    SPM  80    Minutes  15    METs  2      Biostep-RELP   Level  1    SPM  50    Minutes  15    METs  2      Prescription Details   Frequency (times per week)  3    Duration  Progress to 45 minutes of aerobic exercise without signs/symptoms of physical distress      Intensity   THRR 40-80% of Max Heartrate  102-128    Ratings of Perceived Exertion  11-13    Perceived Dyspnea  0-4      Progression   Progression  Continue to progress workloads to maintain intensity without signs/symptoms of physical distress.      Resistance Training   Training Prescription  Yes    Weight  3 lbs    Reps  10-15       Perform Capillary Blood Glucose checks as needed.  Exercise Prescription Changes: Exercise Prescription Changes    Row Name 08/09/17 1600 09/01/17 1100 09/15/17 1200 09/29/17 1200 10/13/17 1400     Response to Exercise   Blood Pressure (Admit)  146/64  142/74  118/58  122/60  126/58   Blood Pressure (Exercise)  194/104  --  --  --  --   Blood Pressure (Exit)  154/70  124/68  112/61  132/64  116/56   Heart Rate (Admit)  76 bpm  47 bpm  66 bpm  76 bpm  72 bpm   Heart Rate (Exercise)  115 bpm  88 bpm  85 bpm  88 bpm  71 bpm   Heart Rate (Exit)  82 bpm  72 bpm  71 bpm  63 bpm  69 bpm   Oxygen Saturation  (Admit)  97 %  92 %  99 %  89 %  94 %   Oxygen Saturation (Exercise)  86 %  91 %  95 %  90 %  90 %   Oxygen Saturation (Exit)  96 %  96 %  100 %  90 %  98 %   Rating of Perceived Exertion (Exercise)  _0 Perceived Dyspnea (Exercise)  _1 Symptoms  back pain 7/10, painful breathing  --  --  --  --   Comments  walk test results  --  --  --  --   Duration  --  Progress to 45 minutes of aerobic exercise without signs/symptoms of physical distress  Progress to 45 minutes of aerobic exercise without signs/symptoms of physical distress  Continue with 45 min of  aerobic exercise without signs/symptoms of physical distress.  Continue with 45 min of aerobic exercise without signs/symptoms of physical distress.   Intensity  --  THRR unchanged  THRR unchanged  Other (comment) reaching RPE not HR goals - meds/back pain  Other (comment)     Progression   Progression  --  Continue to progress workloads to maintain intensity without signs/symptoms of physical distress.  Continue to progress workloads to maintain intensity without signs/symptoms of physical distress.  Continue to progress workloads to maintain intensity without signs/symptoms of physical distress.  Continue to progress workloads to maintain intensity without signs/symptoms of physical distress.   Average METs  --  1.75  _0 Resistance Training   Training Prescription  --  Yes  Yes  Yes  Yes   Weight  --  3 lb  3 lb  3 lb  3 lb   Reps  --  10-15  10-15  10-15  10-15     Interval Training   Interval Training  --  No  No  No  No     Oxygen   Oxygen  --  Continuous  Continuous  Continuous  Continuous   Liters  --  _1 Treadmill   MPH  --  1.3  1.3  1.3  1.3   Grade  --  0.5  0  0  0   Minutes  --  _2 METs  --  2.09  _3 NuStep   Level  --  3  --  --  3   SPM  --  80  --  --  80   Minutes  --  15  --  --  15   METs  --  1.5  --  --  1.9     Biostep-RELP   Level  --   --  _4 SPM  --  --  33  28  30   Minutes  --  --  _5 METs  --  --  _6 Home Exercise Plan   Plans to continue exercise at  --  Home (comment) walk outside  Home (comment) walk outside  Home (comment) walk outside  Home (comment) walk outside   Frequency  --  Add 1 additional day to program exercise sessions.  Add 1 additional day to program exercise sessions.  Add 1 additional day to program exercise sessions.  Add 1 additional day to program exercise sessions.   Initial Home Exercises Provided  --  09/01/17  09/01/17  09/01/17  09/01/17   Row Name 10/26/17 1300             Response to Exercise   Blood Pressure (Admit)  134/74       Blood Pressure (Exit)  110/70       Heart Rate (Admit)  79 bpm       Heart Rate (Exercise)  77 bpm       Heart Rate (Exit)  63 bpm       Oxygen Saturation (Admit)  85 %       Oxygen Saturation (Exercise)  94 %       Oxygen Saturation (Exit)  98 %  Rating of Perceived Exertion (Exercise)  13       Perceived Dyspnea (Exercise)  2       Duration  Continue with 45 min of aerobic exercise without signs/symptoms of physical distress.       Intensity  Other (comment) RPE 13-15         Progression   Progression  Continue to progress workloads to maintain intensity without signs/symptoms of physical distress.       Average METs  2         Resistance Training   Training Prescription  Yes       Weight  3 lb       Reps  10-15         Interval Training   Interval Training  No         Oxygen   Oxygen  Continuous       Liters  3         Biostep-RELP   Level  3       Minutes  15       METs  2         Home Exercise Plan   Plans to continue exercise at  Home (comment) walk outside       Frequency  Add 1 additional day to program exercise sessions.          Exercise Comments: Exercise Comments    Row Name 08/20/17 1007           Exercise Comments  First full day of exercise!  Patient was oriented to gym and  equipment including functions, settings, policies, and procedures.  Patient's individual exercise prescription and treatment plan were reviewed.  All starting workloads were established based on the results of the 6 minute walk test done at initial orientation visit.  The plan for exercise progression was also introduced and progression will be customized based on patient's performance and goals.          Exercise Goals and Review: Exercise Goals    Row Name 08/09/17 1633             Exercise Goals   Increase Physical Activity  Yes       Intervention  Provide advice, education, support and counseling about physical activity/exercise needs.;Develop an individualized exercise prescription for aerobic and resistive training based on initial evaluation findings, risk stratification, comorbidities and participant's personal goals.       Expected Outcomes  Short Term: Attend rehab on a regular basis to increase amount of physical activity.;Long Term: Add in home exercise to make exercise part of routine and to increase amount of physical activity.;Long Term: Exercising regularly at least 3-5 days a week.       Increase Strength and Stamina  Yes       Intervention  Provide advice, education, support and counseling about physical activity/exercise needs.;Develop an individualized exercise prescription for aerobic and resistive training based on initial evaluation findings, risk stratification, comorbidities and participant's personal goals.       Expected Outcomes  Short Term: Increase workloads from initial exercise prescription for resistance, speed, and METs.;Short Term: Perform resistance training exercises routinely during rehab and add in resistance training at home;Long Term: Improve cardiorespiratory fitness, muscular endurance and strength as measured by increased METs and functional capacity (6MWT)       Able to understand and use rate of perceived exertion (RPE) scale  Yes       Intervention   Provide  education and explanation on how to use RPE scale       Expected Outcomes  Short Term: Able to use RPE daily in rehab to express subjective intensity level;Long Term:  Able to use RPE to guide intensity level when exercising independently       Able to understand and use Dyspnea scale  Yes       Intervention  Provide education and explanation on how to use Dyspnea scale       Expected Outcomes  Short Term: Able to use Dyspnea scale daily in rehab to express subjective sense of shortness of breath during exertion;Long Term: Able to use Dyspnea scale to guide intensity level when exercising independently       Knowledge and understanding of Target Heart Rate Range (THRR)  Yes       Intervention  Provide education and explanation of THRR including how the numbers were predicted and where they are located for reference       Expected Outcomes  Short Term: Able to state/look up THRR;Long Term: Able to use THRR to govern intensity when exercising independently;Short Term: Able to use daily as guideline for intensity in rehab       Able to check pulse independently  Yes       Intervention  Provide education and demonstration on how to check pulse in carotid and radial arteries.;Review the importance of being able to check your own pulse for safety during independent exercise       Expected Outcomes  Short Term: Able to explain why pulse checking is important during independent exercise;Long Term: Able to check pulse independently and accurately       Understanding of Exercise Prescription  Yes       Intervention  Provide education, explanation, and written materials on patient's individual exercise prescription       Expected Outcomes  Short Term: Able to explain program exercise prescription;Long Term: Able to explain home exercise prescription to exercise independently          Exercise Goals Re-Evaluation : Exercise Goals Re-Evaluation    Row Name 08/20/17 1007 09/01/17 1145 09/15/17 1228  09/29/17 1257 10/13/17 1428     Exercise Goal Re-Evaluation   Exercise Goals Review  Understanding of Exercise Prescription;Able to understand and use Dyspnea scale;Knowledge and understanding of Target Heart Rate Range (THRR);Able to understand and use rate of perceived exertion (RPE) scale  Increase Physical Activity;Able to understand and use rate of perceived exertion (RPE) scale;Increase Strength and Stamina;Able to understand and use Dyspnea scale;Knowledge and understanding of Target Heart Rate Range (THRR)  Increase Physical Activity;Able to understand and use rate of perceived exertion (RPE) scale;Increase Strength and Stamina;Able to understand and use Dyspnea scale  Increase Physical Activity;Able to understand and use rate of perceived exertion (RPE) scale;Increase Strength and Stamina;Able to understand and use Dyspnea scale  Increase Physical Activity;Able to understand and use rate of perceived exertion (RPE) scale;Knowledge and understanding of Target Heart Rate Range (THRR);Increase Strength and Stamina;Able to understand and use Dyspnea scale   Comments  Reviewed RPE scale, THR and program prescription with pt today.  Pt voiced understanding and was given a copy of goals to take home.   Home exercise reviewed with Pt including HR, RPE, O2.  Deisha still has back pain on TM.  Staff encouraged good posture and daily movement to strengthen core and back muscles.  Randa had injections for her back pain last week and states it is less painful.  Staff  remind her to use good posture on TM and at home to help build core strength.  The back pain seems to affect her exercise progress as much as breathing.  Ayline has increased resistance on the biostep. Staff will encourage increasing on NS and TM.  Pt tolerates exercise well.  Back pain keeps her from progressing more.     Expected Outcomes  Short: Use RPE daily to regulate intensity.  Long: Follow program prescription in THR.  Short - Kylii will  walk one day at home in addition to program sessions [place] will be able to walk without back pain for 15 min on TM  Short - Pt will work on posture and core strength on machines Long - pt will be able to exercise with minimal back pain  Short - Tonika will continue to attend regularly Long - Miia will improve overall MET level  Short - Pt will continue to attend classes Long - Pt will build endurance and maintain fitness    Row Name 10/26/17 1306             Exercise Goal Re-Evaluation   Exercise Goals Review  Increase Physical Activity;Able to understand and use rate of perceived exertion (RPE) scale;Increase Strength and Stamina       Comments  Keigan missed a week due to non health related issues.  Regular attendance three times per week would improve progress.       Expected Outcomes  Short - Duru will attend 3 dyas per week Long - Elner will improve MET levels          Discharge Exercise Prescription (Final Exercise Prescription Changes): Exercise Prescription Changes - 10/26/17 1300      Response to Exercise   Blood Pressure (Admit)  134/74    Blood Pressure (Exit)  110/70    Heart Rate (Admit)  79 bpm    Heart Rate (Exercise)  77 bpm    Heart Rate (Exit)  63 bpm    Oxygen Saturation (Admit)  85 %    Oxygen Saturation (Exercise)  94 %    Oxygen Saturation (Exit)  98 %    Rating of Perceived Exertion (Exercise)  13    Perceived Dyspnea (Exercise)  2    Duration  Continue with 45 min of aerobic exercise without signs/symptoms of physical distress.    Intensity  Other (comment) RPE 13-15      Progression   Progression  Continue to progress workloads to maintain intensity without signs/symptoms of physical distress.    Average METs  2      Resistance Training   Training Prescription  Yes    Weight  3 lb    Reps  10-15      Interval Training   Interval Training  No      Oxygen   Oxygen  Continuous    Liters  3      Biostep-RELP   Level  3     Minutes  15    METs  2      Home Exercise Plan   Plans to continue exercise at  Home (comment) walk outside    Frequency  Add 1 additional day to program exercise sessions.       Nutrition:  Target Goals: Understanding of nutrition guidelines, daily intake of sodium <1598m, cholesterol <2050m calories 30% from fat and 7% or less from saturated fats, daily to have 5 or more servings of fruits and vegetables.  Biometrics: Pre  Biometrics - 08/09/17 1633      Pre Biometrics   Height  5' 4.1" (1.628 m)    Weight  152 lb 6.4 oz (69.1 kg)    Waist Circumference  31 inches    Hip Circumference  39 inches    Waist to Hip Ratio  0.79 %    BMI (Calculated)  26.08        Nutrition Therapy Plan and Nutrition Goals: Nutrition Therapy & Goals - 10/27/17 1239      Nutrition Therapy   RD appointment deferred  Yes       Nutrition Assessments: Nutrition Assessments - 08/09/17 1428      MEDFICTS Scores   Pre Score  24       Nutrition Goals Re-Evaluation:   Nutrition Goals Discharge (Final Nutrition Goals Re-Evaluation):   Psychosocial: Target Goals: Acknowledge presence or absence of significant depression and/or stress, maximize coping skills, provide positive support system. Participant is able to verbalize types and ability to use techniques and skills needed for reducing stress and depression.   Initial Review & Psychosocial Screening: Initial Psych Review & Screening - 08/09/17 1433      Initial Review   Current issues with  Current Anxiety/Panic      Family Dynamics   Good Support System?  Yes    Comments  She can turn to her brother for support.      Barriers   Psychosocial barriers to participate in program  The patient should benefit from training in stress management and relaxation.      Screening Interventions   Interventions  Encouraged to exercise;To provide support and resources with identified psychosocial needs;Program counselor consult;Provide feedback  about the scores to participant    Expected Outcomes  Short Term goal: Utilizing psychosocial counselor, staff and physician to assist with identification of specific Stressors or current issues interfering with healing process. Setting desired goal for each stressor or current issue identified.;Long Term Goal: Stressors or current issues are controlled or eliminated.;Short Term goal: Identification and review with participant of any Quality of Life or Depression concerns found by scoring the questionnaire.;Long Term goal: The participant improves quality of Life and PHQ9 Scores as seen by post scores and/or verbalization of changes       Quality of Life Scores:  Scores of 19 and below usually indicate a poorer quality of life in these areas.  A difference of  2-3 points is a clinically meaningful difference.  A difference of 2-3 points in the total score of the Quality of Life Index has been associated with significant improvement in overall quality of life, self-image, physical symptoms, and general health in studies assessing change in quality of life.  PHQ-9: Recent Review Flowsheet Data    Depression screen [facility] 2/9 08/09/2017 04/28/2016 01/01/2016 12/12/2015 11/12/2015   Decreased Interest 1 2 0 0 0   Down, Depressed, Hopeless 0 1 0 0 0   PHQ - 2 Score 1 3 0 0 0   Altered sleeping 2 2 - - -   Tired, decreased energy 2 3 - - -   Change in appetite 3 3 - - -   Feeling bad or failure about yourself  3 1 - - -   Trouble concentrating 0 2 - - -   Moving slowly or fidgety/restless 0 0 - - -   Suicidal thoughts 0 0 - - -   PHQ-9 Score 11 14 - - -   Difficult doing work/chores Very difficult Somewhat  difficult - - -     Interpretation of Total Score  Total Score Depression Severity:  1-4 = Minimal depression, 5-9 = Mild depression, 10-14 = Moderate depression, 15-19 = Moderately severe depression, 20-27 = Severe depression   Psychosocial Evaluation and Intervention: Psychosocial Evaluation -  08/23/17 1027      Psychosocial Evaluation & Interventions   Interventions  Encouraged to exercise with the program and follow exercise prescription    Comments  Ms Fazekas Enid Derry) returned to Pulmonary Rehab recently due to continued difficulty breathing with the COPD.  Counselor met with her for initial psychosocial evaluation.  She has a strong support system with a brother close by; her local church and neighbors.  Rosella reports sleeping well currently but continues to have a poor appetite.  She denies depression or anxiety symptoms or a history.  She has a PHQ-9 of "11" indicating moderate depression and discussed this with Enid Derry.  She states her mood is typically positive - it's just her health issues and shortness of breath that impact her quality of life.  Her goals are to breathe better and increase her energy while in this program - in order to return to normal activities.  Staff will follow with Enid Derry throughout the course of this program.      Expected Outcomes  Kaysen will exercise consistently to achieve her stated goals.   The educational and psychoeducational components of this program will be helpful for understanding and managing her condition more positively.      Continue Psychosocial Services   Follow up required by staff       Psychosocial Re-Evaluation:   Psychosocial Discharge (Final Psychosocial Re-Evaluation):   Education: Education Goals: Education classes will be provided on a weekly basis, covering required topics. Participant will state understanding/return demonstration of topics presented.  Learning Barriers/Preferences: Learning Barriers/Preferences - 08/09/17 1446      Learning Barriers/Preferences   Learning Barriers  Sight wears glasses    Learning Preferences  None       Education Topics:  Initial Evaluation Education: - Verbal, written and demonstration of respiratory meds, oximetry and breathing techniques. Instruction on use of nebulizers  and MDIs and importance of monitoring MDI activations.   Pulmonary Rehab from 11/03/2017 in [facility] Cardiac and Pulmonary Rehab  Date  08/09/17  Educator  [facility]  Instruction Review Code  1- Verbalizes Understanding      General Nutrition Guidelines/Fats and Fiber: -Group instruction provided by verbal, written material, models and posters to present the general guidelines for heart healthy nutrition. Gives an explanation and review of dietary fats and fiber.   Pulmonary Rehab from 11/03/2017 in [facility] Cardiac and Pulmonary Rehab  Date  10/25/17  Educator  CR  Instruction Review Code  1- Verbalizes Understanding      Controlling Sodium/Reading Food Labels: -Group verbal and written material supporting the discussion of sodium use in heart healthy nutrition. Review and explanation with models, verbal and written materials for utilization of the food label.   Pulmonary Rehab from 11/03/2017 in [facility] Cardiac and Pulmonary Rehab  Date  09/06/17  Educator  CR  Instruction Review Code  1- Verbalizes Understanding      Exercise Physiology & General Exercise Guidelines: - Group verbal and written instruction with models to review the exercise physiology of the cardiovascular system and associated critical values. Provides general exercise guidelines with specific guidelines to those with heart or lung disease.    Pulmonary Rehab from 11/03/2017 in [facility] Cardiac and Pulmonary Rehab  Date  09/15/17  Educator  [facility]  Instruction Review Code  1- Verbalizes Understanding      Aerobic Exercise & Resistance Training: - Gives group verbal and written instruction on the various components of exercise. Focuses on aerobic and resistive training programs and the benefits of this training and how to safely progress through these programs.   Pulmonary Rehab from 11/03/2017 in [facility] Cardiac and Pulmonary Rehab  Date  10/01/17  Educator  [facility]  Instruction Review Code  1- Verbalizes Understanding      Flexibility,  Balance, Mind/Body Relaxation: Provides group verbal/written instruction on the benefits of flexibility and balance training, including mind/body exercise modes such as yoga, pilates and tai chi.  Demonstration and skill practice provided.   Stress and Anxiety: - Provides group verbal and written instruction about the health risks of elevated stress and causes of high stress.  Discuss the correlation between heart/lung disease and anxiety and treatment options. Review healthy ways to manage with stress and anxiety.   Pulmonary Rehab from 11/03/2017 in [facility] Cardiac and Pulmonary Rehab  Date  11/03/17  Educator  [facility]  Instruction Review Code  1- Verbalizes Understanding      Depression: - Provides group verbal and written instruction on the correlation between heart/lung disease and depressed mood, treatment options, and the stigmas associated with seeking treatment.   Pulmonary Rehab from 06/17/2016 in [facility] Cardiac and Pulmonary Rehab  Date  05/11/16  Educator  [facility]  Instruction Review Code (retired)  2- meets goals/outcomes      Exercise & Equipment Safety: - Individual verbal instruction and demonstration of equipment use and safety with use of the equipment.   Pulmonary Rehab from 11/03/2017 in [facility] Cardiac and Pulmonary Rehab  Date  08/09/17  Educator  [facility]  Instruction Review Code  1- Verbalizes Understanding      Infection Prevention: - Provides verbal and written material to individual with discussion of infection control including proper hand washing and proper equipment cleaning during exercise session.   Pulmonary Rehab from 11/03/2017 in [facility] Cardiac and Pulmonary Rehab  Date  08/09/17  Educator  [facility]  Instruction Review Code  1- Verbalizes Understanding      Falls Prevention: - Provides verbal and written material to individual with discussion of falls prevention and safety.   Pulmonary Rehab from 11/03/2017 in [facility] Cardiac and Pulmonary Rehab  Date  08/09/17  Educator  [facility]   Instruction Review Code  1- Verbalizes Understanding      Diabetes: - Individual verbal and written instruction to review signs/symptoms of diabetes, desired ranges of glucose level fasting, after meals and with exercise. Advice that pre and post exercise glucose checks will be done for 3 sessions at entry of program.   Chronic Lung Diseases: - Group verbal and written instruction to review updates, respiratory medications, advancements in procedures and treatments. Discuss use of supplemental oxygen including available portable oxygen systems, continuous and intermittent flow rates, concentrators, personal use and safety guidelines. Review proper use of inhaler and spacers. Provide informative websites for self-education.    Pulmonary Rehab from 11/03/2017 in [facility] Cardiac and Pulmonary Rehab  Date  09/01/17  Educator  [facility]  Instruction Review Code  1- Verbalizes Understanding      Energy Conservation: - Provide group verbal and written instruction for methods to conserve energy, plan and organize activities. Instruct on pacing techniques, use of adaptive equipment and posture/positioning to relieve shortness of breath.   Pulmonary Rehab from 11/03/2017 in [facility] Cardiac and Pulmonary  Rehab  Date  10/27/17  Educator  [facility]  Instruction Review Code  1- Verbalizes Understanding      Triggers and Exacerbations: - Group verbal and written instruction to review types of environmental triggers and ways to prevent exacerbations. Discuss weather changes, air quality and the benefits of nasal washing. Review warning signs and symptoms to help prevent infections. Discuss techniques for effective airway clearance, coughing, and vibrations.   Pulmonary Rehab from 11/03/2017 in [facility] Cardiac and Pulmonary Rehab  Date  09/29/17  Educator  [facility]  Instruction Review Code  1- Verbalizes Understanding      AED/CPR: - Group verbal and written instruction with the use of models to demonstrate the basic use of  the AED with the basic ABC's of resuscitation.   Anatomy and Physiology of the Lungs: - Group verbal and written instruction with the use of models to provide basic lung anatomy and physiology related to function, structure and complications of lung disease.   Pulmonary Rehab from 06/17/2016 in [facility] Cardiac and Pulmonary Rehab  Date  05/06/16  Educator  [facility]  Instruction Review Code (retired)  2- meets Designer, fashion/clothing & Physiology of the Heart: - Group verbal and written instruction and models provide basic cardiac anatomy and physiology, with the coronary electrical and arterial systems. Review of Valvular disease and Heart Failure   Pulmonary Rehab from 11/03/2017 in [facility] Cardiac and Pulmonary Rehab  Date  10/06/17  Educator  [facility]  Instruction Review Code  1- Verbalizes Understanding      Cardiac Medications: - Group verbal and written instruction to review commonly prescribed medications for heart disease. Reviews the medication, class of the drug, and side effects.   Pulmonary Rehab from 11/03/2017 in [facility] Cardiac and Pulmonary Rehab  Date  10/15/17  Educator  [facility]  Instruction Review Code  1- Verbalizes Understanding      Know Your Numbers and Risk Factors: -Group verbal and written instruction about important numbers in your health.  Discussion of what are risk factors and how they play a role in the disease process.  Review of Cholesterol, Blood Pressure, Diabetes, and BMI and the role they play in your overall health.   Sleep Hygiene: -Provides group verbal and written instruction about how sleep can affect your health.  Define sleep hygiene, discuss sleep cycles and impact of sleep habits. Review good sleep hygiene tips.    Pulmonary Rehab from 11/03/2017 in [facility] Cardiac and Pulmonary Rehab  Date  09/22/17  Educator  [facility]  Instruction Review Code  1- Verbalizes Understanding      Other: -Provides group and verbal instruction on various topics (see  comments)    Knowledge Questionnaire Score: Knowledge Questionnaire Score - 08/09/17 1432      Knowledge Questionnaire Score   Pre Score  14/18 reviewed with patient        Core Components/Risk Factors/Patient Goals at Admission: Personal Goals and Risk Factors at Admission - 08/09/17 1437      Core Components/Risk Factors/Patient Goals on Admission    Weight Management  Yes;Weight Loss    Intervention  Weight Management: Develop a combined nutrition and exercise program designed to reach desired caloric intake, while maintaining appropriate intake of nutrient and fiber, sodium and fats, and appropriate energy expenditure required for the weight goal.;Weight Management: Provide education and appropriate resources to help participant work on and attain dietary goals.;Weight Management/Obesity: Establish reasonable short term and long term weight goals.    Admit Weight  152 lb 6.4 oz (69.1 kg)    Goal Weight: Short Term  147 lb (66.7 kg)    Goal Weight: Long Term  140 lb (63.5 kg)    Expected Outcomes  Long Term: Adherence to nutrition and physical activity/exercise program aimed toward attainment of established weight goal;Short Term: Continue to assess and modify interventions until short term weight is achieved;Weight Maintenance: Understanding of the daily nutrition guidelines, which includes 25-35% calories from fat, 7% or less cal from saturated fats, less than 293m cholesterol, less than 1.5gm of sodium, & 5 or more servings of fruits and vegetables daily;Weight Loss: Understanding of general recommendations for a balanced deficit meal plan, which promotes 1-2 lb weight loss per week and includes a negative energy balance of [id number] kcal/d;Understanding recommendations for meals to include 15-35% energy as protein, 25-35% energy from fat, 35-60% energy from carbohydrates, less than 204mof dietary cholesterol, 20-35 gm of total fiber daily;Understanding of distribution of calorie intake  throughout the day with the consumption of 4-5 meals/snacks    Improve shortness of breath with ADL's  Yes    Intervention  Provide education, individualized exercise plan and daily activity instruction to help decrease symptoms of SOB with activities of daily living.    Expected Outcomes  Short Term: Improve cardiorespiratory fitness to achieve a reduction of symptoms when performing ADLs;Long Term: Be able to perform more ADLs without symptoms or delay the onset of symptoms       Core Components/Risk Factors/Patient Goals Review:  Goals and Risk Factor Review    Row Name 09/22/17 1152 10/27/17 1240           Core Components/Risk Factors/Patient Goals Review   Personal Goals Review  Weight Management/Obesity;Improve shortness of breath with ADL's;Hypertension;Develop more efficient breathing techniques such as purse lipped breathing and diaphragmatic breathing and practicing self-pacing with activity.  Weight Management/Obesity;Improve shortness of breath with ADL's;Hypertension;Stress      Review  Pt states she is watching sodium content and reading food labels.  She is eating more vegetables and drinking more water.  She has decreased sodas to <1 per day.  She feels 02 is better - today is a hard breathing day but in general she can clean at home more.  She missed 2 days of class so staff reviewed home exercise and gave her another copy to encourage exrecise on her own.  [employer] she has been exercising at home on her own by walking for 30 minutes at a time.  She states her breathing is better, ADLS are easier.  Her O2 and Bp hae been good when measured in class and at home.  She would like to lose more weight and says she is continuing to eat healthy and watch portion sizes.  She stil has back pain.  Staff remind her of posture on the TM and while walking.      Expected Outcomes  Short - Pt will attend class consistently and add one day of exercise at home.  Long - Pt will maintain diet  and exrecise habits long term  Short - [employer] will concentrate on poper posture during exercise to reduce back pain Long - Pt will be able to exercise with less back pain         Core Components/Risk Factors/Patient Goals at Discharge (Final Review):  Goals and Risk Factor Review - 10/27/17 1240      Core Components/Risk Factors/Patient Goals Review   Personal Goals Review  Weight Management/Obesity;Improve shortness of breath  with ADL's;Hypertension;Stress    Review  Or[place] reports she has been exercising at home on her own by walking for 30 minutes at a time.  She states her breathing is better, ADLS are easier.  Her O2 and Bp hae been good when measured in class and at home.  She would like to lose more weight and says she is continuing to eat healthy and watch portion sizes.  She stil has back pain.  Staff remind her of posture on the TM and while walking.    Expected Outcomes  Short - Adlai will concentrate on poper posture during exercise to reduce back pain Long - Pt will be able to exercise with less back pain       ITP Comments: ITP Comments    Row Name 08/09/17 1400 08/16/17 0822 09/13/17 0827 10/11/17 0823 11/08/17 0819   ITP Comments  Medical Evaluation completed. Chart sent for review and changes to Dr. Emily Filbert Director of [place]. Diagnosis can be found in [facility] encounter 07/28/17.  30 day review completed. ITP sent to Dr. Emily Filbert Director of [place]. Continue with ITP unless changes are made by physician.   30 day review completed. ITP sent to Dr. Emily Filbert Director of [facility]. Continue with ITP unless changes are made by physician   30 day review completed. ITP sent to Dr. Emily Filbert Director of [place]. Continue with ITP unless changes are made by physician   30 day review completed. ITP sent to Dr. Emily Filbert Director of [place]. Continue with ITP unless changes are made by physician      Comments: 30 day review

## 2017-11-08 NOTE — Progress Notes (Signed)
Daily Session Note  Patient Details  Name: Bethany [name] MRN: [id number] Date of Birth: 1936-02-12 Referring Provider:     Pulmonary Rehab from 08/09/2017 in [facility] Cardiac and Pulmonary Rehab  Referring Provider  Tracie Harrier MD      Encounter Date: 11/08/2017  Check In: Session Check In - 11/08/17 1012      Check-In   Location  [facility]-Cardiac & Pulmonary Rehab    Staff Present  Earlean Shawl, BS, ACSM CEP, Exercise Physiologist;Joseph Hood RCP,RRT,BSRT;Mandi Ballard, BS, [facility]    Supervising physician immediately available to respond to emergencies  [name] immediately available ER MD    Physician(s)  Dr. Corky Downs and Cinda Quest    Medication changes reported      No    Fall or balance concerns reported     No    Tobacco Cessation  No Change    Warm-up and Cool-down  Performed as group-led instruction    Resistance Training Performed  Yes    VAD Patient?  No      Pain Assessment   Currently in Pain?  No/denies          Social History   Tobacco Use  Smoking Status Former Smoker  . Packs/day: 1.50  . Years: 30.00  . Pack years: 45.00  . Types: Cigarettes  . Last attempt to quit: 10/06/2014  . Years since quitting: 3.0  Smokeless Tobacco Never Used    Goals Met:  Independence with exercise equipment Exercise tolerated well No report of cardiac concerns or symptoms Strength training completed today  Goals Unmet:  Not Applicable  Comments: Pt able to follow exercise prescription today without complaint.  Will continue to monitor for progression.   Dr. Emily Filbert is Medical Director for [place] and [name].

## 2017-11-09 DIAGNOSIS — M5136 Other intervertebral disc degeneration, lumbar region: Secondary | ICD-10-CM | POA: Diagnosis not present

## 2017-11-09 DIAGNOSIS — M5416 Radiculopathy, lumbar region: Secondary | ICD-10-CM | POA: Diagnosis not present

## 2017-11-10 DIAGNOSIS — F419 Anxiety disorder, unspecified: Secondary | ICD-10-CM | POA: Diagnosis not present

## 2017-11-10 DIAGNOSIS — J449 Chronic obstructive pulmonary disease, unspecified: Secondary | ICD-10-CM

## 2017-11-10 DIAGNOSIS — Z7951 Long term (current) use of inhaled steroids: Secondary | ICD-10-CM | POA: Diagnosis not present

## 2017-11-10 DIAGNOSIS — Z7982 Long term (current) use of aspirin: Secondary | ICD-10-CM | POA: Diagnosis not present

## 2017-11-10 DIAGNOSIS — I10 Essential (primary) hypertension: Secondary | ICD-10-CM | POA: Diagnosis not present

## 2017-11-10 DIAGNOSIS — Z79899 Other long term (current) drug therapy: Secondary | ICD-10-CM | POA: Diagnosis not present

## 2017-11-10 DIAGNOSIS — E785 Hyperlipidemia, unspecified: Secondary | ICD-10-CM | POA: Diagnosis not present

## 2017-11-10 DIAGNOSIS — Z87891 Personal history of nicotine dependence: Secondary | ICD-10-CM | POA: Diagnosis not present

## 2017-11-10 DIAGNOSIS — M1990 Unspecified osteoarthritis, unspecified site: Secondary | ICD-10-CM | POA: Diagnosis not present

## 2017-11-10 NOTE — Progress Notes (Signed)
Daily Session Note  Patient Details  Name: Bethany [name] MRN: [id number] Date of Birth: 12/19/1935 Referring Provider:     Pulmonary Rehab from 08/09/2017 in [facility] Cardiac and Pulmonary Rehab  Referring Provider  Tracie Harrier MD      Encounter Date: 11/10/2017  Check In: Session Check In - 11/10/17 1003      Check-In   Location  [facility]-Cardiac & Pulmonary Rehab    Staff Present  Justin Mend RCP,RRT,BSRT;Krista Frederico Hamman, RN Vickki Hearing, BA, ACSM CEP, Exercise Physiologist    Supervising physician immediately available to respond to emergencies  [name] immediately available ER MD    Physician(s)  Dr. Quentin Cornwall and Jimmye Norman    Medication changes reported      No    Fall or balance concerns reported     No    Tobacco Cessation  No Change    Warm-up and Cool-down  Performed as group-led instruction    Resistance Training Performed  Yes    VAD Patient?  No      Pain Assessment   Currently in Pain?  No/denies          Social History   Tobacco Use  Smoking Status Former Smoker  . Packs/day: 1.50  . Years: 30.00  . Pack years: 45.00  . Types: Cigarettes  . Last attempt to quit: 10/06/2014  . Years since quitting: 3.0  Smokeless Tobacco Never Used    Goals Met:  Independence with exercise equipment Exercise tolerated well No report of cardiac concerns or symptoms Strength training completed today  Goals Unmet:  Not Applicable  Comments: Pt able to follow exercise prescription today without complaint.  Will continue to monitor for progression.   Dr. Emily Filbert is Medical Director for [place] and [name].

## 2017-11-16 DIAGNOSIS — M5136 Other intervertebral disc degeneration, lumbar region: Secondary | ICD-10-CM | POA: Diagnosis not present

## 2017-11-16 DIAGNOSIS — I2510 Atherosclerotic heart disease of native coronary artery without angina pectoris: Secondary | ICD-10-CM | POA: Diagnosis not present

## 2017-11-16 DIAGNOSIS — J432 Centrilobular emphysema: Secondary | ICD-10-CM | POA: Diagnosis not present

## 2017-11-16 DIAGNOSIS — I6523 Occlusion and stenosis of bilateral carotid arteries: Secondary | ICD-10-CM | POA: Diagnosis not present

## 2017-11-16 DIAGNOSIS — M150 Primary generalized (osteo)arthritis: Secondary | ICD-10-CM | POA: Diagnosis not present

## 2017-11-16 DIAGNOSIS — I10 Essential (primary) hypertension: Secondary | ICD-10-CM | POA: Diagnosis not present

## 2017-11-16 DIAGNOSIS — J449 Chronic obstructive pulmonary disease, unspecified: Secondary | ICD-10-CM | POA: Diagnosis not present

## 2017-11-16 DIAGNOSIS — D649 Anemia, unspecified: Secondary | ICD-10-CM | POA: Diagnosis not present

## 2017-11-16 DIAGNOSIS — I739 Peripheral vascular disease, unspecified: Secondary | ICD-10-CM | POA: Diagnosis not present

## 2017-11-16 DIAGNOSIS — K219 Gastro-esophageal reflux disease without esophagitis: Secondary | ICD-10-CM | POA: Diagnosis not present

## 2017-11-16 DIAGNOSIS — I2584 Coronary atherosclerosis due to calcified coronary lesion: Secondary | ICD-10-CM | POA: Diagnosis not present

## 2017-11-16 DIAGNOSIS — R296 Repeated falls: Secondary | ICD-10-CM | POA: Diagnosis not present

## 2017-11-16 DIAGNOSIS — R42 Dizziness and giddiness: Secondary | ICD-10-CM | POA: Diagnosis not present

## 2017-11-22 DIAGNOSIS — M1990 Unspecified osteoarthritis, unspecified site: Secondary | ICD-10-CM | POA: Diagnosis not present

## 2017-11-22 DIAGNOSIS — Z7982 Long term (current) use of aspirin: Secondary | ICD-10-CM | POA: Diagnosis not present

## 2017-11-22 DIAGNOSIS — Z87891 Personal history of nicotine dependence: Secondary | ICD-10-CM | POA: Diagnosis not present

## 2017-11-22 DIAGNOSIS — J449 Chronic obstructive pulmonary disease, unspecified: Secondary | ICD-10-CM | POA: Diagnosis not present

## 2017-11-22 DIAGNOSIS — Z7951 Long term (current) use of inhaled steroids: Secondary | ICD-10-CM | POA: Diagnosis not present

## 2017-11-22 DIAGNOSIS — Z79899 Other long term (current) drug therapy: Secondary | ICD-10-CM | POA: Diagnosis not present

## 2017-11-22 DIAGNOSIS — E785 Hyperlipidemia, unspecified: Secondary | ICD-10-CM | POA: Diagnosis not present

## 2017-11-22 DIAGNOSIS — I10 Essential (primary) hypertension: Secondary | ICD-10-CM | POA: Diagnosis not present

## 2017-11-22 DIAGNOSIS — F419 Anxiety disorder, unspecified: Secondary | ICD-10-CM | POA: Diagnosis not present

## 2017-11-22 NOTE — Progress Notes (Signed)
Daily Session Note  Patient Details  Name: Bethany [name] MRN: [id number] Date of Birth: [birth year] Referring Provider:     Pulmonary Rehab from 08/09/2017 in [facility] Cardiac and Pulmonary Rehab  Referring Provider  Tracie Harrier MD      Encounter Date: 11/22/2017  Check In: Session Check In - 11/22/17 1015      Check-In   Location  [facility]Cardiac [facility] Pulmonary Re[facility]    Staff Present  Justin Mend RCP,RRT,BSRT;Amanda Oletta Darter, BA, ACSM CEP, Exercise Physiologist;Kelly Amedeo Plenty, BS, ACSM CEP, Exercise Physiologist    Supervising physician immediately available to respond to emergencies  [name] immediately available ER MD    Physician(s)  Dr. Corky Downs and Clearnce Hasten    Medication changes reported      No    Fall or balance concerns reported     No    Tobacco Cessation  No Change    Warm-up and Cool-down  Performed as group-led instruction    Resistance Training Performed  Yes    VAD Patient?  No      Pain Assessment   Currently in Pain?  No/denies          Social History   Tobacco Use  Smoking Status Former Smoker  . Packs/day: 1.50  . Years: 30.00  . Pack years: 45.00  . Types: Cigarettes  . Last attempt to quit: 10/06/2014  . Years since quitting: 3.1  Smokeless Tobacco Never Used    Goals Met:  Independence with exercise equipment Exercise tolerated well No report of cardiac concerns or symptoms Strength training completed today  Goals Unmet:  Not Applicable  Comments: Pt able to follow exercise prescription today without complaint.  Will continue to monitor for progression.   Dr. Emily Filbert is Medical Director for [place] and [name].

## 2017-11-24 DIAGNOSIS — J449 Chronic obstructive pulmonary disease, unspecified: Secondary | ICD-10-CM | POA: Diagnosis not present

## 2017-11-24 DIAGNOSIS — Z79899 Other long term (current) drug therapy: Secondary | ICD-10-CM | POA: Diagnosis not present

## 2017-11-24 DIAGNOSIS — Z7951 Long term (current) use of inhaled steroids: Secondary | ICD-10-CM | POA: Diagnosis not present

## 2017-11-24 DIAGNOSIS — I10 Essential (primary) hypertension: Secondary | ICD-10-CM | POA: Diagnosis not present

## 2017-11-24 DIAGNOSIS — E785 Hyperlipidemia, unspecified: Secondary | ICD-10-CM | POA: Diagnosis not present

## 2017-11-24 DIAGNOSIS — Z7982 Long term (current) use of aspirin: Secondary | ICD-10-CM | POA: Diagnosis not present

## 2017-11-24 DIAGNOSIS — F419 Anxiety disorder, unspecified: Secondary | ICD-10-CM | POA: Diagnosis not present

## 2017-11-24 DIAGNOSIS — Z87891 Personal history of nicotine dependence: Secondary | ICD-10-CM | POA: Diagnosis not present

## 2017-11-24 DIAGNOSIS — M1990 Unspecified osteoarthritis, unspecified site: Secondary | ICD-10-CM | POA: Diagnosis not present

## 2017-11-24 NOTE — Progress Notes (Signed)
Daily Session Note  Patient Details  Name: VERONIKA HEARD MRN: [id number] Date of Birth: [birth year] Referring Provider:     Pulmonary Rehab from 08/09/2017 in [facility] Cardiac and Pulmonary Rehab  Referring Provider  Tracie Harrier MD      Encounter Date: 11/24/2017  Check In: Session Check In - 11/24/17 0949      Check-In   Location  [facility]Cardiac [facility] Pulmonary Rehab    Staff Present  Justin Mend Lorre Nick, MA, RCEP, CCRP, Exercise Physiologist;Amanda Oletta Darter, [place], ACSM CEP, Exercise Physiologist    Supervising physician immediately available to respond to emergencies  [name] immediately available ER MD    Physician(s)  Dr. Jimmye Norman and Corky Downs    Medication changes reported      No    Fall or balance concerns reported     No    Tobacco Cessation  No Change    Warm-up and Cool-down  Performed as group-led instruction    Resistance Training Performed  Yes    VAD Patient?  No      Pain Assessment   Currently in Pain?  No/denies          Social History   Tobacco Use  Smoking Status Former Smoker  . Packs/day: 1.50  . Years: 30.00  . Pack years: 45.00  . Types: Cigarettes  . Last attempt to quit: 10/06/2014  . Years since quitting: 3.1  Smokeless Tobacco Never Used    Goals Met:  Independence with exercise equipment Exercise tolerated well No report of cardiac concerns or symptoms Strength training completed today  Goals Unmet:  Not Applicable  Comments: Pt able to follow exercise prescription today without complaint.  Will continue to monitor for progression.   Dr. Emily Filbert is Medical Director for [place] and [name].

## 2017-11-26 DIAGNOSIS — F419 Anxiety disorder, unspecified: Secondary | ICD-10-CM | POA: Diagnosis not present

## 2017-11-26 DIAGNOSIS — E785 Hyperlipidemia, unspecified: Secondary | ICD-10-CM | POA: Diagnosis not present

## 2017-11-26 DIAGNOSIS — J449 Chronic obstructive pulmonary disease, unspecified: Secondary | ICD-10-CM

## 2017-11-26 DIAGNOSIS — I10 Essential (primary) hypertension: Secondary | ICD-10-CM | POA: Diagnosis not present

## 2017-11-26 DIAGNOSIS — Z79899 Other long term (current) drug therapy: Secondary | ICD-10-CM | POA: Diagnosis not present

## 2017-11-26 DIAGNOSIS — Z7982 Long term (current) use of aspirin: Secondary | ICD-10-CM | POA: Diagnosis not present

## 2017-11-26 DIAGNOSIS — Z7951 Long term (current) use of inhaled steroids: Secondary | ICD-10-CM | POA: Diagnosis not present

## 2017-11-26 DIAGNOSIS — Z87891 Personal history of nicotine dependence: Secondary | ICD-10-CM | POA: Diagnosis not present

## 2017-11-26 DIAGNOSIS — M1990 Unspecified osteoarthritis, unspecified site: Secondary | ICD-10-CM | POA: Diagnosis not present

## 2017-11-26 NOTE — Progress Notes (Signed)
Daily Session Note  Patient Details  Name: Bethany [name] MRN: [id number] Date of Birth: [birth year] Referring Provider:     Pulmonary Rehab from 08/09/2017 in [facility] Cardiac and Pulmonary Rehab  Referring Provider  Tracie Harrier MD      Encounter Date: 11/26/2017  Check In: Session Check In - 11/26/17 1015      Check-In   Location  [facility]iac [facility] Pulmonary Re[facility]    Staff Present  Justin Mend RCP,RRT,BSRT;Amanda Oletta Darter, BA, ACSM CEP, Exercise Physiologist;Meredith Sherryll Burger, RN BSN    Supervising physician immediately available to respond to emergencies  [name] immediately available ER MD    Physician(s)  Dr. Corky Downs and Alfred Levins    Medication changes reported      No    Fall or balance concerns reported     No    Tobacco Cessation  No Change    Warm-up and Cool-down  Performed as group-led instruction    Resistance Training Performed  Yes    VAD Patient?  No      Pain Assessment   Currently in Pain?  No/denies          Social History   Tobacco Use  Smoking Status Former Smoker  . Packs/day: 1.50  . Years: 30.00  . Pack years: 45.00  . Types: Cigarettes  . Last attempt to quit: 10/06/2014  . Years since quitting: 3.1  Smokeless Tobacco Never Used    Goals Met:  Independence with exercise equipment Exercise tolerated well No report of cardiac concerns or symptoms Strength training completed today  Goals Unmet:  Not Applicable  Comments: Pt able to follow exercise prescription today without complaint.  Will continue to monitor for progression.   Dr. Emily Filbert is Medical Director for [place] and [name].

## 2017-11-29 DIAGNOSIS — J9620 Acute and chronic respiratory failure, unspecified whether with hypoxia or hypercapnia: Secondary | ICD-10-CM | POA: Diagnosis not present

## 2017-11-29 DIAGNOSIS — Z7982 Long term (current) use of aspirin: Secondary | ICD-10-CM | POA: Diagnosis not present

## 2017-11-29 DIAGNOSIS — Z7951 Long term (current) use of inhaled steroids: Secondary | ICD-10-CM | POA: Diagnosis not present

## 2017-11-29 DIAGNOSIS — M1990 Unspecified osteoarthritis, unspecified site: Secondary | ICD-10-CM | POA: Diagnosis not present

## 2017-11-29 DIAGNOSIS — I10 Essential (primary) hypertension: Secondary | ICD-10-CM | POA: Diagnosis not present

## 2017-11-29 DIAGNOSIS — Z87891 Personal history of nicotine dependence: Secondary | ICD-10-CM | POA: Diagnosis not present

## 2017-11-29 DIAGNOSIS — I82409 Acute embolism and thrombosis of unspecified deep veins of unspecified lower extremity: Secondary | ICD-10-CM | POA: Diagnosis not present

## 2017-11-29 DIAGNOSIS — F419 Anxiety disorder, unspecified: Secondary | ICD-10-CM | POA: Diagnosis not present

## 2017-11-29 DIAGNOSIS — Z79899 Other long term (current) drug therapy: Secondary | ICD-10-CM | POA: Diagnosis not present

## 2017-11-29 DIAGNOSIS — J449 Chronic obstructive pulmonary disease, unspecified: Secondary | ICD-10-CM | POA: Diagnosis not present

## 2017-11-29 DIAGNOSIS — E785 Hyperlipidemia, unspecified: Secondary | ICD-10-CM | POA: Diagnosis not present

## 2017-11-29 NOTE — Progress Notes (Signed)
Daily Session Note  Patient Details  Name: Bethany [name] MRN: [id number] Date of Birth: [birth year] Referring Provider:     Pulmonary Rehab from 08/09/2017 in [facility] Cardiac and Pulmonary Rehab  Referring Provider  Tracie Harrier MD      Encounter Date: 11/29/2017  Check In: Session Check In - 11/29/17 1015      Check-In   Location  [facility] Pulmonary Re[facility]    Staff Present  Justin Mend RCP,RRT,BSRT;Amanda Oletta Darter, BA, ACSM CEP, Exercise Physiologist;Kelly Amedeo Plenty, BS, ACSM CEP, Exercise Physiologist    Supervising physician immediately available to respond to emergencies  [name] immediately available ER MD    Physician(s)  Dr. Jimmye Norman and Cinda Quest    Medication changes reported      No    Fall or balance concerns reported     No    Tobacco Cessation  No Change    Warm-up and Cool-down  Performed as group-led instruction    Resistance Training Performed  Yes    VAD Patient?  No      Pain Assessment   Currently in Pain?  No/denies          Social History   Tobacco Use  Smoking Status Former Smoker  . Packs/day: 1.50  . Years: 30.00  . Pack years: 45.00  . Types: Cigarettes  . Last attempt to quit: 10/06/2014  . Years since quitting: 3.1  Smokeless Tobacco Never Used    Goals Met:  Independence with exercise equipment Exercise tolerated well No report of cardiac concerns or symptoms Strength training completed today  Goals Unmet:  Not Applicable  Comments: Pt able to follow exercise prescription today without complaint.  Will continue to monitor for progression.   Dr. Emily Filbert is Medical Director for [place] and [name].

## 2017-12-01 DIAGNOSIS — E785 Hyperlipidemia, unspecified: Secondary | ICD-10-CM | POA: Diagnosis not present

## 2017-12-01 DIAGNOSIS — J449 Chronic obstructive pulmonary disease, unspecified: Secondary | ICD-10-CM | POA: Diagnosis not present

## 2017-12-01 DIAGNOSIS — Z79899 Other long term (current) drug therapy: Secondary | ICD-10-CM | POA: Diagnosis not present

## 2017-12-01 DIAGNOSIS — M1990 Unspecified osteoarthritis, unspecified site: Secondary | ICD-10-CM | POA: Diagnosis not present

## 2017-12-01 DIAGNOSIS — Z87891 Personal history of nicotine dependence: Secondary | ICD-10-CM | POA: Diagnosis not present

## 2017-12-01 DIAGNOSIS — I10 Essential (primary) hypertension: Secondary | ICD-10-CM | POA: Diagnosis not present

## 2017-12-01 DIAGNOSIS — F419 Anxiety disorder, unspecified: Secondary | ICD-10-CM | POA: Diagnosis not present

## 2017-12-01 DIAGNOSIS — Z7951 Long term (current) use of inhaled steroids: Secondary | ICD-10-CM | POA: Diagnosis not present

## 2017-12-01 DIAGNOSIS — Z7982 Long term (current) use of aspirin: Secondary | ICD-10-CM | POA: Diagnosis not present

## 2017-12-01 NOTE — Progress Notes (Signed)
Daily Session Note  Patient Details  Name: Bethany [name] MRN: [id number] Date of Birth: 09/26/1935 Referring Provider:     Pulmonary Rehab from 08/09/2017 in [facility] Cardiac and Pulmonary Rehab  Referring Provider  Tracie Harrier MD      Encounter Date: 12/01/2017  Check In: Session Check In - 12/01/17 1017      Check-In   Location  [facility] Pulmonary Re[facility]    Staff Present  Justin Mend Lorre Nick, MA, RCEP, CCRP, Exercise Physiologist;Amanda Oletta Darter, [place], ACSM CEP, Exercise Physiologist    Supervising physician immediately available to respond to emergencies  [name] immediately available ER MD    Physician(s)  Dr. Jimmye Norman and Quentin Cornwall    Medication changes reported      No    Fall or balance concerns reported     No    Tobacco Cessation  No Change    Warm-up and Cool-down  Performed as group-led instruction    Resistance Training Performed  Yes    VAD Patient?  No    PAD/SET Patient?  No      Pain Assessment   Currently in Pain?  No/denies          Social History   Tobacco Use  Smoking Status Former Smoker  . Packs/day: 1.50  . Years: 30.00  . Pack years: 45.00  . Types: Cigarettes  . Last attempt to quit: 10/06/2014  . Years since quitting: 3.1  Smokeless Tobacco Never Used    Goals Met:  Independence with exercise equipment Exercise tolerated well No report of cardiac concerns or symptoms Strength training completed today  Goals Unmet:  Not Applicable  Comments: Pt able to follow exercise prescription today without complaint.  Will continue to monitor for progression.   Dr. Emily Filbert is Medical Director for [place] and [name].

## 2017-12-02 DIAGNOSIS — R0902 Hypoxemia: Secondary | ICD-10-CM | POA: Diagnosis not present

## 2017-12-02 DIAGNOSIS — I10 Essential (primary) hypertension: Secondary | ICD-10-CM | POA: Diagnosis not present

## 2017-12-02 DIAGNOSIS — J9610 Chronic respiratory failure, unspecified whether with hypoxia or hypercapnia: Secondary | ICD-10-CM | POA: Diagnosis not present

## 2017-12-02 DIAGNOSIS — J449 Chronic obstructive pulmonary disease, unspecified: Secondary | ICD-10-CM | POA: Diagnosis not present

## 2017-12-03 DIAGNOSIS — Z87891 Personal history of nicotine dependence: Secondary | ICD-10-CM | POA: Diagnosis not present

## 2017-12-03 DIAGNOSIS — J449 Chronic obstructive pulmonary disease, unspecified: Secondary | ICD-10-CM | POA: Diagnosis not present

## 2017-12-03 DIAGNOSIS — Z79899 Other long term (current) drug therapy: Secondary | ICD-10-CM | POA: Diagnosis not present

## 2017-12-03 DIAGNOSIS — Z7951 Long term (current) use of inhaled steroids: Secondary | ICD-10-CM | POA: Diagnosis not present

## 2017-12-03 DIAGNOSIS — I10 Essential (primary) hypertension: Secondary | ICD-10-CM | POA: Diagnosis not present

## 2017-12-03 DIAGNOSIS — F419 Anxiety disorder, unspecified: Secondary | ICD-10-CM | POA: Diagnosis not present

## 2017-12-03 DIAGNOSIS — M1990 Unspecified osteoarthritis, unspecified site: Secondary | ICD-10-CM | POA: Diagnosis not present

## 2017-12-03 DIAGNOSIS — Z7982 Long term (current) use of aspirin: Secondary | ICD-10-CM | POA: Diagnosis not present

## 2017-12-03 DIAGNOSIS — E785 Hyperlipidemia, unspecified: Secondary | ICD-10-CM | POA: Diagnosis not present

## 2017-12-03 NOTE — Progress Notes (Signed)
Daily Session Note  Patient Details  Name: Bethany [name] MRN: [id number] Date of Birth: 06/07/1936 Referring Provider:     Pulmonary Rehab from 08/09/2017 in [facility] Cardiac and Pulmonary Rehab  Referring Provider  Tracie Harrier MD      Encounter Date: 12/03/2017  Check In: Session Check In - 12/03/17 0948      Check-In   Location  [facility]Cardiac [facility] Pulmonary Rehab    Staff Present  Justin Mend RCP,RRT,BSRT;Amanda Oletta Darter, BA, ACSM CEP, Exercise Physiologist;Meredith Sherryll Burger, RN BSN    Supervising physician immediately available to respond to emergencies  [name] immediately available ER MD    Physician(s)  Dr. Cherylann Banas and Jacqualine Code    Medication changes reported      No    Fall or balance concerns reported     No    Tobacco Cessation  No Change    Warm-up and Cool-down  Performed as group-led instruction    Resistance Training Performed  Yes    VAD Patient?  No    PAD/SET Patient?  No      Pain Assessment   Currently in Pain?  No/denies          Social History   Tobacco Use  Smoking Status Former Smoker  . Packs/day: 1.50  . Years: 30.00  . Pack years: 45.00  . Types: Cigarettes  . Last attempt to quit: 10/06/2014  . Years since quitting: 3.1  Smokeless Tobacco Never Used    Goals Met:  Independence with exercise equipment Exercise tolerated well No report of cardiac concerns or symptoms Strength training completed today  Goals Unmet:  Not Applicable  Comments: Pt able to follow exercise prescription today without complaint.  Will continue to monitor for progression.   Dr. Emily Filbert is Medical Director for [place] and [name].

## 2017-12-06 ENCOUNTER — Encounter: Payer: Medicare HMO | Attending: Internal Medicine

## 2017-12-06 DIAGNOSIS — Z7951 Long term (current) use of inhaled steroids: Secondary | ICD-10-CM | POA: Diagnosis not present

## 2017-12-06 DIAGNOSIS — J449 Chronic obstructive pulmonary disease, unspecified: Secondary | ICD-10-CM | POA: Diagnosis not present

## 2017-12-06 DIAGNOSIS — Z7982 Long term (current) use of aspirin: Secondary | ICD-10-CM | POA: Diagnosis not present

## 2017-12-06 DIAGNOSIS — E785 Hyperlipidemia, unspecified: Secondary | ICD-10-CM | POA: Diagnosis not present

## 2017-12-06 DIAGNOSIS — Z79899 Other long term (current) drug therapy: Secondary | ICD-10-CM | POA: Insufficient documentation

## 2017-12-06 DIAGNOSIS — F419 Anxiety disorder, unspecified: Secondary | ICD-10-CM | POA: Insufficient documentation

## 2017-12-06 DIAGNOSIS — Z87891 Personal history of nicotine dependence: Secondary | ICD-10-CM | POA: Insufficient documentation

## 2017-12-06 DIAGNOSIS — I10 Essential (primary) hypertension: Secondary | ICD-10-CM | POA: Insufficient documentation

## 2017-12-06 DIAGNOSIS — M1990 Unspecified osteoarthritis, unspecified site: Secondary | ICD-10-CM | POA: Insufficient documentation

## 2017-12-06 NOTE — Progress Notes (Signed)
Pulmonary Individual Treatment Plan  Patient Details  Name: Bethany [name] MRN: [id number] Date of Birth: 1936-04-22 Referring Provider:     Pulmonary Rehab from 08/09/2017 in [facility] Cardiac and Pulmonary Rehab  Referring Provider  Tracie Harrier MD      Initial Encounter Date:    Pulmonary Rehab from 08/09/2017 in [facility] Cardiac and Pulmonary Rehab  Date  08/09/17      Visit Diagnosis: Chronic obstructive pulmonary disease, unspecified COPD type ([place])  Patient's Home Medications on Admission:  Current Outpatient Medications:  .  albuterol (PROVENTIL HFA;VENTOLIN HFA) 108 (90 Base) MCG/ACT inhaler, Inhale 2 puffs into the lungs every 6 (six) hours as needed for wheezing or shortness of breath., Disp: 1 Inhaler, Rfl: 2 .  aspirin (ASPIRIN EC) 81 MG EC tablet, Take 81 mg by mouth daily. Swallow whole., Disp: , Rfl:  .  baclofen (LIORESAL) 10 MG tablet, Take 10 mg by mouth 3 (three) times daily., Disp: , Rfl:  .  CALCIUM CARBONATE PO, Take 1 tablet by mouth daily., Disp: , Rfl:  .  cetirizine (ZYRTEC) 10 MG tablet, Take 10 mg by mouth daily., Disp: , Rfl:  .  Cholecalciferol (VITAMIN D3) 1000 units CAPS, Take 1 capsule by mouth daily., Disp: , Rfl:  .  diazepam (VALIUM) 5 MG tablet, Take 5 mg by mouth at bedtime. Patient takes once at night Reported on 10/16/2015, Disp: , Rfl:  .  docusate sodium (COLACE) 100 MG capsule, Take 100 mg by mouth 2 (two) times daily as needed for mild constipation., Disp: , Rfl:  .  escitalopram (LEXAPRO) 5 MG tablet, Take 10 mg by mouth daily. , Disp: , Rfl:  .  Fluticasone-Salmeterol (ADVAIR) 250-50 MCG/DOSE AEPB, Inhale 1 puff into the lungs 2 (two) times daily., Disp: , Rfl:  .  gabapentin (NEURONTIN) 300 MG capsule, Take 300 mg by mouth 3 (three) times daily., Disp: , Rfl:  .  HYDROcodone-acetaminophen (NORCO/VICODIN) 5-325 MG tablet, Take 1 tablet by mouth 5 (five) times daily as needed for moderate pain. Do not exceed 5 times daily, patient at least 3 day,  Disp: , Rfl:  .  losartan (COZAAR) 50 MG tablet, Take 50 mg by mouth daily., Disp: , Rfl:  .  lovastatin (MEVACOR) 20 MG tablet, Take 20 mg by mouth at bedtime. Reported on 12/12/2015, Disp: , Rfl:  .  metoprolol succinate (TOPROL-XL) 25 MG 24 hr tablet, Take 25 mg by mouth daily., Disp: , Rfl:  .  montelukast (SINGULAIR) 10 MG tablet, Take 10 mg by mouth at bedtime. Reported on 12/12/2015, Disp: , Rfl:  .  tamsulosin (FLOMAX) 0.4 MG CAPS capsule, Take 0.4 mg by mouth daily after supper., Disp: , Rfl:  .  vitamin B-12 (CYANOCOBALAMIN) 1000 MCG tablet, Take 1,000 mcg by mouth daily., Disp: , Rfl:   Past Medical History: Past Medical History:  Diagnosis Date  . Allergy   . Anxiety   . Arthritis   . COPD (chronic obstructive pulmonary disease) ([place])   . Hyperlipidemia   . Hypertension   . Oxygen deficiency     Tobacco Use: Social History   Tobacco Use  Smoking Status Former Smoker  . Packs/day: 1.50  . Years: 30.00  . Pack years: 45.00  . Types: Cigarettes  . Last attempt to quit: 10/06/2014  . Years since quitting: 3.1  Smokeless Tobacco Never Used    Labs: Recent Review Flowsheet Data    There is no flowsheet data to display.  Pulmonary Assessment Scores: Pulmonary Assessment Scores    Row Name 08/09/17 1425         ADL UCSD   ADL Phase  Entry     SOB Score total  63     Rest  0     Walk  3     Stairs  5     Bath  0     Dress  1     Shop  2       CAT Score   CAT Score  26       mMRC Score   mMRC Score  3        Pulmonary Function Assessment: Pulmonary Function Assessment - 08/09/17 1446      Initial Spirometry Results   FVC%  36 %    FEV1%  30 %    FEV1/FVC Ratio  62.78    Comments  Good patient effort      Post Bronchodilator Spirometry Results   FVC%  43.92 %    FEV1%  32.1 %    FEV1/FVC Ratio  55.17    Comments  Good patient effort      Breath   Bilateral Breath Sounds  Clear;Decreased    Shortness of Breath  Limiting activity;Panic  with Shortness of Breath;Yes       Exercise Target Goals:    Exercise Program Goal: Individual exercise prescription set using results from initial 6 min walk test and THRR while considering  patient's activity barriers and safety.    Exercise Prescription Goal: Initial exercise prescription builds to 30-45 minutes a day of aerobic activity, 2-3 days per week.  Home exercise guidelines will be given to patient during program as part of exercise prescription that the participant will acknowledge.  Activity Barriers & Risk Stratification: Activity Barriers & Cardiac Risk Stratification - 08/09/17 1630      Activity Barriers & Cardiac Risk Stratification   Activity Barriers  Back Problems;Deconditioning;Muscular Weakness;Shortness of Breath 8+ surgeries on her back       6 Minute Walk: 6 Minute Walk    Row Name 08/09/17 1627         6 Minute Walk   Phase  Initial     Distance  680 feet     Walk Time  4.77 minutes     # of Rest Breaks  1 1:14     MPH  1.62     METS  2.03     RPE  17     Perceived Dyspnea   3     VO2 Peak  7.11     Symptoms  Yes (comment)     Comments  back pain 7/10, breathing became painful     Resting HR  76 bpm     Resting BP  146/64     Resting Oxygen Saturation   97 %     Exercise Oxygen Saturation  during 6 min walk  86 %     Max Ex. HR  115 bpm     Max Ex. BP  194/104     2 Minute Post BP  174/76 rck 154/70       Interval HR   1 Minute HR  109     2 Minute HR  115     3 Minute HR  110     4 Minute HR  98     5 Minute HR  112     6 Minute HR  115  2 Minute Post HR  82     Interval Heart Rate?  Yes       Interval Oxygen   Interval Oxygen?  Yes     Baseline Oxygen Saturation %  97 %     1 Minute Oxygen Saturation %  91 %     1 Minute Liters of Oxygen  3 L pulsed     2 Minute Oxygen Saturation %  89 %     2 Minute Liters of Oxygen  3 L     3 Minute Oxygen Saturation %  86 % rest break 2:26-3:40     3 Minute Liters of Oxygen  3 L      4 Minute Oxygen Saturation %  93 %     4 Minute Liters of Oxygen  3 L     5 Minute Oxygen Saturation %  89 %     5 Minute Liters of Oxygen  3 L     6 Minute Oxygen Saturation %  86 %     6 Minute Liters of Oxygen  3 L     2 Minute Post Oxygen Saturation %  96 %     2 Minute Post Liters of Oxygen  3 L       Oxygen Initial Assessment: Oxygen Initial Assessment - 08/09/17 1436      Home Oxygen   Home Oxygen Device  Portable Concentrator;Home Concentrator;E-Tanks    Sleep Oxygen Prescription  Continuous    Liters per minute  3    Home Exercise Oxygen Prescription  Continuous    Liters per minute  3    Home at Rest Exercise Oxygen Prescription  Continuous    Liters per minute  3      Initial 6 min Walk   Oxygen Used  Pulsed    Liters per minute  3      Program Oxygen Prescription   Program Oxygen Prescription  Continuous    Liters per minute  3      Intervention   Short Term Goals  To learn and exhibit compliance with exercise, home and travel O2 prescription;To learn and understand importance of maintaining oxygen saturations>88%;To learn and demonstrate proper use of respiratory medications;To learn and demonstrate proper pursed lip breathing techniques or other breathing techniques.;To learn and understand importance of monitoring SPO2 with pulse oximeter and demonstrate accurate use of the pulse oximeter.    Long  Term Goals  Exhibits compliance with exercise, home and travel O2 prescription;Verbalizes importance of monitoring SPO2 with pulse oximeter and return demonstration;Maintenance of O2 saturations>88%;Exhibits proper breathing techniques, such as pursed lip breathing or other method taught during program session;Compliance with respiratory medication;Demonstrates proper use of MDI's       Oxygen Re-Evaluation: Oxygen Re-Evaluation    Row Name 08/20/17 1008 09/22/17 1147 09/22/17 1148 11/24/17 1056       Program Oxygen Prescription   Program Oxygen Prescription  -   Continuous  -  Continuous    Liters per minute  -  3  3  3     Comments  -  -  -  Bethany [name] is going to try to exercise on 2 liters of oxygen      Home Oxygen   Home Oxygen Device  -  Portable Concentrator;Home Concentrator;E-Tanks  Portable Concentrator;Home Concentrator;E-Tanks  Portable Concentrator;Home Concentrator;E-Tanks    Sleep Oxygen Prescription  -  CPAP  -  CPAP    Liters per minute  -  3  3  3    Home Exercise Oxygen Prescription  -  Continuous  Continuous  Continuous    Liters per minute  -  3  3  3     Home at Rest Exercise Oxygen Prescription  -  -  Continuous  Continuous    Liters per minute  -  3  3  3     Compliance with Home Oxygen Use  -  -  Yes  Yes      Goals/Expected Outcomes   Short Term Goals  To learn and exhibit compliance with exercise, home and travel O2 prescription;To learn and understand importance of maintaining oxygen saturations>88%;To learn and demonstrate proper use of respiratory medications;To learn and demonstrate proper pursed lip breathing techniques or other breathing techniques.;To learn and understand importance of monitoring SPO2 with pulse oximeter and demonstrate accurate use of the pulse oximeter.  -  To learn and exhibit compliance with exercise, home and travel O2 prescription;To learn and understand importance of monitoring SPO2 with pulse oximeter and demonstrate accurate use of the pulse oximeter.;To learn and understand importance of maintaining oxygen saturations>88%;To learn and demonstrate proper pursed lip breathing techniques or other breathing techniques.;To learn and demonstrate proper use of respiratory medications;Other  To learn and exhibit compliance with exercise, home and travel O2 prescription;To learn and understand importance of monitoring SPO2 with pulse oximeter and demonstrate accurate use of the pulse oximeter.;To learn and understand importance of maintaining oxygen saturations>88%;To learn and demonstrate proper pursed lip  breathing techniques or other breathing techniques.;To learn and demonstrate proper use of respiratory medications;Other    Long  Term Goals  Exhibits compliance with exercise, home and travel O2 prescription;Verbalizes importance of monitoring SPO2 with pulse oximeter and return demonstration;Maintenance of O2 saturations>88%;Exhibits proper breathing techniques, such as pursed lip breathing or other method taught during program session;Compliance with respiratory medication;Demonstrates proper use of MDI's  -  Exhibits compliance with exercise, home and travel O2 prescription;Verbalizes importance of monitoring SPO2 with pulse oximeter and return demonstration;Maintenance of O2 saturations>88%;Exhibits proper breathing techniques, such as pursed lip breathing or other method taught during program session;Compliance with respiratory medication;Demonstrates proper use of MDI's  Exhibits compliance with exercise, home and travel O2 prescription;Verbalizes importance of monitoring SPO2 with pulse oximeter and return demonstration;Maintenance of O2 saturations>88%;Exhibits proper breathing techniques, such as pursed lip breathing or other method taught during program session;Compliance with respiratory medication;Demonstrates proper use of MDI's    Comments  Reviewed PLB technique with pt.  Talked about how it work and it's important to maintaining his exercise saturations.    -  Pt using O2 as prescribed  Bethany [name] is going to try to exercise on 2 liters of oxygen. She checks her oxygen at home and knows the importance of being 88 percent and above. She is doing well with her PLB and uses the technique when she gets short of breath.    Goals/Expected Outcomes  Short: Become more profiecient at using PLB.   Long: Become independent at using PLB.  -  Short - continue to manage SOB and maintain O2 >88%  Long - Pt maintains compliance with 02 and complies with meds   Short: use 2 liters while exercising. Long:  decrease oxygen to 1 liter while exercising.       Oxygen Discharge (Final Oxygen Re-Evaluation): Oxygen Re-Evaluation - 11/24/17 1056      Program Oxygen Prescription   Program Oxygen Prescription  Continuous    Liters per minute  3  Comments  Bethany [name] is going to try to exercise on 2 liters of oxygen      Home Oxygen   Home Oxygen Device  Portable Concentrator;Home Concentrator;E-Tanks    Sleep Oxygen Prescription  CPAP    Liters per minute  3    Home Exercise Oxygen Prescription  Continuous    Liters per minute  3    Home at Rest Exercise Oxygen Prescription  Continuous    Liters per minute  3    Compliance with Home Oxygen Use  Yes      Goals/Expected Outcomes   Short Term Goals  To learn and exhibit compliance with exercise, home and travel O2 prescription;To learn and understand importance of monitoring SPO2 with pulse oximeter and demonstrate accurate use of the pulse oximeter.;To learn and understand importance of maintaining oxygen saturations>88%;To learn and demonstrate proper pursed lip breathing techniques or other breathing techniques.;To learn and demonstrate proper use of respiratory medications;Other    Long  Term Goals  Exhibits compliance with exercise, home and travel O2 prescription;Verbalizes importance of monitoring SPO2 with pulse oximeter and return demonstration;Maintenance of O2 saturations>88%;Exhibits proper breathing techniques, such as pursed lip breathing or other method taught during program session;Compliance with respiratory medication;Demonstrates proper use of MDI's    Comments  Bethany [name] is going to try to exercise on 2 liters of oxygen. She checks her oxygen at home and knows the importance of being 88 percent and above. She is doing well with her PLB and uses the technique when she gets short of breath.    Goals/Expected Outcomes  Short: use 2 liters while exercising. Long: decrease oxygen to 1 liter while exercising.       Initial Exercise  Prescription: Initial Exercise Prescription - 08/09/17 1600      Date of Initial Exercise RX and Referring Provider   Date  08/09/17    Referring Provider  Tracie Harrier MD      Oxygen   Oxygen  Continuous    Liters  3      Treadmill   MPH  1.3    Grade  0.5    Minutes  15    METs  2.09      NuStep   Level  1    SPM  80    Minutes  15    METs  2      Biostep-RELP   Level  1    SPM  50    Minutes  15    METs  2      Prescription Details   Frequency (times per week)  3    Duration  Progress to 45 minutes of aerobic exercise without signs/symptoms of physical distress      Intensity   THRR 40-80% of Max Heartrate  102-128    Ratings of Perceived Exertion  11-13    Perceived Dyspnea  0-4      Progression   Progression  Continue to progress workloads to maintain intensity without signs/symptoms of physical distress.      Resistance Training   Training Prescription  Yes    Weight  3 lbs    Reps  10-15       Perform Capillary Blood Glucose checks as needed.  Exercise Prescription Changes: Exercise Prescription Changes    Row Name 08/09/17 1600 09/01/17 1100 09/15/17 1200 09/29/17 1200 10/13/17 1400     Response to Exercise   Blood Pressure (Admit)  146/64  142/74  118/58  122/60  126/58  Blood Pressure (Exercise)  194/104  -  -  -  -   Blood Pressure (Exit)  154/70  124/68  112/61  132/64  116/56   Heart Rate (Admit)  76 bpm  47 bpm  66 bpm  76 bpm  72 bpm   Heart Rate (Exercise)  115 bpm  88 bpm  85 bpm  88 bpm  71 bpm   Heart Rate (Exit)  82 bpm  72 bpm  71 bpm  63 bpm  69 bpm   Oxygen Saturation (Admit)  97 %  92 %  99 %  89 %  94 %   Oxygen Saturation (Exercise)  86 %  91 %  95 %  90 %  90 %   Oxygen Saturation (Exit)  96 %  96 %  100 %  90 %  98 %   Rating of Perceived Exertion (Exercise)  17  15  11  13  15    Perceived Dyspnea (Exercise)  3  3  2  2  2    Symptoms  back pain 7/10, painful breathing  -  -  -  -   Comments  walk test results  -  -   -  -   Duration  -  Progress to 45 minutes of aerobic exercise without signs/symptoms of physical distress  Progress to 45 minutes of aerobic exercise without signs/symptoms of physical distress  Continue with 45 min of aerobic exercise without signs/symptoms of physical distress.  Continue with 45 min of aerobic exercise without signs/symptoms of physical distress.   Intensity  -  THRR unchanged  THRR unchanged  Other (comment) reaching RPE not HR goals - meds/back pain  Other (comment)     Progression   Progression  -  Continue to progress workloads to maintain intensity without signs/symptoms of physical distress.  Continue to progress workloads to maintain intensity without signs/symptoms of physical distress.  Continue to progress workloads to maintain intensity without signs/symptoms of physical distress.  Continue to progress workloads to maintain intensity without signs/symptoms of physical distress.   Average METs  -  1.75  2  2  2      Resistance Training   Training Prescription  -  Yes  Yes  Yes  Yes   Weight  -  3 lb  3 lb  3 lb  3 lb   Reps  -  10-15  10-15  10-15  10-15     Interval Training   Interval Training  -  No  No  No  No     Oxygen   Oxygen  -  Continuous  Continuous  Continuous  Continuous   Liters  -  3  3  3  3      Treadmill   MPH  -  1.3  1.3  1.3  1.3   Grade  -  0.5  0  0  0   Minutes  -  15  15  15  15    METs  -  2.09  2  2  2      NuStep   Level  -  3  -  -  3   SPM  -  80  -  -  80   Minutes  -  15  -  -  15   METs  -  1.5  -  -  1.9     Biostep-RELP   Level  -  -  1  2  2   SPM  -  -  33  28  30   Minutes  -  -  15  15  15    METs  -  -  2  2  2      Home Exercise Plan   Plans to continue exercise at  -  Home (comment) walk outside  Home (comment) walk outside  Home (comment) walk outside  Home (comment) walk outside   Frequency  -  Add 1 additional day to program exercise sessions.  Add 1 additional day to program exercise sessions.  Add 1  additional day to program exercise sessions.  Add 1 additional day to program exercise sessions.   Initial Home Exercises Provided  -  09/01/17  09/01/17  09/01/17  09/01/17   Row Name 10/26/17 1300 11/08/17 1600 11/24/17 1500         Response to Exercise   Blood Pressure (Admit)  134/74  122/62  116/60     Blood Pressure (Exit)  110/70  120/58  126/74     Heart Rate (Admit)  79 bpm  72 bpm  70 bpm     Heart Rate (Exercise)  77 bpm  81 bpm  85 bpm     Heart Rate (Exit)  63 bpm  61 bpm  75 bpm     Oxygen Saturation (Admit)  85 %  95 %  97 %     Oxygen Saturation (Exercise)  94 %  95 %  92 %     Oxygen Saturation (Exit)  98 %  98 %  93 %     Rating of Perceived Exertion (Exercise)  13  13  15      Perceived Dyspnea (Exercise)  2  3  3      Duration  Continue with 45 min of aerobic exercise without signs/symptoms of physical distress.  Continue with 45 min of aerobic exercise without signs/symptoms of physical distress.  Continue with 45 min of aerobic exercise without signs/symptoms of physical distress.     Intensity  Other (comment) RPE 13-15  Other (comment)  Other (comment) reaching RPE goals       Progression   Progression  Continue to progress workloads to maintain intensity without signs/symptoms of physical distress.  Continue to progress workloads to maintain intensity without signs/symptoms of physical distress.  Continue to progress workloads to maintain intensity without signs/symptoms of physical distress.     Average METs  2  2  -       Resistance Training   Training Prescription  Yes  Yes  -     Weight  3 lb  3 lb  -     Reps  10-15  10-15  -       Interval Training   Interval Training  No  No  -       Oxygen   Oxygen  Continuous  Continuous  -     Liters  3  3  -       Treadmill   MPH  -  1.3  1.3     Grade  -  0.5  0     Minutes  -  15  15     METs  -  2  2       NuStep   Level  -  4  -     SPM  -  64  -     Minutes  -  15  -     METs  -  1.5  -        Biostep-RELP   Level  3  -  3     Minutes  15  -  15     METs  2  -  2       Home Exercise Plan   Plans to continue exercise at  Home (comment) walk outside  Home (comment) walk outside  Home (comment) walk outside     Frequency  Add 1 additional day to program exercise sessions.  Add 1 additional day to program exercise sessions.  Add 1 additional day to program exercise sessions.        Exercise Comments: Exercise Comments    Row Name 08/20/17 1007           Exercise Comments  First full day of exercise!  Patient was oriented to gym and equipment including functions, settings, policies, and procedures.  Patient's individual exercise prescription and treatment plan were reviewed.  All starting workloads were established based on the results of the 6 minute walk test done at initial orientation visit.  The plan for exercise progression was also introduced and progression will be customized based on patient's performance and goals.          Exercise Goals and Review: Exercise Goals    Row Name 08/09/17 1633             Exercise Goals   Increase Physical Activity  Yes       Intervention  Provide advice, education, support and counseling about physical activity/exercise needs.;Develop an individualized exercise prescription for aerobic and resistive training based on initial evaluation findings, risk stratification, comorbidities and participant's personal goals.       Expected Outcomes  Short Term: Attend rehab on a regular basis to increase amount of physical activity.;Long Term: Add in home exercise to make exercise part of routine and to increase amount of physical activity.;Long Term: Exercising regularly at least 3-5 days a week.       Increase Strength and Stamina  Yes       Intervention  Provide advice, education, support and counseling about physical activity/exercise needs.;Develop an individualized exercise prescription for aerobic and resistive training based on initial  evaluation findings, risk stratification, comorbidities and participant's personal goals.       Expected Outcomes  Short Term: Increase workloads from initial exercise prescription for resistance, speed, and METs.;Short Term: Perform resistance training exercises routinely during rehab and add in resistance training at home;Long Term: Improve cardiorespiratory fitness, muscular endurance and strength as measured by increased METs and functional capacity (6MWT)       Able to understand and use rate of perceived exertion (RPE) scale  Yes       Intervention  Provide education and explanation on how to use RPE scale       Expected Outcomes  Short Term: Able to use RPE daily in rehab to express subjective intensity level;Long Term:  Able to use RPE to guide intensity level when exercising independently       Able to understand and use Dyspnea scale  Yes       Intervention  Provide education and explanation on how to use Dyspnea scale       Expected Outcomes  Short Term: Able to use Dyspnea scale daily in rehab to express subjective sense of shortness of breath during exertion;Long Term: Able to use Dyspnea scale to guide intensity level  when exercising independently       Knowledge and understanding of Target Heart Rate Range (THRR)  Yes       Intervention  Provide education and explanation of THRR including how the numbers were predicted and where they are located for reference       Expected Outcomes  Short Term: Able to state/look up THRR;Long Term: Able to use THRR to govern intensity when exercising independently;Short Term: Able to use daily as guideline for intensity in rehab       Able to check pulse independently  Yes       Intervention  Provide education and demonstration on how to check pulse in carotid and radial arteries.;Review the importance of being able to check your own pulse for safety during independent exercise       Expected Outcomes  Short Term: Able to explain why pulse checking is  important during independent exercise;Long Term: Able to check pulse independently and accurately       Understanding of Exercise Prescription  Yes       Intervention  Provide education, explanation, and written materials on patient's individual exercise prescription       Expected Outcomes  Short Term: Able to explain program exercise prescription;Long Term: Able to explain home exercise prescription to exercise independently          Exercise Goals Re-Evaluation : Exercise Goals Re-Evaluation    Row Name 08/20/17 1007 09/01/17 1145 09/15/17 1228 09/29/17 1257 10/13/17 1428     Exercise Goal Re-Evaluation   Exercise Goals Review  Understanding of Exercise Prescription;Able to understand and use Dyspnea scale;Knowledge and understanding of Target Heart Rate Range (THRR);Able to understand and use rate of perceived exertion (RPE) scale  Increase Physical Activity;Able to understand and use rate of perceived exertion (RPE) scale;Increase Strength and Stamina;Able to understand and use Dyspnea scale;Knowledge and understanding of Target Heart Rate Range (THRR)  Increase Physical Activity;Able to understand and use rate of perceived exertion (RPE) scale;Increase Strength and Stamina;Able to understand and use Dyspnea scale  Increase Physical Activity;Able to understand and use rate of perceived exertion (RPE) scale;Increase Strength and Stamina;Able to understand and use Dyspnea scale  Increase Physical Activity;Able to understand and use rate of perceived exertion (RPE) scale;Knowledge and understanding of Target Heart Rate Range (THRR);Increase Strength and Stamina;Able to understand and use Dyspnea scale   Comments  Reviewed RPE scale, THR and program prescription with pt today.  Pt voiced understanding and was given a copy of goals to take home.   Home exercise reviewed with Pt including HR, RPE, O2.  Bethany [name] still has back pain on TM.  Staff encouraged good posture and daily movement to strengthen core  and back muscles.  Bethany [name] had injections for her back pain last week and states it is less painful.  Staff remind her to use good posture on TM and at home to help build core strength.  The back pain seems to affect her exercise progress as much as breathing.  Bethany [name] has increased resistance on the biostep. Staff will encourage increasing on NS and TM.  Pt tolerates exercise well.  Back pain keeps her from progressing more.     Expected Outcomes  Short: Use RPE daily to regulate intensity.  Long: Follow program prescription in THR.  Short - Bethany [name] will walk one day at home in addition to program sessions Bethany [name] will be able to walk without back pain for 15 min on TM  Short - Pt will  work on posture and core strength on machines Long - pt will be able to exercise with minimal back pain  Short - Bethany [name] will continue to attend regularly Long - Bethany [name] will improve overall MET level  Short - Pt will continue to attend classes Long - Pt will build endurance and maintain fitness    Row Name 10/26/17 1306 11/08/17 1635 11/24/17 1421 11/24/17 1511       Exercise Goal Re-Evaluation   Exercise Goals Review  Increase Physical Activity;Able to understand and use rate of perceived exertion (RPE) scale;Increase Strength and Stamina  Increase Physical Activity;Able to understand and use rate of perceived exertion (RPE) scale;Increase Strength and Stamina  Increase Physical Activity;Increase Strength and Stamina  Increase Physical Activity;Increase Strength and Stamina;Able to understand and use Dyspnea scale;Able to understand and use rate of perceived exertion (RPE) scale    Comments  Bethany [name] missed a week due to non health related issues.  Regular attendance three times per week would improve progress.  Bethany [name] has increased resistance on NS but not overall MET level.  Her back pain affects her walking on TM.  Bethany [name] states that she is walking more at home around the neighborhood. She is doing more yard work  and and getting less short of breath.  Bethany [name] does as much as she can tolerate considering back pain limitations    Expected Outcomes  Short - Bethany [name] will attend 3 dyas per week Long - Bethany [name] will improve MET levels  Short - staff will review SPM for Bethany [name] to increase METs on NS Long - Pt will increase MET level  Short: add walking to her daily rountine. Long: walk everyday at home.  Short - Sariya will contineu to attend regularly Long - Bethany [name] will maintain fitness on her own       Discharge Exercise Prescription (Final Exercise Prescription Changes): Exercise Prescription Changes - 11/24/17 1500      Response to Exercise   Blood Pressure (Admit)  116/60    Blood Pressure (Exit)  126/74    Heart Rate (Admit)  70 bpm    Heart Rate (Exercise)  85 bpm    Heart Rate (Exit)  75 bpm    Oxygen Saturation (Admit)  97 %    Oxygen Saturation (Exercise)  92 %    Oxygen Saturation (Exit)  93 %    Rating of Perceived Exertion (Exercise)  15    Perceived Dyspnea (Exercise)  3    Duration  Continue with 45 min of aerobic exercise without signs/symptoms of physical distress.    Intensity  Other (comment) reaching RPE goals      Progression   Progression  Continue to progress workloads to maintain intensity without signs/symptoms of physical distress.      Treadmill   MPH  1.3    Grade  0    Minutes  15    METs  2      Biostep-RELP   Level  3    Minutes  15    METs  2      Home Exercise Plan   Plans to continue exercise at  Home (comment) walk outside    Frequency  Add 1 additional day to program exercise sessions.       Nutrition:  Target Goals: Understanding of nutrition guidelines, daily intake of sodium <1569m, cholesterol <2027m calories 30% from fat and 7% or less from saturated fats, daily to have 5 or more servings of fruits and vegetables.  Biometrics: Pre  Biometrics - 08/09/17 1633      Pre Biometrics   Height  5' 4.1" (1.628 m)    Weight  152 lb 6.4 oz (69.1 kg)     Waist Circumference  31 inches    Hip Circumference  39 inches    Waist to Hip Ratio  0.79 %    BMI (Calculated)  26.08        Nutrition Therapy Plan and Nutrition Goals: Nutrition Therapy & Goals - 10/27/17 1239      Nutrition Therapy   RD appointment deferred  Yes       Nutrition Assessments: Nutrition Assessments - 08/09/17 1428      MEDFICTS Scores   Pre Score  24       Nutrition Goals Re-Evaluation: Nutrition Goals Re-Evaluation    [name] Name 11/24/17 1414             Goals   Current Weight  147 lb (66.7 kg)       Nutrition Goal  Maintain weight and eat healthier meals.       Comment  Bethany [name] wants to lose weight and eat a healthier diet. She wants to meet witht he dietician.       Expected Outcome  Short: make a dietician appointment. Long: adhere to a diet plan.          Nutrition Goals Discharge (Final Nutrition Goals Re-Evaluation): Nutrition Goals Re-Evaluation - 11/24/17 1414      Goals   Current Weight  147 lb (66.7 kg)    Nutrition Goal  Maintain weight and eat healthier meals.    Comment  Bethany [name] wants to lose weight and eat a healthier diet. She wants to meet witht he dietician.    Expected Outcome  Short: make a dietician appointment. Long: adhere to a diet plan.       Psychosocial: Target Goals: Acknowledge presence or absence of significant depression and/or stress, maximize coping skills, provide positive support system. Participant is able to verbalize types and ability to use techniques and skills needed for reducing stress and depression.   Initial Review & Psychosocial Screening: Initial Psych Review & Screening - 08/09/17 1433      Initial Review   Current issues with  Current Anxiety/Panic      Family Dynamics   Good Support System?  Yes    Comments  She can turn to her brother for support.      Barriers   Psychosocial barriers to participate in program  The patient should benefit from training in stress management and  relaxation.      Screening Interventions   Interventions  Encouraged to exercise;To provide support and resources with identified psychosocial needs;Program counselor consult;Provide feedback about the scores to participant    Expected Outcomes  Short Term goal: Utilizing psychosocial counselor, staff and physician to assist with identification of specific Stressors or current issues interfering with healing process. Setting desired goal for each stressor or current issue identified.;Long Term Goal: Stressors or current issues are controlled or eliminated.;Short Term goal: Identification and review with participant of any Quality of Life or Depression concerns found by scoring the questionnaire.;Long Term goal: The participant improves quality of Life and PHQ9 Scores as seen by post scores and/or verbalization of changes       Quality of Life Scores:  Scores of 19 and below usually indicate a poorer quality of life in these areas.  A difference of  2-3 points is a clinically meaningful difference.  A  difference of 2-3 points in the total score of the Quality of Life Index has been associated with significant improvement in overall quality of life, self-image, physical symptoms, and general health in studies assessing change in quality of life.  PHQ-9: Recent Review Flowsheet Data    Depression screen [facility] 2/9 08/09/2017 04/28/2016 01/01/2016 12/12/2015 11/12/2015   Decreased Interest 1 2 0 0 0   Down, Depressed, Hopeless 0 1 0 0 0   PHQ - 2 Score 1 3 0 0 0   Altered sleeping 2 2 - - -   Tired, decreased energy 2 3 - - -   Change in appetite 3 3 - - -   Feeling bad or failure about yourself  3 1 - - -   Trouble concentrating 0 2 - - -   Moving slowly or fidgety/restless 0 0 - - -   Suicidal thoughts 0 0 - - -   PHQ-9 Score 11 14 - - -   Difficult doing work/chores Very difficult Somewhat difficult - - -     Interpretation of Total Score  Total Score Depression Severity:  1-4 = Minimal  depression, 5-9 = Mild depression, 10-14 = Moderate depression, 15-19 = Moderately severe depression, 20-27 = Severe depression   Psychosocial Evaluation and Intervention: Psychosocial Evaluation - 08/23/17 1027      Psychosocial Evaluation & Interventions   Interventions  Encouraged to exercise with the program and follow exercise prescription    Comments  Ms Fryer Bethany [name]) returned to Pulmonary Rehab recently due to continued difficulty breathing with the COPD.  Counselor met with her for initial psychosocial evaluation.  She has a strong support system with a brother close by; her local church and neighbors.  Janiyah reports sleeping well currently but continues to have a poor appetite.  She denies depression or anxiety symptoms or a history.  She has a PHQ-9 of "11" indicating moderate depression and discussed this with Bethany [name].  She states her mood is typically positive - it's just her health issues and shortness of breath that impact her quality of life.  Her goals are to breathe better and increase her energy while in this program - in order to return to normal activities.  Staff will follow with Bethany [name] throughout the course of this program.      Expected Outcomes  Bethany [name] will exercise consistently to achieve her stated goals.   The educational and psychoeducational components of this program will be helpful for understanding and managing her condition more positively.      Continue Psychosocial Services   Follow up required by staff       Psychosocial Re-Evaluation: Psychosocial Re-Evaluation    [place] Name 11/24/17 1418             Psychosocial Re-Evaluation   Current issues with  Current Anxiety/Panic       Comments  Sometimes when Arieon gets short of breath she gets anxious but not as much since she has been working on PLB. Her brother comes to see her at [place] once and week and talks to him everyday. She likes coming to exercise at [facility] since it makes her feel better.         Expected Outcomes  Short: attend [name] regularly. Long: Graduate [name] and maintain exercise.       Interventions  Encouraged to attend Pulmonary Rehabilitation for the exercise       Continue Psychosocial Services   Follow up required by staff  Psychosocial Discharge (Final Psychosocial Re-Evaluation): Psychosocial Re-Evaluation - 11/24/17 1418      Psychosocial Re-Evaluation   Current issues with  Current Anxiety/Panic    Comments  Sometimes when Briahna gets short of breath she gets anxious but not as much since she has been working on PLB. Her brother comes to see her at [place] once and week and talks to him everyday. She likes coming to exercise at [facility] since it makes her feel better.     Expected Outcomes  Short: attend [name] regularly. Long: Graduate [name] and maintain exercise.    Interventions  Encouraged to attend Pulmonary Rehabilitation for the exercise    Continue Psychosocial Services   Follow up required by staff       Education: Education Goals: Education classes will be provided on a weekly basis, covering required topics. Participant will state understanding/return demonstration of topics presented.  Learning Barriers/Preferences: Learning Barriers/Preferences - 08/09/17 1446      Learning Barriers/Preferences   Learning Barriers  Sight wears glasses    Learning Preferences  None       Education Topics:  Initial Evaluation Education: - Verbal, written and demonstration of respiratory meds, oximetry and breathing techniques. Instruction on use of nebulizers and MDIs and importance of monitoring MDI activations.   Pulmonary Rehab from 12/01/2017 in [facility] Cardiac and Pulmonary Rehab  Date  08/09/17  Educator  [facility]  Instruction Review Code  1- Verbalizes Understanding      General Nutrition Guidelines/Fats and Fiber: -Group instruction provided by verbal, written material, models and posters to present the general guidelines for  heart healthy nutrition. Gives an explanation and review of dietary fats and fiber.   Pulmonary Rehab from 12/01/2017 in [facility] Cardiac and Pulmonary Rehab  Date  10/25/17  Educator  CR  Instruction Review Code  1- Verbalizes Understanding      Controlling Sodium/Reading Food Labels: -Group verbal and written material supporting the discussion of sodium use in heart healthy nutrition. Review and explanation with models, verbal and written materials for utilization of the food label.   Pulmonary Rehab from 12/01/2017 in [facility] Cardiac and Pulmonary Rehab  Date  11/08/17  Educator  CR  Instruction Review Code  1- Verbalizes Understanding      Exercise Physiology & General Exercise Guidelines: - Group verbal and written instruction with models to review the exercise physiology of the cardiovascular system and associated critical values. Provides general exercise guidelines with specific guidelines to those with heart or lung disease.    Pulmonary Rehab from 12/01/2017 in [facility] Cardiac and Pulmonary Rehab  Date  11/26/17  Educator  [facility]  Instruction Review Code  5- Refused Teaching [patient has appointment]      Aerobic Exercise & Resistance Training: - Gives group verbal and written instruction on the various components of exercise. Focuses on aerobic and resistive training programs and the benefits of this training and how to safely progress through these programs.   Pulmonary Rehab from 12/01/2017 in [facility] Cardiac and Pulmonary Rehab  Date  10/01/17  Educator  [facility]  Instruction Review Code  1- Verbalizes Understanding      Flexibility, Balance, Mind/Body Relaxation: Provides group verbal/written instruction on the benefits of flexibility and balance training, including mind/body exercise modes such as yoga, pilates and tai chi.  Demonstration and skill practice provided.   Stress and Anxiety: - Provides group verbal and written instruction about the health risks of elevated stress and causes  of high stress.  Discuss the correlation between  heart/lung disease and anxiety and treatment options. Review healthy ways to manage with stress and anxiety.   Pulmonary Rehab from 12/01/2017 in [facility] Cardiac and Pulmonary Rehab  Date  11/03/17  Educator  [facility]  Instruction Review Code  1- Verbalizes Understanding      Depression: - Provides group verbal and written instruction on the correlation between heart/lung disease and depressed mood, treatment options, and the stigmas associated with seeking treatment.   Pulmonary Rehab from 06/17/2016 in [facility] Cardiac and Pulmonary Rehab  Date  05/11/16  Educator  [facility]  Instruction Review Code (retired)  2- meets goals/outcomes      Exercise & Equipment Safety: - Individual verbal instruction and demonstration of equipment use and safety with use of the equipment.   Pulmonary Rehab from 12/01/2017 in [facility] Cardiac and Pulmonary Rehab  Date  08/09/17  Educator  [facility]  Instruction Review Code  1- Verbalizes Understanding      Infection Prevention: - Provides verbal and written material to individual with discussion of infection control including proper hand washing and proper equipment cleaning during exercise session.   Pulmonary Rehab from 12/01/2017 in [facility] Cardiac and Pulmonary Rehab  Date  08/09/17  Educator  [facility]  Instruction Review Code  1- Verbalizes Understanding      Falls Prevention: - Provides verbal and written material to individual with discussion of falls prevention and safety.   Pulmonary Rehab from 12/01/2017 in [facility] Cardiac and Pulmonary Rehab  Date  08/09/17  Educator  [facility]  Instruction Review Code  1- Verbalizes Understanding      Diabetes: - Individual verbal and written instruction to review signs/symptoms of diabetes, desired ranges of glucose level fasting, after meals and with exercise. Advice that pre and post exercise glucose checks will be done for 3 sessions at entry of program.   Chronic Lung Diseases: - Group  verbal and written instruction to review updates, respiratory medications, advancements in procedures and treatments. Discuss use of supplemental oxygen including available portable oxygen systems, continuous and intermittent flow rates, concentrators, personal use and safety guidelines. Review proper use of inhaler and spacers. Provide informative websites for self-education.    Pulmonary Rehab from 12/01/2017 in [facility] Cardiac and Pulmonary Rehab  Date  11/24/17  Educator  [facility]  Instruction Review Code  1- Verbalizes Understanding      Energy Conservation: - Provide group verbal and written instruction for methods to conserve energy, plan and organize activities. Instruct on pacing techniques, use of adaptive equipment and posture/positioning to relieve shortness of breath.   Pulmonary Rehab from 12/01/2017 in [facility] Cardiac and Pulmonary Rehab  Date  10/27/17  Educator  [facility]  Instruction Review Code  1- Verbalizes Understanding      Triggers and Exacerbations: - Group verbal and written instruction to review types of environmental triggers and ways to prevent exacerbations. Discuss weather changes, air quality and the benefits of nasal washing. Review warning signs and symptoms to help prevent infections. Discuss techniques for effective airway clearance, coughing, and vibrations.   Pulmonary Rehab from 12/01/2017 in [facility] Cardiac and Pulmonary Rehab  Date  09/29/17  Educator  [facility]  Instruction Review Code  1- Verbalizes Understanding      AED/CPR: - Group verbal and written instruction with the use of models to demonstrate the basic use of the AED with the basic ABC's of resuscitation.   Anatomy and Physiology of the Lungs: - Group verbal and written instruction with the use of models to provide basic lung anatomy and  physiology related to function, structure and complications of lung disease.   Pulmonary Rehab from 12/01/2017 in [facility] Cardiac and Pulmonary Rehab  Date  11/10/17  Educator  [facility]   Instruction Review Code  1- Verbalizes Understanding      Anatomy & Physiology of the Heart: - Group verbal and written instruction and models provide basic cardiac anatomy and physiology, with the coronary electrical and arterial systems. Review of Valvular disease and Heart Failure   Pulmonary Rehab from 12/01/2017 in [facility] Cardiac and Pulmonary Rehab  Date  10/06/17  Educator  [facility]  Instruction Review Code  1- Verbalizes Understanding      Cardiac Medications: - Group verbal and written instruction to review commonly prescribed medications for heart disease. Reviews the medication, class of the drug, and side effects.   Pulmonary Rehab from 12/01/2017 in [facility] Cardiac and Pulmonary Rehab  Date  10/15/17  Educator  [facility]  Instruction Review Code  1- Verbalizes Understanding      Know Your Numbers and Risk Factors: -Group verbal and written instruction about important numbers in your health.  Discussion of what are risk factors and how they play a role in the disease process.  Review of Cholesterol, Blood Pressure, Diabetes, and BMI and the role they play in your overall health.   Sleep Hygiene: -Provides group verbal and written instruction about how sleep can affect your health.  Define sleep hygiene, discuss sleep cycles and impact of sleep habits. Review good sleep hygiene tips.    Pulmonary Rehab from 12/01/2017 in [facility] Cardiac and Pulmonary Rehab  Date  12/01/17  Educator  [facility]  Instruction Review Code  1- Verbalizes Understanding      Other: -Provides group and verbal instruction on various topics (see comments)    Knowledge Questionnaire Score: Knowledge Questionnaire Score - 08/09/17 1432      Knowledge Questionnaire Score   Pre Score  14/18 reviewed with patient        Core Components/Risk Factors/Patient Goals at Admission: Personal Goals and Risk Factors at Admission - 08/09/17 1437      Core Components/Risk Factors/Patient Goals on Admission    Weight  Management  Yes;Weight Loss    Intervention  Weight Management: Develop a combined nutrition and exercise program designed to reach desired caloric intake, while maintaining appropriate intake of nutrient and fiber, sodium and fats, and appropriate energy expenditure required for the weight goal.;Weight Management: Provide education and appropriate resources to help participant work on and attain dietary goals.;Weight Management/Obesity: Establish reasonable short term and long term weight goals.    Admit Weight  152 lb 6.4 oz (69.1 kg)    Goal Weight: Short Term  147 lb (66.7 kg)    Goal Weight: Long Term  140 lb (63.5 kg)    Expected Outcomes  Long Term: Adherence to nutrition and physical activity/exercise program aimed toward attainment of established weight goal;Short Term: Continue to assess and modify interventions until short term weight is achieved;Weight Maintenance: Understanding of the daily nutrition guidelines, which includes 25-35% calories from fat, 7% or less cal from saturated fats, less than 267m cholesterol, less than 1.5gm of sodium, & 5 or more servings of fruits and vegetables daily;Weight Loss: Understanding of general recommendations for a balanced deficit meal plan, which promotes 1-2 lb weight loss per week and includes a negative energy balance of [phone] kcal/d;Understanding recommendations for meals to include 15-35% energy as protein, 25-35% energy from fat, 35-60% energy from carbohydrates, less than 2034mof dietary cholesterol, 20-35  gm of total fiber daily;Understanding of distribution of calorie intake throughout the day with the consumption of 4-5 meals/snacks    Improve shortness of breath with ADL's  Yes    Intervention  Provide education, individualized exercise plan and daily activity instruction to help decrease symptoms of SOB with activities of daily living.    Expected Outcomes  Short Term: Improve cardiorespiratory fitness to achieve a reduction of symptoms  when performing ADLs;Long Term: Be able to perform more ADLs without symptoms or delay the onset of symptoms       Core Components/Risk Factors/Patient Goals Review:  Goals and Risk Factor Review    Row Name 09/22/17 1152 10/27/17 1240 11/24/17 1052         Core Components/Risk Factors/Patient Goals Review   Personal Goals Review  Weight Management/Obesity;Improve shortness of breath with ADL's;Hypertension;Develop more efficient breathing techniques such as purse lipped breathing and diaphragmatic breathing and practicing self-pacing with activity.  Weight Management/Obesity;Improve shortness of breath with ADL's;Hypertension;Stress  Weight Management/Obesity;Improve shortness of breath with ADL's;Hypertension     Review  Pt states she is watching sodium content and reading food labels.  She is eating more vegetables and drinking more water.  She has decreased sodas to <1 per day.  She feels 02 is better - today is a hard breathing day but in general she can clean at home more.  She missed 2 days of class so staff reviewed home exercise and gave her another copy to encourage exrecise on her own.  Cailan reports she has been exercising at home on her own by walking for 30 minutes at a time.  She states her breathing is better, ADLS are easier.  Her O2 and Bp hae been good when measured in class and at home.  She would like to lose more weight and says she is continuing to eat healthy and watch portion sizes.  She stil has back pain.  Staff remind her of posture on the TM and while walking.  Shyia has been doing well in class and her blood pressure has been getting better. She started the program with blood pressures in the 130's/80's and is not in the 110's-60's. She finds it easier to vacuum at home and do yard work since starting the program.  She would like to lose a couple pounds more before the end of the program.      Expected Outcomes  Short - Pt will attend class consistently and add one day  of exercise at home.  Long - Pt will maintain diet and exrecise habits long term  Short - Rhylynn will concentrate on poper posture during exercise to reduce back pain Long - Pt will be able to exercise with less back pain  Short: continue to attened [name]. Long: graduate [name] and contiue to exercise.        Core Components/Risk Factors/Patient Goals at Discharge (Final Review):  Goals and Risk Factor Review - 11/24/17 1052      Core Components/Risk Factors/Patient Goals Review   Personal Goals Review  Weight Management/Obesity;Improve shortness of breath with ADL's;Hypertension    Review  Trivia has been doing well in class and her blood pressure has been getting better. She started the program with blood pressures in the 130's/80's and is not in the 110's-60's. She finds it easier to vacuum at home and do yard work since starting the program.  She would like to lose a couple pounds more before the end of the program.  Expected Outcomes  Short: continue to attened [name]. Long: graduate [name] and contiue to exercise.       ITP Comments: ITP Comments    Row Name 08/09/17 1400 08/16/17 0822 09/13/17 0827 10/11/17 0823 11/08/17 0819   ITP Comments  Medical Evaluation completed. Chart sent for review and changes to Dr. Emily Filbert Director of [place]. Diagnosis can be found in [facility] encounter 07/28/17.  30 day review completed. ITP sent to Dr. Emily Filbert Director of [place]. Continue with ITP unless changes are made by physician.   30 day review completed. ITP sent to Dr. Emily Filbert Director of [place]. Continue with ITP unless changes are made by physician   30 day review completed. ITP sent to Dr. Emily Filbert Director of [place]. Continue with ITP unless changes are made by physician   30 day review completed. ITP sent to Dr. Emily Filbert Director of [name]. Continue with ITP unless changes are made by physician   [place] Name 12/06/17 0944           ITP Comments   30  day review completed. ITP sent to Dr. Emily Filbert Director of [place]. Continue with ITP unless changes are made by physician          Comments: 30 day review

## 2017-12-06 NOTE — Progress Notes (Signed)
Daily Session Note  Patient Details  Name: Bethany [name] MRN: [id number] Date of Birth: 02-Feb-1936 Referring Provider:     Pulmonary Rehab from 08/09/2017 in [facility] Cardiac and Pulmonary Rehab  Referring Provider  Tracie Harrier MD      Encounter Date: 12/06/2017  Check In: Session Check In - 12/06/17 1015      Check-In   Location  [facility]-Cardiac & Pulmonary Rehab    Staff Present  Justin Mend RCP,RRT,BSRT;Mandi [place], BS, PEC;Laureen Janell Quiet, RRT, Respiratory Therapist    Supervising physician immediately available to respond to emergencies  [name] immediately available ER MD    Physician(s)  Dr. Corky Downs and Jimmye Norman    Medication changes reported      No    Fall or balance concerns reported     No    Tobacco Cessation  No Change    Warm-up and Cool-down  Performed as group-led instruction    Resistance Training Performed  Yes    VAD Patient?  No    PAD/SET Patient?  No      Pain Assessment   Currently in Pain?  No/denies          Social History   Tobacco Use  Smoking Status Former Smoker  . Packs/day: 1.50  . Years: 30.00  . Pack years: 45.00  . Types: Cigarettes  . Last attempt to quit: 10/06/2014  . Years since quitting: 3.1  Smokeless Tobacco Never Used    Goals Met:  Independence with exercise equipment Exercise tolerated well No report of cardiac concerns or symptoms Strength training completed today  Goals Unmet:  Not Applicable  Comments: Pt able to follow exercise prescription today without complaint.  Will continue to monitor for progression.   Dr. Emily Filbert is Medical Director for [place] and [name].

## 2017-12-15 VITALS — Ht 64.1 in | Wt 145.9 lb

## 2017-12-15 DIAGNOSIS — Z7951 Long term (current) use of inhaled steroids: Secondary | ICD-10-CM | POA: Diagnosis not present

## 2017-12-15 DIAGNOSIS — Z87891 Personal history of nicotine dependence: Secondary | ICD-10-CM | POA: Diagnosis not present

## 2017-12-15 DIAGNOSIS — Z79899 Other long term (current) drug therapy: Secondary | ICD-10-CM | POA: Diagnosis not present

## 2017-12-15 DIAGNOSIS — F419 Anxiety disorder, unspecified: Secondary | ICD-10-CM | POA: Diagnosis not present

## 2017-12-15 DIAGNOSIS — Z7982 Long term (current) use of aspirin: Secondary | ICD-10-CM | POA: Diagnosis not present

## 2017-12-15 DIAGNOSIS — J449 Chronic obstructive pulmonary disease, unspecified: Secondary | ICD-10-CM | POA: Diagnosis not present

## 2017-12-15 DIAGNOSIS — E785 Hyperlipidemia, unspecified: Secondary | ICD-10-CM | POA: Diagnosis not present

## 2017-12-15 DIAGNOSIS — M1990 Unspecified osteoarthritis, unspecified site: Secondary | ICD-10-CM | POA: Diagnosis not present

## 2017-12-15 DIAGNOSIS — I10 Essential (primary) hypertension: Secondary | ICD-10-CM | POA: Diagnosis not present

## 2017-12-15 NOTE — Progress Notes (Signed)
Daily Session Note  Patient Details  Name: Bethany [name] MRN: [id number] Date of Birth: 06-24-1935 Referring Provider:     Pulmonary Rehab from 08/09/2017 in [facility] Cardiac and Pulmonary Rehab  Referring Provider  Tracie Harrier MD      Encounter Date: 12/15/2017  Check In: Session Check In - 12/15/17 1004      Check-In   Location  [facility]-Cardiac & Pulmonary Rehab    Staff Present  Justin Mend RCP,RRT,BSRT;Nana Addai, RN Vickki Hearing, BA, ACSM CEP, Exercise Physiologist    Supervising physician immediately available to respond to emergencies  [name] immediately available ER MD    Physician(s)  Dr. Joni Fears and Archie Balboa    Medication changes reported      No    Fall or balance concerns reported     No    Warm-up and Cool-down  Performed as group-led instruction    Resistance Training Performed  Yes    VAD Patient?  No      Pain Assessment   Currently in Pain?  No/denies          Social History   Tobacco Use  Smoking Status Former Smoker  . Packs/day: 1.50  . Years: 30.00  . Pack years: 45.00  . Types: Cigarettes  . Last attempt to quit: 10/06/2014  . Years since quitting: 3.1  Smokeless Tobacco Never Used    Goals Met:  Independence with exercise equipment Exercise tolerated well No report of cardiac concerns or symptoms Strength training completed today  Goals Unmet:  Not Applicable  Comments:  6 Minute Walk    Row Name 08/09/17 1627 12/15/17 1128       6 Minute Walk   Phase  Initial  Discharge    Distance  680 feet  814 feet    Distance % Change  -  19.7 %    Distance Feet Change  -  134 ft    Walk Time  4.77 minutes  5.65 minutes    # of Rest Breaks  1 1:14  1    MPH  1.62  1.64    METS  2.03  2.37    RPE  17  13    Perceived Dyspnea   3  2    VO2 Peak  7.11  8.31    Symptoms  Yes (comment)  No    Comments  back pain 7/10, breathing became painful  -    Resting HR  76 bpm  83 bpm    Resting BP  146/64  110/58    Resting Oxygen  Saturation   97 %  94 %    Exercise Oxygen Saturation  during 6 min walk  86 %  80 %    Max Ex. HR  115 bpm  121 bpm    Max Ex. BP  194/104  196/84    2 Minute Post BP  174/76 rck 154/70  -      Interval HR   1 Minute HR  109  -    2 Minute HR  115  112    3 Minute HR  110  108    4 Minute HR  98  121    5 Minute HR  112  120    6 Minute HR  115  121    2 Minute Post HR  82  95    Interval Heart Rate?  Yes  Yes      Interval Oxygen   Interval Oxygen?  Yes  -    Baseline Oxygen Saturation %  97 %  94 %    1 Minute Oxygen Saturation %  91 %  -    1 Minute Liters of Oxygen  3 L pulsed  2 L    2 Minute Oxygen Saturation %  89 %  82 %    2 Minute Liters of Oxygen  3 L  2 L    3 Minute Oxygen Saturation %  86 % rest break 2:26-3:40  84 %    3 Minute Liters of Oxygen  3 L  2 L    4 Minute Oxygen Saturation %  93 %  84 %    4 Minute Liters of Oxygen  3 L  2 L    5 Minute Oxygen Saturation %  89 %  80 %    5 Minute Liters of Oxygen  3 L  2 L    6 Minute Oxygen Saturation %  86 %  80 %    6 Minute Liters of Oxygen  3 L  2 L    2 Minute Post Oxygen Saturation %  96 %  94 %    2 Minute Post Liters of Oxygen  3 L  2 L     Pt able to follow exercise prescription today without complaint.  Will continue to monitor for progression.   Dr. Emily Filbert is Medical Director for [place] and [name].

## 2017-12-17 DIAGNOSIS — J449 Chronic obstructive pulmonary disease, unspecified: Secondary | ICD-10-CM

## 2017-12-17 DIAGNOSIS — F419 Anxiety disorder, unspecified: Secondary | ICD-10-CM | POA: Diagnosis not present

## 2017-12-17 DIAGNOSIS — Z79899 Other long term (current) drug therapy: Secondary | ICD-10-CM | POA: Diagnosis not present

## 2017-12-17 DIAGNOSIS — Z7951 Long term (current) use of inhaled steroids: Secondary | ICD-10-CM | POA: Diagnosis not present

## 2017-12-17 DIAGNOSIS — I10 Essential (primary) hypertension: Secondary | ICD-10-CM | POA: Diagnosis not present

## 2017-12-17 DIAGNOSIS — Z7982 Long term (current) use of aspirin: Secondary | ICD-10-CM | POA: Diagnosis not present

## 2017-12-17 DIAGNOSIS — E785 Hyperlipidemia, unspecified: Secondary | ICD-10-CM | POA: Diagnosis not present

## 2017-12-17 DIAGNOSIS — Z87891 Personal history of nicotine dependence: Secondary | ICD-10-CM | POA: Diagnosis not present

## 2017-12-17 DIAGNOSIS — M1990 Unspecified osteoarthritis, unspecified site: Secondary | ICD-10-CM | POA: Diagnosis not present

## 2017-12-17 NOTE — Progress Notes (Signed)
Daily Session Note  Patient Details  Name: Bethany [name] MRN: [id number] Date of Birth: December 02, 1935 Referring Provider:     Pulmonary Rehab from 08/09/2017 in [facility] Cardiac and Pulmonary Rehab  Referring Provider  Tracie Harrier MD      Encounter Date: 12/17/2017  Check In: Session Check In - 12/17/17 1014      Check-In   Location  [facility]-Cardiac & Pulmonary Rehab    Staff Present  Justin Mend Lindell Spar, BA, ACSM CEP, Exercise Physiologist;Meredith Sherryll Burger, RN BSN    Supervising physician immediately available to respond to emergencies  [name] immediately available ER MD    Physician(s)  Dr. Joni Fears and Corky Downs    Medication changes reported      No    Fall or balance concerns reported     No    Warm-up and Cool-down  Performed as group-led instruction    Resistance Training Performed  Yes    VAD Patient?  No      Pain Assessment   Currently in Pain?  No/denies          Social History   Tobacco Use  Smoking Status Former Smoker  . Packs/day: 1.50  . Years: 30.00  . Pack years: 45.00  . Types: Cigarettes  . Last attempt to quit: 10/06/2014  . Years since quitting: 3.2  Smokeless Tobacco Never Used    Goals Met:  Independence with exercise equipment Exercise tolerated well No report of cardiac concerns or symptoms Strength training completed today  Goals Unmet:  Not Applicable  Comments: Pt able to follow exercise prescription today without complaint.  Will continue to monitor for progression.   Dr. Emily Filbert is Medical Director for [place] and [name].

## 2017-12-27 ENCOUNTER — Telehealth: Payer: Self-pay

## 2017-12-27 DIAGNOSIS — J449 Chronic obstructive pulmonary disease, unspecified: Secondary | ICD-10-CM

## 2017-12-27 NOTE — Telephone Encounter (Signed)
Talbert ForestShirley states it has been hard to breath lately with the weather and that's why she has not been in class. She will try to come on Wednesday.

## 2017-12-29 DIAGNOSIS — J449 Chronic obstructive pulmonary disease, unspecified: Secondary | ICD-10-CM | POA: Diagnosis not present

## 2017-12-29 DIAGNOSIS — I82409 Acute embolism and thrombosis of unspecified deep veins of unspecified lower extremity: Secondary | ICD-10-CM | POA: Diagnosis not present

## 2017-12-29 DIAGNOSIS — J9620 Acute and chronic respiratory failure, unspecified whether with hypoxia or hypercapnia: Secondary | ICD-10-CM | POA: Diagnosis not present

## 2017-12-29 DIAGNOSIS — I10 Essential (primary) hypertension: Secondary | ICD-10-CM | POA: Diagnosis not present

## 2017-12-30 ENCOUNTER — Ambulatory Visit (INDEPENDENT_AMBULATORY_CARE_PROVIDER_SITE_OTHER): Payer: Medicare HMO | Admitting: Vascular Surgery

## 2017-12-30 ENCOUNTER — Encounter (INDEPENDENT_AMBULATORY_CARE_PROVIDER_SITE_OTHER): Payer: Medicare HMO

## 2018-01-01 DIAGNOSIS — I10 Essential (primary) hypertension: Secondary | ICD-10-CM | POA: Diagnosis not present

## 2018-01-01 DIAGNOSIS — J9610 Chronic respiratory failure, unspecified whether with hypoxia or hypercapnia: Secondary | ICD-10-CM | POA: Diagnosis not present

## 2018-01-01 DIAGNOSIS — J449 Chronic obstructive pulmonary disease, unspecified: Secondary | ICD-10-CM | POA: Diagnosis not present

## 2018-01-01 DIAGNOSIS — R0902 Hypoxemia: Secondary | ICD-10-CM | POA: Diagnosis not present

## 2018-01-03 DIAGNOSIS — J449 Chronic obstructive pulmonary disease, unspecified: Secondary | ICD-10-CM

## 2018-01-03 NOTE — Progress Notes (Signed)
Pulmonary Individual Treatment Plan  Patient Details  Name: Bethany [name] MRN: [id number] Date of Birth: 02/07/1936 Referring Provider:     Pulmonary Rehab from 08/09/2017 in [facility]  Referring Provider  Tracie Harrier MD      Initial Encounter Date:    Pulmonary Rehab from 08/09/2017 in [facility] Re[facility]  Date  08/09/17      Visit Diagnosis: Chronic obstructive pulmonary disease, unspecified COPD type ([place])  Patient's Home Medications on Admission:  Current Outpatient Medications:  .  albuterol (PROVENTIL HFA;VENTOLIN HFA) 108 (90 Base) MCG/ACT inhaler, Inhale 2 puffs into the lungs every 6 (six) hours as needed for wheezing or shortness of breath., Disp: 1 Inhaler, Rfl: 2 .  aspirin (ASPIRIN EC) 81 MG EC tablet, Take 81 mg by mouth daily. Swallow whole., Disp: , Rfl:  .  baclofen (LIORESAL) 10 MG tablet, Take 10 mg by mouth 3 (three) times daily., Disp: , Rfl:  .  CALCIUM CARBONATE PO, Take 1 tablet by mouth daily., Disp: , Rfl:  .  cetirizine (ZYRTEC) 10 MG tablet, Take 10 mg by mouth daily., Disp: , Rfl:  .  Cholecalciferol (VITAMIN D3) 1000 units CAPS, Take 1 capsule by mouth daily., Disp: , Rfl:  .  diazepam (VALIUM) 5 MG tablet, Take 5 mg by mouth at bedtime. Patient takes once at night Reported on 10/16/2015, Disp: , Rfl:  .  docusate sodium (COLACE) 100 MG capsule, Take 100 mg by mouth 2 (two) times daily as needed for mild constipation., Disp: , Rfl:  .  escitalopram (LEXAPRO) 5 MG tablet, Take 10 mg by mouth daily. , Disp: , Rfl:  .  Fluticasone-Salmeterol (ADVAIR) 250-50 MCG/DOSE AEPB, Inhale 1 puff into the lungs 2 (two) times daily., Disp: , Rfl:  .  gabapentin (NEURONTIN) 300 MG capsule, Take 300 mg by mouth 3 (three) times daily., Disp: , Rfl:  .  HYDROcodone-acetaminophen (NORCO/VICODIN) 5-325 MG tablet, Take 1 tablet by mouth 5 (five) times daily as needed for moderate pain. Do not exceed 5 times daily, patient at least 3 day,  Disp: , Rfl:  .  losartan (COZAAR) 50 MG tablet, Take 50 mg by mouth daily., Disp: , Rfl:  .  lovastatin (MEVACOR) 20 MG tablet, Take 20 mg by mouth at bedtime. Reported on 12/12/2015, Disp: , Rfl:  .  metoprolol succinate (TOPROL-XL) 25 MG 24 hr tablet, Take 25 mg by mouth daily., Disp: , Rfl:  .  montelukast (SINGULAIR) 10 MG tablet, Take 10 mg by mouth at bedtime. Reported on 12/12/2015, Disp: , Rfl:  .  tamsulosin (FLOMAX) 0.4 MG CAPS capsule, Take 0.4 mg by mouth daily after supper., Disp: , Rfl:  .  vitamin B-12 (CYANOCOBALAMIN) 1000 MCG tablet, Take 1,000 mcg by mouth daily., Disp: , Rfl:   Past Medical History: Past Medical History:  Diagnosis Date  . Allergy   . Anxiety   . Arthritis   . COPD (chronic obstructive pulmonary disease) ([place])   . Hyperlipidemia   . Hypertension   . Oxygen deficiency     Tobacco Use: Social History   Tobacco Use  Smoking Status Former Smoker  . Packs/day: 1.50  . Years: 30.00  . Pack years: 45.00  . Types: Cigarettes  . Last attempt to quit: 10/06/2014  . Years since quitting: 3.2  Smokeless Tobacco Never Used    Labs: Recent Review Flowsheet Data    There is no flowsheet data to display.  Pulmonary Assessment Scores: Pulmonary Assessment Scores    Row Name 08/09/17 1425 12/15/17 1148       ADL UCSD   ADL Phase  Entry  Exit    SOB Score total  63  -    Rest  0  -    Walk  3  -    Stairs  5  -    Bath  0  -    Dress  1  -    Shop  2  -      CAT Score   CAT Score  26  -      mMRC Score   mMRC Score  3  1       Pulmonary Function Assessment: Pulmonary Function Assessment - 08/09/17 1446      Initial Spirometry Results   FVC%  36 %    FEV1%  30 %    FEV1/FVC Ratio  62.78    Comments  Good patient effort      Post Bronchodilator Spirometry Results   FVC%  43.92 %    FEV1%  32.1 %    FEV1/FVC Ratio  55.17    Comments  Good patient effort      Breath   Bilateral Breath Sounds  Clear;Decreased    Shortness  of Breath  Limiting activity;Panic with Shortness of Breath;Yes       Exercise Target Goals:    Exercise Program Goal: Individual exercise prescription set using results from initial 6 min walk test and THRR while considering  patient's activity barriers and safety.    Exercise Prescription Goal: Initial exercise prescription builds to 30-45 minutes a day of aerobic activity, 2-3 days per week.  Home exercise guidelines will be given to patient during program as part of exercise prescription that the participant will acknowledge.  Activity Barriers & Risk Stratification: Activity Barriers & Cardiac Risk Stratification - 08/09/17 1630      Activity Barriers & Cardiac Risk Stratification   Activity Barriers  Back Problems;Deconditioning;Muscular Weakness;Shortness of Breath 8+ surgeries on her back       6 Minute Walk: 6 Minute Walk    Row Name 08/09/17 1627 12/15/17 1128       6 Minute Walk   Phase  Initial  Discharge    Distance  680 feet  814 feet    Distance % Change  -  19.7 %    Distance Feet Change  -  134 ft    Walk Time  4.77 minutes  5.65 minutes    # of Rest Breaks  1 1:14  1    MPH  1.62  1.64    METS  2.03  2.37    RPE  17  13    Perceived Dyspnea   3  2    VO2 Peak  7.11  8.31    Symptoms  Yes (comment)  No    Comments  back pain 7/10, breathing became painful  -    Resting HR  76 bpm  83 bpm    Resting BP  146/64  110/58    Resting Oxygen Saturation   97 %  94 %    Exercise Oxygen Saturation  during 6 min walk  86 %  80 %    Max Ex. HR  115 bpm  121 bpm    Max Ex. BP  194/104  196/84    2 Minute Post BP  174/76 rck 154/70  -  Interval HR   1 Minute HR  109  -    2 Minute HR  115  112    3 Minute HR  110  108    4 Minute HR  98  121    5 Minute HR  112  120    6 Minute HR  115  121    2 Minute Post HR  82  95    Interval Heart Rate?  Yes  Yes      Interval Oxygen   Interval Oxygen?  Yes  -    Baseline Oxygen Saturation %  97 %  94 %    1  Minute Oxygen Saturation %  91 %  -    1 Minute Liters of Oxygen  3 L pulsed  2 L    2 Minute Oxygen Saturation %  89 %  82 %    2 Minute Liters of Oxygen  3 L  2 L    3 Minute Oxygen Saturation %  86 % rest break 2:26-3:40  84 %    3 Minute Liters of Oxygen  3 L  2 L    4 Minute Oxygen Saturation %  93 %  84 %    4 Minute Liters of Oxygen  3 L  2 L    5 Minute Oxygen Saturation %  89 %  80 %    5 Minute Liters of Oxygen  3 L  2 L    6 Minute Oxygen Saturation %  86 %  80 %    6 Minute Liters of Oxygen  3 L  2 L    2 Minute Post Oxygen Saturation %  96 %  94 %    2 Minute Post Liters of Oxygen  3 L  2 L      Oxygen Initial Assessment: Oxygen Initial Assessment - 08/09/17 1436      Home Oxygen   Home Oxygen Device  Portable Concentrator;Home Concentrator;E-Tanks    Sleep Oxygen Prescription  Continuous    Liters per minute  3    Home Exercise Oxygen Prescription  Continuous    Liters per minute  3    Home at Rest Exercise Oxygen Prescription  Continuous    Liters per minute  3      Initial 6 min Walk   Oxygen Used  Pulsed    Liters per minute  3      Program Oxygen Prescription   Program Oxygen Prescription  Continuous    Liters per minute  3      Intervention   Short Term Goals  To learn and exhibit compliance with exercise, home and travel O2 prescription;To learn and understand importance of maintaining oxygen saturations>88%;To learn and demonstrate proper use of respiratory medications;To learn and demonstrate proper pursed lip breathing techniques or other breathing techniques.;To learn and understand importance of monitoring SPO2 with pulse oximeter and demonstrate accurate use of the pulse oximeter.    Long  Term Goals  Exhibits compliance with exercise, home and travel O2 prescription;Verbalizes importance of monitoring SPO2 with pulse oximeter and return demonstration;Maintenance of O2 saturations>88%;Exhibits proper breathing techniques, such as pursed lip breathing  or other method taught during program session;Compliance with respiratory medication;Demonstrates proper use of MDI's       Oxygen Re-Evaluation: Oxygen Re-Evaluation    Row Name 08/20/17 1008 09/22/17 1147 09/22/17 1148 11/24/17 1056       Program Oxygen Prescription   Program Oxygen Prescription  -  Continuous  -  Continuous    Liters per minute  -  _0 Comments  -  -  -  Zakayla is going to try to exercise on 2 liters of oxygen      Home Oxygen   Home Oxygen Device  -  Portable Concentrator;Home Concentrator;E-Tanks  Portable Concentrator;Home Concentrator;E-Tanks  Portable Concentrator;Home Concentrator;E-Tanks    Sleep Oxygen Prescription  -  CPAP  -  CPAP    Liters per minute  -  _1 Home Exercise Oxygen Prescription  -  Continuous  Continuous  Continuous    Liters per minute  -  _2 Home at Rest Exercise Oxygen Prescription  -  -  Continuous  Continuous    Liters per minute  -  _3 Compliance with Home Oxygen Use  -  -  Yes  Yes      Goals/Expected Outcomes   Short Term Goals  To learn and exhibit compliance with exercise, home and travel O2 prescription;To learn and understand importance of maintaining oxygen saturations>88%;To learn and demonstrate proper use of respiratory medications;To learn and demonstrate proper pursed lip breathing techniques or other breathing techniques.;To learn and understand importance of monitoring SPO2 with pulse oximeter and demonstrate accurate use of the pulse oximeter.  -  To learn and exhibit compliance with exercise, home and travel O2 prescription;To learn and understand importance of monitoring SPO2 with pulse oximeter and demonstrate accurate use of the pulse oximeter.;To learn and understand importance of maintaining oxygen saturations>88%;To learn and demonstrate proper pursed lip breathing techniques or other breathing techniques.;To learn and demonstrate proper use of respiratory medications;Other  To learn and  exhibit compliance with exercise, home and travel O2 prescription;To learn and understand importance of monitoring SPO2 with pulse oximeter and demonstrate accurate use of the pulse oximeter.;To learn and understand importance of maintaining oxygen saturations>88%;To learn and demonstrate proper pursed lip breathing techniques or other breathing techniques.;To learn and demonstrate proper use of respiratory medications;Other    Long  Term Goals  Exhibits compliance with exercise, home and travel O2 prescription;Verbalizes importance of monitoring SPO2 with pulse oximeter and return demonstration;Maintenance of O2 saturations>88%;Exhibits proper breathing techniques, such as pursed lip breathing or other method taught during program session;Compliance with respiratory medication;Demonstrates proper use of MDI's  -  Exhibits compliance with exercise, home and travel O2 prescription;Verbalizes importance of monitoring SPO2 with pulse oximeter and return demonstration;Maintenance of O2 saturations>88%;Exhibits proper breathing techniques, such as pursed lip breathing or other method taught during program session;Compliance with respiratory medication;Demonstrates proper use of MDI's  Exhibits compliance with exercise, home and travel O2 prescription;Verbalizes importance of monitoring SPO2 with pulse oximeter and return demonstration;Maintenance of O2 saturations>88%;Exhibits proper breathing techniques, such as pursed lip breathing or other method taught during program session;Compliance with respiratory medication;Demonstrates proper use of MDI's    Comments  Reviewed PLB technique with pt.  Talked about how it work and it's important to maintaining his exercise saturations.    -  Pt using O2 as prescribed  Jerika is going to try to exercise on 2 liters of oxygen. She checks her oxygen at home and knows the importance of being 88 percent and above. She is doing well with her PLB and uses the technique when she  gets short of breath.    Goals/Expected Outcomes  Short: Become more profiecient at  using PLB.   Long: Become independent at using PLB.  -  Short - continue to manage SOB and maintain O2 >88%  Long - Pt maintains compliance with 02 and complies with meds   Short: use 2 liters while exercising. Long: decrease oxygen to 1 liter while exercising.       Oxygen Discharge (Final Oxygen Re-Evaluation): Oxygen Re-Evaluation - 11/24/17 1056      Program Oxygen Prescription   Program Oxygen Prescription  Continuous    Liters per minute  3    Comments  Lillith is going to try to exercise on 2 liters of oxygen      Home Oxygen   Home Oxygen Device  Portable Concentrator;Home Concentrator;E-Tanks    Sleep Oxygen Prescription  CPAP    Liters per minute  3    Home Exercise Oxygen Prescription  Continuous    Liters per minute  3    Home at Rest Exercise Oxygen Prescription  Continuous    Liters per minute  3    Compliance with Home Oxygen Use  Yes      Goals/Expected Outcomes   Short Term Goals  To learn and exhibit compliance with exercise, home and travel O2 prescription;To learn and understand importance of monitoring SPO2 with pulse oximeter and demonstrate accurate use of the pulse oximeter.;To learn and understand importance of maintaining oxygen saturations>88%;To learn and demonstrate proper pursed lip breathing techniques or other breathing techniques.;To learn and demonstrate proper use of respiratory medications;Other    Long  Term Goals  Exhibits compliance with exercise, home and travel O2 prescription;Verbalizes importance of monitoring SPO2 with pulse oximeter and return demonstration;Maintenance of O2 saturations>88%;Exhibits proper breathing techniques, such as pursed lip breathing or other method taught during program session;Compliance with respiratory medication;Demonstrates proper use of MDI's    Comments  Ceola is going to try to exercise on 2 liters of oxygen. She checks her  oxygen at home and knows the importance of being 88 percent and above. She is doing well with her PLB and uses the technique when she gets short of breath.    Goals/Expected Outcomes  Short: use 2 liters while exercising. Long: decrease oxygen to 1 liter while exercising.       Initial Exercise Prescription: Initial Exercise Prescription - 08/09/17 1600      Date of Initial Exercise RX and Referring Provider   Date  08/09/17    Referring Provider  Tracie Harrier MD      Oxygen   Oxygen  Continuous    Liters  3      Treadmill   MPH  1.3    Grade  0.5    Minutes  15    METs  2.09      NuStep   Level  1    SPM  80    Minutes  15    METs  2      Biostep-RELP   Level  1    SPM  50    Minutes  15    METs  2      Prescription Details   Frequency (times per week)  3    Duration  Progress to 45 minutes of aerobic exercise without signs/symptoms of physical distress      Intensity   THRR 40-80% of Max Heartrate  102-128    Ratings of Perceived Exertion  11-13    Perceived Dyspnea  0-4      Progression   Progression  Continue to  progress workloads to maintain intensity without signs/symptoms of physical distress.      Resistance Training   Training Prescription  Yes    Weight  3 lbs    Reps  10-15       Perform Capillary Blood Glucose checks as needed.  Exercise Prescription Changes: Exercise Prescription Changes    Row Name 08/09/17 1600 09/01/17 1100 09/15/17 1200 09/29/17 1200 10/13/17 1400     Response to Exercise   Blood Pressure (Admit)  146/64  142/74  118/58  122/60  126/58   Blood Pressure (Exercise)  194/104  -  -  -  -   Blood Pressure (Exit)  154/70  124/68  112/61  132/64  116/56   Heart Rate (Admit)  76 bpm  47 bpm  66 bpm  76 bpm  72 bpm   Heart Rate (Exercise)  115 bpm  88 bpm  85 bpm  88 bpm  71 bpm   Heart Rate (Exit)  82 bpm  72 bpm  71 bpm  63 bpm  69 bpm   Oxygen Saturation (Admit)  97 %  92 %  99 %  89 %  94 %   Oxygen Saturation  (Exercise)  86 %  91 %  95 %  90 %  90 %   Oxygen Saturation (Exit)  96 %  96 %  100 %  90 %  98 %   Rating of Perceived Exertion (Exercise)  _0 Perceived Dyspnea (Exercise)  _1 Symptoms  back pain 7/10, painful breathing  -  -  -  -   Comments  walk test results  -  -  -  -   Duration  -  Progress to 45 minutes of aerobic exercise without signs/symptoms of physical distress  Progress to 45 minutes of aerobic exercise without signs/symptoms of physical distress  Continue with 45 min of aerobic exercise without signs/symptoms of physical distress.  Continue with 45 min of aerobic exercise without signs/symptoms of physical distress.   Intensity  -  THRR unchanged  THRR unchanged  Other (comment) reaching RPE not HR goals - meds/back pain  Other (comment)     Progression   Progression  -  Continue to progress workloads to maintain intensity without signs/symptoms of physical distress.  Continue to progress workloads to maintain intensity without signs/symptoms of physical distress.  Continue to progress workloads to maintain intensity without signs/symptoms of physical distress.  Continue to progress workloads to maintain intensity without signs/symptoms of physical distress.   Average METs  -  1.75  _2 Resistance Training   Training Prescription  -  Yes  Yes  Yes  Yes   Weight  -  3 lb  3 lb  3 lb  3 lb   Reps  -  10-15  10-15  10-15  10-15     Interval Training   Interval Training  -  No  No  No  No     Oxygen   Oxygen  -  Continuous  Continuous  Continuous  Continuous   Liters  -  _3 Treadmill   MPH  -  1.3  1.3  1.3  1.3   Grade  -  0.5  0  0  0  Minutes  -  _0 METs  -  2.09  _1 NuStep   Level  -  3  -  -  3   SPM  -  80  -  -  80   Minutes  -  15  -  -  15   METs  -  1.5  -  -  1.9     Biostep-RELP   Level  -  -  _2 SPM  -  -  33  28  30   Minutes  -  -  _3 METs  -  -  _4 Home Exercise Plan   Plans to continue exercise at  -  Home (comment) walk outside  Home (comment) walk outside  Home (comment) walk outside  Home (comment) walk outside   Frequency  -  Add 1 additional day to program exercise sessions.  Add 1 additional day to program exercise sessions.  Add 1 additional day to program exercise sessions.  Add 1 additional day to program exercise sessions.   Initial Home Exercises Provided  -  09/01/17  09/01/17  09/01/17  09/01/17   Row Name 10/26/17 1300 11/08/17 1600 11/24/17 1500 12/08/17 1500 12/22/17 1400     Response to Exercise   Blood Pressure (Admit)  134/74  122/62  116/60  130/64  134/62   Blood Pressure (Exit)  110/70  120/58  126/74  120/72  106/52   Heart Rate (Admit)  79 bpm  72 bpm  70 bpm  81 bpm  91 bpm   Heart Rate (Exercise)  77 bpm  81 bpm  85 bpm  81 bpm  88 bpm   Heart Rate (Exit)  63 bpm  61 bpm  75 bpm  82 bpm  66 bpm   Oxygen Saturation (Admit)  85 %  95 %  97 %  95 %  89 %   Oxygen Saturation (Exercise)  94 %  95 %  92 %  93 %  92 %   Oxygen Saturation (Exit)  98 %  98 %  93 %  94 %  98 %   Rating of Perceived Exertion (Exercise)  _5 Perceived Dyspnea (Exercise)  _6 Duration  Continue with 45 min of aerobic exercise without signs/symptoms of physical distress.  Continue with 45 min of aerobic exercise without signs/symptoms of physical distress.  Continue with 45 min of aerobic exercise without signs/symptoms of physical distress.  Continue with 45 min of aerobic exercise without signs/symptoms of physical distress.  Continue with 45 min of aerobic exercise without signs/symptoms of physical distress.   Intensity  Other (comment) RPE 13-15  Other (comment)  Other (comment) reaching RPE goals  THRR unchanged  THRR unchanged     Progression   Progression  Continue to progress workloads to maintain intensity without signs/symptoms of physical distress.  Continue to progress workloads to maintain  intensity without signs/symptoms of physical distress.  Continue to progress workloads to maintain intensity without signs/symptoms of physical distress.  Continue to progress workloads to maintain intensity without signs/symptoms of physical distress.  Continue to progress workloads to maintain intensity without signs/symptoms of physical distress.  Average METs  2  2  -  1.87  1.9     Resistance Training   Training Prescription  Yes  Yes  -  Yes  Yes   Weight  3 lb  3 lb  -  4 lbs  4 lb   Reps  10-15  10-15  -  10-15  10-15     Interval Training   Interval Training  No  No  -  No  No     Oxygen   Oxygen  Continuous  Continuous  -  Continuous  Continuous   Liters  3  3  -  3  3     Treadmill   MPH  -  1.3  1.3  1.3  1.3   Grade  -  0.5  0  0  0   Minutes  -  _0 METs  -  _1 NuStep   Level  -  4  -  5  3   SPM  -  64  -  77  -   Minutes  -  15  -  15  15   METs  -  1.5  -  2.4  1.7     Biostep-RELP   Level  3  -  _2 Minutes  15  -  _3 METs  2  -  _4 Home Exercise Plan   Plans to continue exercise at  Home (comment) walk outside  Home (comment) walk outside  Home (comment) walk outside  Home (comment) walk outside  Home (comment) walk outside   Frequency  Add 1 additional day to program exercise sessions.  Add 1 additional day to program exercise sessions.  Add 1 additional day to program exercise sessions.  Add 1 additional day to program exercise sessions.  Add 1 additional day to program exercise sessions.   Initial Home Exercises Provided  -  -  -  09/01/17  09/01/17      Exercise Comments: Exercise Comments    Row Name 08/20/17 1007           Exercise Comments  First full day of exercise!  Patient was oriented to gym and equipment including functions, settings, policies, and procedures.  Patient's individual exercise prescription and treatment plan were reviewed.  All starting workloads were established based on the  results of the 6 minute walk test done at initial orientation visit.  The plan for exercise progression was also introduced and progression will be customized based on patient's performance and goals.          Exercise Goals and Review: Exercise Goals    Row Name 08/09/17 1633             Exercise Goals   Increase Physical Activity  Yes       Intervention  Provide advice, education, support and counseling about physical activity/exercise needs.;Develop an individualized exercise prescription for aerobic and resistive training based on initial evaluation findings, risk stratification, comorbidities and participant's personal goals.       Expected Outcomes  Short Term: Attend rehab on a regular basis to increase amount of physical activity.;Long Term: Add in home exercise to make exercise part of routine and to increase amount of physical activity.;Long Term: Exercising regularly  at least 3-5 days a week.       Increase Strength and Stamina  Yes       Intervention  Provide advice, education, support and counseling about physical activity/exercise needs.;Develop an individualized exercise prescription for aerobic and resistive training based on initial evaluation findings, risk stratification, comorbidities and participant's personal goals.       Expected Outcomes  Short Term: Increase workloads from initial exercise prescription for resistance, speed, and METs.;Short Term: Perform resistance training exercises routinely during rehab and add in resistance training at home;Long Term: Improve cardiorespiratory fitness, muscular endurance and strength as measured by increased METs and functional capacity (6MWT)       Able to understand and use rate of perceived exertion (RPE) scale  Yes       Intervention  Provide education and explanation on how to use RPE scale       Expected Outcomes  Short Term: Able to use RPE daily in rehab to express subjective intensity level;Long Term:  Able to use RPE to  guide intensity level when exercising independently       Able to understand and use Dyspnea scale  Yes       Intervention  Provide education and explanation on how to use Dyspnea scale       Expected Outcomes  Short Term: Able to use Dyspnea scale daily in rehab to express subjective sense of shortness of breath during exertion;Long Term: Able to use Dyspnea scale to guide intensity level when exercising independently       Knowledge and understanding of Target Heart Rate Range (THRR)  Yes       Intervention  Provide education and explanation of THRR including how the numbers were predicted and where they are located for reference       Expected Outcomes  Short Term: Able to state/look up THRR;Long Term: Able to use THRR to govern intensity when exercising independently;Short Term: Able to use daily as guideline for intensity in rehab       Able to check pulse independently  Yes       Intervention  Provide education and demonstration on how to check pulse in carotid and radial arteries.;Review the importance of being able to check your own pulse for safety during independent exercise       Expected Outcomes  Short Term: Able to explain why pulse checking is important during independent exercise;Long Term: Able to check pulse independently and accurately       Understanding of Exercise Prescription  Yes       Intervention  Provide education, explanation, and written materials on patient's individual exercise prescription       Expected Outcomes  Short Term: Able to explain program exercise prescription;Long Term: Able to explain home exercise prescription to exercise independently          Exercise Goals Re-Evaluation : Exercise Goals Re-Evaluation    Row Name 08/20/17 1007 09/01/17 1145 09/15/17 1228 09/29/17 1257 10/13/17 1428     Exercise Goal Re-Evaluation   Exercise Goals Review  Understanding of Exercise Prescription;Able to understand and use Dyspnea scale;Knowledge and understanding of  Target Heart Rate Range (THRR);Able to understand and use rate of perceived exertion (RPE) scale  Increase Physical Activity;Able to understand and use rate of perceived exertion (RPE) scale;Increase Strength and Stamina;Able to understand and use Dyspnea scale;Knowledge and understanding of Target Heart Rate Range (THRR)  Increase Physical Activity;Able to understand and use rate of perceived exertion (RPE) scale;Increase Strength and  Stamina;Able to understand and use Dyspnea scale  Increase Physical Activity;Able to understand and use rate of perceived exertion (RPE) scale;Increase Strength and Stamina;Able to understand and use Dyspnea scale  Increase Physical Activity;Able to understand and use rate of perceived exertion (RPE) scale;Knowledge and understanding of Target Heart Rate Range (THRR);Increase Strength and Stamina;Able to understand and use Dyspnea scale   Comments  Reviewed RPE scale, THR and program prescription with pt today.  Pt voiced understanding and was given a copy of goals to take home.   Home exercise reviewed with Pt including HR, RPE, O2.  Kattaleya still has back pain on TM.  Staff encouraged good posture and daily movement to strengthen core and back muscles.  Meher had injections for her back pain last week and states it is less painful.  Staff remind her to use good posture on TM and at home to help build core strength.  The back pain seems to affect her exercise progress as much as breathing.  Rayssa has increased resistance on the biostep. Staff will encourage increasing on NS and TM.  Pt tolerates exercise well.  Back pain keeps her from progressing more.     Expected Outcomes  Short: Use RPE daily to regulate intensity.  Long: Follow program prescription in THR.  Short - Orlena will walk one day at home in addition to program sessions [place] will be able to walk without back pain for 15 min on TM  Short - Pt will work on posture and core strength on machines Long - pt  will be able to exercise with minimal back pain  Short - Nakeia will continue to attend regularly Long - Krystyl will improve overall MET level  Short - Pt will continue to attend classes Long - Pt will build endurance and maintain fitness    Row Name 10/26/17 1306 11/08/17 1635 11/24/17 1421 11/24/17 1511 12/08/17 1458     Exercise Goal Re-Evaluation   Exercise Goals Review  Increase Physical Activity;Able to understand and use rate of perceived exertion (RPE) scale;Increase Strength and Stamina  Increase Physical Activity;Able to understand and use rate of perceived exertion (RPE) scale;Increase Strength and Stamina  Increase Physical Activity;Increase Strength and Stamina  Increase Physical Activity;Increase Strength and Stamina;Able to understand and use Dyspnea scale;Able to understand and use rate of perceived exertion (RPE) scale  Increase Physical Activity;Understanding of Exercise Prescription;Increase Strength and Stamina   Comments  Passion missed a week due to non health related issues.  Regular attendance three times per week would improve progress.  Jacquelynn has increased resistance on NS but not overall MET level.  Her back pain affects her walking on TM.  Jenette states that she is walking more at home around the neighborhood. She is doing more yard work and and getting less short of breath.  Talin does as much as she can tolerate considering back pain limitations  Jesi has been been doing well in rehab.  She continues to struggle with back pain. She is using 4 lbs handweights now! We will continue to monitor her progression.    Expected Outcomes  Short - Noya will attend 3 dyas per week [place] will improve MET levels  Short - staff will review SPM for [facility] to increase METs on NS Long - Pt will increase MET level  Short: add walking to her daily rountine. Long: walk everyday at home.  Short - Kalianna will contineu to attend regularly Long - Chelise will maintain fitness on  her own  Short: Continue to try to attend regularly.  Long: Continue to exercise independently      Discharge Exercise Prescription (Final Exercise Prescription Changes): Exercise Prescription Changes - 12/22/17 1400      Response to Exercise   Blood Pressure (Admit)  134/62    Blood Pressure (Exit)  106/52    Heart Rate (Admit)  91 bpm    Heart Rate (Exercise)  88 bpm    Heart Rate (Exit)  66 bpm    Oxygen Saturation (Admit)  89 %    Oxygen Saturation (Exercise)  92 %    Oxygen Saturation (Exit)  98 %    Rating of Perceived Exertion (Exercise)  13    Perceived Dyspnea (Exercise)  2    Duration  Continue with 45 min of aerobic exercise without signs/symptoms of physical distress.    Intensity  THRR unchanged      Progression   Progression  Continue to progress workloads to maintain intensity without signs/symptoms of physical distress.    Average METs  1.9      Resistance Training   Training Prescription  Yes    Weight  4 lb    Reps  10-15      Interval Training   Interval Training  No      Oxygen   Oxygen  Continuous    Liters  3      Treadmill   MPH  1.3    Grade  0    Minutes  15    METs  2      NuStep   Level  3    Minutes  15    METs  1.7      Biostep-RELP   Level  2    Minutes  15    METs  2      Home Exercise Plan   Plans to continue exercise at  Home (comment) walk outside    Frequency  Add 1 additional day to program exercise sessions.    Initial Home Exercises Provided  09/01/17       Nutrition:  Target Goals: Understanding of nutrition guidelines, daily intake of sodium <1539m, cholesterol <2043m calories 30% from fat and 7% or less from saturated fats, daily to have 5 or more servings of fruits and vegetables.  Biometrics: Pre Biometrics - 08/09/17 1633      Pre Biometrics   Height  5' 4.1" (1.628 m)    Weight  152 lb 6.4 oz (69.1 kg)    Waist Circumference  31 inches    Hip Circumference  39 inches    Waist to Hip Ratio  0.79 %     BMI (Calculated)  26.08      Post Biometrics - 12/15/17 1127       Post  Biometrics   Height  5' 4.1" (1.628 m)    Weight  145 lb 14.4 oz (66.2 kg)    Waist Circumference  31 inches    Hip Circumference  39 inches    Waist to Hip Ratio  0.79 %    BMI (Calculated)  24.97       Nutrition Therapy Plan and Nutrition Goals: Nutrition Therapy & Goals - 10/27/17 1239      Nutrition Therapy   RD appointment deferred  Yes       Nutrition Assessments: Nutrition Assessments - 08/09/17 1428      MEDFICTS Scores   Pre Score  24  Nutrition Goals Re-Evaluation: Nutrition Goals Re-Evaluation    [place] Name 11/24/17 1414             Goals   Current Weight  147 lb (66.7 kg)       Nutrition Goal  Maintain weight and eat healthier meals.       Comment  Tanasha wants to lose weight and eat a healthier diet. She wants to meet witht he dietician.       Expected Outcome  Short: make a dietician appointment. Long: adhere to a diet plan.          Nutrition Goals Discharge (Final Nutrition Goals Re-Evaluation): Nutrition Goals Re-Evaluation - 11/24/17 1414      Goals   Current Weight  147 lb (66.7 kg)    Nutrition Goal  Maintain weight and eat healthier meals.    Comment  Arienne wants to lose weight and eat a healthier diet. She wants to meet witht he dietician.    Expected Outcome  Short: make a dietician appointment. Long: adhere to a diet plan.       Psychosocial: Target Goals: Acknowledge presence or absence of significant depression and/or stress, maximize coping skills, provide positive support system. Participant is able to verbalize types and ability to use techniques and skills needed for reducing stress and depression.   Initial Review & Psychosocial Screening: Initial Psych Review & Screening - 08/09/17 1433      Initial Review   Current issues with  Current Anxiety/Panic      Family Dynamics   Good Support System?  Yes    Comments  She can turn to her brother  for support.      Barriers   Psychosocial barriers to participate in program  The patient should benefit from training in stress management and relaxation.      Screening Interventions   Interventions  Encouraged to exercise;To provide support and resources with identified psychosocial needs;Program counselor consult;Provide feedback about the scores to participant    Expected Outcomes  Short Term goal: Utilizing psychosocial counselor, staff and physician to assist with identification of specific Stressors or current issues interfering with healing process. Setting desired goal for each stressor or current issue identified.;Long Term Goal: Stressors or current issues are controlled or eliminated.;Short Term goal: Identification and review with participant of any Quality of Life or Depression concerns found by scoring the questionnaire.;Long Term goal: The participant improves quality of Life and PHQ9 Scores as seen by post scores and/or verbalization of changes       Quality of Life Scores:  Scores of 19 and below usually indicate a poorer quality of life in these areas.  A difference of  2-3 points is a clinically meaningful difference.  A difference of 2-3 points in the total score of the Quality of Life Index has been associated with significant improvement in overall quality of life, self-image, physical symptoms, and general health in studies assessing change in quality of life.  PHQ-9: Recent Review Flowsheet Data    Depression screen [facility] 2/9 08/09/2017 04/28/2016 01/01/2016 12/12/2015 11/12/2015   Decreased Interest 1 2 0 0 0   Down, Depressed, Hopeless 0 1 0 0 0   PHQ - 2 Score 1 3 0 0 0   Altered sleeping 2 2 - - -   Tired, decreased energy 2 3 - - -   Change in appetite 3 3 - - -   Feeling bad or failure about yourself  3 1 - - -  Trouble concentrating 0 2 - - -   Moving slowly or fidgety/restless 0 0 - - -   Suicidal thoughts 0 0 - - -   PHQ-9 Score 11 14 - - -   Difficult doing  work/chores Very difficult Somewhat difficult - - -     Interpretation of Total Score  Total Score Depression Severity:  1-4 = Minimal depression, 5-9 = Mild depression, 10-14 = Moderate depression, 15-19 = Moderately severe depression, 20-27 = Severe depression   Psychosocial Evaluation and Intervention: Psychosocial Evaluation - 08/23/17 1027      Psychosocial Evaluation & Interventions   Interventions  Encouraged to exercise with the program and follow exercise prescription    Comments  Ms Sando Enid Derry) returned to Pulmonary Rehab recently due to continued difficulty breathing with the COPD.  Counselor met with her for initial psychosocial evaluation.  She has a strong support system with a brother close by; her local church and neighbors.  Phung reports sleeping well currently but continues to have a poor appetite.  She denies depression or anxiety symptoms or a history.  She has a PHQ-9 of "11" indicating moderate depression and discussed this with Enid Derry.  She states her mood is typically positive - it's just her health issues and shortness of breath that impact her quality of life.  Her goals are to breathe better and increase her energy while in this program - in order to return to normal activities.  Staff will follow with Enid Derry throughout the course of this program.      Expected Outcomes  Cedra will exercise consistently to achieve her stated goals.   The educational and psychoeducational components of this program will be helpful for understanding and managing her condition more positively.      Continue Psychosocial Services   Follow up required by staff       Psychosocial Re-Evaluation: Psychosocial Re-Evaluation    [place] Name 11/24/17 1418             Psychosocial Re-Evaluation   Current issues with  Current Anxiety/Panic       Comments  Sometimes when Terrian gets short of breath she gets anxious but not as much since she has been working on PLB. Her brother comes to  see her at [place] once and week and talks to him everyday. She likes coming to exercise at [facility] since it makes her feel better.        Expected Outcomes  Short: attend [name] regularly. Long: Graduate [name] and maintain exercise.       Interventions  Encouraged to attend Pulmonary Rehabilitation for the exercise       Continue Psychosocial Services   Follow up required by staff          Psychosocial Discharge (Final Psychosocial Re-Evaluation): Psychosocial Re-Evaluation - 11/24/17 1418      Psychosocial Re-Evaluation   Current issues with  Current Anxiety/Panic    Comments  Sometimes when Hyla gets short of breath she gets anxious but not as much since she has been working on PLB. Her brother comes to see her at [place] once and week and talks to him everyday. She likes coming to exercise at [facility] since it makes her feel better.     Expected Outcomes  Short: attend [name] regularly. Long: Graduate [name] and maintain exercise.    Interventions  Encouraged to attend Pulmonary Rehabilitation for the exercise    Continue Psychosocial Services   Follow up required by staff  Education: Education Goals: Education classes will be provided on a weekly basis, covering required topics. Participant will state understanding/return demonstration of topics presented.  Learning Barriers/Preferences: Learning Barriers/Preferences - 08/09/17 1446      Learning Barriers/Preferences   Learning Barriers  Sight wears glasses    Learning Preferences  None       Education Topics:  Initial Evaluation Education: - Verbal, written and demonstration of respiratory meds, oximetry and breathing techniques. Instruction on use of nebulizers and MDIs and importance of monitoring MDI activations.   Pulmonary Rehab from 12/15/2017 in [facility] Cardiac and Pulmonary Rehab  Date  08/09/17  Educator  [facility]  Instruction Review Code  1- Verbalizes Understanding      General Nutrition  Guidelines/Fats and Fiber: -Group instruction provided by verbal, written material, models and posters to present the general guidelines for heart healthy nutrition. Gives an explanation and review of dietary fats and fiber.   Pulmonary Rehab from 12/15/2017 in [facility] Cardiac and Pulmonary Rehab  Date  10/25/17  Educator  CR  Instruction Review Code  1- Verbalizes Understanding      Controlling Sodium/Reading Food Labels: -Group verbal and written material supporting the discussion of sodium use in heart healthy nutrition. Review and explanation with models, verbal and written materials for utilization of the food label.   Pulmonary Rehab from 12/15/2017 in [facility] Cardiac and Pulmonary Rehab  Date  11/08/17  Educator  CR  Instruction Review Code  1- Verbalizes Understanding      Exercise Physiology & General Exercise Guidelines: - Group verbal and written instruction with models to review the exercise physiology of the cardiovascular system and associated critical values. Provides general exercise guidelines with specific guidelines to those with heart or lung disease.    Pulmonary Rehab from 12/15/2017 in [facility] Cardiac and Pulmonary Rehab  Date  11/26/17  Educator  [facility]  Instruction Review Code  5- Refused Teaching [patient has appointment]      Aerobic Exercise & Resistance Training: - Gives group verbal and written instruction on the various components of exercise. Focuses on aerobic and resistive training programs and the benefits of this training and how to safely progress through these programs.   Pulmonary Rehab from 12/15/2017 in [facility] Cardiac and Pulmonary Rehab  Date  10/01/17  Educator  [facility]  Instruction Review Code  1- Verbalizes Understanding      Flexibility, Balance, Mind/Body Relaxation: Provides group verbal/written instruction on the benefits of flexibility and balance training, including mind/body exercise modes such as yoga, pilates and tai chi.  Demonstration and skill  practice provided.   Stress and Anxiety: - Provides group verbal and written instruction about the health risks of elevated stress and causes of high stress.  Discuss the correlation between heart/lung disease and anxiety and treatment options. Review healthy ways to manage with stress and anxiety.   Pulmonary Rehab from 12/15/2017 in [facility] Cardiac and Pulmonary Rehab  Date  11/03/17  Educator  [facility]  Instruction Review Code  1- Verbalizes Understanding      Depression: - Provides group verbal and written instruction on the correlation between heart/lung disease and depressed mood, treatment options, and the stigmas associated with seeking treatment.   Pulmonary Rehab from 12/15/2017 in [facility] Cardiac and Pulmonary Rehab  Date  12/15/17  Educator  [facility]  Instruction Review Code  1- Verbalizes Understanding      Exercise & Equipment Safety: - Individual verbal instruction and demonstration of equipment use and safety with use of the equipment.  Pulmonary Rehab from 12/15/2017 in [facility] Cardiac and Pulmonary Rehab  Date  08/09/17  Educator  [facility]  Instruction Review Code  1- Verbalizes Understanding      Infection Prevention: - Provides verbal and written material to individual with discussion of infection control including proper hand washing and proper equipment cleaning during exercise session.   Pulmonary Rehab from 12/15/2017 in [facility] Cardiac and Pulmonary Rehab  Date  08/09/17  Educator  [facility]  Instruction Review Code  1- Verbalizes Understanding      Falls Prevention: - Provides verbal and written material to individual with discussion of falls prevention and safety.   Pulmonary Rehab from 12/15/2017 in [facility] Cardiac and Pulmonary Rehab  Date  08/09/17  Educator  [facility]  Instruction Review Code  1- Verbalizes Understanding      Diabetes: - Individual verbal and written instruction to review signs/symptoms of diabetes, desired ranges of glucose level fasting, after meals and with exercise.  Advice that pre and post exercise glucose checks will be done for 3 sessions at entry of program.   Chronic Lung Diseases: - Group verbal and written instruction to review updates, respiratory medications, advancements in procedures and treatments. Discuss use of supplemental oxygen including available portable oxygen systems, continuous and intermittent flow rates, concentrators, personal use and safety guidelines. Review proper use of inhaler and spacers. Provide informative websites for self-education.    Pulmonary Rehab from 12/15/2017 in [facility] Cardiac and Pulmonary Rehab  Date  11/24/17  Educator  [facility]  Instruction Review Code  1- Verbalizes Understanding      Energy Conservation: - Provide group verbal and written instruction for methods to conserve energy, plan and organize activities. Instruct on pacing techniques, use of adaptive equipment and posture/positioning to relieve shortness of breath.   Pulmonary Rehab from 12/15/2017 in [facility] Cardiac and Pulmonary Rehab  Date  10/27/17  Educator  [facility]  Instruction Review Code  1- Verbalizes Understanding      Triggers and Exacerbations: - Group verbal and written instruction to review types of environmental triggers and ways to prevent exacerbations. Discuss weather changes, air quality and the benefits of nasal washing. Review warning signs and symptoms to help prevent infections. Discuss techniques for effective airway clearance, coughing, and vibrations.   Pulmonary Rehab from 12/15/2017 in [facility] Cardiac and Pulmonary Rehab  Date  09/29/17  Educator  [facility]  Instruction Review Code  1- Verbalizes Understanding      AED/CPR: - Group verbal and written instruction with the use of models to demonstrate the basic use of the AED with the basic ABC's of resuscitation.   Anatomy and Physiology of the Lungs: - Group verbal and written instruction with the use of models to provide basic lung anatomy and physiology related to function, structure and  complications of lung disease.   Pulmonary Rehab from 12/15/2017 in [facility] Cardiac and Pulmonary Rehab  Date  11/10/17  Educator  [facility]  Instruction Review Code  1- Verbalizes Understanding      Anatomy & Physiology of the Heart: - Group verbal and written instruction and models provide basic cardiac anatomy and physiology, with the coronary electrical and arterial systems. Review of Valvular disease and Heart Failure   Pulmonary Rehab from 12/15/2017 in [facility] Cardiac and Pulmonary Rehab  Date  10/06/17  Educator  [facility]  Instruction Review Code  1- Verbalizes Understanding      Cardiac Medications: - Group verbal and written instruction to review commonly prescribed medications for heart disease. Reviews the medication, class  of the drug, and side effects.   Pulmonary Rehab from 12/15/2017 in [facility] Cardiac and Pulmonary Rehab  Date  10/15/17  Educator  [facility]  Instruction Review Code  1- Verbalizes Understanding      Know Your Numbers and Risk Factors: -Group verbal and written instruction about important numbers in your health.  Discussion of what are risk factors and how they play a role in the disease process.  Review of Cholesterol, Blood Pressure, Diabetes, and BMI and the role they play in your overall health.   Sleep Hygiene: -Provides group verbal and written instruction about how sleep can affect your health.  Define sleep hygiene, discuss sleep cycles and impact of sleep habits. Review good sleep hygiene tips.    Pulmonary Rehab from 12/15/2017 in [facility] Cardiac and Pulmonary Rehab  Date  12/01/17  Educator  [facility]  Instruction Review Code  1- Verbalizes Understanding      Other: -Provides group and verbal instruction on various topics (see comments)    Knowledge Questionnaire Score: Knowledge Questionnaire Score - 08/09/17 1432      Knowledge Questionnaire Score   Pre Score  14/18 reviewed with patient        Core Components/Risk Factors/Patient Goals at Admission: Personal  Goals and Risk Factors at Admission - 08/09/17 1437      Core Components/Risk Factors/Patient Goals on Admission    Weight Management  Yes;Weight Loss    Intervention  Weight Management: Develop a combined nutrition and exercise program designed to reach desired caloric intake, while maintaining appropriate intake of nutrient and fiber, sodium and fats, and appropriate energy expenditure required for the weight goal.;Weight Management: Provide education and appropriate resources to help participant work on and attain dietary goals.;Weight Management/Obesity: Establish reasonable short term and long term weight goals.    Admit Weight  152 lb 6.4 oz (69.1 kg)    Goal Weight: Short Term  147 lb (66.7 kg)    Goal Weight: Long Term  140 lb (63.5 kg)    Expected Outcomes  Long Term: Adherence to nutrition and physical activity/exercise program aimed toward attainment of established weight goal;Short Term: Continue to assess and modify interventions until short term weight is achieved;Weight Maintenance: Understanding of the daily nutrition guidelines, which includes 25-35% calories from fat, 7% or less cal from saturated fats, less than 229m cholesterol, less than 1.5gm of sodium, & 5 or more servings of fruits and vegetables daily;Weight Loss: Understanding of general recommendations for a balanced deficit meal plan, which promotes 1-2 lb weight loss per week and includes a negative energy balance of [phone] kcal/d;Understanding recommendations for meals to include 15-35% energy as protein, 25-35% energy from fat, 35-60% energy from carbohydrates, less than 2065mof dietary cholesterol, 20-35 gm of total fiber daily;Understanding of distribution of calorie intake throughout the day with the consumption of 4-5 meals/snacks    Improve shortness of breath with ADL's  Yes    Intervention  Provide education, individualized exercise plan and daily activity instruction to help decrease symptoms of SOB with  activities of daily living.    Expected Outcomes  Short Term: Improve cardiorespiratory fitness to achieve a reduction of symptoms when performing ADLs;Long Term: Be able to perform more ADLs without symptoms or delay the onset of symptoms       Core Components/Risk Factors/Patient Goals Review:  Goals and Risk Factor Review    Row Name 09/22/17 1152 10/27/17 1240 11/24/17 1052         Core Components/Risk Factors/Patient Goals  Review   Personal Goals Review  Weight Management/Obesity;Improve shortness of breath with ADL's;Hypertension;Develop more efficient breathing techniques such as purse lipped breathing and diaphragmatic breathing and practicing self-pacing with activity.  Weight Management/Obesity;Improve shortness of breath with ADL's;Hypertension;Stress  Weight Management/Obesity;Improve shortness of breath with ADL's;Hypertension     Review  Pt states she is watching sodium content and reading food labels.  She is eating more vegetables and drinking more water.  She has decreased sodas to <1 per day.  She feels 02 is better - today is a hard breathing day but in general she can clean at home more.  She missed 2 days of class so staff reviewed home exercise and gave her another copy to encourage exrecise on her own.  Charisse reports she has been exercising at home on her own by walking for 30 minutes at a time.  She states her breathing is better, ADLS are easier.  Her O2 and Bp hae been good when measured in class and at home.  She would like to lose more weight and says she is continuing to eat healthy and watch portion sizes.  She stil has back pain.  Staff remind her of posture on the TM and while walking.  Girtrude has been doing well in class and her blood pressure has been getting better. She started the program with blood pressures in the 130's/80's and is not in the 110's-60's. She finds it easier to vacuum at home and do yard work since starting the program.  She would like to lose a  couple pounds more before the end of the program.      Expected Outcomes  Short - Pt will attend class consistently and add one day of exercise at home.  Long - Pt will maintain diet and exrecise habits long term  Short - Ernestina will concentrate on poper posture during exercise to reduce back pain Long - Pt will be able to exercise with less back pain  Short: continue to attened [name]. Long: graduate [name] and contiue to exercise.        Core Components/Risk Factors/Patient Goals at Discharge (Final Review):  Goals and Risk Factor Review - 11/24/17 1052      Core Components/Risk Factors/Patient Goals Review   Personal Goals Review  Weight Management/Obesity;Improve shortness of breath with ADL's;Hypertension    Review  Rasheen has been doing well in class and her blood pressure has been getting better. She started the program with blood pressures in the 130's/80's and is not in the 110's-60's. She finds it easier to vacuum at home and do yard work since starting the program.  She would like to lose a couple pounds more before the end of the program.     Expected Outcomes  Short: continue to attened [name]. Long: graduate [name] and contiue to exercise.       ITP Comments: ITP Comments    Row Name 08/09/17 1400 08/16/17 0822 09/13/17 0827 10/11/17 0823 11/08/17 0819   ITP Comments  Medical Evaluation completed. Chart sent for review and changes to Dr. Emily Filbert Director of [place]. Diagnosis can be found in [facility] encounter 07/28/17.  30 day review completed. ITP sent to Dr. Emily Filbert Director of [place]. Continue with ITP unless changes are made by physician.   30 day review completed. ITP sent to Dr. Emily Filbert Director of [place]. Continue with ITP unless changes are made by physician   30 day review completed. ITP sent to Dr. Emily Filbert Director of  [name]. Continue with ITP unless changes are made by physician   30 day review completed. ITP sent to Dr. Emily Filbert  Director of [place]. Continue with ITP unless changes are made by physician   Row Name 12/06/17 0944 12/27/17 1604 01/03/18 0841       ITP Comments   30 day review completed. ITP sent to Dr. Emily Filbert Director of [place]. Continue with ITP unless changes are made by physician  [place] states it has been hard to breath lately with the weather and that's why she has not been in class. She will try to come on Wednesday.  30 day review completed. ITP sent to Dr. Emily Filbert Director of [place]. Continue with ITP unless changes are made by physician        Comments: 30 day review

## 2018-01-04 ENCOUNTER — Telehealth: Payer: Self-pay

## 2018-01-04 DIAGNOSIS — J449 Chronic obstructive pulmonary disease, unspecified: Secondary | ICD-10-CM

## 2018-01-04 NOTE — Progress Notes (Signed)
Discharge Progress Report  Patient Details  Name: Bethany [name] MRN: [id number] Date of Birth: 1935/08/05 Referring Provider:     Pulmonary Rehab from 08/09/2017 in [facility]  Referring Provider  Tracie Harrier MD       Number of Visits: 34/36  Reason for Discharge:  Patient reached a stable level of exercise. Patient independent in their exercise. Patient has met program and personal goals. Early Exit:  Personal and back pain  Smoking History:  Social History   Tobacco Use  Smoking Status Former Smoker  . Packs/day: 1.50  . Years: 30.00  . Pack years: 45.00  . Types: Cigarettes  . Last attempt to quit: 10/06/2014  . Years since quitting: 3.2  Smokeless Tobacco Never Used    Diagnosis:  Chronic obstructive pulmonary disease, unspecified COPD type ([place])  ADL [facility]: Pulmonary Assessment Scores    Row Name 08/09/17 1425 12/15/17 1148       ADL [facility]   ADL Phase  Entry  Exit    SOB Score total  63  -    Rest  0  -    Walk  3  -    Stairs  5  -    Bath  0  -    Dress  1  -    Shop  2  -      CAT Score   CAT Score  26  -      mMRC Score   mMRC Score  3  1       Initial Exercise Prescription: Initial Exercise Prescription - 08/09/17 1600      Date of Initial Exercise RX and Referring Provider   Date  08/09/17    Referring Provider  Tracie Harrier MD      Oxygen   Oxygen  Continuous    Liters  3      Treadmill   MPH  1.3    Grade  0.5    Minutes  15    METs  2.09      NuStep   Level  1    SPM  80    Minutes  15    METs  2      Biostep-RELP   Level  1    SPM  50    Minutes  15    METs  2      Prescription Details   Frequency (times per week)  3    Duration  Progress to 45 minutes of aerobic exercise without signs/symptoms of physical distress      Intensity   THRR 40-80% of Max Heartrate  102-128    Ratings of Perceived Exertion  11-13    Perceived Dyspnea  0-4      Progression   Progression  Continue to  progress workloads to maintain intensity without signs/symptoms of physical distress.      Resistance Training   Training Prescription  Yes    Weight  3 lbs    Reps  10-15       Discharge Exercise Prescription (Final Exercise Prescription Changes): Exercise Prescription Changes - 12/22/17 1400      Response to Exercise   Blood Pressure (Admit)  134/62    Blood Pressure (Exit)  106/52    Heart Rate (Admit)  91 bpm    Heart Rate (Exercise)  88 bpm    Heart Rate (Exit)  66 bpm    Oxygen Saturation (Admit)  89 %  Oxygen Saturation (Exercise)  92 %    Oxygen Saturation (Exit)  98 %    Rating of Perceived Exertion (Exercise)  13    Perceived Dyspnea (Exercise)  2    Duration  Continue with 45 min of aerobic exercise without signs/symptoms of physical distress.    Intensity  THRR unchanged      Progression   Progression  Continue to progress workloads to maintain intensity without signs/symptoms of physical distress.    Average METs  1.9      Resistance Training   Training Prescription  Yes    Weight  4 lb    Reps  10-15      Interval Training   Interval Training  No      Oxygen   Oxygen  Continuous    Liters  3      Treadmill   MPH  1.3    Grade  0    Minutes  15    METs  2      NuStep   Level  3    Minutes  15    METs  1.7      Biostep-RELP   Level  2    Minutes  15    METs  2      Home Exercise Plan   Plans to continue exercise at  Home (comment) walk outside    Frequency  Add 1 additional day to program exercise sessions.    Initial Home Exercises Provided  09/01/17       Functional Capacity: 6 Minute Walk    Row Name 08/09/17 1627 12/15/17 1128       6 Minute Walk   Phase  Initial  Discharge    Distance  680 feet  814 feet    Distance % Change  -  19.7 %    Distance Feet Change  -  134 ft    Walk Time  4.77 minutes  5.65 minutes    # of Rest Breaks  1 1:14  1    MPH  1.62  1.64    METS  2.03  2.37    RPE  17  13    Perceived Dyspnea   3   2    VO2 Peak  7.11  8.31    Symptoms  Yes (comment)  No    Comments  back pain 7/10, breathing became painful  -    Resting HR  76 bpm  83 bpm    Resting BP  146/64  110/58    Resting Oxygen Saturation   97 %  94 %    Exercise Oxygen Saturation  during 6 min walk  86 %  80 %    Max Ex. HR  115 bpm  121 bpm    Max Ex. BP  194/104  196/84    2 Minute Post BP  174/76 rck 154/70  -      Interval HR   1 Minute HR  109  -    2 Minute HR  115  112    3 Minute HR  110  108    4 Minute HR  98  121    5 Minute HR  112  120    6 Minute HR  115  121    2 Minute Post HR  82  95    Interval Heart Rate?  Yes  Yes      Interval Oxygen   Interval Oxygen?  Yes  -    Baseline Oxygen Saturation %  97 %  94 %    1 Minute Oxygen Saturation %  91 %  -    1 Minute Liters of Oxygen  3 L pulsed  2 L    2 Minute Oxygen Saturation %  89 %  82 %    2 Minute Liters of Oxygen  3 L  2 L    3 Minute Oxygen Saturation %  86 % rest break 2:26-3:40  84 %    3 Minute Liters of Oxygen  3 L  2 L    4 Minute Oxygen Saturation %  93 %  84 %    4 Minute Liters of Oxygen  3 L  2 L    5 Minute Oxygen Saturation %  89 %  80 %    5 Minute Liters of Oxygen  3 L  2 L    6 Minute Oxygen Saturation %  86 %  80 %    6 Minute Liters of Oxygen  3 L  2 L    2 Minute Post Oxygen Saturation %  96 %  94 %    2 Minute Post Liters of Oxygen  3 L  2 L       Psychological, QOL, Others - Outcomes: PHQ 2/9: Depression screen [facility] 2/9 08/09/2017 04/28/2016 01/01/2016 12/12/2015 11/12/2015  Decreased Interest 1 2 0 0 0  Down, Depressed, Hopeless 0 1 0 0 0  PHQ - 2 Score 1 3 0 0 0  Altered sleeping 2 2 - - -  Tired, decreased energy 2 3 - - -  Change in appetite 3 3 - - -  Feeling bad or failure about yourself  3 1 - - -  Trouble concentrating 0 2 - - -  Moving slowly or fidgety/restless 0 0 - - -  Suicidal thoughts 0 0 - - -  PHQ-9 Score 11 14 - - -  Difficult doing work/chores Very difficult Somewhat difficult - - -    Quality  of Life:   Personal Goals: Goals established at orientation with interventions provided to work toward goal. Personal Goals and Risk Factors at Admission - 08/09/17 1437      Core Components/Risk Factors/Patient Goals on Admission    Weight Management  Yes;Weight Loss    Intervention  Weight Management: Develop a combined nutrition and exercise program designed to reach desired caloric intake, while maintaining appropriate intake of nutrient and fiber, sodium and fats, and appropriate energy expenditure required for the weight goal.;Weight Management: Provide education and appropriate resources to help participant work on and attain dietary goals.;Weight Management/Obesity: Establish reasonable short term and long term weight goals.    Admit Weight  152 lb 6.4 oz (69.1 kg)    Goal Weight: Short Term  147 lb (66.7 kg)    Goal Weight: Long Term  140 lb (63.5 kg)    Expected Outcomes  Long Term: Adherence to nutrition and physical activity/exercise program aimed toward attainment of established weight goal;Short Term: Continue to assess and modify interventions until short term weight is achieved;Weight Maintenance: Understanding of the daily nutrition guidelines, which includes 25-35% calories from fat, 7% or less cal from saturated fats, less than 215m cholesterol, less than 1.5gm of sodium, & 5 or more servings of fruits and vegetables daily;Weight Loss: Understanding of general recommendations for a balanced deficit meal plan, which promotes 1-2 lb weight loss per week and includes a negative energy  balance of [phone] kcal/d;Understanding recommendations for meals to include 15-35% energy as protein, 25-35% energy from fat, 35-60% energy from carbohydrates, less than 26m of dietary cholesterol, 20-35 gm of total fiber daily;Understanding of distribution of calorie intake throughout the day with the consumption of 4-5 meals/snacks    Improve shortness of breath with ADL's  Yes    Intervention   Provide education, individualized exercise plan and daily activity instruction to help decrease symptoms of SOB with activities of daily living.    Expected Outcomes  Short Term: Improve cardiorespiratory fitness to achieve a reduction of symptoms when performing ADLs;Long Term: Be able to perform more ADLs without symptoms or delay the onset of symptoms        Personal Goals Discharge: Goals and Risk Factor Review    Row Name 09/22/17 1152 10/27/17 1240 11/24/17 1052         Core Components/Risk Factors/Patient Goals Review   Personal Goals Review  Weight Management/Obesity;Improve shortness of breath with ADL's;Hypertension;Develop more efficient breathing techniques such as purse lipped breathing and diaphragmatic breathing and practicing self-pacing with activity.  Weight Management/Obesity;Improve shortness of breath with ADL's;Hypertension;Stress  Weight Management/Obesity;Improve shortness of breath with ADL's;Hypertension     Review  Pt states she is watching sodium content and reading food labels.  She is eating more vegetables and drinking more water.  She has decreased sodas to <1 per day.  She feels 02 is better - today is a hard breathing day but in general she can clean at home more.  She missed 2 days of class so staff reviewed home exercise and gave her another copy to encourage exrecise on her own.  S[place] she has been exercising at home on her own by walking for 30 minutes at a time.  She states her breathing is better, ADLS are easier.  Her O2 and Bp hae been good when measured in class and at home.  She would like to lose more weight and says she is continuing to eat healthy and watch portion sizes.  She stil has back pain.  Staff remind her of posture on the TM and while walking.  SRomehas been doing well in class and her blood pressure has been getting better. She started the program with blood pressures in the 130's/80's and is not in the 110's-60's. She finds it  easier to vacuum at home and do yard work since starting the program.  She would like to lose a couple pounds more before the end of the program.      Expected Outcomes  Short - Pt will attend class consistently and add one day of exercise at home.  Long - Pt will maintain diet and exrecise habits long term  Short - Derionna will concentrate on poper posture during exercise to reduce back pain Long - Pt will be able to exercise with less back pain  Short: continue to attened [name]. Long: graduate [name] and contiue to exercise.        Exercise Goals and Review: Exercise Goals    Row Name 08/09/17 1633             Exercise Goals   Increase Physical Activity  Yes       Intervention  Provide advice, education, support and counseling about physical activity/exercise needs.;Develop an individualized exercise prescription for aerobic and resistive training based on initial evaluation findings, risk stratification, comorbidities and participant's personal goals.       Expected Outcomes  Short Term: Attend  rehab on a regular basis to increase amount of physical activity.;Long Term: Add in home exercise to make exercise part of routine and to increase amount of physical activity.;Long Term: Exercising regularly at least 3-5 days a week.       Increase Strength and Stamina  Yes       Intervention  Provide advice, education, support and counseling about physical activity/exercise needs.;Develop an individualized exercise prescription for aerobic and resistive training based on initial evaluation findings, risk stratification, comorbidities and participant's personal goals.       Expected Outcomes  Short Term: Increase workloads from initial exercise prescription for resistance, speed, and METs.;Short Term: Perform resistance training exercises routinely during rehab and add in resistance training at home;Long Term: Improve cardiorespiratory fitness, muscular endurance and strength as measured by  increased METs and functional capacity (6MWT)       Able to understand and use rate of perceived exertion (RPE) scale  Yes       Intervention  Provide education and explanation on how to use RPE scale       Expected Outcomes  Short Term: Able to use RPE daily in rehab to express subjective intensity level;Long Term:  Able to use RPE to guide intensity level when exercising independently       Able to understand and use Dyspnea scale  Yes       Intervention  Provide education and explanation on how to use Dyspnea scale       Expected Outcomes  Short Term: Able to use Dyspnea scale daily in rehab to express subjective sense of shortness of breath during exertion;Long Term: Able to use Dyspnea scale to guide intensity level when exercising independently       Knowledge and understanding of Target Heart Rate Range (THRR)  Yes       Intervention  Provide education and explanation of THRR including how the numbers were predicted and where they are located for reference       Expected Outcomes  Short Term: Able to state/look up THRR;Long Term: Able to use THRR to govern intensity when exercising independently;Short Term: Able to use daily as guideline for intensity in rehab       Able to check pulse independently  Yes       Intervention  Provide education and demonstration on how to check pulse in carotid and radial arteries.;Review the importance of being able to check your own pulse for safety during independent exercise       Expected Outcomes  Short Term: Able to explain why pulse checking is important during independent exercise;Long Term: Able to check pulse independently and accurately       Understanding of Exercise Prescription  Yes       Intervention  Provide education, explanation, and written materials on patient's individual exercise prescription       Expected Outcomes  Short Term: Able to explain program exercise prescription;Long Term: Able to explain home exercise prescription to exercise  independently          Nutrition & Weight - Outcomes: Pre Biometrics - 08/09/17 1633      Pre Biometrics   Height  5' 4.1" (1.628 m)    Weight  152 lb 6.4 oz (69.1 kg)    Waist Circumference  31 inches    Hip Circumference  39 inches    Waist to Hip Ratio  0.79 %    BMI (Calculated)  26.08      Post Biometrics - 12/15/17  [place]   Height  5' 4.1" (1.628 m)    Weight  145 lb 14.4 oz (66.2 kg)    Waist Circumference  31 inches    Hip Circumference  39 inches    Waist to Hip Ratio  0.79 %    BMI (Calculated)  24.97       Nutrition: Nutrition Therapy & Goals - 10/27/17 1239      Nutrition Therapy   RD appointment deferred  Yes       Nutrition Discharge: Nutrition Assessments - 08/09/17 1428      MEDFICTS Scores   Pre Score  24       Education Questionnaire Score: Knowledge Questionnaire Score - 08/09/17 1432      Knowledge Questionnaire Score   Pre Score  14/18 reviewed with patient       Goals reviewed with patient; copy given to patient.

## 2018-01-04 NOTE — Telephone Encounter (Signed)
Called to check on patient, she continues to have back pain and would like to be discharged from the program.

## 2018-01-04 NOTE — Progress Notes (Signed)
Pulmonary Individual Treatment Plan  Patient Details  Name: Bethany [name] MRN: [id number] Date of Birth: 02/07/1936 Referring Provider:     Pulmonary Rehab from 08/09/2017 in [facility]  Referring Provider  Tracie Harrier MD      Initial Encounter Date:    Pulmonary Rehab from 08/09/2017 in [facility] Re[facility]  Date  08/09/17      Visit Diagnosis: Chronic obstructive pulmonary disease, unspecified COPD type ([place])  Patient's Home Medications on Admission:  Current Outpatient Medications:  .  albuterol (PROVENTIL HFA;VENTOLIN HFA) 108 (90 Base) MCG/ACT inhaler, Inhale 2 puffs into the lungs every 6 (six) hours as needed for wheezing or shortness of breath., Disp: 1 Inhaler, Rfl: 2 .  aspirin (ASPIRIN EC) 81 MG EC tablet, Take 81 mg by mouth daily. Swallow whole., Disp: , Rfl:  .  baclofen (LIORESAL) 10 MG tablet, Take 10 mg by mouth 3 (three) times daily., Disp: , Rfl:  .  CALCIUM CARBONATE PO, Take 1 tablet by mouth daily., Disp: , Rfl:  .  cetirizine (ZYRTEC) 10 MG tablet, Take 10 mg by mouth daily., Disp: , Rfl:  .  Cholecalciferol (VITAMIN D3) 1000 units CAPS, Take 1 capsule by mouth daily., Disp: , Rfl:  .  diazepam (VALIUM) 5 MG tablet, Take 5 mg by mouth at bedtime. Patient takes once at night Reported on 10/16/2015, Disp: , Rfl:  .  docusate sodium (COLACE) 100 MG capsule, Take 100 mg by mouth 2 (two) times daily as needed for mild constipation., Disp: , Rfl:  .  escitalopram (LEXAPRO) 5 MG tablet, Take 10 mg by mouth daily. , Disp: , Rfl:  .  Fluticasone-Salmeterol (ADVAIR) 250-50 MCG/DOSE AEPB, Inhale 1 puff into the lungs 2 (two) times daily., Disp: , Rfl:  .  gabapentin (NEURONTIN) 300 MG capsule, Take 300 mg by mouth 3 (three) times daily., Disp: , Rfl:  .  HYDROcodone-acetaminophen (NORCO/VICODIN) 5-325 MG tablet, Take 1 tablet by mouth 5 (five) times daily as needed for moderate pain. Do not exceed 5 times daily, patient at least 3 day,  Disp: , Rfl:  .  losartan (COZAAR) 50 MG tablet, Take 50 mg by mouth daily., Disp: , Rfl:  .  lovastatin (MEVACOR) 20 MG tablet, Take 20 mg by mouth at bedtime. Reported on 12/12/2015, Disp: , Rfl:  .  metoprolol succinate (TOPROL-XL) 25 MG 24 hr tablet, Take 25 mg by mouth daily., Disp: , Rfl:  .  montelukast (SINGULAIR) 10 MG tablet, Take 10 mg by mouth at bedtime. Reported on 12/12/2015, Disp: , Rfl:  .  tamsulosin (FLOMAX) 0.4 MG CAPS capsule, Take 0.4 mg by mouth daily after supper., Disp: , Rfl:  .  vitamin B-12 (CYANOCOBALAMIN) 1000 MCG tablet, Take 1,000 mcg by mouth daily., Disp: , Rfl:   Past Medical History: Past Medical History:  Diagnosis Date  . Allergy   . Anxiety   . Arthritis   . COPD (chronic obstructive pulmonary disease) ([place])   . Hyperlipidemia   . Hypertension   . Oxygen deficiency     Tobacco Use: Social History   Tobacco Use  Smoking Status Former Smoker  . Packs/day: 1.50  . Years: 30.00  . Pack years: 45.00  . Types: Cigarettes  . Last attempt to quit: 10/06/2014  . Years since quitting: 3.2  Smokeless Tobacco Never Used    Labs: Recent Review Flowsheet Data    There is no flowsheet data to display.  Pulmonary Assessment Scores: Pulmonary Assessment Scores    Row Name 08/09/17 1425 12/15/17 1148       ADL UCSD   ADL Phase  Entry  Exit    SOB Score total  63  -    Rest  0  -    Walk  3  -    Stairs  5  -    Bath  0  -    Dress  1  -    Shop  2  -      CAT Score   CAT Score  26  -      mMRC Score   mMRC Score  3  1       Pulmonary Function Assessment: Pulmonary Function Assessment - 08/09/17 1446      Initial Spirometry Results   FVC%  36 %    FEV1%  30 %    FEV1/FVC Ratio  62.78    Comments  Good patient effort      Post Bronchodilator Spirometry Results   FVC%  43.92 %    FEV1%  32.1 %    FEV1/FVC Ratio  55.17    Comments  Good patient effort      Breath   Bilateral Breath Sounds  Clear;Decreased    Shortness  of Breath  Limiting activity;Panic with Shortness of Breath;Yes       Exercise Target Goals:    Exercise Program Goal: Individual exercise prescription set using results from initial 6 min walk test and THRR while considering  patient's activity barriers and safety.    Exercise Prescription Goal: Initial exercise prescription builds to 30-45 minutes a day of aerobic activity, 2-3 days per week.  Home exercise guidelines will be given to patient during program as part of exercise prescription that the participant will acknowledge.  Activity Barriers & Risk Stratification: Activity Barriers & Cardiac Risk Stratification - 08/09/17 1630      Activity Barriers & Cardiac Risk Stratification   Activity Barriers  Back Problems;Deconditioning;Muscular Weakness;Shortness of Breath 8+ surgeries on her back       6 Minute Walk: 6 Minute Walk    Row Name 08/09/17 1627 12/15/17 1128       6 Minute Walk   Phase  Initial  Discharge    Distance  680 feet  814 feet    Distance % Change  -  19.7 %    Distance Feet Change  -  134 ft    Walk Time  4.77 minutes  5.65 minutes    # of Rest Breaks  1 1:14  1    MPH  1.62  1.64    METS  2.03  2.37    RPE  17  13    Perceived Dyspnea   3  2    VO2 Peak  7.11  8.31    Symptoms  Yes (comment)  No    Comments  back pain 7/10, breathing became painful  -    Resting HR  76 bpm  83 bpm    Resting BP  146/64  110/58    Resting Oxygen Saturation   97 %  94 %    Exercise Oxygen Saturation  during 6 min walk  86 %  80 %    Max Ex. HR  115 bpm  121 bpm    Max Ex. BP  194/104  196/84    2 Minute Post BP  174/76 rck 154/70  -  Interval HR   1 Minute HR  109  -    2 Minute HR  115  112    3 Minute HR  110  108    4 Minute HR  98  121    5 Minute HR  112  120    6 Minute HR  115  121    2 Minute Post HR  82  95    Interval Heart Rate?  Yes  Yes      Interval Oxygen   Interval Oxygen?  Yes  -    Baseline Oxygen Saturation %  97 %  94 %    1  Minute Oxygen Saturation %  91 %  -    1 Minute Liters of Oxygen  3 L pulsed  2 L    2 Minute Oxygen Saturation %  89 %  82 %    2 Minute Liters of Oxygen  3 L  2 L    3 Minute Oxygen Saturation %  86 % rest break 2:26-3:40  84 %    3 Minute Liters of Oxygen  3 L  2 L    4 Minute Oxygen Saturation %  93 %  84 %    4 Minute Liters of Oxygen  3 L  2 L    5 Minute Oxygen Saturation %  89 %  80 %    5 Minute Liters of Oxygen  3 L  2 L    6 Minute Oxygen Saturation %  86 %  80 %    6 Minute Liters of Oxygen  3 L  2 L    2 Minute Post Oxygen Saturation %  96 %  94 %    2 Minute Post Liters of Oxygen  3 L  2 L      Oxygen Initial Assessment: Oxygen Initial Assessment - 08/09/17 1436      Home Oxygen   Home Oxygen Device  Portable Concentrator;Home Concentrator;E-Tanks    Sleep Oxygen Prescription  Continuous    Liters per minute  3    Home Exercise Oxygen Prescription  Continuous    Liters per minute  3    Home at Rest Exercise Oxygen Prescription  Continuous    Liters per minute  3      Initial 6 min Walk   Oxygen Used  Pulsed    Liters per minute  3      Program Oxygen Prescription   Program Oxygen Prescription  Continuous    Liters per minute  3      Intervention   Short Term Goals  To learn and exhibit compliance with exercise, home and travel O2 prescription;To learn and understand importance of maintaining oxygen saturations>88%;To learn and demonstrate proper use of respiratory medications;To learn and demonstrate proper pursed lip breathing techniques or other breathing techniques.;To learn and understand importance of monitoring SPO2 with pulse oximeter and demonstrate accurate use of the pulse oximeter.    Long  Term Goals  Exhibits compliance with exercise, home and travel O2 prescription;Verbalizes importance of monitoring SPO2 with pulse oximeter and return demonstration;Maintenance of O2 saturations>88%;Exhibits proper breathing techniques, such as pursed lip breathing  or other method taught during program session;Compliance with respiratory medication;Demonstrates proper use of MDI's       Oxygen Re-Evaluation: Oxygen Re-Evaluation    Row Name 08/20/17 1008 09/22/17 1147 09/22/17 1148 11/24/17 1056       Program Oxygen Prescription   Program Oxygen Prescription  -  Continuous  -  Continuous    Liters per minute  -  _0 Comments  -  -  -  Zakayla is going to try to exercise on 2 liters of oxygen      Home Oxygen   Home Oxygen Device  -  Portable Concentrator;Home Concentrator;E-Tanks  Portable Concentrator;Home Concentrator;E-Tanks  Portable Concentrator;Home Concentrator;E-Tanks    Sleep Oxygen Prescription  -  CPAP  -  CPAP    Liters per minute  -  _1 Home Exercise Oxygen Prescription  -  Continuous  Continuous  Continuous    Liters per minute  -  _2 Home at Rest Exercise Oxygen Prescription  -  -  Continuous  Continuous    Liters per minute  -  _3 Compliance with Home Oxygen Use  -  -  Yes  Yes      Goals/Expected Outcomes   Short Term Goals  To learn and exhibit compliance with exercise, home and travel O2 prescription;To learn and understand importance of maintaining oxygen saturations>88%;To learn and demonstrate proper use of respiratory medications;To learn and demonstrate proper pursed lip breathing techniques or other breathing techniques.;To learn and understand importance of monitoring SPO2 with pulse oximeter and demonstrate accurate use of the pulse oximeter.  -  To learn and exhibit compliance with exercise, home and travel O2 prescription;To learn and understand importance of monitoring SPO2 with pulse oximeter and demonstrate accurate use of the pulse oximeter.;To learn and understand importance of maintaining oxygen saturations>88%;To learn and demonstrate proper pursed lip breathing techniques or other breathing techniques.;To learn and demonstrate proper use of respiratory medications;Other  To learn and  exhibit compliance with exercise, home and travel O2 prescription;To learn and understand importance of monitoring SPO2 with pulse oximeter and demonstrate accurate use of the pulse oximeter.;To learn and understand importance of maintaining oxygen saturations>88%;To learn and demonstrate proper pursed lip breathing techniques or other breathing techniques.;To learn and demonstrate proper use of respiratory medications;Other    Long  Term Goals  Exhibits compliance with exercise, home and travel O2 prescription;Verbalizes importance of monitoring SPO2 with pulse oximeter and return demonstration;Maintenance of O2 saturations>88%;Exhibits proper breathing techniques, such as pursed lip breathing or other method taught during program session;Compliance with respiratory medication;Demonstrates proper use of MDI's  -  Exhibits compliance with exercise, home and travel O2 prescription;Verbalizes importance of monitoring SPO2 with pulse oximeter and return demonstration;Maintenance of O2 saturations>88%;Exhibits proper breathing techniques, such as pursed lip breathing or other method taught during program session;Compliance with respiratory medication;Demonstrates proper use of MDI's  Exhibits compliance with exercise, home and travel O2 prescription;Verbalizes importance of monitoring SPO2 with pulse oximeter and return demonstration;Maintenance of O2 saturations>88%;Exhibits proper breathing techniques, such as pursed lip breathing or other method taught during program session;Compliance with respiratory medication;Demonstrates proper use of MDI's    Comments  Reviewed PLB technique with pt.  Talked about how it work and it's important to maintaining his exercise saturations.    -  Pt using O2 as prescribed  Jerika is going to try to exercise on 2 liters of oxygen. She checks her oxygen at home and knows the importance of being 88 percent and above. She is doing well with her PLB and uses the technique when she  gets short of breath.    Goals/Expected Outcomes  Short: Become more profiecient at  using PLB.   Long: Become independent at using PLB.  -  Short - continue to manage SOB and maintain O2 >88%  Long - Pt maintains compliance with 02 and complies with meds   Short: use 2 liters while exercising. Long: decrease oxygen to 1 liter while exercising.       Oxygen Discharge (Final Oxygen Re-Evaluation): Oxygen Re-Evaluation - 11/24/17 1056      Program Oxygen Prescription   Program Oxygen Prescription  Continuous    Liters per minute  3    Comments  Lillith is going to try to exercise on 2 liters of oxygen      Home Oxygen   Home Oxygen Device  Portable Concentrator;Home Concentrator;E-Tanks    Sleep Oxygen Prescription  CPAP    Liters per minute  3    Home Exercise Oxygen Prescription  Continuous    Liters per minute  3    Home at Rest Exercise Oxygen Prescription  Continuous    Liters per minute  3    Compliance with Home Oxygen Use  Yes      Goals/Expected Outcomes   Short Term Goals  To learn and exhibit compliance with exercise, home and travel O2 prescription;To learn and understand importance of monitoring SPO2 with pulse oximeter and demonstrate accurate use of the pulse oximeter.;To learn and understand importance of maintaining oxygen saturations>88%;To learn and demonstrate proper pursed lip breathing techniques or other breathing techniques.;To learn and demonstrate proper use of respiratory medications;Other    Long  Term Goals  Exhibits compliance with exercise, home and travel O2 prescription;Verbalizes importance of monitoring SPO2 with pulse oximeter and return demonstration;Maintenance of O2 saturations>88%;Exhibits proper breathing techniques, such as pursed lip breathing or other method taught during program session;Compliance with respiratory medication;Demonstrates proper use of MDI's    Comments  Ceola is going to try to exercise on 2 liters of oxygen. She checks her  oxygen at home and knows the importance of being 88 percent and above. She is doing well with her PLB and uses the technique when she gets short of breath.    Goals/Expected Outcomes  Short: use 2 liters while exercising. Long: decrease oxygen to 1 liter while exercising.       Initial Exercise Prescription: Initial Exercise Prescription - 08/09/17 1600      Date of Initial Exercise RX and Referring Provider   Date  08/09/17    Referring Provider  Tracie Harrier MD      Oxygen   Oxygen  Continuous    Liters  3      Treadmill   MPH  1.3    Grade  0.5    Minutes  15    METs  2.09      NuStep   Level  1    SPM  80    Minutes  15    METs  2      Biostep-RELP   Level  1    SPM  50    Minutes  15    METs  2      Prescription Details   Frequency (times per week)  3    Duration  Progress to 45 minutes of aerobic exercise without signs/symptoms of physical distress      Intensity   THRR 40-80% of Max Heartrate  102-128    Ratings of Perceived Exertion  11-13    Perceived Dyspnea  0-4      Progression   Progression  Continue to  progress workloads to maintain intensity without signs/symptoms of physical distress.      Resistance Training   Training Prescription  Yes    Weight  3 lbs    Reps  10-15       Perform Capillary Blood Glucose checks as needed.  Exercise Prescription Changes: Exercise Prescription Changes    Row Name 08/09/17 1600 09/01/17 1100 09/15/17 1200 09/29/17 1200 10/13/17 1400     Response to Exercise   Blood Pressure (Admit)  146/64  142/74  118/58  122/60  126/58   Blood Pressure (Exercise)  194/104  -  -  -  -   Blood Pressure (Exit)  154/70  124/68  112/61  132/64  116/56   Heart Rate (Admit)  76 bpm  47 bpm  66 bpm  76 bpm  72 bpm   Heart Rate (Exercise)  115 bpm  88 bpm  85 bpm  88 bpm  71 bpm   Heart Rate (Exit)  82 bpm  72 bpm  71 bpm  63 bpm  69 bpm   Oxygen Saturation (Admit)  97 %  92 %  99 %  89 %  94 %   Oxygen Saturation  (Exercise)  86 %  91 %  95 %  90 %  90 %   Oxygen Saturation (Exit)  96 %  96 %  100 %  90 %  98 %   Rating of Perceived Exertion (Exercise)  _0 Perceived Dyspnea (Exercise)  _1 Symptoms  back pain 7/10, painful breathing  -  -  -  -   Comments  walk test results  -  -  -  -   Duration  -  Progress to 45 minutes of aerobic exercise without signs/symptoms of physical distress  Progress to 45 minutes of aerobic exercise without signs/symptoms of physical distress  Continue with 45 min of aerobic exercise without signs/symptoms of physical distress.  Continue with 45 min of aerobic exercise without signs/symptoms of physical distress.   Intensity  -  THRR unchanged  THRR unchanged  Other (comment) reaching RPE not HR goals - meds/back pain  Other (comment)     Progression   Progression  -  Continue to progress workloads to maintain intensity without signs/symptoms of physical distress.  Continue to progress workloads to maintain intensity without signs/symptoms of physical distress.  Continue to progress workloads to maintain intensity without signs/symptoms of physical distress.  Continue to progress workloads to maintain intensity without signs/symptoms of physical distress.   Average METs  -  1.75  _2 Resistance Training   Training Prescription  -  Yes  Yes  Yes  Yes   Weight  -  3 lb  3 lb  3 lb  3 lb   Reps  -  10-15  10-15  10-15  10-15     Interval Training   Interval Training  -  No  No  No  No     Oxygen   Oxygen  -  Continuous  Continuous  Continuous  Continuous   Liters  -  _3 Treadmill   MPH  -  1.3  1.3  1.3  1.3   Grade  -  0.5  0  0  0  Minutes  -  _0 METs  -  2.09  _1 NuStep   Level  -  3  -  -  3   SPM  -  80  -  -  80   Minutes  -  15  -  -  15   METs  -  1.5  -  -  1.9     Biostep-RELP   Level  -  -  _2 SPM  -  -  33  28  30   Minutes  -  -  _3 METs  -  -  _4 Home Exercise Plan   Plans to continue exercise at  -  Home (comment) walk outside  Home (comment) walk outside  Home (comment) walk outside  Home (comment) walk outside   Frequency  -  Add 1 additional day to program exercise sessions.  Add 1 additional day to program exercise sessions.  Add 1 additional day to program exercise sessions.  Add 1 additional day to program exercise sessions.   Initial Home Exercises Provided  -  09/01/17  09/01/17  09/01/17  09/01/17   Row Name 10/26/17 1300 11/08/17 1600 11/24/17 1500 12/08/17 1500 12/22/17 1400     Response to Exercise   Blood Pressure (Admit)  134/74  122/62  116/60  130/64  134/62   Blood Pressure (Exit)  110/70  120/58  126/74  120/72  106/52   Heart Rate (Admit)  79 bpm  72 bpm  70 bpm  81 bpm  91 bpm   Heart Rate (Exercise)  77 bpm  81 bpm  85 bpm  81 bpm  88 bpm   Heart Rate (Exit)  63 bpm  61 bpm  75 bpm  82 bpm  66 bpm   Oxygen Saturation (Admit)  85 %  95 %  97 %  95 %  89 %   Oxygen Saturation (Exercise)  94 %  95 %  92 %  93 %  92 %   Oxygen Saturation (Exit)  98 %  98 %  93 %  94 %  98 %   Rating of Perceived Exertion (Exercise)  _5 Perceived Dyspnea (Exercise)  _6 Duration  Continue with 45 min of aerobic exercise without signs/symptoms of physical distress.  Continue with 45 min of aerobic exercise without signs/symptoms of physical distress.  Continue with 45 min of aerobic exercise without signs/symptoms of physical distress.  Continue with 45 min of aerobic exercise without signs/symptoms of physical distress.  Continue with 45 min of aerobic exercise without signs/symptoms of physical distress.   Intensity  Other (comment) RPE 13-15  Other (comment)  Other (comment) reaching RPE goals  THRR unchanged  THRR unchanged     Progression   Progression  Continue to progress workloads to maintain intensity without signs/symptoms of physical distress.  Continue to progress workloads to maintain  intensity without signs/symptoms of physical distress.  Continue to progress workloads to maintain intensity without signs/symptoms of physical distress.  Continue to progress workloads to maintain intensity without signs/symptoms of physical distress.  Continue to progress workloads to maintain intensity without signs/symptoms of physical distress.  Average METs  2  2  -  1.87  1.9     Resistance Training   Training Prescription  Yes  Yes  -  Yes  Yes   Weight  3 lb  3 lb  -  4 lbs  4 lb   Reps  10-15  10-15  -  10-15  10-15     Interval Training   Interval Training  No  No  -  No  No     Oxygen   Oxygen  Continuous  Continuous  -  Continuous  Continuous   Liters  3  3  -  3  3     Treadmill   MPH  -  1.3  1.3  1.3  1.3   Grade  -  0.5  0  0  0   Minutes  -  _0 METs  -  _1 NuStep   Level  -  4  -  5  3   SPM  -  64  -  77  -   Minutes  -  15  -  15  15   METs  -  1.5  -  2.4  1.7     Biostep-RELP   Level  3  -  _2 Minutes  15  -  _3 METs  2  -  _4 Home Exercise Plan   Plans to continue exercise at  Home (comment) walk outside  Home (comment) walk outside  Home (comment) walk outside  Home (comment) walk outside  Home (comment) walk outside   Frequency  Add 1 additional day to program exercise sessions.  Add 1 additional day to program exercise sessions.  Add 1 additional day to program exercise sessions.  Add 1 additional day to program exercise sessions.  Add 1 additional day to program exercise sessions.   Initial Home Exercises Provided  -  -  -  09/01/17  09/01/17      Exercise Comments: Exercise Comments    Row Name 08/20/17 1007           Exercise Comments  First full day of exercise!  Patient was oriented to gym and equipment including functions, settings, policies, and procedures.  Patient's individual exercise prescription and treatment plan were reviewed.  All starting workloads were established based on the  results of the 6 minute walk test done at initial orientation visit.  The plan for exercise progression was also introduced and progression will be customized based on patient's performance and goals.          Exercise Goals and Review: Exercise Goals    Row Name 08/09/17 1633             Exercise Goals   Increase Physical Activity  Yes       Intervention  Provide advice, education, support and counseling about physical activity/exercise needs.;Develop an individualized exercise prescription for aerobic and resistive training based on initial evaluation findings, risk stratification, comorbidities and participant's personal goals.       Expected Outcomes  Short Term: Attend rehab on a regular basis to increase amount of physical activity.;Long Term: Add in home exercise to make exercise part of routine and to increase amount of physical activity.;Long Term: Exercising regularly  at least 3-5 days a week.       Increase Strength and Stamina  Yes       Intervention  Provide advice, education, support and counseling about physical activity/exercise needs.;Develop an individualized exercise prescription for aerobic and resistive training based on initial evaluation findings, risk stratification, comorbidities and participant's personal goals.       Expected Outcomes  Short Term: Increase workloads from initial exercise prescription for resistance, speed, and METs.;Short Term: Perform resistance training exercises routinely during rehab and add in resistance training at home;Long Term: Improve cardiorespiratory fitness, muscular endurance and strength as measured by increased METs and functional capacity (6MWT)       Able to understand and use rate of perceived exertion (RPE) scale  Yes       Intervention  Provide education and explanation on how to use RPE scale       Expected Outcomes  Short Term: Able to use RPE daily in rehab to express subjective intensity level;Long Term:  Able to use RPE to  guide intensity level when exercising independently       Able to understand and use Dyspnea scale  Yes       Intervention  Provide education and explanation on how to use Dyspnea scale       Expected Outcomes  Short Term: Able to use Dyspnea scale daily in rehab to express subjective sense of shortness of breath during exertion;Long Term: Able to use Dyspnea scale to guide intensity level when exercising independently       Knowledge and understanding of Target Heart Rate Range (THRR)  Yes       Intervention  Provide education and explanation of THRR including how the numbers were predicted and where they are located for reference       Expected Outcomes  Short Term: Able to state/look up THRR;Long Term: Able to use THRR to govern intensity when exercising independently;Short Term: Able to use daily as guideline for intensity in rehab       Able to check pulse independently  Yes       Intervention  Provide education and demonstration on how to check pulse in carotid and radial arteries.;Review the importance of being able to check your own pulse for safety during independent exercise       Expected Outcomes  Short Term: Able to explain why pulse checking is important during independent exercise;Long Term: Able to check pulse independently and accurately       Understanding of Exercise Prescription  Yes       Intervention  Provide education, explanation, and written materials on patient's individual exercise prescription       Expected Outcomes  Short Term: Able to explain program exercise prescription;Long Term: Able to explain home exercise prescription to exercise independently          Exercise Goals Re-Evaluation : Exercise Goals Re-Evaluation    Row Name 08/20/17 1007 09/01/17 1145 09/15/17 1228 09/29/17 1257 10/13/17 1428     Exercise Goal Re-Evaluation   Exercise Goals Review  Understanding of Exercise Prescription;Able to understand and use Dyspnea scale;Knowledge and understanding of  Target Heart Rate Range (THRR);Able to understand and use rate of perceived exertion (RPE) scale  Increase Physical Activity;Able to understand and use rate of perceived exertion (RPE) scale;Increase Strength and Stamina;Able to understand and use Dyspnea scale;Knowledge and understanding of Target Heart Rate Range (THRR)  Increase Physical Activity;Able to understand and use rate of perceived exertion (RPE) scale;Increase Strength and  Stamina;Able to understand and use Dyspnea scale  Increase Physical Activity;Able to understand and use rate of perceived exertion (RPE) scale;Increase Strength and Stamina;Able to understand and use Dyspnea scale  Increase Physical Activity;Able to understand and use rate of perceived exertion (RPE) scale;Knowledge and understanding of Target Heart Rate Range (THRR);Increase Strength and Stamina;Able to understand and use Dyspnea scale   Comments  Reviewed RPE scale, THR and program prescription with pt today.  Pt voiced understanding and was given a copy of goals to take home.   Home exercise reviewed with Pt including HR, RPE, O2.  Kattaleya still has back pain on TM.  Staff encouraged good posture and daily movement to strengthen core and back muscles.  Meher had injections for her back pain last week and states it is less painful.  Staff remind her to use good posture on TM and at home to help build core strength.  The back pain seems to affect her exercise progress as much as breathing.  Rayssa has increased resistance on the biostep. Staff will encourage increasing on NS and TM.  Pt tolerates exercise well.  Back pain keeps her from progressing more.     Expected Outcomes  Short: Use RPE daily to regulate intensity.  Long: Follow program prescription in THR.  Short - Orlena will walk one day at home in addition to program sessions [place] will be able to walk without back pain for 15 min on TM  Short - Pt will work on posture and core strength on machines Long - pt  will be able to exercise with minimal back pain  Short - Nakeia will continue to attend regularly Long - Krystyl will improve overall MET level  Short - Pt will continue to attend classes Long - Pt will build endurance and maintain fitness    Row Name 10/26/17 1306 11/08/17 1635 11/24/17 1421 11/24/17 1511 12/08/17 1458     Exercise Goal Re-Evaluation   Exercise Goals Review  Increase Physical Activity;Able to understand and use rate of perceived exertion (RPE) scale;Increase Strength and Stamina  Increase Physical Activity;Able to understand and use rate of perceived exertion (RPE) scale;Increase Strength and Stamina  Increase Physical Activity;Increase Strength and Stamina  Increase Physical Activity;Increase Strength and Stamina;Able to understand and use Dyspnea scale;Able to understand and use rate of perceived exertion (RPE) scale  Increase Physical Activity;Understanding of Exercise Prescription;Increase Strength and Stamina   Comments  Passion missed a week due to non health related issues.  Regular attendance three times per week would improve progress.  Jacquelynn has increased resistance on NS but not overall MET level.  Her back pain affects her walking on TM.  Jenette states that she is walking more at home around the neighborhood. She is doing more yard work and and getting less short of breath.  Talin does as much as she can tolerate considering back pain limitations  Jesi has been been doing well in rehab.  She continues to struggle with back pain. She is using 4 lbs handweights now! We will continue to monitor her progression.    Expected Outcomes  Short - Noya will attend 3 dyas per week [place] will improve MET levels  Short - staff will review SPM for [facility] to increase METs on NS Long - Pt will increase MET level  Short: add walking to her daily rountine. Long: walk everyday at home.  Short - Kalianna will contineu to attend regularly Long - Chelise will maintain fitness on  her own  Short: Continue to try to attend regularly.  Long: Continue to exercise independently      Discharge Exercise Prescription (Final Exercise Prescription Changes): Exercise Prescription Changes - 12/22/17 1400      Response to Exercise   Blood Pressure (Admit)  134/62    Blood Pressure (Exit)  106/52    Heart Rate (Admit)  91 bpm    Heart Rate (Exercise)  88 bpm    Heart Rate (Exit)  66 bpm    Oxygen Saturation (Admit)  89 %    Oxygen Saturation (Exercise)  92 %    Oxygen Saturation (Exit)  98 %    Rating of Perceived Exertion (Exercise)  13    Perceived Dyspnea (Exercise)  2    Duration  Continue with 45 min of aerobic exercise without signs/symptoms of physical distress.    Intensity  THRR unchanged      Progression   Progression  Continue to progress workloads to maintain intensity without signs/symptoms of physical distress.    Average METs  1.9      Resistance Training   Training Prescription  Yes    Weight  4 lb    Reps  10-15      Interval Training   Interval Training  No      Oxygen   Oxygen  Continuous    Liters  3      Treadmill   MPH  1.3    Grade  0    Minutes  15    METs  2      NuStep   Level  3    Minutes  15    METs  1.7      Biostep-RELP   Level  2    Minutes  15    METs  2      Home Exercise Plan   Plans to continue exercise at  Home (comment) walk outside    Frequency  Add 1 additional day to program exercise sessions.    Initial Home Exercises Provided  09/01/17       Nutrition:  Target Goals: Understanding of nutrition guidelines, daily intake of sodium <1539m, cholesterol <2043m calories 30% from fat and 7% or less from saturated fats, daily to have 5 or more servings of fruits and vegetables.  Biometrics: Pre Biometrics - 08/09/17 1633      Pre Biometrics   Height  5' 4.1" (1.628 m)    Weight  152 lb 6.4 oz (69.1 kg)    Waist Circumference  31 inches    Hip Circumference  39 inches    Waist to Hip Ratio  0.79 %     BMI (Calculated)  26.08      Post Biometrics - 12/15/17 1127       Post  Biometrics   Height  5' 4.1" (1.628 m)    Weight  145 lb 14.4 oz (66.2 kg)    Waist Circumference  31 inches    Hip Circumference  39 inches    Waist to Hip Ratio  0.79 %    BMI (Calculated)  24.97       Nutrition Therapy Plan and Nutrition Goals: Nutrition Therapy & Goals - 10/27/17 1239      Nutrition Therapy   RD appointment deferred  Yes       Nutrition Assessments: Nutrition Assessments - 08/09/17 1428      MEDFICTS Scores   Pre Score  24  Nutrition Goals Re-Evaluation: Nutrition Goals Re-Evaluation    [place] Name 11/24/17 1414             Goals   Current Weight  147 lb (66.7 kg)       Nutrition Goal  Maintain weight and eat healthier meals.       Comment  Tanasha wants to lose weight and eat a healthier diet. She wants to meet witht he dietician.       Expected Outcome  Short: make a dietician appointment. Long: adhere to a diet plan.          Nutrition Goals Discharge (Final Nutrition Goals Re-Evaluation): Nutrition Goals Re-Evaluation - 11/24/17 1414      Goals   Current Weight  147 lb (66.7 kg)    Nutrition Goal  Maintain weight and eat healthier meals.    Comment  Arienne wants to lose weight and eat a healthier diet. She wants to meet witht he dietician.    Expected Outcome  Short: make a dietician appointment. Long: adhere to a diet plan.       Psychosocial: Target Goals: Acknowledge presence or absence of significant depression and/or stress, maximize coping skills, provide positive support system. Participant is able to verbalize types and ability to use techniques and skills needed for reducing stress and depression.   Initial Review & Psychosocial Screening: Initial Psych Review & Screening - 08/09/17 1433      Initial Review   Current issues with  Current Anxiety/Panic      Family Dynamics   Good Support System?  Yes    Comments  She can turn to her brother  for support.      Barriers   Psychosocial barriers to participate in program  The patient should benefit from training in stress management and relaxation.      Screening Interventions   Interventions  Encouraged to exercise;To provide support and resources with identified psychosocial needs;Program counselor consult;Provide feedback about the scores to participant    Expected Outcomes  Short Term goal: Utilizing psychosocial counselor, staff and physician to assist with identification of specific Stressors or current issues interfering with healing process. Setting desired goal for each stressor or current issue identified.;Long Term Goal: Stressors or current issues are controlled or eliminated.;Short Term goal: Identification and review with participant of any Quality of Life or Depression concerns found by scoring the questionnaire.;Long Term goal: The participant improves quality of Life and PHQ9 Scores as seen by post scores and/or verbalization of changes       Quality of Life Scores:  Scores of 19 and below usually indicate a poorer quality of life in these areas.  A difference of  2-3 points is a clinically meaningful difference.  A difference of 2-3 points in the total score of the Quality of Life Index has been associated with significant improvement in overall quality of life, self-image, physical symptoms, and general health in studies assessing change in quality of life.  PHQ-9: Recent Review Flowsheet Data    Depression screen [facility] 2/9 08/09/2017 04/28/2016 01/01/2016 12/12/2015 11/12/2015   Decreased Interest 1 2 0 0 0   Down, Depressed, Hopeless 0 1 0 0 0   PHQ - 2 Score 1 3 0 0 0   Altered sleeping 2 2 - - -   Tired, decreased energy 2 3 - - -   Change in appetite 3 3 - - -   Feeling bad or failure about yourself  3 1 - - -  Trouble concentrating 0 2 - - -   Moving slowly or fidgety/restless 0 0 - - -   Suicidal thoughts 0 0 - - -   PHQ-9 Score 11 14 - - -   Difficult doing  work/chores Very difficult Somewhat difficult - - -     Interpretation of Total Score  Total Score Depression Severity:  1-4 = Minimal depression, 5-9 = Mild depression, 10-14 = Moderate depression, 15-19 = Moderately severe depression, 20-27 = Severe depression   Psychosocial Evaluation and Intervention: Psychosocial Evaluation - 08/23/17 1027      Psychosocial Evaluation & Interventions   Interventions  Encouraged to exercise with the program and follow exercise prescription    Comments  Ms Sando Enid Derry) returned to Pulmonary Rehab recently due to continued difficulty breathing with the COPD.  Counselor met with her for initial psychosocial evaluation.  She has a strong support system with a brother close by; her local church and neighbors.  Phung reports sleeping well currently but continues to have a poor appetite.  She denies depression or anxiety symptoms or a history.  She has a PHQ-9 of "11" indicating moderate depression and discussed this with Enid Derry.  She states her mood is typically positive - it's just her health issues and shortness of breath that impact her quality of life.  Her goals are to breathe better and increase her energy while in this program - in order to return to normal activities.  Staff will follow with Enid Derry throughout the course of this program.      Expected Outcomes  Cedra will exercise consistently to achieve her stated goals.   The educational and psychoeducational components of this program will be helpful for understanding and managing her condition more positively.      Continue Psychosocial Services   Follow up required by staff       Psychosocial Re-Evaluation: Psychosocial Re-Evaluation    [place] Name 11/24/17 1418             Psychosocial Re-Evaluation   Current issues with  Current Anxiety/Panic       Comments  Sometimes when Terrian gets short of breath she gets anxious but not as much since she has been working on PLB. Her brother comes to  see her at [place] once and week and talks to him everyday. She likes coming to exercise at [facility] since it makes her feel better.        Expected Outcomes  Short: attend [name] regularly. Long: Graduate [name] and maintain exercise.       Interventions  Encouraged to attend Pulmonary Rehabilitation for the exercise       Continue Psychosocial Services   Follow up required by staff          Psychosocial Discharge (Final Psychosocial Re-Evaluation): Psychosocial Re-Evaluation - 11/24/17 1418      Psychosocial Re-Evaluation   Current issues with  Current Anxiety/Panic    Comments  Sometimes when Hyla gets short of breath she gets anxious but not as much since she has been working on PLB. Her brother comes to see her at [place] once and week and talks to him everyday. She likes coming to exercise at [facility] since it makes her feel better.     Expected Outcomes  Short: attend [name] regularly. Long: Graduate [name] and maintain exercise.    Interventions  Encouraged to attend Pulmonary Rehabilitation for the exercise    Continue Psychosocial Services   Follow up required by staff  Education: Education Goals: Education classes will be provided on a weekly basis, covering required topics. Participant will state understanding/return demonstration of topics presented.  Learning Barriers/Preferences: Learning Barriers/Preferences - 08/09/17 1446      Learning Barriers/Preferences   Learning Barriers  Sight wears glasses    Learning Preferences  None       Education Topics:  Initial Evaluation Education: - Verbal, written and demonstration of respiratory meds, oximetry and breathing techniques. Instruction on use of nebulizers and MDIs and importance of monitoring MDI activations.   Pulmonary Rehab from 12/15/2017 in [facility] Cardiac and Pulmonary Rehab  Date  08/09/17  Educator  [facility]  Instruction Review Code  1- Verbalizes Understanding      General Nutrition  Guidelines/Fats and Fiber: -Group instruction provided by verbal, written material, models and posters to present the general guidelines for heart healthy nutrition. Gives an explanation and review of dietary fats and fiber.   Pulmonary Rehab from 12/15/2017 in [facility] Cardiac and Pulmonary Rehab  Date  10/25/17  Educator  CR  Instruction Review Code  1- Verbalizes Understanding      Controlling Sodium/Reading Food Labels: -Group verbal and written material supporting the discussion of sodium use in heart healthy nutrition. Review and explanation with models, verbal and written materials for utilization of the food label.   Pulmonary Rehab from 12/15/2017 in [facility] Cardiac and Pulmonary Rehab  Date  11/08/17  Educator  CR  Instruction Review Code  1- Verbalizes Understanding      Exercise Physiology & General Exercise Guidelines: - Group verbal and written instruction with models to review the exercise physiology of the cardiovascular system and associated critical values. Provides general exercise guidelines with specific guidelines to those with heart or lung disease.    Pulmonary Rehab from 12/15/2017 in [facility] Cardiac and Pulmonary Rehab  Date  11/26/17  Educator  [facility]  Instruction Review Code  5- Refused Teaching [patient has appointment]      Aerobic Exercise & Resistance Training: - Gives group verbal and written instruction on the various components of exercise. Focuses on aerobic and resistive training programs and the benefits of this training and how to safely progress through these programs.   Pulmonary Rehab from 12/15/2017 in [facility] Cardiac and Pulmonary Rehab  Date  10/01/17  Educator  [facility]  Instruction Review Code  1- Verbalizes Understanding      Flexibility, Balance, Mind/Body Relaxation: Provides group verbal/written instruction on the benefits of flexibility and balance training, including mind/body exercise modes such as yoga, pilates and tai chi.  Demonstration and skill  practice provided.   Stress and Anxiety: - Provides group verbal and written instruction about the health risks of elevated stress and causes of high stress.  Discuss the correlation between heart/lung disease and anxiety and treatment options. Review healthy ways to manage with stress and anxiety.   Pulmonary Rehab from 12/15/2017 in [facility] Cardiac and Pulmonary Rehab  Date  11/03/17  Educator  [facility]  Instruction Review Code  1- Verbalizes Understanding      Depression: - Provides group verbal and written instruction on the correlation between heart/lung disease and depressed mood, treatment options, and the stigmas associated with seeking treatment.   Pulmonary Rehab from 12/15/2017 in [facility] Cardiac and Pulmonary Rehab  Date  12/15/17  Educator  [facility]  Instruction Review Code  1- Verbalizes Understanding      Exercise & Equipment Safety: - Individual verbal instruction and demonstration of equipment use and safety with use of the equipment.  Pulmonary Rehab from 12/15/2017 in [facility] Cardiac and Pulmonary Rehab  Date  08/09/17  Educator  [facility]  Instruction Review Code  1- Verbalizes Understanding      Infection Prevention: - Provides verbal and written material to individual with discussion of infection control including proper hand washing and proper equipment cleaning during exercise session.   Pulmonary Rehab from 12/15/2017 in [facility] Cardiac and Pulmonary Rehab  Date  08/09/17  Educator  [facility]  Instruction Review Code  1- Verbalizes Understanding      Falls Prevention: - Provides verbal and written material to individual with discussion of falls prevention and safety.   Pulmonary Rehab from 12/15/2017 in [facility] Cardiac and Pulmonary Rehab  Date  08/09/17  Educator  [facility]  Instruction Review Code  1- Verbalizes Understanding      Diabetes: - Individual verbal and written instruction to review signs/symptoms of diabetes, desired ranges of glucose level fasting, after meals and with exercise.  Advice that pre and post exercise glucose checks will be done for 3 sessions at entry of program.   Chronic Lung Diseases: - Group verbal and written instruction to review updates, respiratory medications, advancements in procedures and treatments. Discuss use of supplemental oxygen including available portable oxygen systems, continuous and intermittent flow rates, concentrators, personal use and safety guidelines. Review proper use of inhaler and spacers. Provide informative websites for self-education.    Pulmonary Rehab from 12/15/2017 in [facility] Cardiac and Pulmonary Rehab  Date  11/24/17  Educator  [facility]  Instruction Review Code  1- Verbalizes Understanding      Energy Conservation: - Provide group verbal and written instruction for methods to conserve energy, plan and organize activities. Instruct on pacing techniques, use of adaptive equipment and posture/positioning to relieve shortness of breath.   Pulmonary Rehab from 12/15/2017 in [facility] Cardiac and Pulmonary Rehab  Date  10/27/17  Educator  [facility]  Instruction Review Code  1- Verbalizes Understanding      Triggers and Exacerbations: - Group verbal and written instruction to review types of environmental triggers and ways to prevent exacerbations. Discuss weather changes, air quality and the benefits of nasal washing. Review warning signs and symptoms to help prevent infections. Discuss techniques for effective airway clearance, coughing, and vibrations.   Pulmonary Rehab from 12/15/2017 in [facility] Cardiac and Pulmonary Rehab  Date  09/29/17  Educator  [facility]  Instruction Review Code  1- Verbalizes Understanding      AED/CPR: - Group verbal and written instruction with the use of models to demonstrate the basic use of the AED with the basic ABC's of resuscitation.   Anatomy and Physiology of the Lungs: - Group verbal and written instruction with the use of models to provide basic lung anatomy and physiology related to function, structure and  complications of lung disease.   Pulmonary Rehab from 12/15/2017 in [facility] Cardiac and Pulmonary Rehab  Date  11/10/17  Educator  [facility]  Instruction Review Code  1- Verbalizes Understanding      Anatomy & Physiology of the Heart: - Group verbal and written instruction and models provide basic cardiac anatomy and physiology, with the coronary electrical and arterial systems. Review of Valvular disease and Heart Failure   Pulmonary Rehab from 12/15/2017 in [facility] Cardiac and Pulmonary Rehab  Date  10/06/17  Educator  [facility]  Instruction Review Code  1- Verbalizes Understanding      Cardiac Medications: - Group verbal and written instruction to review commonly prescribed medications for heart disease. Reviews the medication, class  of the drug, and side effects.   Pulmonary Rehab from 12/15/2017 in [facility] Cardiac and Pulmonary Rehab  Date  10/15/17  Educator  [facility]  Instruction Review Code  1- Verbalizes Understanding      Know Your Numbers and Risk Factors: -Group verbal and written instruction about important numbers in your health.  Discussion of what are risk factors and how they play a role in the disease process.  Review of Cholesterol, Blood Pressure, Diabetes, and BMI and the role they play in your overall health.   Sleep Hygiene: -Provides group verbal and written instruction about how sleep can affect your health.  Define sleep hygiene, discuss sleep cycles and impact of sleep habits. Review good sleep hygiene tips.    Pulmonary Rehab from 12/15/2017 in [facility] Cardiac and Pulmonary Rehab  Date  12/01/17  Educator  [facility]  Instruction Review Code  1- Verbalizes Understanding      Other: -Provides group and verbal instruction on various topics (see comments)    Knowledge Questionnaire Score: Knowledge Questionnaire Score - 08/09/17 1432      Knowledge Questionnaire Score   Pre Score  14/18 reviewed with patient        Core Components/Risk Factors/Patient Goals at Admission: Personal  Goals and Risk Factors at Admission - 08/09/17 1437      Core Components/Risk Factors/Patient Goals on Admission    Weight Management  Yes;Weight Loss    Intervention  Weight Management: Develop a combined nutrition and exercise program designed to reach desired caloric intake, while maintaining appropriate intake of nutrient and fiber, sodium and fats, and appropriate energy expenditure required for the weight goal.;Weight Management: Provide education and appropriate resources to help participant work on and attain dietary goals.;Weight Management/Obesity: Establish reasonable short term and long term weight goals.    Admit Weight  152 lb 6.4 oz (69.1 kg)    Goal Weight: Short Term  147 lb (66.7 kg)    Goal Weight: Long Term  140 lb (63.5 kg)    Expected Outcomes  Long Term: Adherence to nutrition and physical activity/exercise program aimed toward attainment of established weight goal;Short Term: Continue to assess and modify interventions until short term weight is achieved;Weight Maintenance: Understanding of the daily nutrition guidelines, which includes 25-35% calories from fat, 7% or less cal from saturated fats, less than 229m cholesterol, less than 1.5gm of sodium, & 5 or more servings of fruits and vegetables daily;Weight Loss: Understanding of general recommendations for a balanced deficit meal plan, which promotes 1-2 lb weight loss per week and includes a negative energy balance of [phone] kcal/d;Understanding recommendations for meals to include 15-35% energy as protein, 25-35% energy from fat, 35-60% energy from carbohydrates, less than 2065mof dietary cholesterol, 20-35 gm of total fiber daily;Understanding of distribution of calorie intake throughout the day with the consumption of 4-5 meals/snacks    Improve shortness of breath with ADL's  Yes    Intervention  Provide education, individualized exercise plan and daily activity instruction to help decrease symptoms of SOB with  activities of daily living.    Expected Outcomes  Short Term: Improve cardiorespiratory fitness to achieve a reduction of symptoms when performing ADLs;Long Term: Be able to perform more ADLs without symptoms or delay the onset of symptoms       Core Components/Risk Factors/Patient Goals Review:  Goals and Risk Factor Review    Row Name 09/22/17 1152 10/27/17 1240 11/24/17 1052         Core Components/Risk Factors/Patient Goals  Review   Personal Goals Review  Weight Management/Obesity;Improve shortness of breath with ADL's;Hypertension;Develop more efficient breathing techniques such as purse lipped breathing and diaphragmatic breathing and practicing self-pacing with activity.  Weight Management/Obesity;Improve shortness of breath with ADL's;Hypertension;Stress  Weight Management/Obesity;Improve shortness of breath with ADL's;Hypertension     Review  Pt states she is watching sodium content and reading food labels.  She is eating more vegetables and drinking more water.  She has decreased sodas to <1 per day.  She feels 02 is better - today is a hard breathing day but in general she can clean at home more.  She missed 2 days of class so staff reviewed home exercise and gave her another copy to encourage exrecise on her own.  Charisse reports she has been exercising at home on her own by walking for 30 minutes at a time.  She states her breathing is better, ADLS are easier.  Her O2 and Bp hae been good when measured in class and at home.  She would like to lose more weight and says she is continuing to eat healthy and watch portion sizes.  She stil has back pain.  Staff remind her of posture on the TM and while walking.  Girtrude has been doing well in class and her blood pressure has been getting better. She started the program with blood pressures in the 130's/80's and is not in the 110's-60's. She finds it easier to vacuum at home and do yard work since starting the program.  She would like to lose a  couple pounds more before the end of the program.      Expected Outcomes  Short - Pt will attend class consistently and add one day of exercise at home.  Long - Pt will maintain diet and exrecise habits long term  Short - Merlina will concentrate on poper posture during exercise to reduce back pain Long - Pt will be able to exercise with less back pain  Short: continue to attened [name]. Long: graduate [name] and contiue to exercise.        Core Components/Risk Factors/Patient Goals at Discharge (Final Review):  Goals and Risk Factor Review - 11/24/17 1052      Core Components/Risk Factors/Patient Goals Review   Personal Goals Review  Weight Management/Obesity;Improve shortness of breath with ADL's;Hypertension    Review  Rasheen has been doing well in class and her blood pressure has been getting better. She started the program with blood pressures in the 130's/80's and is not in the 110's-60's. She finds it easier to vacuum at home and do yard work since starting the program.  She would like to lose a couple pounds more before the end of the program.     Expected Outcomes  Short: continue to attened [name]. Long: graduate [name] and contiue to exercise.       ITP Comments: ITP Comments    Row Name 08/09/17 1400 08/16/17 0822 09/13/17 0827 10/11/17 0823 11/08/17 0819   ITP Comments  Medical Evaluation completed. Chart sent for review and changes to Dr. Emily Filbert Director of [place]. Diagnosis can be found in [facility] encounter 07/28/17.  30 day review completed. ITP sent to Dr. Emily Filbert Director of [place]. Continue with ITP unless changes are made by physician.   30 day review completed. ITP sent to Dr. Emily Filbert Director of [place]. Continue with ITP unless changes are made by physician   30 day review completed. ITP sent to Dr. Emily Filbert Director of  [name]. Continue with ITP unless changes are made by physician   30 day review completed. ITP sent to Dr. Emily Filbert  Director of [place]. Continue with ITP unless changes are made by physician   Row Name 12/06/17 0944 12/27/17 1604 01/03/18 0841 01/04/18 1507 01/04/18 1508   ITP Comments   30 day review completed. ITP sent to Dr. Emily Filbert Director of [place]. Continue with ITP unless changes are made by physician  Tya states it has been hard to breath lately with the weather and that's why she has not been in class. She will try to come on Wednesday.  30 day review completed. ITP sent to Dr. Emily Filbert Director of [place]. Continue with ITP unless changes are made by physician  Called to check on patient, she continues to have back pain and would like to be discharged from the program.  Discharge ITP sent and signed by Dr. Sabra Heck.  Discharge Summary routed to PCP and pumonologist.      Comments: Discharge ITP

## 2018-01-29 DIAGNOSIS — I82409 Acute embolism and thrombosis of unspecified deep veins of unspecified lower extremity: Secondary | ICD-10-CM | POA: Diagnosis not present

## 2018-01-29 DIAGNOSIS — J449 Chronic obstructive pulmonary disease, unspecified: Secondary | ICD-10-CM | POA: Diagnosis not present

## 2018-01-29 DIAGNOSIS — J9620 Acute and chronic respiratory failure, unspecified whether with hypoxia or hypercapnia: Secondary | ICD-10-CM | POA: Diagnosis not present

## 2018-01-29 DIAGNOSIS — I10 Essential (primary) hypertension: Secondary | ICD-10-CM | POA: Diagnosis not present

## 2018-02-01 DIAGNOSIS — I10 Essential (primary) hypertension: Secondary | ICD-10-CM | POA: Diagnosis not present

## 2018-02-01 DIAGNOSIS — J9610 Chronic respiratory failure, unspecified whether with hypoxia or hypercapnia: Secondary | ICD-10-CM | POA: Diagnosis not present

## 2018-02-01 DIAGNOSIS — J449 Chronic obstructive pulmonary disease, unspecified: Secondary | ICD-10-CM | POA: Diagnosis not present

## 2018-02-01 DIAGNOSIS — R0902 Hypoxemia: Secondary | ICD-10-CM | POA: Diagnosis not present

## 2018-02-16 DIAGNOSIS — I2584 Coronary atherosclerosis due to calcified coronary lesion: Secondary | ICD-10-CM | POA: Diagnosis not present

## 2018-02-16 DIAGNOSIS — M5136 Other intervertebral disc degeneration, lumbar region: Secondary | ICD-10-CM | POA: Diagnosis not present

## 2018-02-16 DIAGNOSIS — J449 Chronic obstructive pulmonary disease, unspecified: Secondary | ICD-10-CM | POA: Diagnosis not present

## 2018-02-16 DIAGNOSIS — I739 Peripheral vascular disease, unspecified: Secondary | ICD-10-CM | POA: Diagnosis not present

## 2018-02-16 DIAGNOSIS — K219 Gastro-esophageal reflux disease without esophagitis: Secondary | ICD-10-CM | POA: Diagnosis not present

## 2018-02-16 DIAGNOSIS — I2510 Atherosclerotic heart disease of native coronary artery without angina pectoris: Secondary | ICD-10-CM | POA: Diagnosis not present

## 2018-02-16 DIAGNOSIS — D649 Anemia, unspecified: Secondary | ICD-10-CM | POA: Diagnosis not present

## 2018-02-16 DIAGNOSIS — I10 Essential (primary) hypertension: Secondary | ICD-10-CM | POA: Diagnosis not present

## 2018-02-21 DIAGNOSIS — D649 Anemia, unspecified: Secondary | ICD-10-CM | POA: Diagnosis not present

## 2018-02-21 DIAGNOSIS — I6523 Occlusion and stenosis of bilateral carotid arteries: Secondary | ICD-10-CM | POA: Diagnosis not present

## 2018-02-21 DIAGNOSIS — I10 Essential (primary) hypertension: Secondary | ICD-10-CM | POA: Diagnosis not present

## 2018-02-21 DIAGNOSIS — M47816 Spondylosis without myelopathy or radiculopathy, lumbar region: Secondary | ICD-10-CM | POA: Diagnosis not present

## 2018-02-21 DIAGNOSIS — Z0000 Encounter for general adult medical examination without abnormal findings: Secondary | ICD-10-CM | POA: Diagnosis not present

## 2018-02-21 DIAGNOSIS — K219 Gastro-esophageal reflux disease without esophagitis: Secondary | ICD-10-CM | POA: Diagnosis not present

## 2018-02-21 DIAGNOSIS — I2510 Atherosclerotic heart disease of native coronary artery without angina pectoris: Secondary | ICD-10-CM | POA: Diagnosis not present

## 2018-02-21 DIAGNOSIS — I739 Peripheral vascular disease, unspecified: Secondary | ICD-10-CM | POA: Diagnosis not present

## 2018-02-21 DIAGNOSIS — L821 Other seborrheic keratosis: Secondary | ICD-10-CM | POA: Diagnosis not present

## 2018-02-22 DIAGNOSIS — M5416 Radiculopathy, lumbar region: Secondary | ICD-10-CM | POA: Diagnosis not present

## 2018-02-22 DIAGNOSIS — M5136 Other intervertebral disc degeneration, lumbar region: Secondary | ICD-10-CM | POA: Diagnosis not present

## 2018-03-01 DIAGNOSIS — I10 Essential (primary) hypertension: Secondary | ICD-10-CM | POA: Diagnosis not present

## 2018-03-01 DIAGNOSIS — J449 Chronic obstructive pulmonary disease, unspecified: Secondary | ICD-10-CM | POA: Diagnosis not present

## 2018-03-01 DIAGNOSIS — J9620 Acute and chronic respiratory failure, unspecified whether with hypoxia or hypercapnia: Secondary | ICD-10-CM | POA: Diagnosis not present

## 2018-03-01 DIAGNOSIS — I82409 Acute embolism and thrombosis of unspecified deep veins of unspecified lower extremity: Secondary | ICD-10-CM | POA: Diagnosis not present

## 2018-03-04 DIAGNOSIS — R0902 Hypoxemia: Secondary | ICD-10-CM | POA: Diagnosis not present

## 2018-03-04 DIAGNOSIS — J9610 Chronic respiratory failure, unspecified whether with hypoxia or hypercapnia: Secondary | ICD-10-CM | POA: Diagnosis not present

## 2018-03-04 DIAGNOSIS — I10 Essential (primary) hypertension: Secondary | ICD-10-CM | POA: Diagnosis not present

## 2018-03-04 DIAGNOSIS — J449 Chronic obstructive pulmonary disease, unspecified: Secondary | ICD-10-CM | POA: Diagnosis not present

## 2018-03-08 DIAGNOSIS — M5136 Other intervertebral disc degeneration, lumbar region: Secondary | ICD-10-CM | POA: Diagnosis not present

## 2018-03-08 DIAGNOSIS — M5416 Radiculopathy, lumbar region: Secondary | ICD-10-CM | POA: Diagnosis not present

## 2018-03-22 DIAGNOSIS — I6523 Occlusion and stenosis of bilateral carotid arteries: Secondary | ICD-10-CM | POA: Diagnosis not present

## 2018-03-22 DIAGNOSIS — I700 Atherosclerosis of aorta: Secondary | ICD-10-CM | POA: Diagnosis not present

## 2018-03-22 DIAGNOSIS — I2510 Atherosclerotic heart disease of native coronary artery without angina pectoris: Secondary | ICD-10-CM | POA: Diagnosis not present

## 2018-03-22 DIAGNOSIS — I10 Essential (primary) hypertension: Secondary | ICD-10-CM | POA: Diagnosis not present

## 2018-03-22 DIAGNOSIS — E7800 Pure hypercholesterolemia, unspecified: Secondary | ICD-10-CM | POA: Diagnosis not present

## 2018-03-31 DIAGNOSIS — J449 Chronic obstructive pulmonary disease, unspecified: Secondary | ICD-10-CM | POA: Diagnosis not present

## 2018-03-31 DIAGNOSIS — J9620 Acute and chronic respiratory failure, unspecified whether with hypoxia or hypercapnia: Secondary | ICD-10-CM | POA: Diagnosis not present

## 2018-03-31 DIAGNOSIS — I82409 Acute embolism and thrombosis of unspecified deep veins of unspecified lower extremity: Secondary | ICD-10-CM | POA: Diagnosis not present

## 2018-03-31 DIAGNOSIS — I10 Essential (primary) hypertension: Secondary | ICD-10-CM | POA: Diagnosis not present

## 2018-04-03 DIAGNOSIS — I10 Essential (primary) hypertension: Secondary | ICD-10-CM | POA: Diagnosis not present

## 2018-04-03 DIAGNOSIS — J449 Chronic obstructive pulmonary disease, unspecified: Secondary | ICD-10-CM | POA: Diagnosis not present

## 2018-04-03 DIAGNOSIS — J9610 Chronic respiratory failure, unspecified whether with hypoxia or hypercapnia: Secondary | ICD-10-CM | POA: Diagnosis not present

## 2018-04-03 DIAGNOSIS — R0902 Hypoxemia: Secondary | ICD-10-CM | POA: Diagnosis not present

## 2018-04-11 ENCOUNTER — Other Ambulatory Visit: Payer: Self-pay | Admitting: Internal Medicine

## 2018-04-11 DIAGNOSIS — Z1231 Encounter for screening mammogram for malignant neoplasm of breast: Secondary | ICD-10-CM

## 2018-04-18 DIAGNOSIS — I2510 Atherosclerotic heart disease of native coronary artery without angina pectoris: Secondary | ICD-10-CM | POA: Diagnosis not present

## 2018-04-18 DIAGNOSIS — I10 Essential (primary) hypertension: Secondary | ICD-10-CM | POA: Diagnosis not present

## 2018-05-01 DIAGNOSIS — J9620 Acute and chronic respiratory failure, unspecified whether with hypoxia or hypercapnia: Secondary | ICD-10-CM | POA: Diagnosis not present

## 2018-05-01 DIAGNOSIS — I82409 Acute embolism and thrombosis of unspecified deep veins of unspecified lower extremity: Secondary | ICD-10-CM | POA: Diagnosis not present

## 2018-05-01 DIAGNOSIS — J449 Chronic obstructive pulmonary disease, unspecified: Secondary | ICD-10-CM | POA: Diagnosis not present

## 2018-05-01 DIAGNOSIS — I10 Essential (primary) hypertension: Secondary | ICD-10-CM | POA: Diagnosis not present

## 2018-05-02 DIAGNOSIS — M5416 Radiculopathy, lumbar region: Secondary | ICD-10-CM | POA: Diagnosis not present

## 2018-05-02 DIAGNOSIS — M5136 Other intervertebral disc degeneration, lumbar region: Secondary | ICD-10-CM | POA: Diagnosis not present

## 2018-05-04 DIAGNOSIS — I10 Essential (primary) hypertension: Secondary | ICD-10-CM | POA: Diagnosis not present

## 2018-05-04 DIAGNOSIS — J9610 Chronic respiratory failure, unspecified whether with hypoxia or hypercapnia: Secondary | ICD-10-CM | POA: Diagnosis not present

## 2018-05-04 DIAGNOSIS — J449 Chronic obstructive pulmonary disease, unspecified: Secondary | ICD-10-CM | POA: Diagnosis not present

## 2018-05-04 DIAGNOSIS — R0902 Hypoxemia: Secondary | ICD-10-CM | POA: Diagnosis not present

## 2018-05-10 ENCOUNTER — Ambulatory Visit
Admission: RE | Admit: 2018-05-10 | Discharge: 2018-05-10 | Disposition: A | Discharge status: Home / Self Care | Payer: Medicare HMO | Source: Ambulatory Visit | Attending: Internal Medicine | Admitting: Internal Medicine

## 2018-05-10 DIAGNOSIS — Z1231 Encounter for screening mammogram for malignant neoplasm of breast: Secondary | ICD-10-CM | POA: Insufficient documentation

## 2018-05-12 DIAGNOSIS — M5136 Other intervertebral disc degeneration, lumbar region: Secondary | ICD-10-CM | POA: Diagnosis not present

## 2018-05-12 DIAGNOSIS — M5416 Radiculopathy, lumbar region: Secondary | ICD-10-CM | POA: Diagnosis not present

## 2018-05-31 DIAGNOSIS — I82409 Acute embolism and thrombosis of unspecified deep veins of unspecified lower extremity: Secondary | ICD-10-CM | POA: Diagnosis not present

## 2018-05-31 DIAGNOSIS — J449 Chronic obstructive pulmonary disease, unspecified: Secondary | ICD-10-CM | POA: Diagnosis not present

## 2018-05-31 DIAGNOSIS — J9620 Acute and chronic respiratory failure, unspecified whether with hypoxia or hypercapnia: Secondary | ICD-10-CM | POA: Diagnosis not present

## 2018-05-31 DIAGNOSIS — I10 Essential (primary) hypertension: Secondary | ICD-10-CM | POA: Diagnosis not present

## 2018-06-03 DIAGNOSIS — J9610 Chronic respiratory failure, unspecified whether with hypoxia or hypercapnia: Secondary | ICD-10-CM | POA: Diagnosis not present

## 2018-06-03 DIAGNOSIS — I10 Essential (primary) hypertension: Secondary | ICD-10-CM | POA: Diagnosis not present

## 2018-06-03 DIAGNOSIS — J449 Chronic obstructive pulmonary disease, unspecified: Secondary | ICD-10-CM | POA: Diagnosis not present

## 2018-06-03 DIAGNOSIS — R0902 Hypoxemia: Secondary | ICD-10-CM | POA: Diagnosis not present

## 2018-06-10 DIAGNOSIS — M5416 Radiculopathy, lumbar region: Secondary | ICD-10-CM | POA: Diagnosis not present

## 2018-06-10 DIAGNOSIS — M1711 Unilateral primary osteoarthritis, right knee: Secondary | ICD-10-CM | POA: Diagnosis not present

## 2018-06-10 DIAGNOSIS — M5136 Other intervertebral disc degeneration, lumbar region: Secondary | ICD-10-CM | POA: Diagnosis not present

## 2018-06-14 DIAGNOSIS — I2510 Atherosclerotic heart disease of native coronary artery without angina pectoris: Secondary | ICD-10-CM | POA: Diagnosis not present

## 2018-06-14 DIAGNOSIS — E7800 Pure hypercholesterolemia, unspecified: Secondary | ICD-10-CM | POA: Diagnosis not present

## 2018-06-14 DIAGNOSIS — I712 Thoracic aortic aneurysm, without rupture: Secondary | ICD-10-CM | POA: Diagnosis not present

## 2018-06-14 DIAGNOSIS — I6523 Occlusion and stenosis of bilateral carotid arteries: Secondary | ICD-10-CM | POA: Diagnosis not present

## 2018-06-14 DIAGNOSIS — I10 Essential (primary) hypertension: Secondary | ICD-10-CM | POA: Diagnosis not present

## 2018-06-14 DIAGNOSIS — I700 Atherosclerosis of aorta: Secondary | ICD-10-CM | POA: Diagnosis not present

## 2018-06-22 DIAGNOSIS — I10 Essential (primary) hypertension: Secondary | ICD-10-CM | POA: Diagnosis not present

## 2018-06-22 DIAGNOSIS — L821 Other seborrheic keratosis: Secondary | ICD-10-CM | POA: Diagnosis not present

## 2018-06-22 DIAGNOSIS — I6523 Occlusion and stenosis of bilateral carotid arteries: Secondary | ICD-10-CM | POA: Diagnosis not present

## 2018-06-22 DIAGNOSIS — M47816 Spondylosis without myelopathy or radiculopathy, lumbar region: Secondary | ICD-10-CM | POA: Diagnosis not present

## 2018-06-22 DIAGNOSIS — D649 Anemia, unspecified: Secondary | ICD-10-CM | POA: Diagnosis not present

## 2018-06-22 DIAGNOSIS — I2510 Atherosclerotic heart disease of native coronary artery without angina pectoris: Secondary | ICD-10-CM | POA: Diagnosis not present

## 2018-06-22 DIAGNOSIS — K219 Gastro-esophageal reflux disease without esophagitis: Secondary | ICD-10-CM | POA: Diagnosis not present

## 2018-06-28 DIAGNOSIS — Z0000 Encounter for general adult medical examination without abnormal findings: Secondary | ICD-10-CM | POA: Diagnosis not present

## 2018-06-28 DIAGNOSIS — E7800 Pure hypercholesterolemia, unspecified: Secondary | ICD-10-CM | POA: Diagnosis not present

## 2018-06-28 DIAGNOSIS — M159 Polyosteoarthritis, unspecified: Secondary | ICD-10-CM | POA: Diagnosis not present

## 2018-06-28 DIAGNOSIS — I2510 Atherosclerotic heart disease of native coronary artery without angina pectoris: Secondary | ICD-10-CM | POA: Diagnosis not present

## 2018-06-28 DIAGNOSIS — J9611 Chronic respiratory failure with hypoxia: Secondary | ICD-10-CM | POA: Diagnosis not present

## 2018-06-28 DIAGNOSIS — J432 Centrilobular emphysema: Secondary | ICD-10-CM | POA: Diagnosis not present

## 2018-06-28 DIAGNOSIS — I10 Essential (primary) hypertension: Secondary | ICD-10-CM | POA: Diagnosis not present

## 2018-06-28 DIAGNOSIS — M47816 Spondylosis without myelopathy or radiculopathy, lumbar region: Secondary | ICD-10-CM | POA: Diagnosis not present

## 2018-06-28 DIAGNOSIS — I739 Peripheral vascular disease, unspecified: Secondary | ICD-10-CM | POA: Diagnosis not present

## 2018-07-01 DIAGNOSIS — I82409 Acute embolism and thrombosis of unspecified deep veins of unspecified lower extremity: Secondary | ICD-10-CM | POA: Diagnosis not present

## 2018-07-01 DIAGNOSIS — J449 Chronic obstructive pulmonary disease, unspecified: Secondary | ICD-10-CM | POA: Diagnosis not present

## 2018-07-01 DIAGNOSIS — I10 Essential (primary) hypertension: Secondary | ICD-10-CM | POA: Diagnosis not present

## 2018-07-01 DIAGNOSIS — J9620 Acute and chronic respiratory failure, unspecified whether with hypoxia or hypercapnia: Secondary | ICD-10-CM | POA: Diagnosis not present

## 2018-07-04 DIAGNOSIS — I10 Essential (primary) hypertension: Secondary | ICD-10-CM | POA: Diagnosis not present

## 2018-07-04 DIAGNOSIS — J9610 Chronic respiratory failure, unspecified whether with hypoxia or hypercapnia: Secondary | ICD-10-CM | POA: Diagnosis not present

## 2018-07-04 DIAGNOSIS — J449 Chronic obstructive pulmonary disease, unspecified: Secondary | ICD-10-CM | POA: Diagnosis not present

## 2018-07-04 DIAGNOSIS — R0902 Hypoxemia: Secondary | ICD-10-CM | POA: Diagnosis not present

## 2018-07-26 DIAGNOSIS — M5136 Other intervertebral disc degeneration, lumbar region: Secondary | ICD-10-CM | POA: Diagnosis not present

## 2018-07-26 DIAGNOSIS — M5416 Radiculopathy, lumbar region: Secondary | ICD-10-CM | POA: Diagnosis not present

## 2018-08-01 DIAGNOSIS — J9620 Acute and chronic respiratory failure, unspecified whether with hypoxia or hypercapnia: Secondary | ICD-10-CM | POA: Diagnosis not present

## 2018-08-01 DIAGNOSIS — J449 Chronic obstructive pulmonary disease, unspecified: Secondary | ICD-10-CM | POA: Diagnosis not present

## 2018-08-01 DIAGNOSIS — I10 Essential (primary) hypertension: Secondary | ICD-10-CM | POA: Diagnosis not present

## 2018-08-01 DIAGNOSIS — I82409 Acute embolism and thrombosis of unspecified deep veins of unspecified lower extremity: Secondary | ICD-10-CM | POA: Diagnosis not present

## 2018-08-04 DIAGNOSIS — J432 Centrilobular emphysema: Secondary | ICD-10-CM | POA: Diagnosis not present

## 2018-08-04 DIAGNOSIS — J9610 Chronic respiratory failure, unspecified whether with hypoxia or hypercapnia: Secondary | ICD-10-CM | POA: Diagnosis not present

## 2018-08-04 DIAGNOSIS — I10 Essential (primary) hypertension: Secondary | ICD-10-CM | POA: Diagnosis not present

## 2018-08-04 DIAGNOSIS — R0902 Hypoxemia: Secondary | ICD-10-CM | POA: Diagnosis not present

## 2018-08-04 DIAGNOSIS — J449 Chronic obstructive pulmonary disease, unspecified: Secondary | ICD-10-CM | POA: Diagnosis not present

## 2018-08-25 DIAGNOSIS — J432 Centrilobular emphysema: Secondary | ICD-10-CM | POA: Diagnosis not present

## 2018-08-25 DIAGNOSIS — I10 Essential (primary) hypertension: Secondary | ICD-10-CM | POA: Diagnosis not present

## 2018-08-25 DIAGNOSIS — R42 Dizziness and giddiness: Secondary | ICD-10-CM | POA: Diagnosis not present

## 2018-08-25 DIAGNOSIS — I739 Peripheral vascular disease, unspecified: Secondary | ICD-10-CM | POA: Diagnosis not present

## 2018-08-25 DIAGNOSIS — J9611 Chronic respiratory failure with hypoxia: Secondary | ICD-10-CM | POA: Diagnosis not present

## 2018-08-25 DIAGNOSIS — R8290 Unspecified abnormal findings in urine: Secondary | ICD-10-CM | POA: Diagnosis not present

## 2018-08-25 DIAGNOSIS — D649 Anemia, unspecified: Secondary | ICD-10-CM | POA: Diagnosis not present

## 2018-08-25 DIAGNOSIS — R4189 Other symptoms and signs involving cognitive functions and awareness: Secondary | ICD-10-CM | POA: Diagnosis not present

## 2018-08-25 DIAGNOSIS — E538 Deficiency of other specified B group vitamins: Secondary | ICD-10-CM | POA: Diagnosis not present

## 2018-08-30 DIAGNOSIS — J449 Chronic obstructive pulmonary disease, unspecified: Secondary | ICD-10-CM | POA: Diagnosis not present

## 2018-08-30 DIAGNOSIS — J9620 Acute and chronic respiratory failure, unspecified whether with hypoxia or hypercapnia: Secondary | ICD-10-CM | POA: Diagnosis not present

## 2018-08-30 DIAGNOSIS — I82409 Acute embolism and thrombosis of unspecified deep veins of unspecified lower extremity: Secondary | ICD-10-CM | POA: Diagnosis not present

## 2018-08-30 DIAGNOSIS — I10 Essential (primary) hypertension: Secondary | ICD-10-CM | POA: Diagnosis not present

## 2018-09-02 DIAGNOSIS — J9610 Chronic respiratory failure, unspecified whether with hypoxia or hypercapnia: Secondary | ICD-10-CM | POA: Diagnosis not present

## 2018-09-02 DIAGNOSIS — R0902 Hypoxemia: Secondary | ICD-10-CM | POA: Diagnosis not present

## 2018-09-02 DIAGNOSIS — J449 Chronic obstructive pulmonary disease, unspecified: Secondary | ICD-10-CM | POA: Diagnosis not present

## 2018-09-02 DIAGNOSIS — I10 Essential (primary) hypertension: Secondary | ICD-10-CM | POA: Diagnosis not present

## 2018-09-09 DIAGNOSIS — M5136 Other intervertebral disc degeneration, lumbar region: Secondary | ICD-10-CM | POA: Diagnosis not present

## 2018-09-30 DIAGNOSIS — J449 Chronic obstructive pulmonary disease, unspecified: Secondary | ICD-10-CM | POA: Diagnosis not present

## 2018-09-30 DIAGNOSIS — I82409 Acute embolism and thrombosis of unspecified deep veins of unspecified lower extremity: Secondary | ICD-10-CM | POA: Diagnosis not present

## 2018-09-30 DIAGNOSIS — J9620 Acute and chronic respiratory failure, unspecified whether with hypoxia or hypercapnia: Secondary | ICD-10-CM | POA: Diagnosis not present

## 2018-09-30 DIAGNOSIS — I10 Essential (primary) hypertension: Secondary | ICD-10-CM | POA: Diagnosis not present

## 2018-10-03 DIAGNOSIS — R0902 Hypoxemia: Secondary | ICD-10-CM | POA: Diagnosis not present

## 2018-10-03 DIAGNOSIS — I10 Essential (primary) hypertension: Secondary | ICD-10-CM | POA: Diagnosis not present

## 2018-10-03 DIAGNOSIS — J449 Chronic obstructive pulmonary disease, unspecified: Secondary | ICD-10-CM | POA: Diagnosis not present

## 2018-10-03 DIAGNOSIS — J9610 Chronic respiratory failure, unspecified whether with hypoxia or hypercapnia: Secondary | ICD-10-CM | POA: Diagnosis not present

## 2018-10-20 DIAGNOSIS — Z0000 Encounter for general adult medical examination without abnormal findings: Secondary | ICD-10-CM | POA: Diagnosis not present

## 2018-10-20 DIAGNOSIS — M159 Polyosteoarthritis, unspecified: Secondary | ICD-10-CM | POA: Diagnosis not present

## 2018-10-20 DIAGNOSIS — I2510 Atherosclerotic heart disease of native coronary artery without angina pectoris: Secondary | ICD-10-CM | POA: Diagnosis not present

## 2018-10-20 DIAGNOSIS — J9611 Chronic respiratory failure with hypoxia: Secondary | ICD-10-CM | POA: Diagnosis not present

## 2018-10-20 DIAGNOSIS — R8290 Unspecified abnormal findings in urine: Secondary | ICD-10-CM | POA: Diagnosis not present

## 2018-10-20 DIAGNOSIS — M47816 Spondylosis without myelopathy or radiculopathy, lumbar region: Secondary | ICD-10-CM | POA: Diagnosis not present

## 2018-10-20 DIAGNOSIS — J432 Centrilobular emphysema: Secondary | ICD-10-CM | POA: Diagnosis not present

## 2018-10-20 DIAGNOSIS — I739 Peripheral vascular disease, unspecified: Secondary | ICD-10-CM | POA: Diagnosis not present

## 2018-10-20 DIAGNOSIS — I10 Essential (primary) hypertension: Secondary | ICD-10-CM | POA: Diagnosis not present

## 2018-10-20 DIAGNOSIS — E7800 Pure hypercholesterolemia, unspecified: Secondary | ICD-10-CM | POA: Diagnosis not present

## 2018-10-27 DIAGNOSIS — J449 Chronic obstructive pulmonary disease, unspecified: Secondary | ICD-10-CM | POA: Diagnosis not present

## 2018-10-28 DIAGNOSIS — J449 Chronic obstructive pulmonary disease, unspecified: Secondary | ICD-10-CM | POA: Diagnosis not present

## 2018-11-02 DIAGNOSIS — I10 Essential (primary) hypertension: Secondary | ICD-10-CM | POA: Diagnosis not present

## 2018-11-02 DIAGNOSIS — R0902 Hypoxemia: Secondary | ICD-10-CM | POA: Diagnosis not present

## 2018-11-02 DIAGNOSIS — J9610 Chronic respiratory failure, unspecified whether with hypoxia or hypercapnia: Secondary | ICD-10-CM | POA: Diagnosis not present

## 2018-11-02 DIAGNOSIS — J449 Chronic obstructive pulmonary disease, unspecified: Secondary | ICD-10-CM | POA: Diagnosis not present

## 2018-11-28 DIAGNOSIS — J449 Chronic obstructive pulmonary disease, unspecified: Secondary | ICD-10-CM | POA: Diagnosis not present

## 2018-12-03 DIAGNOSIS — R0902 Hypoxemia: Secondary | ICD-10-CM | POA: Diagnosis not present

## 2018-12-03 DIAGNOSIS — I10 Essential (primary) hypertension: Secondary | ICD-10-CM | POA: Diagnosis not present

## 2018-12-03 DIAGNOSIS — J9610 Chronic respiratory failure, unspecified whether with hypoxia or hypercapnia: Secondary | ICD-10-CM | POA: Diagnosis not present

## 2018-12-03 DIAGNOSIS — J449 Chronic obstructive pulmonary disease, unspecified: Secondary | ICD-10-CM | POA: Diagnosis not present

## 2018-12-07 DIAGNOSIS — H26493 Other secondary cataract, bilateral: Secondary | ICD-10-CM | POA: Diagnosis not present

## 2018-12-27 DIAGNOSIS — E7800 Pure hypercholesterolemia, unspecified: Secondary | ICD-10-CM | POA: Diagnosis not present

## 2018-12-27 DIAGNOSIS — I6523 Occlusion and stenosis of bilateral carotid arteries: Secondary | ICD-10-CM | POA: Diagnosis not present

## 2018-12-27 DIAGNOSIS — I25118 Atherosclerotic heart disease of native coronary artery with other forms of angina pectoris: Secondary | ICD-10-CM | POA: Diagnosis not present

## 2018-12-27 DIAGNOSIS — I10 Essential (primary) hypertension: Secondary | ICD-10-CM | POA: Diagnosis not present

## 2018-12-27 DIAGNOSIS — I700 Atherosclerosis of aorta: Secondary | ICD-10-CM | POA: Diagnosis not present

## 2018-12-27 DIAGNOSIS — I2510 Atherosclerotic heart disease of native coronary artery without angina pectoris: Secondary | ICD-10-CM | POA: Diagnosis not present

## 2018-12-27 DIAGNOSIS — J432 Centrilobular emphysema: Secondary | ICD-10-CM | POA: Diagnosis not present

## 2018-12-28 DIAGNOSIS — J449 Chronic obstructive pulmonary disease, unspecified: Secondary | ICD-10-CM | POA: Diagnosis not present

## 2018-12-28 DIAGNOSIS — M5416 Radiculopathy, lumbar region: Secondary | ICD-10-CM | POA: Diagnosis not present

## 2018-12-28 DIAGNOSIS — M5136 Other intervertebral disc degeneration, lumbar region: Secondary | ICD-10-CM | POA: Diagnosis not present

## 2019-01-02 DIAGNOSIS — R0902 Hypoxemia: Secondary | ICD-10-CM | POA: Diagnosis not present

## 2019-01-02 DIAGNOSIS — J9610 Chronic respiratory failure, unspecified whether with hypoxia or hypercapnia: Secondary | ICD-10-CM | POA: Diagnosis not present

## 2019-01-02 DIAGNOSIS — I10 Essential (primary) hypertension: Secondary | ICD-10-CM | POA: Diagnosis not present

## 2019-01-02 DIAGNOSIS — J449 Chronic obstructive pulmonary disease, unspecified: Secondary | ICD-10-CM | POA: Diagnosis not present

## 2019-01-28 DIAGNOSIS — J449 Chronic obstructive pulmonary disease, unspecified: Secondary | ICD-10-CM | POA: Diagnosis not present

## 2019-02-02 DIAGNOSIS — I10 Essential (primary) hypertension: Secondary | ICD-10-CM | POA: Diagnosis not present

## 2019-02-02 DIAGNOSIS — J9610 Chronic respiratory failure, unspecified whether with hypoxia or hypercapnia: Secondary | ICD-10-CM | POA: Diagnosis not present

## 2019-02-02 DIAGNOSIS — J449 Chronic obstructive pulmonary disease, unspecified: Secondary | ICD-10-CM | POA: Diagnosis not present

## 2019-02-02 DIAGNOSIS — R0902 Hypoxemia: Secondary | ICD-10-CM | POA: Diagnosis not present

## 2019-02-28 DIAGNOSIS — J449 Chronic obstructive pulmonary disease, unspecified: Secondary | ICD-10-CM | POA: Diagnosis not present

## 2019-03-05 DIAGNOSIS — J9610 Chronic respiratory failure, unspecified whether with hypoxia or hypercapnia: Secondary | ICD-10-CM | POA: Diagnosis not present

## 2019-03-05 DIAGNOSIS — J449 Chronic obstructive pulmonary disease, unspecified: Secondary | ICD-10-CM | POA: Diagnosis not present

## 2019-03-05 DIAGNOSIS — I10 Essential (primary) hypertension: Secondary | ICD-10-CM | POA: Diagnosis not present

## 2019-03-05 DIAGNOSIS — R0902 Hypoxemia: Secondary | ICD-10-CM | POA: Diagnosis not present

## 2019-03-06 DIAGNOSIS — M5136 Other intervertebral disc degeneration, lumbar region: Secondary | ICD-10-CM | POA: Diagnosis not present

## 2019-03-06 DIAGNOSIS — M5416 Radiculopathy, lumbar region: Secondary | ICD-10-CM | POA: Diagnosis not present

## 2019-03-06 DIAGNOSIS — M1711 Unilateral primary osteoarthritis, right knee: Secondary | ICD-10-CM | POA: Diagnosis not present

## 2019-03-07 DIAGNOSIS — Z1159 Encounter for screening for other viral diseases: Secondary | ICD-10-CM | POA: Diagnosis not present

## 2019-03-07 DIAGNOSIS — M5416 Radiculopathy, lumbar region: Secondary | ICD-10-CM | POA: Diagnosis not present

## 2019-03-07 DIAGNOSIS — M5136 Other intervertebral disc degeneration, lumbar region: Secondary | ICD-10-CM | POA: Diagnosis not present

## 2019-03-09 DIAGNOSIS — J432 Centrilobular emphysema: Secondary | ICD-10-CM | POA: Diagnosis not present

## 2019-03-09 DIAGNOSIS — Z23 Encounter for immunization: Secondary | ICD-10-CM | POA: Diagnosis not present

## 2019-03-30 DIAGNOSIS — J449 Chronic obstructive pulmonary disease, unspecified: Secondary | ICD-10-CM | POA: Diagnosis not present

## 2019-04-04 DIAGNOSIS — J9610 Chronic respiratory failure, unspecified whether with hypoxia or hypercapnia: Secondary | ICD-10-CM | POA: Diagnosis not present

## 2019-04-04 DIAGNOSIS — I10 Essential (primary) hypertension: Secondary | ICD-10-CM | POA: Diagnosis not present

## 2019-04-04 DIAGNOSIS — R0902 Hypoxemia: Secondary | ICD-10-CM | POA: Diagnosis not present

## 2019-04-04 DIAGNOSIS — M5136 Other intervertebral disc degeneration, lumbar region: Secondary | ICD-10-CM | POA: Diagnosis not present

## 2019-04-04 DIAGNOSIS — J449 Chronic obstructive pulmonary disease, unspecified: Secondary | ICD-10-CM | POA: Diagnosis not present

## 2019-04-04 DIAGNOSIS — M5416 Radiculopathy, lumbar region: Secondary | ICD-10-CM | POA: Diagnosis not present

## 2019-04-30 DIAGNOSIS — J449 Chronic obstructive pulmonary disease, unspecified: Secondary | ICD-10-CM | POA: Diagnosis not present

## 2019-05-01 DIAGNOSIS — M5417 Radiculopathy, lumbosacral region: Secondary | ICD-10-CM | POA: Diagnosis not present

## 2019-05-01 DIAGNOSIS — M545 Low back pain: Secondary | ICD-10-CM | POA: Diagnosis not present

## 2019-05-01 DIAGNOSIS — I712 Thoracic aortic aneurysm, without rupture: Secondary | ICD-10-CM | POA: Diagnosis not present

## 2019-05-01 DIAGNOSIS — L821 Other seborrheic keratosis: Secondary | ICD-10-CM | POA: Diagnosis not present

## 2019-05-01 DIAGNOSIS — D649 Anemia, unspecified: Secondary | ICD-10-CM | POA: Diagnosis not present

## 2019-05-01 DIAGNOSIS — I10 Essential (primary) hypertension: Secondary | ICD-10-CM | POA: Diagnosis not present

## 2019-05-01 DIAGNOSIS — J432 Centrilobular emphysema: Secondary | ICD-10-CM | POA: Diagnosis not present

## 2019-05-01 DIAGNOSIS — I739 Peripheral vascular disease, unspecified: Secondary | ICD-10-CM | POA: Diagnosis not present

## 2019-05-01 DIAGNOSIS — J449 Chronic obstructive pulmonary disease, unspecified: Secondary | ICD-10-CM | POA: Diagnosis not present

## 2019-05-01 DIAGNOSIS — B0223 Postherpetic polyneuropathy: Secondary | ICD-10-CM | POA: Diagnosis not present

## 2019-05-01 DIAGNOSIS — F419 Anxiety disorder, unspecified: Secondary | ICD-10-CM | POA: Diagnosis not present

## 2019-05-01 DIAGNOSIS — M47816 Spondylosis without myelopathy or radiculopathy, lumbar region: Secondary | ICD-10-CM | POA: Diagnosis not present

## 2019-05-01 DIAGNOSIS — Z87891 Personal history of nicotine dependence: Secondary | ICD-10-CM | POA: Diagnosis not present

## 2019-05-01 DIAGNOSIS — Z9981 Dependence on supplemental oxygen: Secondary | ICD-10-CM | POA: Diagnosis not present

## 2019-05-01 DIAGNOSIS — J9611 Chronic respiratory failure with hypoxia: Secondary | ICD-10-CM | POA: Diagnosis not present

## 2019-05-05 DIAGNOSIS — J449 Chronic obstructive pulmonary disease, unspecified: Secondary | ICD-10-CM | POA: Diagnosis not present

## 2019-05-05 DIAGNOSIS — I10 Essential (primary) hypertension: Secondary | ICD-10-CM | POA: Diagnosis not present

## 2019-05-05 DIAGNOSIS — J9610 Chronic respiratory failure, unspecified whether with hypoxia or hypercapnia: Secondary | ICD-10-CM | POA: Diagnosis not present

## 2019-05-05 DIAGNOSIS — R0902 Hypoxemia: Secondary | ICD-10-CM | POA: Diagnosis not present

## 2019-05-30 DIAGNOSIS — J449 Chronic obstructive pulmonary disease, unspecified: Secondary | ICD-10-CM | POA: Diagnosis not present

## 2019-06-04 DIAGNOSIS — J9610 Chronic respiratory failure, unspecified whether with hypoxia or hypercapnia: Secondary | ICD-10-CM | POA: Diagnosis not present

## 2019-06-04 DIAGNOSIS — R0902 Hypoxemia: Secondary | ICD-10-CM | POA: Diagnosis not present

## 2019-06-04 DIAGNOSIS — I10 Essential (primary) hypertension: Secondary | ICD-10-CM | POA: Diagnosis not present

## 2019-06-04 DIAGNOSIS — J449 Chronic obstructive pulmonary disease, unspecified: Secondary | ICD-10-CM | POA: Diagnosis not present

## 2019-06-12 DIAGNOSIS — I25118 Atherosclerotic heart disease of native coronary artery with other forms of angina pectoris: Secondary | ICD-10-CM | POA: Diagnosis not present

## 2019-06-12 DIAGNOSIS — I10 Essential (primary) hypertension: Secondary | ICD-10-CM | POA: Diagnosis not present

## 2019-06-12 DIAGNOSIS — I2510 Atherosclerotic heart disease of native coronary artery without angina pectoris: Secondary | ICD-10-CM | POA: Diagnosis not present

## 2019-06-12 DIAGNOSIS — I6523 Occlusion and stenosis of bilateral carotid arteries: Secondary | ICD-10-CM | POA: Diagnosis not present

## 2019-06-12 DIAGNOSIS — E7800 Pure hypercholesterolemia, unspecified: Secondary | ICD-10-CM | POA: Diagnosis not present

## 2019-06-12 DIAGNOSIS — I712 Thoracic aortic aneurysm, without rupture: Secondary | ICD-10-CM | POA: Diagnosis not present

## 2019-06-12 DIAGNOSIS — I739 Peripheral vascular disease, unspecified: Secondary | ICD-10-CM | POA: Diagnosis not present

## 2019-06-12 DIAGNOSIS — I700 Atherosclerosis of aorta: Secondary | ICD-10-CM | POA: Diagnosis not present

## 2019-06-30 DIAGNOSIS — J449 Chronic obstructive pulmonary disease, unspecified: Secondary | ICD-10-CM | POA: Diagnosis not present

## 2019-07-03 DIAGNOSIS — I712 Thoracic aortic aneurysm, without rupture: Secondary | ICD-10-CM | POA: Diagnosis not present

## 2019-07-03 DIAGNOSIS — I6523 Occlusion and stenosis of bilateral carotid arteries: Secondary | ICD-10-CM | POA: Diagnosis not present

## 2019-07-03 DIAGNOSIS — I719 Aortic aneurysm of unspecified site, without rupture: Secondary | ICD-10-CM | POA: Diagnosis not present

## 2019-07-05 ENCOUNTER — Encounter: Payer: Self-pay | Admitting: Intensive Care

## 2019-07-05 ENCOUNTER — Emergency Department
Admission: EM | Admit: 2019-07-05 | Discharge: 2019-07-05 | Disposition: A | Discharge status: Home / Self Care | Payer: Medicare HMO | Attending: Emergency Medicine | Admitting: Emergency Medicine

## 2019-07-05 ENCOUNTER — Other Ambulatory Visit: Payer: Self-pay

## 2019-07-05 DIAGNOSIS — M5136 Other intervertebral disc degeneration, lumbar region: Secondary | ICD-10-CM | POA: Diagnosis not present

## 2019-07-05 DIAGNOSIS — Z79899 Other long term (current) drug therapy: Secondary | ICD-10-CM | POA: Insufficient documentation

## 2019-07-05 DIAGNOSIS — I959 Hypotension, unspecified: Secondary | ICD-10-CM | POA: Diagnosis not present

## 2019-07-05 DIAGNOSIS — Z7982 Long term (current) use of aspirin: Secondary | ICD-10-CM | POA: Diagnosis not present

## 2019-07-05 DIAGNOSIS — I10 Essential (primary) hypertension: Secondary | ICD-10-CM | POA: Diagnosis not present

## 2019-07-05 DIAGNOSIS — M5416 Radiculopathy, lumbar region: Secondary | ICD-10-CM | POA: Diagnosis not present

## 2019-07-05 DIAGNOSIS — Z87891 Personal history of nicotine dependence: Secondary | ICD-10-CM | POA: Diagnosis not present

## 2019-07-05 DIAGNOSIS — J449 Chronic obstructive pulmonary disease, unspecified: Secondary | ICD-10-CM | POA: Diagnosis not present

## 2019-07-05 DIAGNOSIS — M1711 Unilateral primary osteoarthritis, right knee: Secondary | ICD-10-CM | POA: Diagnosis not present

## 2019-07-05 DIAGNOSIS — J9610 Chronic respiratory failure, unspecified whether with hypoxia or hypercapnia: Secondary | ICD-10-CM | POA: Diagnosis not present

## 2019-07-05 DIAGNOSIS — N390 Urinary tract infection, site not specified: Secondary | ICD-10-CM | POA: Diagnosis not present

## 2019-07-05 DIAGNOSIS — R0902 Hypoxemia: Secondary | ICD-10-CM | POA: Diagnosis not present

## 2019-07-05 LAB — URINALYSIS, COMPLETE (UACMP) WITH MICROSCOPIC
Bilirubin Urine: NEGATIVE
Glucose, UA: NEGATIVE mg/dL
Hgb urine dipstick: NEGATIVE
Ketones, ur: NEGATIVE mg/dL
Nitrite: NEGATIVE
Protein, ur: NEGATIVE mg/dL
Specific Gravity, Urine: 1.003 — ABNORMAL LOW (ref 1.005–1.030)
pH: 5 (ref 5.0–8.0)

## 2019-07-05 LAB — CBC WITH DIFFERENTIAL/PLATELET
Abs Immature Granulocytes: 0.01 10*3/uL (ref 0.00–0.07)
Basophils Absolute: 0.1 10*3/uL (ref 0.0–0.1)
Basophils Relative: 2 %
Eosinophils Absolute: 0.5 10*3/uL (ref 0.0–0.5)
Eosinophils Relative: 7 %
HCT: 35.8 % — ABNORMAL LOW (ref 36.0–46.0)
Hemoglobin: 11.2 g/dL — ABNORMAL LOW (ref 12.0–15.0)
Immature Granulocytes: 0 %
Lymphocytes Relative: 29 %
Lymphs Abs: 2 10*3/uL (ref 0.7–4.0)
MCH: 31.3 pg (ref 26.0–34.0)
MCHC: 31.3 g/dL (ref 30.0–36.0)
MCV: 100 fL (ref 80.0–100.0)
Monocytes Absolute: 0.6 10*3/uL (ref 0.1–1.0)
Monocytes Relative: 9 %
Neutro Abs: 3.7 10*3/uL (ref 1.7–7.7)
Neutrophils Relative %: 53 %
Platelets: 184 10*3/uL (ref 150–400)
RBC: 3.58 MIL/uL — ABNORMAL LOW (ref 3.87–5.11)
RDW: 14.2 % (ref 11.5–15.5)
WBC: 7 10*3/uL (ref 4.0–10.5)
nRBC: 0 % (ref 0.0–0.2)

## 2019-07-05 LAB — BASIC METABOLIC PANEL
Anion gap: 10 (ref 5–15)
BUN: 18 mg/dL (ref 8–23)
CO2: 25 mmol/L (ref 22–32)
Calcium: 9.6 mg/dL (ref 8.9–10.3)
Chloride: 107 mmol/L (ref 98–111)
Creatinine, Ser: 1.34 mg/dL — ABNORMAL HIGH (ref 0.44–1.00)
GFR calc Af Amer: 43 mL/min — ABNORMAL LOW (ref >60)
GFR calc non Af Amer: 37 mL/min — ABNORMAL LOW (ref >60)
Glucose, Bld: 95 mg/dL (ref 70–99)
Potassium: 4.3 mmol/L (ref 3.5–5.1)
Sodium: 142 mmol/L (ref 135–145)

## 2019-07-05 LAB — LACTIC ACID, PLASMA: Lactic Acid, Venous: 1.2 mmol/L (ref 0.5–1.9)

## 2019-07-05 MED ORDER — CEPHALEXIN 500 MG PO CAPS: 500.0000 mg | ORAL_CAPSULE | Freq: Two times a day (BID) | ORAL | 0 refills | Status: AC

## 2019-07-05 MED ORDER — CEPHALEXIN 500 MG PO CAPS
500.0000 mg | ORAL_CAPSULE | Freq: Once | ORAL | Status: AC
  Administered 2019-07-05: 500 mg via ORAL
  Filled 2019-07-05: qty 1

## 2019-07-05 NOTE — ED Triage Notes (Signed)
Patient was being seen at [facility] clinic today for routine checkup and was sent here for hypotension. Denies any symptoms or pain. Patient wears 2L O2 continuously.

## 2019-07-05 NOTE — Discharge Instructions (Signed)
Take the antibiotic as prescribed and finish the full 7-day course.  Make sure to drink plenty of fluids.  Return to the ER for new, worsening, or persistent weakness or lightheadedness, low blood pressure readings, fever, vomiting, or any other new or worsening symptoms that concern you.  Follow-up with your regular doctor in 1 week.

## 2019-07-05 NOTE — ED Provider Notes (Signed)
[facility] Emergency Department Provider Note ____________________________________________   First MD Initiated Contact with Patient 07/05/19 1507     (approximate)  I have reviewed the triage vital signs and the nursing notes.   HISTORY  Chief Complaint Hypotension    HPI Bethany [name] is a 84 y.o. female with PMH as noted below who was sent to the ED from an outpatient visit for a low blood pressure, measured to 75/53, and associated with an oxygen of 92% on the patient's normal 2 L.  Patient states that she has had some intermittent lightheadedness over the last few days but not currently, and she denies any other acute symptoms.  Past Medical History:  Diagnosis Date  . Allergy   . Anxiety   . Arthritis   . COPD (chronic obstructive pulmonary disease) (HCC)   . Hyperlipidemia   . Hypertension   . Oxygen deficiency     Patient Active Problem List   Diagnosis Date Noted  . Carotid stenosis 01/03/2017  . PAD (peripheral artery disease) (HCC) 01/03/2017  . Back pain, chronic 09/13/2015  . Degenerative arthritis of lumbar spine 06/13/2015  . Low serum cobalamin 03/21/2015  . Anxiety 09/25/2014  . Chronic obstructive pulmonary disease (HCC) 11/13/2013  . BP (high blood pressure) 11/13/2013  . HLD (hyperlipidemia) 11/13/2013    Past Surgical History:  Procedure Laterality Date  . ABDOMINAL HYSTERECTOMY    . SPINE SURGERY     Back surgery x8    Prior to Admission medications   Medication Sig Start Date End Date Taking? Authorizing Provider  albuterol (PROVENTIL HFA;VENTOLIN HFA) 108 (90 Base) MCG/ACT inhaler Inhale 2 puffs into the lungs every 6 (six) hours as needed for wheezing or shortness of breath. 03/23/16   Emily Filbert, MD  aspirin (ASPIRIN EC) 81 MG EC tablet Take 81 mg by mouth daily. Swallow whole.    [provider]  baclofen (LIORESAL) 10 MG tablet Take 10 mg by mouth 3 (three) times daily.    [provider]  CALCIUM CARBONATE PO Take 1 tablet by mouth daily.    [provider]  cephALEXin (KEFLEX) 500 MG capsule Take 1 capsule (500 mg total) by mouth 2 (two) times daily for 7 days. 07/05/19 07/12/19  Dionne Bucy, MD  cetirizine (ZYRTEC) 10 MG tablet Take 10 mg by mouth daily.    [provider]  Cholecalciferol (VITAMIN D3) 1000 units CAPS Take 1 capsule by mouth daily.    [provider]  diazepam (VALIUM) 5 MG tablet Take 5 mg by mouth at bedtime. Patient takes once at night Reported on 10/16/2015    [provider]  docusate sodium (COLACE) 100 MG capsule Take 100 mg by mouth 2 (two) times daily as needed for mild constipation.    [provider]  escitalopram (LEXAPRO) 5 MG tablet Take 10 mg by mouth daily.     [provider]  Fluticasone-Salmeterol (ADVAIR) 250-50 MCG/DOSE AEPB Inhale 1 puff into the lungs 2 (two) times daily.    [provider]  gabapentin (NEURONTIN) 300 MG capsule Take 300 mg by mouth 3 (three) times daily.    [provider]  HYDROcodone-acetaminophen (NORCO/VICODIN) 5-325 MG tablet Take 1 tablet by mouth 5 (five) times daily as needed for moderate pain. Do not exceed 5 times daily, patient at least 3 day    [provider]  losartan (COZAAR) 50 MG tablet Take 50 mg by mouth daily.    [provider]  lovastatin (MEVACOR) 20 MG tablet Take 20 mg by mouth at bedtime. Reported on 12/12/2015    [provider]  metoprolol succinate (TOPROL-XL) 25 MG 24 hr tablet Take 25 mg by mouth daily.    [provider]  montelukast (SINGULAIR) 10 MG tablet Take 10 mg by mouth at bedtime. Reported on 12/12/2015    [provider]  tamsulosin (FLOMAX) 0.4 MG CAPS capsule Take 0.4 mg by mouth daily after supper.    [provider]  vitamin B-12 (CYANOCOBALAMIN) 1000 MCG tablet Take 1,000 mcg by mouth daily.    [provider]     Allergies Codeine, Penicillins, and Sulfa antibiotics  Family History  Problem Relation Age of Onset  . Breast cancer Maternal Aunt   . Heart disease Mother   . ALS Father   . Heart disease Father   . Lymphoma Brother     Social History Social History   Tobacco Use  . Smoking status: Former Smoker    Packs/day: 1.50    Years: 30.00    Pack years: 45.00    Types: Cigarettes    Quit date: 10/06/2014    Years since quitting: 4.7  . Smokeless tobacco: Never Used  Substance Use Topics  . Alcohol use: No  . Drug use: No    Review of Systems  Constitutional: No fever/chills. Eyes: No redness. ENT: No sore throat. Cardiovascular: Denies chest pain. Respiratory: Denies shortness of breath. Gastrointestinal: No vomiting or diarrhea.  Genitourinary: Negative for dysuria.  Musculoskeletal: Negative for back pain. Skin: Negative for rash. Neurological: Negative for headache.   ____________________________________________   PHYSICAL EXAM:  VITAL SIGNS: ED Triage Vitals  Enc Vitals Group     BP 07/05/19 1447 (!) 105/47     Pulse Rate 07/05/19 1447 (!) 56     Resp 07/05/19 1447 18     Temp 07/05/19 1447 98.7 F (37.1 C)     Temp Source 07/05/19 1447 Oral     SpO2 07/05/19 1447 95 %     Weight 07/05/19 1447 145 lb (65.8 kg)     Height 07/05/19 1447 5\' 7"  (1.702 m)     Head Circumference --      Peak Flow --      Pain Score 07/05/19 1500 0     Pain Loc --      Pain Edu? --      Excl. in [place]? --     Constitutional: Alert and oriented. Well appearing and in no acute distress. Eyes: Conjunctivae are normal.  EOMI. Head: Atraumatic. Nose: No congestion/rhinnorhea. Mouth/Throat: Mucous membranes are moist.   Neck: Normal range of motion.  Cardiovascular: Normal rate, regular rhythm.  Good peripheral circulation. Respiratory: Normal respiratory effort.  No retractions. Gastrointestinal: Soft and nontender. No distention.  Genitourinary: No flank  tenderness. Musculoskeletal: Extremities warm and well perfused.  Neurologic:  Normal speech and language.  Motor and sensory intact in all extremities.  Normal coordination.   Skin:  Skin is warm and dry. No rash noted. Psychiatric: Mood and affect are normal. Speech and behavior are normal.  ____________________________________________   LABS (all labs ordered are listed, but only abnormal results are displayed)  Labs Reviewed  BASIC METABOLIC PANEL - Abnormal; Notable for the following components:      Result Value   Creatinine, Ser 1.34 (*)    GFR calc non Af Amer 37 (*)    GFR calc Af Amer 43 (*)    All  other components within normal limits  CBC WITH DIFFERENTIAL/PLATELET - Abnormal; Notable for the following components:   RBC 3.58 (*)    Hemoglobin 11.2 (*)    HCT 35.8 (*)    All other components within normal limits  URINALYSIS, COMPLETE (UACMP) WITH MICROSCOPIC - Abnormal; Notable for the following components:   Color, Urine STRAW (*)    APPearance CLEAR (*)    Specific Gravity, Urine 1.003 (*)    Leukocytes,Ua SMALL (*)    Bacteria, UA MANY (*)    All other components within normal limits  LACTIC ACID, PLASMA  LACTIC ACID, PLASMA   ____________________________________________  EKG  ED ECG REPORT I, Dionne Bucy, the attending physician, personally viewed and interpreted this ECG.  Date: 07/05/2019 EKG Time: 1600 Rate: 52 Rhythm: normal sinus rhythm QRS Axis: normal Intervals: normal ST/T Wave abnormalities: normal Narrative Interpretation: no evidence of acute ischemia  ____________________________________________  RADIOLOGY    ____________________________________________   PROCEDURES  Procedure(s) performed: No  Procedures  Critical Care performed: No ____________________________________________   INITIAL IMPRESSION / ASSESSMENT AND PLAN / ED COURSE  Pertinent labs & imaging results that were available during my care of the patient  were reviewed by me and considered in my medical decision making (see chart for details).  84 year old female with PMH as noted above was sent to the ED from the [facility] clinic after she was noted to be hypotensive to the 70s/50s with a borderline low O2 saturation.  Patient states that she has had some mild lightheadedness recently, but denies any focal symptoms.  She denies any increase shortness of breath.  Review of systems is otherwise negative.  I reviewed the past medical records in epic.  The patient has no recent ED visits or admissions over the last 2 years.  On exam she is overall well-appearing for her age.  Her blood pressure is now normal, as are her other vital signs.  The physical exam is unremarkable.  Differential includes vasovagal type near syncope, dehydration/hypovolemia, or less likely infection/sepsis given that the hypotension is now resolved.  There is no clinical evidence for cardiac etiology.  We will obtain labs including a lactic acid and UA, EKG, and monitor the patient for a few hours.  ----------------------------------------- 7:09 PM on 07/05/2019 -----------------------------------------  The patient has remained asymptomatic and with a normal range blood pressure for the last several hours.  The lab work-up is unremarkable except for mild anemia the lactic acid is normal.  Urinalysis does show significant WBCs and many bacteria, concerning for possible UTI.  At this time, the patient has no evidence of systemic infection or sepsis.  She is stable for discharge.  She feels well and would like to go home.  I will prescribe Keflex.  I gave her thorough return precautions and she expressed understanding.  ____________________________________________   FINAL CLINICAL IMPRESSION(S) / ED DIAGNOSES  Final diagnoses:  Hypotension, unspecified hypotension type  Urinary tract infection without hematuria, site unspecified      NEW MEDICATIONS STARTED DURING THIS  VISIT:  New Prescriptions   CEPHALEXIN (KEFLEX) 500 MG CAPSULE    Take 1 capsule (500 mg total) by mouth 2 (two) times daily for 7 days.     Note:  This document was prepared using Dragon voice recognition software and may include unintentional dictation errors.    Dionne Bucy, MD 07/05/19 1910

## 2019-07-05 NOTE — ED Notes (Signed)
[place] sent to the lab.  

## 2019-07-12 DIAGNOSIS — J42 Unspecified chronic bronchitis: Secondary | ICD-10-CM | POA: Diagnosis not present

## 2019-07-27 ENCOUNTER — Ambulatory Visit (INDEPENDENT_AMBULATORY_CARE_PROVIDER_SITE_OTHER): Payer: Medicare HMO | Admitting: Vascular Surgery

## 2019-07-31 DIAGNOSIS — J449 Chronic obstructive pulmonary disease, unspecified: Secondary | ICD-10-CM | POA: Diagnosis not present

## 2019-08-14 DIAGNOSIS — J9620 Acute and chronic respiratory failure, unspecified whether with hypoxia or hypercapnia: Secondary | ICD-10-CM | POA: Diagnosis not present

## 2019-08-14 DIAGNOSIS — J9610 Chronic respiratory failure, unspecified whether with hypoxia or hypercapnia: Secondary | ICD-10-CM | POA: Diagnosis not present

## 2019-08-14 DIAGNOSIS — R0902 Hypoxemia: Secondary | ICD-10-CM | POA: Diagnosis not present

## 2019-08-14 DIAGNOSIS — J449 Chronic obstructive pulmonary disease, unspecified: Secondary | ICD-10-CM | POA: Diagnosis not present

## 2019-08-14 DIAGNOSIS — I10 Essential (primary) hypertension: Secondary | ICD-10-CM | POA: Diagnosis not present

## 2019-08-14 DIAGNOSIS — I82409 Acute embolism and thrombosis of unspecified deep veins of unspecified lower extremity: Secondary | ICD-10-CM | POA: Diagnosis not present

## 2019-08-25 DIAGNOSIS — I712 Thoracic aortic aneurysm, without rupture: Secondary | ICD-10-CM | POA: Diagnosis not present

## 2019-08-25 DIAGNOSIS — I10 Essential (primary) hypertension: Secondary | ICD-10-CM | POA: Diagnosis not present

## 2019-08-25 DIAGNOSIS — D649 Anemia, unspecified: Secondary | ICD-10-CM | POA: Diagnosis not present

## 2019-08-25 DIAGNOSIS — M47816 Spondylosis without myelopathy or radiculopathy, lumbar region: Secondary | ICD-10-CM | POA: Diagnosis not present

## 2019-08-25 DIAGNOSIS — J432 Centrilobular emphysema: Secondary | ICD-10-CM | POA: Diagnosis not present

## 2019-08-25 DIAGNOSIS — J9611 Chronic respiratory failure with hypoxia: Secondary | ICD-10-CM | POA: Diagnosis not present

## 2019-08-25 DIAGNOSIS — I739 Peripheral vascular disease, unspecified: Secondary | ICD-10-CM | POA: Diagnosis not present

## 2019-08-25 DIAGNOSIS — M545 Low back pain: Secondary | ICD-10-CM | POA: Diagnosis not present

## 2019-08-25 DIAGNOSIS — B0223 Postherpetic polyneuropathy: Secondary | ICD-10-CM | POA: Diagnosis not present

## 2019-08-28 DIAGNOSIS — J449 Chronic obstructive pulmonary disease, unspecified: Secondary | ICD-10-CM | POA: Diagnosis not present

## 2019-09-01 ENCOUNTER — Other Ambulatory Visit: Payer: Self-pay | Admitting: Internal Medicine

## 2019-09-01 DIAGNOSIS — Z1321 Encounter for screening for nutritional disorder: Secondary | ICD-10-CM | POA: Diagnosis not present

## 2019-09-01 DIAGNOSIS — I10 Essential (primary) hypertension: Secondary | ICD-10-CM | POA: Diagnosis not present

## 2019-09-01 DIAGNOSIS — M5136 Other intervertebral disc degeneration, lumbar region: Secondary | ICD-10-CM | POA: Diagnosis not present

## 2019-09-01 DIAGNOSIS — Z9981 Dependence on supplemental oxygen: Secondary | ICD-10-CM | POA: Diagnosis not present

## 2019-09-01 DIAGNOSIS — D649 Anemia, unspecified: Secondary | ICD-10-CM | POA: Diagnosis not present

## 2019-09-01 DIAGNOSIS — Z1231 Encounter for screening mammogram for malignant neoplasm of breast: Secondary | ICD-10-CM

## 2019-09-01 DIAGNOSIS — I739 Peripheral vascular disease, unspecified: Secondary | ICD-10-CM | POA: Diagnosis not present

## 2019-09-01 DIAGNOSIS — Z1211 Encounter for screening for malignant neoplasm of colon: Secondary | ICD-10-CM | POA: Diagnosis not present

## 2019-09-01 DIAGNOSIS — Z0000 Encounter for general adult medical examination without abnormal findings: Secondary | ICD-10-CM | POA: Diagnosis not present

## 2019-09-01 DIAGNOSIS — F419 Anxiety disorder, unspecified: Secondary | ICD-10-CM | POA: Diagnosis not present

## 2019-09-12 DIAGNOSIS — I10 Essential (primary) hypertension: Secondary | ICD-10-CM | POA: Diagnosis not present

## 2019-09-12 DIAGNOSIS — M5136 Other intervertebral disc degeneration, lumbar region: Secondary | ICD-10-CM | POA: Diagnosis not present

## 2019-09-12 DIAGNOSIS — D649 Anemia, unspecified: Secondary | ICD-10-CM | POA: Diagnosis not present

## 2019-09-12 DIAGNOSIS — F419 Anxiety disorder, unspecified: Secondary | ICD-10-CM | POA: Diagnosis not present

## 2019-09-12 DIAGNOSIS — Z1211 Encounter for screening for malignant neoplasm of colon: Secondary | ICD-10-CM | POA: Diagnosis not present

## 2019-09-12 DIAGNOSIS — Z0000 Encounter for general adult medical examination without abnormal findings: Secondary | ICD-10-CM | POA: Diagnosis not present

## 2019-09-12 DIAGNOSIS — Z9981 Dependence on supplemental oxygen: Secondary | ICD-10-CM | POA: Diagnosis not present

## 2019-09-12 DIAGNOSIS — I739 Peripheral vascular disease, unspecified: Secondary | ICD-10-CM | POA: Diagnosis not present

## 2019-09-12 DIAGNOSIS — Z1321 Encounter for screening for nutritional disorder: Secondary | ICD-10-CM | POA: Diagnosis not present

## 2019-09-13 ENCOUNTER — Ambulatory Visit
Admission: RE | Admit: 2019-09-13 | Discharge: 2019-09-13 | Disposition: A | Discharge status: Home / Self Care | Payer: Medicare HMO | Source: Ambulatory Visit | Attending: Internal Medicine | Admitting: Internal Medicine

## 2019-09-13 DIAGNOSIS — Z1231 Encounter for screening mammogram for malignant neoplasm of breast: Secondary | ICD-10-CM | POA: Diagnosis not present

## 2019-09-14 DIAGNOSIS — I82409 Acute embolism and thrombosis of unspecified deep veins of unspecified lower extremity: Secondary | ICD-10-CM | POA: Diagnosis not present

## 2019-09-14 DIAGNOSIS — J9610 Chronic respiratory failure, unspecified whether with hypoxia or hypercapnia: Secondary | ICD-10-CM | POA: Diagnosis not present

## 2019-09-14 DIAGNOSIS — J449 Chronic obstructive pulmonary disease, unspecified: Secondary | ICD-10-CM | POA: Diagnosis not present

## 2019-09-14 DIAGNOSIS — I10 Essential (primary) hypertension: Secondary | ICD-10-CM | POA: Diagnosis not present

## 2019-09-14 DIAGNOSIS — R0902 Hypoxemia: Secondary | ICD-10-CM | POA: Diagnosis not present

## 2019-09-14 DIAGNOSIS — J9620 Acute and chronic respiratory failure, unspecified whether with hypoxia or hypercapnia: Secondary | ICD-10-CM | POA: Diagnosis not present

## 2019-09-28 DIAGNOSIS — J449 Chronic obstructive pulmonary disease, unspecified: Secondary | ICD-10-CM | POA: Diagnosis not present

## 2019-10-06 DIAGNOSIS — M5136 Other intervertebral disc degeneration, lumbar region: Secondary | ICD-10-CM | POA: Diagnosis not present

## 2019-10-06 DIAGNOSIS — M5416 Radiculopathy, lumbar region: Secondary | ICD-10-CM | POA: Diagnosis not present

## 2019-10-11 DIAGNOSIS — M5416 Radiculopathy, lumbar region: Secondary | ICD-10-CM | POA: Diagnosis not present

## 2019-10-11 DIAGNOSIS — M5136 Other intervertebral disc degeneration, lumbar region: Secondary | ICD-10-CM | POA: Diagnosis not present

## 2019-10-14 DIAGNOSIS — I10 Essential (primary) hypertension: Secondary | ICD-10-CM | POA: Diagnosis not present

## 2019-10-14 DIAGNOSIS — R0902 Hypoxemia: Secondary | ICD-10-CM | POA: Diagnosis not present

## 2019-10-14 DIAGNOSIS — I82409 Acute embolism and thrombosis of unspecified deep veins of unspecified lower extremity: Secondary | ICD-10-CM | POA: Diagnosis not present

## 2019-10-14 DIAGNOSIS — J449 Chronic obstructive pulmonary disease, unspecified: Secondary | ICD-10-CM | POA: Diagnosis not present

## 2019-10-14 DIAGNOSIS — J9620 Acute and chronic respiratory failure, unspecified whether with hypoxia or hypercapnia: Secondary | ICD-10-CM | POA: Diagnosis not present

## 2019-10-14 DIAGNOSIS — J9610 Chronic respiratory failure, unspecified whether with hypoxia or hypercapnia: Secondary | ICD-10-CM | POA: Diagnosis not present

## 2019-10-28 DIAGNOSIS — J449 Chronic obstructive pulmonary disease, unspecified: Secondary | ICD-10-CM | POA: Diagnosis not present

## 2019-11-14 DIAGNOSIS — I82409 Acute embolism and thrombosis of unspecified deep veins of unspecified lower extremity: Secondary | ICD-10-CM | POA: Diagnosis not present

## 2019-11-14 DIAGNOSIS — J449 Chronic obstructive pulmonary disease, unspecified: Secondary | ICD-10-CM | POA: Diagnosis not present

## 2019-11-14 DIAGNOSIS — R0902 Hypoxemia: Secondary | ICD-10-CM | POA: Diagnosis not present

## 2019-11-14 DIAGNOSIS — J9620 Acute and chronic respiratory failure, unspecified whether with hypoxia or hypercapnia: Secondary | ICD-10-CM | POA: Diagnosis not present

## 2019-11-14 DIAGNOSIS — I10 Essential (primary) hypertension: Secondary | ICD-10-CM | POA: Diagnosis not present

## 2019-11-14 DIAGNOSIS — J9610 Chronic respiratory failure, unspecified whether with hypoxia or hypercapnia: Secondary | ICD-10-CM | POA: Diagnosis not present

## 2019-12-14 DIAGNOSIS — J9610 Chronic respiratory failure, unspecified whether with hypoxia or hypercapnia: Secondary | ICD-10-CM | POA: Diagnosis not present

## 2019-12-14 DIAGNOSIS — R0902 Hypoxemia: Secondary | ICD-10-CM | POA: Diagnosis not present

## 2019-12-14 DIAGNOSIS — J9620 Acute and chronic respiratory failure, unspecified whether with hypoxia or hypercapnia: Secondary | ICD-10-CM | POA: Diagnosis not present

## 2019-12-14 DIAGNOSIS — I82409 Acute embolism and thrombosis of unspecified deep veins of unspecified lower extremity: Secondary | ICD-10-CM | POA: Diagnosis not present

## 2019-12-14 DIAGNOSIS — J449 Chronic obstructive pulmonary disease, unspecified: Secondary | ICD-10-CM | POA: Diagnosis not present

## 2019-12-14 DIAGNOSIS — I10 Essential (primary) hypertension: Secondary | ICD-10-CM | POA: Diagnosis not present

## 2020-01-03 ENCOUNTER — Other Ambulatory Visit (HOSPITAL_COMMUNITY): Payer: Self-pay | Admitting: Internal Medicine

## 2020-01-03 ENCOUNTER — Other Ambulatory Visit: Payer: Self-pay | Admitting: Internal Medicine

## 2020-01-03 DIAGNOSIS — I6523 Occlusion and stenosis of bilateral carotid arteries: Secondary | ICD-10-CM

## 2020-01-03 DIAGNOSIS — I10 Essential (primary) hypertension: Secondary | ICD-10-CM | POA: Diagnosis not present

## 2020-01-03 DIAGNOSIS — J432 Centrilobular emphysema: Secondary | ICD-10-CM | POA: Diagnosis not present

## 2020-01-03 DIAGNOSIS — E7800 Pure hypercholesterolemia, unspecified: Secondary | ICD-10-CM | POA: Diagnosis not present

## 2020-01-03 DIAGNOSIS — I2510 Atherosclerotic heart disease of native coronary artery without angina pectoris: Secondary | ICD-10-CM | POA: Diagnosis not present

## 2020-01-03 DIAGNOSIS — I712 Thoracic aortic aneurysm, without rupture: Secondary | ICD-10-CM | POA: Diagnosis not present

## 2020-01-03 DIAGNOSIS — I7121 Aneurysm of the ascending aorta, without rupture: Secondary | ICD-10-CM

## 2020-01-14 DIAGNOSIS — R0902 Hypoxemia: Secondary | ICD-10-CM | POA: Diagnosis not present

## 2020-01-14 DIAGNOSIS — J9610 Chronic respiratory failure, unspecified whether with hypoxia or hypercapnia: Secondary | ICD-10-CM | POA: Diagnosis not present

## 2020-01-14 DIAGNOSIS — I82409 Acute embolism and thrombosis of unspecified deep veins of unspecified lower extremity: Secondary | ICD-10-CM | POA: Diagnosis not present

## 2020-01-14 DIAGNOSIS — J9620 Acute and chronic respiratory failure, unspecified whether with hypoxia or hypercapnia: Secondary | ICD-10-CM | POA: Diagnosis not present

## 2020-01-14 DIAGNOSIS — J449 Chronic obstructive pulmonary disease, unspecified: Secondary | ICD-10-CM | POA: Diagnosis not present

## 2020-01-14 DIAGNOSIS — I10 Essential (primary) hypertension: Secondary | ICD-10-CM | POA: Diagnosis not present

## 2020-01-15 ENCOUNTER — Ambulatory Visit
Admission: RE | Admit: 2020-01-15 | Discharge: 2020-01-15 | Disposition: A | Discharge status: Home / Self Care | Payer: Medicare HMO | Source: Ambulatory Visit | Attending: Internal Medicine | Admitting: Internal Medicine

## 2020-01-15 ENCOUNTER — Other Ambulatory Visit: Payer: Self-pay

## 2020-01-15 DIAGNOSIS — I6523 Occlusion and stenosis of bilateral carotid arteries: Secondary | ICD-10-CM | POA: Diagnosis not present

## 2020-01-15 DIAGNOSIS — I6503 Occlusion and stenosis of bilateral vertebral arteries: Secondary | ICD-10-CM | POA: Diagnosis not present

## 2020-01-15 DIAGNOSIS — J9611 Chronic respiratory failure with hypoxia: Secondary | ICD-10-CM | POA: Diagnosis not present

## 2020-01-15 DIAGNOSIS — J432 Centrilobular emphysema: Secondary | ICD-10-CM | POA: Diagnosis not present

## 2020-01-15 DIAGNOSIS — I739 Peripheral vascular disease, unspecified: Secondary | ICD-10-CM | POA: Diagnosis not present

## 2020-01-15 DIAGNOSIS — I6603 Occlusion and stenosis of bilateral middle cerebral arteries: Secondary | ICD-10-CM | POA: Diagnosis not present

## 2020-01-15 DIAGNOSIS — I712 Thoracic aortic aneurysm, without rupture: Secondary | ICD-10-CM | POA: Diagnosis not present

## 2020-01-15 DIAGNOSIS — I2510 Atherosclerotic heart disease of native coronary artery without angina pectoris: Secondary | ICD-10-CM | POA: Diagnosis not present

## 2020-01-15 DIAGNOSIS — I7121 Aneurysm of the ascending aorta, without rupture: Secondary | ICD-10-CM

## 2020-01-15 DIAGNOSIS — M5136 Other intervertebral disc degeneration, lumbar region: Secondary | ICD-10-CM | POA: Diagnosis not present

## 2020-01-15 DIAGNOSIS — I959 Hypotension, unspecified: Secondary | ICD-10-CM | POA: Diagnosis not present

## 2020-01-15 DIAGNOSIS — I672 Cerebral atherosclerosis: Secondary | ICD-10-CM | POA: Diagnosis not present

## 2020-01-15 DIAGNOSIS — M5416 Radiculopathy, lumbar region: Secondary | ICD-10-CM | POA: Diagnosis not present

## 2020-01-15 DIAGNOSIS — I10 Essential (primary) hypertension: Secondary | ICD-10-CM | POA: Diagnosis not present

## 2020-01-15 DIAGNOSIS — M1711 Unilateral primary osteoarthritis, right knee: Secondary | ICD-10-CM | POA: Diagnosis not present

## 2020-01-15 DIAGNOSIS — J449 Chronic obstructive pulmonary disease, unspecified: Secondary | ICD-10-CM | POA: Diagnosis not present

## 2020-01-15 DIAGNOSIS — I708 Atherosclerosis of other arteries: Secondary | ICD-10-CM | POA: Diagnosis not present

## 2020-01-15 MED ORDER — IOHEXOL 350 MG/ML SOLN
60.0000 mL | Freq: Once | INTRAVENOUS | Status: AC | PRN
  Administered 2020-01-15: 60 mL via INTRAVENOUS

## 2020-01-15 MED ORDER — IOHEXOL 350 MG/ML SOLN: 75.0000 mL | Freq: Once | INTRAVENOUS | Status: DC | PRN

## 2020-01-16 LAB — POCT I-STAT CREATININE: Creatinine, Ser: 1.2 mg/dL — ABNORMAL HIGH (ref 0.44–1.00)

## 2020-01-17 DIAGNOSIS — M5136 Other intervertebral disc degeneration, lumbar region: Secondary | ICD-10-CM | POA: Diagnosis not present

## 2020-01-17 DIAGNOSIS — J9611 Chronic respiratory failure with hypoxia: Secondary | ICD-10-CM | POA: Diagnosis not present

## 2020-01-17 DIAGNOSIS — I10 Essential (primary) hypertension: Secondary | ICD-10-CM | POA: Diagnosis not present

## 2020-01-17 DIAGNOSIS — I2510 Atherosclerotic heart disease of native coronary artery without angina pectoris: Secondary | ICD-10-CM | POA: Diagnosis not present

## 2020-01-17 DIAGNOSIS — D649 Anemia, unspecified: Secondary | ICD-10-CM | POA: Diagnosis not present

## 2020-01-17 DIAGNOSIS — I700 Atherosclerosis of aorta: Secondary | ICD-10-CM | POA: Diagnosis not present

## 2020-01-17 DIAGNOSIS — Z79899 Other long term (current) drug therapy: Secondary | ICD-10-CM | POA: Diagnosis not present

## 2020-01-17 DIAGNOSIS — J449 Chronic obstructive pulmonary disease, unspecified: Secondary | ICD-10-CM | POA: Diagnosis not present

## 2020-01-17 DIAGNOSIS — I739 Peripheral vascular disease, unspecified: Secondary | ICD-10-CM | POA: Diagnosis not present

## 2020-01-25 DIAGNOSIS — I700 Atherosclerosis of aorta: Secondary | ICD-10-CM | POA: Diagnosis not present

## 2020-01-25 DIAGNOSIS — I6529 Occlusion and stenosis of unspecified carotid artery: Secondary | ICD-10-CM | POA: Diagnosis not present

## 2020-01-25 DIAGNOSIS — G8929 Other chronic pain: Secondary | ICD-10-CM | POA: Diagnosis not present

## 2020-01-25 DIAGNOSIS — D649 Anemia, unspecified: Secondary | ICD-10-CM | POA: Diagnosis not present

## 2020-01-25 DIAGNOSIS — M545 Low back pain: Secondary | ICD-10-CM | POA: Diagnosis not present

## 2020-01-25 DIAGNOSIS — Z79899 Other long term (current) drug therapy: Secondary | ICD-10-CM | POA: Diagnosis not present

## 2020-01-25 DIAGNOSIS — I739 Peripheral vascular disease, unspecified: Secondary | ICD-10-CM | POA: Diagnosis not present

## 2020-01-25 DIAGNOSIS — I10 Essential (primary) hypertension: Secondary | ICD-10-CM | POA: Diagnosis not present

## 2020-01-25 DIAGNOSIS — J439 Emphysema, unspecified: Secondary | ICD-10-CM | POA: Diagnosis not present

## 2020-01-31 DIAGNOSIS — I739 Peripheral vascular disease, unspecified: Secondary | ICD-10-CM | POA: Diagnosis not present

## 2020-01-31 DIAGNOSIS — I712 Thoracic aortic aneurysm, without rupture: Secondary | ICD-10-CM | POA: Diagnosis not present

## 2020-01-31 DIAGNOSIS — I6523 Occlusion and stenosis of bilateral carotid arteries: Secondary | ICD-10-CM | POA: Diagnosis not present

## 2020-01-31 DIAGNOSIS — I10 Essential (primary) hypertension: Secondary | ICD-10-CM | POA: Diagnosis not present

## 2020-01-31 DIAGNOSIS — I700 Atherosclerosis of aorta: Secondary | ICD-10-CM | POA: Diagnosis not present

## 2020-01-31 DIAGNOSIS — I2510 Atherosclerotic heart disease of native coronary artery without angina pectoris: Secondary | ICD-10-CM | POA: Diagnosis not present

## 2020-02-01 DIAGNOSIS — I10 Essential (primary) hypertension: Secondary | ICD-10-CM | POA: Diagnosis not present

## 2020-02-01 DIAGNOSIS — K219 Gastro-esophageal reflux disease without esophagitis: Secondary | ICD-10-CM | POA: Diagnosis not present

## 2020-02-01 DIAGNOSIS — M5116 Intervertebral disc disorders with radiculopathy, lumbar region: Secondary | ICD-10-CM | POA: Diagnosis not present

## 2020-02-01 DIAGNOSIS — G8929 Other chronic pain: Secondary | ICD-10-CM | POA: Diagnosis not present

## 2020-02-01 DIAGNOSIS — I2510 Atherosclerotic heart disease of native coronary artery without angina pectoris: Secondary | ICD-10-CM | POA: Diagnosis not present

## 2020-02-01 DIAGNOSIS — M479 Spondylosis, unspecified: Secondary | ICD-10-CM | POA: Diagnosis not present

## 2020-02-01 DIAGNOSIS — M170 Bilateral primary osteoarthritis of knee: Secondary | ICD-10-CM | POA: Diagnosis not present

## 2020-02-01 DIAGNOSIS — E785 Hyperlipidemia, unspecified: Secondary | ICD-10-CM | POA: Diagnosis not present

## 2020-02-01 DIAGNOSIS — J449 Chronic obstructive pulmonary disease, unspecified: Secondary | ICD-10-CM | POA: Diagnosis not present

## 2020-02-07 DIAGNOSIS — M170 Bilateral primary osteoarthritis of knee: Secondary | ICD-10-CM | POA: Diagnosis not present

## 2020-02-07 DIAGNOSIS — M479 Spondylosis, unspecified: Secondary | ICD-10-CM | POA: Diagnosis not present

## 2020-02-07 DIAGNOSIS — I10 Essential (primary) hypertension: Secondary | ICD-10-CM | POA: Diagnosis not present

## 2020-02-07 DIAGNOSIS — G8929 Other chronic pain: Secondary | ICD-10-CM | POA: Diagnosis not present

## 2020-02-07 DIAGNOSIS — M5116 Intervertebral disc disorders with radiculopathy, lumbar region: Secondary | ICD-10-CM | POA: Diagnosis not present

## 2020-02-07 DIAGNOSIS — I2510 Atherosclerotic heart disease of native coronary artery without angina pectoris: Secondary | ICD-10-CM | POA: Diagnosis not present

## 2020-02-07 DIAGNOSIS — J449 Chronic obstructive pulmonary disease, unspecified: Secondary | ICD-10-CM | POA: Diagnosis not present

## 2020-02-07 DIAGNOSIS — K219 Gastro-esophageal reflux disease without esophagitis: Secondary | ICD-10-CM | POA: Diagnosis not present

## 2020-02-07 DIAGNOSIS — E785 Hyperlipidemia, unspecified: Secondary | ICD-10-CM | POA: Diagnosis not present

## 2020-02-13 DIAGNOSIS — M5116 Intervertebral disc disorders with radiculopathy, lumbar region: Secondary | ICD-10-CM | POA: Diagnosis not present

## 2020-02-13 DIAGNOSIS — M479 Spondylosis, unspecified: Secondary | ICD-10-CM | POA: Diagnosis not present

## 2020-02-13 DIAGNOSIS — E785 Hyperlipidemia, unspecified: Secondary | ICD-10-CM | POA: Diagnosis not present

## 2020-02-13 DIAGNOSIS — I10 Essential (primary) hypertension: Secondary | ICD-10-CM | POA: Diagnosis not present

## 2020-02-13 DIAGNOSIS — K219 Gastro-esophageal reflux disease without esophagitis: Secondary | ICD-10-CM | POA: Diagnosis not present

## 2020-02-13 DIAGNOSIS — J449 Chronic obstructive pulmonary disease, unspecified: Secondary | ICD-10-CM | POA: Diagnosis not present

## 2020-02-13 DIAGNOSIS — G8929 Other chronic pain: Secondary | ICD-10-CM | POA: Diagnosis not present

## 2020-02-13 DIAGNOSIS — I2510 Atherosclerotic heart disease of native coronary artery without angina pectoris: Secondary | ICD-10-CM | POA: Diagnosis not present

## 2020-02-13 DIAGNOSIS — M170 Bilateral primary osteoarthritis of knee: Secondary | ICD-10-CM | POA: Diagnosis not present

## 2020-02-21 DIAGNOSIS — M5116 Intervertebral disc disorders with radiculopathy, lumbar region: Secondary | ICD-10-CM | POA: Diagnosis not present

## 2020-02-21 DIAGNOSIS — I2510 Atherosclerotic heart disease of native coronary artery without angina pectoris: Secondary | ICD-10-CM | POA: Diagnosis not present

## 2020-02-21 DIAGNOSIS — J449 Chronic obstructive pulmonary disease, unspecified: Secondary | ICD-10-CM | POA: Diagnosis not present

## 2020-02-21 DIAGNOSIS — M170 Bilateral primary osteoarthritis of knee: Secondary | ICD-10-CM | POA: Diagnosis not present

## 2020-02-21 DIAGNOSIS — M479 Spondylosis, unspecified: Secondary | ICD-10-CM | POA: Diagnosis not present

## 2020-02-21 DIAGNOSIS — I10 Essential (primary) hypertension: Secondary | ICD-10-CM | POA: Diagnosis not present

## 2020-02-21 DIAGNOSIS — E785 Hyperlipidemia, unspecified: Secondary | ICD-10-CM | POA: Diagnosis not present

## 2020-02-21 DIAGNOSIS — K219 Gastro-esophageal reflux disease without esophagitis: Secondary | ICD-10-CM | POA: Diagnosis not present

## 2020-02-21 DIAGNOSIS — G8929 Other chronic pain: Secondary | ICD-10-CM | POA: Diagnosis not present

## 2020-02-22 DIAGNOSIS — M5136 Other intervertebral disc degeneration, lumbar region: Secondary | ICD-10-CM | POA: Diagnosis not present

## 2020-02-22 DIAGNOSIS — M5416 Radiculopathy, lumbar region: Secondary | ICD-10-CM | POA: Diagnosis not present

## 2020-02-22 DIAGNOSIS — M1711 Unilateral primary osteoarthritis, right knee: Secondary | ICD-10-CM | POA: Diagnosis not present

## 2020-02-23 DIAGNOSIS — I10 Essential (primary) hypertension: Secondary | ICD-10-CM | POA: Diagnosis not present

## 2020-02-23 DIAGNOSIS — M5136 Other intervertebral disc degeneration, lumbar region: Secondary | ICD-10-CM | POA: Diagnosis not present

## 2020-02-23 DIAGNOSIS — M5416 Radiculopathy, lumbar region: Secondary | ICD-10-CM | POA: Diagnosis not present

## 2020-02-26 ENCOUNTER — Encounter (INDEPENDENT_AMBULATORY_CARE_PROVIDER_SITE_OTHER): Payer: Medicare HMO | Admitting: Vascular Surgery

## 2020-02-28 DIAGNOSIS — M5116 Intervertebral disc disorders with radiculopathy, lumbar region: Secondary | ICD-10-CM | POA: Diagnosis not present

## 2020-02-28 DIAGNOSIS — E785 Hyperlipidemia, unspecified: Secondary | ICD-10-CM | POA: Diagnosis not present

## 2020-02-28 DIAGNOSIS — M170 Bilateral primary osteoarthritis of knee: Secondary | ICD-10-CM | POA: Diagnosis not present

## 2020-02-28 DIAGNOSIS — K219 Gastro-esophageal reflux disease without esophagitis: Secondary | ICD-10-CM | POA: Diagnosis not present

## 2020-02-28 DIAGNOSIS — I10 Essential (primary) hypertension: Secondary | ICD-10-CM | POA: Diagnosis not present

## 2020-02-28 DIAGNOSIS — G8929 Other chronic pain: Secondary | ICD-10-CM | POA: Diagnosis not present

## 2020-02-28 DIAGNOSIS — I2510 Atherosclerotic heart disease of native coronary artery without angina pectoris: Secondary | ICD-10-CM | POA: Diagnosis not present

## 2020-02-28 DIAGNOSIS — M479 Spondylosis, unspecified: Secondary | ICD-10-CM | POA: Diagnosis not present

## 2020-02-28 DIAGNOSIS — J449 Chronic obstructive pulmonary disease, unspecified: Secondary | ICD-10-CM | POA: Diagnosis not present

## 2020-03-02 DIAGNOSIS — G8929 Other chronic pain: Secondary | ICD-10-CM | POA: Diagnosis not present

## 2020-03-02 DIAGNOSIS — J449 Chronic obstructive pulmonary disease, unspecified: Secondary | ICD-10-CM | POA: Diagnosis not present

## 2020-03-02 DIAGNOSIS — I2510 Atherosclerotic heart disease of native coronary artery without angina pectoris: Secondary | ICD-10-CM | POA: Diagnosis not present

## 2020-03-02 DIAGNOSIS — M5116 Intervertebral disc disorders with radiculopathy, lumbar region: Secondary | ICD-10-CM | POA: Diagnosis not present

## 2020-03-02 DIAGNOSIS — M170 Bilateral primary osteoarthritis of knee: Secondary | ICD-10-CM | POA: Diagnosis not present

## 2020-03-02 DIAGNOSIS — M479 Spondylosis, unspecified: Secondary | ICD-10-CM | POA: Diagnosis not present

## 2020-03-02 DIAGNOSIS — E785 Hyperlipidemia, unspecified: Secondary | ICD-10-CM | POA: Diagnosis not present

## 2020-03-02 DIAGNOSIS — K219 Gastro-esophageal reflux disease without esophagitis: Secondary | ICD-10-CM | POA: Diagnosis not present

## 2020-03-02 DIAGNOSIS — I10 Essential (primary) hypertension: Secondary | ICD-10-CM | POA: Diagnosis not present

## 2020-03-14 DIAGNOSIS — G8929 Other chronic pain: Secondary | ICD-10-CM | POA: Diagnosis not present

## 2020-03-14 DIAGNOSIS — K219 Gastro-esophageal reflux disease without esophagitis: Secondary | ICD-10-CM | POA: Diagnosis not present

## 2020-03-14 DIAGNOSIS — M5116 Intervertebral disc disorders with radiculopathy, lumbar region: Secondary | ICD-10-CM | POA: Diagnosis not present

## 2020-03-14 DIAGNOSIS — M479 Spondylosis, unspecified: Secondary | ICD-10-CM | POA: Diagnosis not present

## 2020-03-14 DIAGNOSIS — M170 Bilateral primary osteoarthritis of knee: Secondary | ICD-10-CM | POA: Diagnosis not present

## 2020-03-14 DIAGNOSIS — E785 Hyperlipidemia, unspecified: Secondary | ICD-10-CM | POA: Diagnosis not present

## 2020-03-14 DIAGNOSIS — J449 Chronic obstructive pulmonary disease, unspecified: Secondary | ICD-10-CM | POA: Diagnosis not present

## 2020-03-14 DIAGNOSIS — I2510 Atherosclerotic heart disease of native coronary artery without angina pectoris: Secondary | ICD-10-CM | POA: Diagnosis not present

## 2020-03-14 DIAGNOSIS — I10 Essential (primary) hypertension: Secondary | ICD-10-CM | POA: Diagnosis not present

## 2020-03-18 ENCOUNTER — Encounter (INDEPENDENT_AMBULATORY_CARE_PROVIDER_SITE_OTHER): Payer: Medicare HMO | Admitting: Vascular Surgery

## 2020-03-28 DIAGNOSIS — E785 Hyperlipidemia, unspecified: Secondary | ICD-10-CM | POA: Diagnosis not present

## 2020-03-28 DIAGNOSIS — K219 Gastro-esophageal reflux disease without esophagitis: Secondary | ICD-10-CM | POA: Diagnosis not present

## 2020-03-28 DIAGNOSIS — M5116 Intervertebral disc disorders with radiculopathy, lumbar region: Secondary | ICD-10-CM | POA: Diagnosis not present

## 2020-03-28 DIAGNOSIS — M170 Bilateral primary osteoarthritis of knee: Secondary | ICD-10-CM | POA: Diagnosis not present

## 2020-03-28 DIAGNOSIS — G8929 Other chronic pain: Secondary | ICD-10-CM | POA: Diagnosis not present

## 2020-03-28 DIAGNOSIS — I10 Essential (primary) hypertension: Secondary | ICD-10-CM | POA: Diagnosis not present

## 2020-03-28 DIAGNOSIS — I2510 Atherosclerotic heart disease of native coronary artery without angina pectoris: Secondary | ICD-10-CM | POA: Diagnosis not present

## 2020-03-28 DIAGNOSIS — J449 Chronic obstructive pulmonary disease, unspecified: Secondary | ICD-10-CM | POA: Diagnosis not present

## 2020-03-28 DIAGNOSIS — M479 Spondylosis, unspecified: Secondary | ICD-10-CM | POA: Diagnosis not present

## 2020-04-17 DIAGNOSIS — M5136 Other intervertebral disc degeneration, lumbar region: Secondary | ICD-10-CM | POA: Diagnosis not present

## 2020-04-17 DIAGNOSIS — M5416 Radiculopathy, lumbar region: Secondary | ICD-10-CM | POA: Diagnosis not present

## 2020-04-29 DIAGNOSIS — I739 Peripheral vascular disease, unspecified: Secondary | ICD-10-CM | POA: Diagnosis not present

## 2020-04-29 DIAGNOSIS — J449 Chronic obstructive pulmonary disease, unspecified: Secondary | ICD-10-CM | POA: Diagnosis not present

## 2020-04-29 DIAGNOSIS — I700 Atherosclerosis of aorta: Secondary | ICD-10-CM | POA: Diagnosis not present

## 2020-04-29 DIAGNOSIS — I10 Essential (primary) hypertension: Secondary | ICD-10-CM | POA: Diagnosis not present

## 2020-04-29 DIAGNOSIS — I2510 Atherosclerotic heart disease of native coronary artery without angina pectoris: Secondary | ICD-10-CM | POA: Diagnosis not present

## 2020-04-29 DIAGNOSIS — J9611 Chronic respiratory failure with hypoxia: Secondary | ICD-10-CM | POA: Diagnosis not present

## 2020-04-29 DIAGNOSIS — R8290 Unspecified abnormal findings in urine: Secondary | ICD-10-CM | POA: Diagnosis not present

## 2020-04-29 DIAGNOSIS — Z79899 Other long term (current) drug therapy: Secondary | ICD-10-CM | POA: Diagnosis not present

## 2020-04-29 DIAGNOSIS — D649 Anemia, unspecified: Secondary | ICD-10-CM | POA: Diagnosis not present

## 2020-04-29 DIAGNOSIS — M545 Low back pain, unspecified: Secondary | ICD-10-CM | POA: Diagnosis not present

## 2020-05-08 DIAGNOSIS — I6529 Occlusion and stenosis of unspecified carotid artery: Secondary | ICD-10-CM | POA: Diagnosis not present

## 2020-05-08 DIAGNOSIS — J449 Chronic obstructive pulmonary disease, unspecified: Secondary | ICD-10-CM | POA: Diagnosis not present

## 2020-05-08 DIAGNOSIS — Z79899 Other long term (current) drug therapy: Secondary | ICD-10-CM | POA: Diagnosis not present

## 2020-05-08 DIAGNOSIS — I10 Essential (primary) hypertension: Secondary | ICD-10-CM | POA: Diagnosis not present

## 2020-05-08 DIAGNOSIS — I739 Peripheral vascular disease, unspecified: Secondary | ICD-10-CM | POA: Diagnosis not present

## 2020-05-08 DIAGNOSIS — Z23 Encounter for immunization: Secondary | ICD-10-CM | POA: Diagnosis not present

## 2020-05-09 DIAGNOSIS — M5136 Other intervertebral disc degeneration, lumbar region: Secondary | ICD-10-CM | POA: Diagnosis not present

## 2020-05-09 DIAGNOSIS — M5416 Radiculopathy, lumbar region: Secondary | ICD-10-CM | POA: Diagnosis not present

## 2020-05-09 DIAGNOSIS — M48062 Spinal stenosis, lumbar region with neurogenic claudication: Secondary | ICD-10-CM | POA: Diagnosis not present

## 2020-06-04 DIAGNOSIS — I700 Atherosclerosis of aorta: Secondary | ICD-10-CM | POA: Diagnosis not present

## 2020-06-04 DIAGNOSIS — I6523 Occlusion and stenosis of bilateral carotid arteries: Secondary | ICD-10-CM | POA: Diagnosis not present

## 2020-06-04 DIAGNOSIS — I2510 Atherosclerotic heart disease of native coronary artery without angina pectoris: Secondary | ICD-10-CM | POA: Diagnosis not present

## 2020-06-04 DIAGNOSIS — I712 Thoracic aortic aneurysm, without rupture: Secondary | ICD-10-CM | POA: Diagnosis not present

## 2020-06-04 DIAGNOSIS — I10 Essential (primary) hypertension: Secondary | ICD-10-CM | POA: Diagnosis not present

## 2020-06-04 DIAGNOSIS — E7800 Pure hypercholesterolemia, unspecified: Secondary | ICD-10-CM | POA: Diagnosis not present

## 2020-07-17 DIAGNOSIS — J449 Chronic obstructive pulmonary disease, unspecified: Secondary | ICD-10-CM | POA: Diagnosis not present

## 2020-08-13 DIAGNOSIS — M5416 Radiculopathy, lumbar region: Secondary | ICD-10-CM | POA: Diagnosis not present

## 2020-08-13 DIAGNOSIS — M48062 Spinal stenosis, lumbar region with neurogenic claudication: Secondary | ICD-10-CM | POA: Diagnosis not present

## 2020-08-13 DIAGNOSIS — M5136 Other intervertebral disc degeneration, lumbar region: Secondary | ICD-10-CM | POA: Diagnosis not present

## 2020-09-16 ENCOUNTER — Other Ambulatory Visit: Payer: Self-pay

## 2020-09-16 ENCOUNTER — Emergency Department: Payer: Medicare HMO

## 2020-09-16 ENCOUNTER — Inpatient Hospital Stay
Admission: EM | Admit: 2020-09-16 | Discharge: 2020-09-18 | DRG: 193 | Disposition: A | Discharge status: Home / Self Care | Payer: Medicare HMO | Attending: Internal Medicine | Admitting: Internal Medicine

## 2020-09-16 ENCOUNTER — Encounter: Payer: Self-pay | Admitting: Emergency Medicine

## 2020-09-16 DIAGNOSIS — Z6821 Body mass index (BMI) 21.0-21.9, adult: Secondary | ICD-10-CM

## 2020-09-16 DIAGNOSIS — Z87891 Personal history of nicotine dependence: Secondary | ICD-10-CM

## 2020-09-16 DIAGNOSIS — E440 Moderate protein-calorie malnutrition: Secondary | ICD-10-CM | POA: Insufficient documentation

## 2020-09-16 DIAGNOSIS — E785 Hyperlipidemia, unspecified: Secondary | ICD-10-CM | POA: Diagnosis present

## 2020-09-16 DIAGNOSIS — Z90710 Acquired absence of both cervix and uterus: Secondary | ICD-10-CM

## 2020-09-16 DIAGNOSIS — Z79891 Long term (current) use of opiate analgesic: Secondary | ICD-10-CM

## 2020-09-16 DIAGNOSIS — G9341 Metabolic encephalopathy: Secondary | ICD-10-CM | POA: Diagnosis present

## 2020-09-16 DIAGNOSIS — Z66 Do not resuscitate: Secondary | ICD-10-CM | POA: Diagnosis present

## 2020-09-16 DIAGNOSIS — G8929 Other chronic pain: Secondary | ICD-10-CM | POA: Diagnosis not present

## 2020-09-16 DIAGNOSIS — R4182 Altered mental status, unspecified: Secondary | ICD-10-CM | POA: Diagnosis not present

## 2020-09-16 DIAGNOSIS — N179 Acute kidney failure, unspecified: Secondary | ICD-10-CM | POA: Diagnosis present

## 2020-09-16 DIAGNOSIS — Z7951 Long term (current) use of inhaled steroids: Secondary | ICD-10-CM

## 2020-09-16 DIAGNOSIS — J9611 Chronic respiratory failure with hypoxia: Secondary | ICD-10-CM | POA: Diagnosis not present

## 2020-09-16 DIAGNOSIS — Z803 Family history of malignant neoplasm of breast: Secondary | ICD-10-CM | POA: Diagnosis not present

## 2020-09-16 DIAGNOSIS — Z20822 Contact with and (suspected) exposure to covid-19: Secondary | ICD-10-CM | POA: Diagnosis present

## 2020-09-16 DIAGNOSIS — M47816 Spondylosis without myelopathy or radiculopathy, lumbar region: Secondary | ICD-10-CM

## 2020-09-16 DIAGNOSIS — M1990 Unspecified osteoarthritis, unspecified site: Secondary | ICD-10-CM | POA: Diagnosis present

## 2020-09-16 DIAGNOSIS — R627 Adult failure to thrive: Secondary | ICD-10-CM | POA: Diagnosis present

## 2020-09-16 DIAGNOSIS — Z807 Family history of other malignant neoplasms of lymphoid, hematopoietic and related tissues: Secondary | ICD-10-CM | POA: Diagnosis not present

## 2020-09-16 DIAGNOSIS — R54 Age-related physical debility: Secondary | ICD-10-CM | POA: Diagnosis present

## 2020-09-16 DIAGNOSIS — Z8249 Family history of ischemic heart disease and other diseases of the circulatory system: Secondary | ICD-10-CM

## 2020-09-16 DIAGNOSIS — J189 Pneumonia, unspecified organism: Secondary | ICD-10-CM | POA: Diagnosis not present

## 2020-09-16 DIAGNOSIS — Z882 Allergy status to sulfonamides status: Secondary | ICD-10-CM

## 2020-09-16 DIAGNOSIS — I10 Essential (primary) hypertension: Secondary | ICD-10-CM | POA: Diagnosis not present

## 2020-09-16 DIAGNOSIS — E860 Dehydration: Secondary | ICD-10-CM | POA: Diagnosis present

## 2020-09-16 DIAGNOSIS — Z79899 Other long term (current) drug therapy: Secondary | ICD-10-CM

## 2020-09-16 DIAGNOSIS — Z515 Encounter for palliative care: Secondary | ICD-10-CM | POA: Diagnosis not present

## 2020-09-16 DIAGNOSIS — Z7982 Long term (current) use of aspirin: Secondary | ICD-10-CM

## 2020-09-16 DIAGNOSIS — Z7189 Other specified counseling: Secondary | ICD-10-CM | POA: Diagnosis not present

## 2020-09-16 DIAGNOSIS — I959 Hypotension, unspecified: Secondary | ICD-10-CM | POA: Diagnosis present

## 2020-09-16 DIAGNOSIS — Z9981 Dependence on supplemental oxygen: Secondary | ICD-10-CM

## 2020-09-16 DIAGNOSIS — Z885 Allergy status to narcotic agent status: Secondary | ICD-10-CM

## 2020-09-16 DIAGNOSIS — J440 Chronic obstructive pulmonary disease with acute lower respiratory infection: Secondary | ICD-10-CM | POA: Diagnosis present

## 2020-09-16 DIAGNOSIS — Z880 Allergy status to penicillin: Secondary | ICD-10-CM

## 2020-09-16 LAB — BLOOD GAS, ARTERIAL
Acid-base deficit: 2.9 mmol/L — ABNORMAL HIGH (ref 0.0–2.0)
Bicarbonate: 23.2 mmol/L (ref 20.0–28.0)
FIO2: 0.24
O2 Saturation: 91.9 %
Patient temperature: 37
pCO2 arterial: 45 mmHg (ref 32.0–48.0)
pH, Arterial: 7.32 — ABNORMAL LOW (ref 7.350–7.450)
pO2, Arterial: 69 mmHg — ABNORMAL LOW (ref 83.0–108.0)

## 2020-09-16 LAB — LACTIC ACID, PLASMA: Lactic Acid, Venous: 1.7 mmol/L (ref 0.5–1.9)

## 2020-09-16 LAB — BLOOD GAS, VENOUS
Acid-base deficit: 0.8 mmol/L (ref 0.0–2.0)
Bicarbonate: 26.8 mmol/L (ref 20.0–28.0)
O2 Saturation: 72.9 %
Patient temperature: 37
pCO2, Ven: 57 mmHg (ref 44.0–60.0)
pH, Ven: 7.28 (ref 7.250–7.430)
pO2, Ven: 44 mmHg (ref 32.0–45.0)

## 2020-09-16 LAB — COMPREHENSIVE METABOLIC PANEL
ALT: 11 U/L (ref 0–44)
AST: 27 U/L (ref 15–41)
Albumin: 3.9 g/dL (ref 3.5–5.0)
Alkaline Phosphatase: 51 U/L (ref 38–126)
Anion gap: 11 (ref 5–15)
BUN: 40 mg/dL — ABNORMAL HIGH (ref 8–23)
CO2: 23 mmol/L (ref 22–32)
Calcium: 9.4 mg/dL (ref 8.9–10.3)
Chloride: 101 mmol/L (ref 98–111)
Creatinine, Ser: 1.76 mg/dL — ABNORMAL HIGH (ref 0.44–1.00)
GFR, Estimated: 29 mL/min — ABNORMAL LOW (ref >60)
Glucose, Bld: 147 mg/dL — ABNORMAL HIGH (ref 70–99)
Potassium: 4.5 mmol/L (ref 3.5–5.1)
Sodium: 135 mmol/L (ref 135–145)
Total Bilirubin: 1.1 mg/dL (ref 0.3–1.2)
Total Protein: 6.7 g/dL (ref 6.5–8.1)

## 2020-09-16 LAB — CBC
HCT: 36.6 % (ref 36.0–46.0)
Hemoglobin: 12 g/dL (ref 12.0–15.0)
MCH: 31.9 pg (ref 26.0–34.0)
MCHC: 32.8 g/dL (ref 30.0–36.0)
MCV: 97.3 fL (ref 80.0–100.0)
Platelets: 218 10*3/uL (ref 150–400)
RBC: 3.76 MIL/uL — ABNORMAL LOW (ref 3.87–5.11)
RDW: 13.3 % (ref 11.5–15.5)
WBC: 7.8 10*3/uL (ref 4.0–10.5)
nRBC: 0 % (ref 0.0–0.2)

## 2020-09-16 LAB — SAMPLE TO BLOOD BANK

## 2020-09-16 LAB — RESP PANEL BY RT-PCR (FLU A&B, COVID) ARPGX2
Influenza A by PCR: NEGATIVE
Influenza B by PCR: NEGATIVE
SARS Coronavirus 2 by RT PCR: NEGATIVE

## 2020-09-16 LAB — URINE DRUG SCREEN, QUALITATIVE (ARMC ONLY)
Amphetamines, Ur Screen: NOT DETECTED
Barbiturates, Ur Screen: NOT DETECTED
Benzodiazepine, Ur Scrn: NOT DETECTED
Cannabinoid 50 Ng, Ur ~~LOC~~: NOT DETECTED
Cocaine Metabolite,Ur ~~LOC~~: NOT DETECTED
MDMA (Ecstasy)Ur Screen: NOT DETECTED
Methadone Scn, Ur: NOT DETECTED
Opiate, Ur Screen: POSITIVE — AB
Phencyclidine (PCP) Ur S: NOT DETECTED
Tricyclic, Ur Screen: NOT DETECTED

## 2020-09-16 MED ORDER — LORATADINE 10 MG PO TABS
10.0000 mg | ORAL_TABLET | Freq: Every day | ORAL | Status: DC
  Administered 2020-09-17 – 2020-09-18 (×2): 10 mg via ORAL
  Filled 2020-09-16 (×2): qty 1

## 2020-09-16 MED ORDER — SODIUM CHLORIDE 0.9 % IV SOLN
500.0000 mg | INTRAVENOUS | Status: DC
  Administered 2020-09-16: 500 mg via INTRAVENOUS
  Filled 2020-09-16: qty 500

## 2020-09-16 MED ORDER — ALBUTEROL SULFATE HFA 108 (90 BASE) MCG/ACT IN AERS
2.0000 | INHALATION_SPRAY | Freq: Four times a day (QID) | RESPIRATORY_TRACT | Status: DC | PRN
  Filled 2020-09-16: qty 6.7

## 2020-09-16 MED ORDER — TAMSULOSIN HCL 0.4 MG PO CAPS
0.4000 mg | ORAL_CAPSULE | Freq: Every day | ORAL | Status: DC
  Administered 2020-09-16 – 2020-09-17 (×2): 0.4 mg via ORAL
  Filled 2020-09-16 (×2): qty 1

## 2020-09-16 MED ORDER — SODIUM CHLORIDE 0.9 % IV SOLN
2.0000 g | INTRAVENOUS | Status: DC
  Administered 2020-09-16: 2 g via INTRAVENOUS
  Filled 2020-09-16: qty 20

## 2020-09-16 MED ORDER — ACETAMINOPHEN 325 MG PO TABS: 650.0000 mg | ORAL_TABLET | Freq: Four times a day (QID) | ORAL | Status: DC | PRN

## 2020-09-16 MED ORDER — VITAMIN B-12 1000 MCG PO TABS
1000.0000 ug | ORAL_TABLET | Freq: Every day | ORAL | Status: DC
  Administered 2020-09-17 – 2020-09-18 (×2): 1000 ug via ORAL
  Filled 2020-09-16 (×2): qty 1

## 2020-09-16 MED ORDER — ASPIRIN EC 81 MG PO TBEC
81.0000 mg | DELAYED_RELEASE_TABLET | Freq: Every day | ORAL | Status: DC
  Administered 2020-09-17 – 2020-09-18 (×2): 81 mg via ORAL
  Filled 2020-09-16 (×2): qty 1

## 2020-09-16 MED ORDER — MOMETASONE FURO-FORMOTEROL FUM 200-5 MCG/ACT IN AERO
2.0000 | INHALATION_SPRAY | Freq: Two times a day (BID) | RESPIRATORY_TRACT | Status: DC
  Administered 2020-09-16 – 2020-09-18 (×4): 2 via RESPIRATORY_TRACT
  Filled 2020-09-16: qty 8.8

## 2020-09-16 MED ORDER — ALBUTEROL SULFATE (2.5 MG/3ML) 0.083% IN NEBU: 2.5000 mg | INHALATION_SOLUTION | RESPIRATORY_TRACT | Status: DC | PRN

## 2020-09-16 MED ORDER — IPRATROPIUM-ALBUTEROL 0.5-2.5 (3) MG/3ML IN SOLN
3.0000 mL | Freq: Once | RESPIRATORY_TRACT | Status: AC
  Administered 2020-09-16: 3 mL via RESPIRATORY_TRACT
  Filled 2020-09-16: qty 3

## 2020-09-16 MED ORDER — SODIUM CHLORIDE 0.9 % IV BOLUS
500.0000 mL | Freq: Once | INTRAVENOUS | Status: AC
  Administered 2020-09-16: 500 mL via INTRAVENOUS

## 2020-09-16 MED ORDER — LACTATED RINGERS IV SOLN: INTRAVENOUS | Status: DC

## 2020-09-16 MED ORDER — HYDRALAZINE HCL 20 MG/ML IJ SOLN
10.0000 mg | INTRAMUSCULAR | Status: DC | PRN
  Administered 2020-09-18: 10 mg via INTRAVENOUS
  Filled 2020-09-16: qty 1

## 2020-09-16 MED ORDER — DOCUSATE SODIUM 100 MG PO CAPS: 100.0000 mg | ORAL_CAPSULE | Freq: Two times a day (BID) | ORAL | Status: DC | PRN

## 2020-09-16 MED ORDER — SODIUM CHLORIDE 0.9 % IV SOLN
2.0000 g | INTRAVENOUS | Status: DC
  Administered 2020-09-17: 2 g via INTRAVENOUS
  Filled 2020-09-16: qty 2
  Filled 2020-09-16: qty 20

## 2020-09-16 MED ORDER — ENOXAPARIN SODIUM 30 MG/0.3ML ~~LOC~~ SOLN
30.0000 mg | SUBCUTANEOUS | Status: DC
  Administered 2020-09-16: 30 mg via SUBCUTANEOUS
  Filled 2020-09-16: qty 0.3

## 2020-09-16 MED ORDER — SODIUM CHLORIDE 0.9 % IV SOLN
500.0000 mg | INTRAVENOUS | Status: DC
  Filled 2020-09-16: qty 500

## 2020-09-16 MED ORDER — ONDANSETRON HCL 4 MG PO TABS: 4.0000 mg | ORAL_TABLET | Freq: Four times a day (QID) | ORAL | Status: DC | PRN

## 2020-09-16 MED ORDER — ESCITALOPRAM OXALATE 10 MG PO TABS
5.0000 mg | ORAL_TABLET | Freq: Every day | ORAL | Status: DC
  Administered 2020-09-17 – 2020-09-18 (×2): 5 mg via ORAL
  Filled 2020-09-16 (×2): qty 0.5

## 2020-09-16 MED ORDER — ONDANSETRON HCL 4 MG/2ML IJ SOLN: 4.0000 mg | Freq: Four times a day (QID) | INTRAMUSCULAR | Status: DC | PRN

## 2020-09-16 MED ORDER — SODIUM CHLORIDE 0.9 % IV BOLUS
1000.0000 mL | Freq: Once | INTRAVENOUS | Status: AC
  Administered 2020-09-16: 1000 mL via INTRAVENOUS

## 2020-09-16 MED ORDER — ACETAMINOPHEN 650 MG RE SUPP: 650.0000 mg | Freq: Four times a day (QID) | RECTAL | Status: DC | PRN

## 2020-09-16 MED ORDER — MONTELUKAST SODIUM 10 MG PO TABS
10.0000 mg | ORAL_TABLET | Freq: Every day | ORAL | Status: DC
  Administered 2020-09-16 – 2020-09-17 (×2): 10 mg via ORAL
  Filled 2020-09-16 (×2): qty 1

## 2020-09-16 NOTE — H&P (Signed)
History and Physical    Bethany [name] OTL:[id number] DOB: 1935/11/22 DOA: 09/16/2020  PCP: Barbette Reichmann, MD Patient coming from: Home  I have personally briefly reviewed patient's old medical records in [facility]  Chief Complaint: Lethargy, altered mentation  HPI: Bethany [name] is a 85 y.o. female with medical history significant of COPD, chronic respiratory failure on 2 L, hypertension, hyperlipidemia who presents to the emergency department from home for evaluation of lethargy and altered mentation over the past several days.  Notes that the patient is lethargic and encephalopathic on my examination and is unable to provide history.  Majority of history gained by reading medical record, speaking with the patient's son via phone, speaking CDP.  Patient lives alone and is on chronic opioid therapy.  Per her son she administers this on her own.  He is unaware of any dosing changes.  Per the son the patient lives alone and has been increasingly lethargic with poor p.o. intake.  Family member went to check on her this morning and stated she looked very pale and frail.  Such she called EMS and patient presented to ED.  On presentation patient was lethargic and altered from baseline.  She does appear very thin and frail.  CT head pursued due to encephalopathy, no acute issues.  Chest x-ray demonstrates lucency possibly consistent with pneumonia.  Patient was given initial doses of Rocephin and azithromycin, intravenous fluids.  Hospitalist contacted for admission.   ED Course: Initial vital signs demonstrates relative bradycardia, relative hypotension.  Otherwise stable.  Laboratory derangements include AKI with creatinine 1.76.  Chest imaging demonstrates right-sided lucency possibly consistent with pneumonia.  Patient was given empiric antibiotics and intravenous fluids.  Hospitalist contacted for admission.  Review of Systems: ROS cannot be performed due to mental status  Past  Medical History:  Diagnosis Date  . Allergy   . Anxiety   . Arthritis   . COPD (chronic obstructive pulmonary disease) (HCC)   . Hyperlipidemia   . Hypertension   . Oxygen deficiency     Past Surgical History:  Procedure Laterality Date  . ABDOMINAL HYSTERECTOMY    . SPINE SURGERY     Back surgery x8     reports that she quit smoking about 5 years ago. Her smoking use included cigarettes. She has a 45.00 pack-year smoking history. She has never used smokeless tobacco. She reports that she does not drink alcohol and does not use drugs.  Allergies  Allergen Reactions  . Codeine Itching  . Penicillins Other (See Comments)    Patient unsure  . Sulfa Antibiotics Itching    Family History  Problem Relation Age of Onset  . Breast cancer Maternal Aunt   . Heart disease Mother   . ALS Father   . Heart disease Father   . Lymphoma Brother     Prior to Admission medications   Medication Sig Start Date End Date Taking? Authorizing Provider  albuterol (PROVENTIL HFA;VENTOLIN HFA) 108 (90 Base) MCG/ACT inhaler Inhale 2 puffs into the lungs every 6 (six) hours as needed for wheezing or shortness of breath. 03/23/16   Emily Filbert, MD  aspirin (ASPIRIN EC) 81 MG EC tablet Take 81 mg by mouth daily. Swallow whole.    [provider]  baclofen (LIORESAL) 10 MG tablet Take 10 mg by mouth 3 (three) times daily.    [provider]  CALCIUM CARBONATE PO Take 1 tablet by mouth daily.    [provider]  cetirizine (ZYRTEC) 10 MG tablet Take 10 mg by mouth daily.    [provider]  Cholecalciferol (VITAMIN D3) 1000 units CAPS Take 1 capsule by mouth daily.    [provider]  diazepam (VALIUM) 5 MG tablet Take 5 mg by mouth at bedtime. Patient takes once at night Reported on 10/16/2015    [provider]  docusate sodium (COLACE) 100 MG capsule Take 100 mg by mouth 2 (two) times daily as needed for mild constipation.    [provider]  escitalopram (LEXAPRO) 5 MG tablet Take 10 mg by mouth daily.     [provider]  Fluticasone-Salmeterol (ADVAIR) 250-50 MCG/DOSE AEPB Inhale 1 puff into the lungs 2 (two) times daily.    [provider]  gabapentin (NEURONTIN) 300 MG capsule Take 300 mg by mouth 3 (three) times daily.    [provider]  HYDROcodone-acetaminophen (NORCO/VICODIN) 5-325 MG tablet Take 1 tablet by mouth 5 (five) times daily as needed for moderate pain. Do not exceed 5 times daily, patient at least 3 day    [provider]  losartan (COZAAR) 50 MG tablet Take 50 mg by mouth daily.    [provider]  lovastatin (MEVACOR) 20 MG tablet Take 20 mg by mouth at bedtime. Reported on 12/12/2015    [provider]  metoprolol succinate (TOPROL-XL) 25 MG 24 hr tablet Take 25 mg by mouth daily.    [provider]  montelukast (SINGULAIR) 10 MG tablet Take 10 mg by mouth at bedtime. Reported on 12/12/2015    [provider]  tamsulosin (FLOMAX) 0.4 MG CAPS capsule Take 0.4 mg by mouth daily after supper.    [provider]  vitamin B-12 (CYANOCOBALAMIN) 1000 MCG tablet Take 1,000 mcg by mouth daily.    [provider]    Physical Exam: Vitals:   09/16/20 1530 09/16/20 1600 09/16/20 1630 09/16/20 1700  BP: 124/87 (!) 127/57 (!) 106/49 (!) 96/46  Pulse: 95 (!) 54 (!) 49 (!) 52  Resp: 14 15 11 10   Temp:      TempSrc:      SpO2: 96% 96% 96% 99%  Weight:      Height:         Vitals:   09/16/20 1530 09/16/20 1600 09/16/20 1630 09/16/20 1700  BP: 124/87 (!) 127/57 (!) 106/49 (!) 96/46  Pulse: 95 (!) 54 (!) 49 (!) 52  Resp: 14 15 11 10   Temp:      TempSrc:      SpO2: 96% 96% 96% 99%  Weight:      Height:      Constitutional: No acute distress, lethargic, difficult to awaken Eyes: PERRL, lids and conjunctivae pale ENMT: Mucous membranes are dry. Posterior pharynx clear of any exudate or lesions.poor dentition.   Neck: normal, supple, no masses, no thyromegaly Respiratory:   Diminished breath sounds bilaterally.  Right-sided crackles.  Normal work of breathing.  2 L Cardiovascular: Bradycardic, regular rhythm, no murmurs, no pedal edema Abdomen: Thin, nontender, nondistended, normal bowel sounds Musculoskeletal: Muscle wasting noted bilaterally.  Range of motion not assessed.  No obvious joint fomites Skin: Thin and pale with no rashes or lesions Neurologic: No obvious cranial nerve deficits.  Sensation intact.  DTRs not assessed.  Strength reduced Psychiatric: Impaired judgment and insight.  Lethargic, oriented x0, flattened affect  Labs on Admission: I have personally reviewed following labs and imaging studies  CBC: Recent Labs  Lab 09/16/20 1253  WBC 7.8  HGB 12.0  HCT 36.6  MCV 97.3  PLT 218   Basic Metabolic Panel: Recent Labs  Lab 09/16/20 1253  NA 135  K 4.5  CL 101  CO2 23  GLUCOSE 147*  BUN 40*  CREATININE 1.76*  CALCIUM 9.4   GFR: Estimated Creatinine Clearance: 22.2 mL/min (A) (by C-G formula based on SCr of 1.76 mg/dL (H)). Liver Function Tests: Recent Labs  Lab 09/16/20 1253  AST 27  ALT 11  ALKPHOS 51  BILITOT 1.1  PROT 6.7  ALBUMIN 3.9   No results for input(s): LIPASE, AMYLASE in the last 168 hours. No results for input(s): AMMONIA in the last 168 hours. Coagulation Profile: No results for input(s): INR, PROTIME in the last 168 hours. Cardiac Enzymes: No results for input(s): CKTOTAL, CKMB, CKMBINDEX, TROPONINI in the last 168 hours. BNP (last 3 results) No results for input(s): PROBNP in the last 8760 hours. HbA1C: No results for input(s): HGBA1C in the last 72 hours. CBG: No results for input(s): GLUCAP in the last 168 hours. Lipid Profile: No results for input(s): CHOL, HDL, LDLCALC, TRIG, CHOLHDL, LDLDIRECT in the last 72 hours. Thyroid Function Tests: No results for input(s): TSH, T4TOTAL, FREET4, T3FREE, THYROIDAB in the last 72  hours. Anemia Panel: No results for input(s): VITAMINB12, FOLATE, FERRITIN, TIBC, IRON, RETICCTPCT in the last 72 hours. Urine analysis:    Component Value Date/Time   COLORURINE STRAW (A) 07/05/2019 1537   APPEARANCEUR CLEAR (A) 07/05/2019 1537   APPEARANCEUR Cloudy 09/08/2014 1405   LABSPEC 1.003 (L) 07/05/2019 1537   LABSPEC 1.026 09/08/2014 1405   PHURINE 5.0 07/05/2019 1537   GLUCOSEU NEGATIVE 07/05/2019 1537   GLUCOSEU Negative 09/08/2014 1405   HGBUR NEGATIVE 07/05/2019 1537   BILIRUBINUR NEGATIVE 07/05/2019 1537   BILIRUBINUR Negative 09/08/2014 1405   KETONESUR NEGATIVE 07/05/2019 1537   PROTEINUR NEGATIVE 07/05/2019 1537   NITRITE NEGATIVE 07/05/2019 1537   LEUKOCYTESUR SMALL (A) 07/05/2019 1537   LEUKOCYTESUR 3+ 09/08/2014 1405    Radiological Exams on Admission: CT Head Wo Contrast  Result Date: 09/16/2020 CLINICAL DATA:  Altered mental status. EXAM: CT HEAD WITHOUT CONTRAST TECHNIQUE: Contiguous axial images were obtained from the base of the skull through the vertex without intravenous contrast. COMPARISON:  Head CT 01/15/2020 FINDINGS: Brain: Stable degree of atrophy and chronic small vessel ischemia from prior exam. No intracranial hemorrhage, mass effect, or midline shift. No hydrocephalus. The basilar cisterns are patent. No evidence of territorial infarct or acute ischemia. No extra-axial or intracranial fluid collection. Vascular: Atherosclerosis of skullbase vasculature without hyperdense vessel or abnormal calcification. Skull: No fracture or focal lesion. Sinuses/Orbits: No acute findings. Remote left orbital fracture. Bilateral cataract resection. Similar opacification of lower right mastoid air cells. Paranasal sinuses are clear. Other: None. IMPRESSION: 1. No acute intracranial abnormality. 2. Stable atrophy and chronic small vessel ischemia. Electronically Signed   By: Narda Rutherford M.D.   On: 09/16/2020 15:08   DG Chest Portable 1 View  Result Date:  09/16/2020 CLINICAL DATA:  Altered mental status EXAM: PORTABLE CHEST 1 VIEW COMPARISON:  03/23/2016 FINDINGS: Cardiomediastinal silhouette and pulmonary vasculature are within normal limits. Focal opacity in the right lung base may be atelectasis or pneumonia. IMPRESSION: Focal opacity in the right medial lung base may be due to atelectasis or pneumonia. Electronically Signed   By: Acquanetta Belling M.D.   On: 09/16/2020 14:52    EKG: Independently reviewed.  Normal sinus rhythm  Assessment/Plan Active Problems:   CAP (community acquired pneumonia)  Community-acquired pneumonia Chronic hypoxic respiratory failure Does not meet sepsis criteria Is on small amount of oxygen, 1 to 2 L Afebrile on admission Plan: Rocephin 2 g IV daily x5 days Azithromycin 500 mg IV x3 days Maintenance IV fluids Follow blood cultures Monitor vitals and fever curve Oxygen as necessary, wean as tolerated Check procalcitonin  Acute metabolic encephalopathy Likely multifactorial secondary to inadequate p.o. nutrition and community-acquired pneumonia Also could be related to chronic opioid regimen Uremia on differential considering elevated BUN Per son patient doses of medications Currently verbally unresponsive but hemodynamically stable and maintaining airway Plan: Hold home regimen of sedating medications including oxycodone, gabapentin, baclofen Frequent neuro checks  Adult failure to thrive Per EDP and patient's son she has not been eating much Unclear exactly how long this has been going on Likely worsening some of her acute issues Plan: Nutrition consult Regular diet when able Palliative care consult DNR status  COPD Does not appear acutely exacerbated On home rate of oxygen Continue home inhaler regimen Supplemental oxygen necessary As needed nebs Continue home montelukast  Hypertension Home metoprolol on hold Home lisinopril on hold  Acute kidney injury Likely prerenal azotemia in  setting of poor p.o. intake Elevated BUN supports this hypothesis Plan: Hold ACE inhibitor IV fluids as above      DVT prophylaxis: SQ Lovenox  code Status: DNR confirmed with [name] over the phone Family Communication: Brother Azya Barbero [phone] on 4/11 Disposition Plan: Unknown at this time, SNF versus home with home health  consults called: Palliative care Admission status: Inpatient, PCU   Tresa Moore MD [facility]  If 7PM-7AM, please contact night-coverage   09/16/2020, 5:25 PM

## 2020-09-16 NOTE — ED Provider Notes (Signed)
[facility] Emergency Department Provider Note    Event Date/Time   First MD Initiated Contact with Patient 09/16/20 1300     (approximate)  I have reviewed the triage vital signs and the nursing notes.   HISTORY  Chief Complaint Altered Mental Status  Level V Caveat:  AMS  HPI Bethany [name] is a 85 y.o. female below listed past medical history presents to the ER for altered mental status.  Last seen normal 2 days ago.  Lives at home alone.  Has a history of COPD and wears oxygen also on chronic pain meds.  Family member went to check on her this morning states that she looked paler and frail.  Report is not been eating much lately.  Patient unable to provide any additional history.    Past Medical History:  Diagnosis Date  . Allergy   . Anxiety   . Arthritis   . COPD (chronic obstructive pulmonary disease) (HCC)   . Hyperlipidemia   . Hypertension   . Oxygen deficiency    Family History  Problem Relation Age of Onset  . Breast cancer Maternal Aunt   . Heart disease Mother   . ALS Father   . Heart disease Father   . Lymphoma Brother    Past Surgical History:  Procedure Laterality Date  . ABDOMINAL HYSTERECTOMY    . SPINE SURGERY     Back surgery x8   Patient Active Problem List   Diagnosis Date Noted  . Carotid stenosis 01/03/2017  . PAD (peripheral artery disease) (HCC) 01/03/2017  . Back pain, chronic 09/13/2015  . Degenerative arthritis of lumbar spine 06/13/2015  . Low serum cobalamin 03/21/2015  . Anxiety 09/25/2014  . Chronic obstructive pulmonary disease (HCC) 11/13/2013  . BP (high blood pressure) 11/13/2013  . HLD (hyperlipidemia) 11/13/2013      Prior to Admission medications   Medication Sig Start Date End Date Taking? Authorizing Provider  albuterol (PROVENTIL HFA;VENTOLIN HFA) 108 (90 Base) MCG/ACT inhaler Inhale 2 puffs into the lungs every 6 (six) hours as needed for wheezing or shortness of breath. 03/23/16    Emily Filbert, MD  aspirin (ASPIRIN EC) 81 MG EC tablet Take 81 mg by mouth daily. Swallow whole.    [provider]  baclofen (LIORESAL) 10 MG tablet Take 10 mg by mouth 3 (three) times daily.    [provider]  CALCIUM CARBONATE PO Take 1 tablet by mouth daily.    [provider]  cetirizine (ZYRTEC) 10 MG tablet Take 10 mg by mouth daily.    [provider]  Cholecalciferol (VITAMIN D3) 1000 units CAPS Take 1 capsule by mouth daily.    [provider]  diazepam (VALIUM) 5 MG tablet Take 5 mg by mouth at bedtime. Patient takes once at night Reported on 10/16/2015    [provider]  docusate sodium (COLACE) 100 MG capsule Take 100 mg by mouth 2 (two) times daily as needed for mild constipation.    [provider]  escitalopram (LEXAPRO) 5 MG tablet Take 10 mg by mouth daily.     [provider]  Fluticasone-Salmeterol (ADVAIR) 250-50 MCG/DOSE AEPB Inhale 1 puff into the lungs 2 (two) times daily.    [provider]  gabapentin (NEURONTIN) 300 MG capsule Take 300 mg by mouth 3 (three) times daily.    [provider]  HYDROcodone-acetaminophen (NORCO/VICODIN) 5-325 MG tablet Take 1 tablet by mouth 5 (five) times daily as needed for  moderate pain. Do not exceed 5 times daily, patient at least 3 day    [provider]  losartan (COZAAR) 50 MG tablet Take 50 mg by mouth daily.    [provider]  lovastatin (MEVACOR) 20 MG tablet Take 20 mg by mouth at bedtime. Reported on 12/12/2015    [provider]  metoprolol succinate (TOPROL-XL) 25 MG 24 hr tablet Take 25 mg by mouth daily.    [provider]  montelukast (SINGULAIR) 10 MG tablet Take 10 mg by mouth at bedtime. Reported on 12/12/2015    [provider]  tamsulosin (FLOMAX) 0.4 MG CAPS capsule Take 0.4 mg by mouth daily after supper.    [provider]  vitamin B-12 (CYANOCOBALAMIN) 1000 MCG tablet  Take 1,000 mcg by mouth daily.    [provider]    Allergies Codeine, Penicillins, and Sulfa antibiotics    Social History Social History   Tobacco Use  . Smoking status: Former Smoker    Packs/day: 1.50    Years: 30.00    Pack years: 45.00    Types: Cigarettes    Quit date: 10/06/2014    Years since quitting: 5.9  . Smokeless tobacco: Never Used  Vaping Use  . Vaping Use: Never used  Substance Use Topics  . Alcohol use: No  . Drug use: No    Review of Systems Patient denies headaches, rhinorrhea, blurry vision, numbness, shortness of breath, chest pain, edema, cough, abdominal pain, nausea, vomiting, diarrhea, dysuria, fevers, rashes or hallucinations unless otherwise stated above in HPI. ____________________________________________   PHYSICAL EXAM:  VITAL SIGNS: Vitals:   09/16/20 1400 09/16/20 1500  BP: (!) 97/46 (!) 91/47  Pulse: (!) 47 (!) 50  Resp: 12 12  Temp:    SpO2: 99% 95%    Constitutional: Alert, ill appearing, noncommunicative Eyes: Conjunctivae are normal.  Head: Atraumatic. Nose: No congestion/rhinnorhea. Mouth/Throat: Mucous membranes are moist.   Neck: No stridor. Painless ROM.  Cardiovascular: Normal rate, regular rhythm. Grossly normal heart sounds.  Good peripheral circulation. Respiratory: Normal respiratory effort.  No retractions. Lungs CTAB. Gastrointestinal: Soft and nontender. No distention. No abdominal bruits. No CVA tenderness. Genitourinary: deferred Musculoskeletal: No lower extremity tenderness nor edema.  No joint effusions. Neurologic:  Normal speech and language. No gross focal neurologic deficits are appreciated. No facial droop Skin:  Skin is warm, dry and intact. No rash noted. Psychiatric: calm, cooperative.  ____________________________________________   LABS (all labs ordered are listed, but only abnormal results are displayed)  Results for orders placed or performed during the hospital encounter of  09/16/20 (from the past 24 hour(s))  CBC     Status: Abnormal   Collection Time: 09/16/20 12:53 PM  Result Value Ref Range   WBC 7.8 4.0 - 10.5 K/uL   RBC 3.76 (L) 3.87 - 5.11 MIL/uL   Hemoglobin 12.0 12.0 - 15.0 g/dL   HCT 73.5 32.9 - 92.4 %   MCV 97.3 80.0 - 100.0 fL   MCH 31.9 26.0 - 34.0 pg   MCHC 32.8 30.0 - 36.0 g/dL   RDW 26.8 34.1 - 96.2 %   Platelets 218 150 - 400 K/uL   nRBC 0.0 0.0 - 0.2 %  Comprehensive metabolic panel     Status: Abnormal   Collection Time: 09/16/20 12:53 PM  Result Value Ref Range   Sodium 135 135 - 145 mmol/L   Potassium 4.5 3.5 - 5.1 mmol/L   Chloride 101 98 - 111 mmol/L   CO2  23 22 - 32 mmol/L   Glucose, Bld 147 (H) 70 - 99 mg/dL   BUN 40 (H) 8 - 23 mg/dL   Creatinine, Ser 1.61 (H) 0.44 - 1.00 mg/dL   Calcium 9.4 8.9 - 09.6 mg/dL   Total Protein 6.7 6.5 - 8.1 g/dL   Albumin 3.9 3.5 - 5.0 g/dL   AST 27 15 - 41 U/L   ALT 11 0 - 44 U/L   Alkaline Phosphatase 51 38 - 126 U/L   Total Bilirubin 1.1 0.3 - 1.2 mg/dL   GFR, Estimated 29 (L) >60 mL/min   Anion gap 11 5 - 15  Sample to Blood Bank     Status: None   Collection Time: 09/16/20 12:53 PM  Result Value Ref Range   Blood Bank Specimen SAMPLE AVAILABLE FOR TESTING    Sample Expiration      09/19/2020,2359 Performed at [facility], [address] [place], [place] [address]   Blood gas, venous     Status: None   Collection Time: 09/16/20  1:38 PM  Result Value Ref Range   pH, Ven 7.28 7.250 - 7.430   pCO2, Ven 57 44.0 - 60.0 mmHg   pO2, Ven 44.0 32.0 - 45.0 mmHg   Bicarbonate 26.8 20.0 - 28.0 mmol/L   Acid-base deficit 0.8 0.0 - 2.0 mmol/L   O2 Saturation 72.9 %   Patient temperature 37.0    Collection site VEIN    Sample type VENOUS    ____________________________________________  EKG My review and personal interpretation at Time: 12:49   Indication: ams  Rate: 70  Rhythm: sinus Axis: normal Other: normal intervals, no  stemi ____________________________________________  RADIOLOGY  I personally reviewed all radiographic images ordered to evaluate for the above acute complaints and reviewed radiology reports and findings.  These findings were personally discussed with the patient.  Please see medical record for radiology report.  ____________________________________________   PROCEDURES  Procedure(s) performed:  Procedures    Critical Care performed: no ____________________________________________   INITIAL IMPRESSION / ASSESSMENT AND PLAN / ED COURSE  Pertinent labs & imaging results that were available during my care of the patient were reviewed by me and considered in my medical decision making (see chart for details).   DDX: Dehydration, sepsis, pna, uti, hypoglycemia, cva, drug effect, withdrawal, encephalitis   MIKELLA LINSLEY is a 85 y.o. who presents to the ED with her mental status as described above.  Patient unable to provide much history.  Last seen normal 2 days ago.  Blood work will be sent for by differential will order imaging.  Blood pressure low will give IV fluids.  Patient very frail.  Probably has poor p.o. intake may be dehydration.  The patient will be placed on continuous pulse oximetry and telemetry for monitoring.  Laboratory evaluation will be sent to evaluate for the above complaints.     Clinical Course as of 09/16/20 1535  Mon Sep 16, 2020  1520 Went to reassess patient and she is being helped up to the bedside commode with nurse try to get urine sample.  CT head without acute abnormality.  Patient's blood pressure soft but she is also very skinny and small habitus.  We will continue with IV hydration. [PR]  1532 Given chest x-ray findings concerning for possible pneumonia with her altered mental status and hypotension blood cultures obtained and broad-spectrum antibiotics initiated.  I think that most of her hypotension is likely dehydration therefore will reassess  after IV  fluid resuscitation.  Lactate was added on.  Given lack of fever or white count have a lower suspicion for sepsis but given possibility pneumonia that remains a possibility.  Patient will require hospitalization.  Patient signed out to oncoming physician pending reassessment of vital signs after fluid resuscitation to determine appropriate level of care. [PR]  1533 Calcium: 9.4 [PR]    Clinical Course User Index [PR] Willy Eddyobinson, [place] Yearby, MD    The patient was evaluated in Emergency Department today for the symptoms described in the history of present illness. He/she was evaluated in the context of the global COVID-19 pandemic, which necessitated consideration that the patient might be at risk for infection with the SARS-CoV-2 virus that causes COVID-19. Institutional protocols and algorithms that pertain to the evaluation of patients at risk for COVID-19 are in a state of rapid change based on information released by regulatory bodies including the CDC and federal and state organizations. These policies and algorithms were followed during the patient's care in the ED.  As part of my medical decision making, I reviewed the following data within the electronic MEDICAL RECORD NUMBER Nursing notes reviewed and incorporated, Labs reviewed, notes from prior ED visits and [place] Controlled Substance Database   ____________________________________________   FINAL CLINICAL IMPRESSION(S) / ED DIAGNOSES  Final diagnoses:  Altered mental status, unspecified altered mental status type      NEW MEDICATIONS STARTED DURING THIS VISIT:  New Prescriptions   No medications on file     Note:  This document was prepared using Dragon voice recognition software and may include unintentional dictation errors.    Willy Eddyobinson, Solymar Grace, MD 09/16/20 1535

## 2020-09-16 NOTE — ED Triage Notes (Signed)
Pt to ED via POV with her brother, pt's brother reports today that he was unable to wake patient up upon arrival to house, reports that today patient is unable to talk/communicate with is abnormal from baseline. Pt noted to be staring off into the distance on arrival to triage room.   Pt also noted to be pale on arrival to triage room. Pt's brother reports that patient lives alone. Pt with note difficulty following commands however is noted to be alert in triage room.

## 2020-09-16 NOTE — Progress Notes (Signed)
Patient arrived to unit in no distress. VSS 

## 2020-09-16 NOTE — Progress Notes (Signed)
PHARMACIST - PHYSICIAN COMMUNICATION  CONCERNING:  Enoxaparin (Lovenox) for DVT Prophylaxis   DESCRIPTION: Patient was prescribed enoxaprin 40mg  q24 hours for VTE prophylaxis.   Filed Weights   09/16/20 1240  Weight: 59 kg (130 lb)    Body mass index is 21.63 kg/m.  Estimated Creatinine Clearance: 22.2 mL/min (A) (by C-G formula based on SCr of 1.76 mg/dL (H)).  Patient is candidate for enoxaparin 30mg  every 24 hours based on CrCl <73ml/min or Weight <45kg  RECOMMENDATION: Pharmacy has adjusted enoxaparin dose per [facility] policy.  Patient is now receiving enoxaparin 30 mg every 24 hours    31m, PharmD Clinical Pharmacist  09/16/2020 8:41 PM

## 2020-09-17 ENCOUNTER — Encounter: Payer: Self-pay | Admitting: Internal Medicine

## 2020-09-17 DIAGNOSIS — R4182 Altered mental status, unspecified: Secondary | ICD-10-CM

## 2020-09-17 DIAGNOSIS — Z515 Encounter for palliative care: Secondary | ICD-10-CM

## 2020-09-17 LAB — BASIC METABOLIC PANEL
Anion gap: 6 (ref 5–15)
BUN: 28 mg/dL — ABNORMAL HIGH (ref 8–23)
CO2: 25 mmol/L (ref 22–32)
Calcium: 8.9 mg/dL (ref 8.9–10.3)
Chloride: 110 mmol/L (ref 98–111)
Creatinine, Ser: 1.05 mg/dL — ABNORMAL HIGH (ref 0.44–1.00)
GFR, Estimated: 53 mL/min — ABNORMAL LOW (ref >60)
Glucose, Bld: 81 mg/dL (ref 70–99)
Potassium: 3.9 mmol/L (ref 3.5–5.1)
Sodium: 141 mmol/L (ref 135–145)

## 2020-09-17 LAB — CBC
HCT: 35.3 % — ABNORMAL LOW (ref 36.0–46.0)
Hemoglobin: 11.7 g/dL — ABNORMAL LOW (ref 12.0–15.0)
MCH: 32.4 pg (ref 26.0–34.0)
MCHC: 33.1 g/dL (ref 30.0–36.0)
MCV: 97.8 fL (ref 80.0–100.0)
Platelets: 165 10*3/uL (ref 150–400)
RBC: 3.61 MIL/uL — ABNORMAL LOW (ref 3.87–5.11)
RDW: 13.2 % (ref 11.5–15.5)
WBC: 6.8 10*3/uL (ref 4.0–10.5)
nRBC: 0 % (ref 0.0–0.2)

## 2020-09-17 LAB — PROCALCITONIN: Procalcitonin: <0.1 ng/mL

## 2020-09-17 MED ORDER — ENSURE ENLIVE PO LIQD
237.0000 mL | Freq: Three times a day (TID) | ORAL | Status: DC
  Administered 2020-09-17 – 2020-09-18 (×2): 237 mL via ORAL

## 2020-09-17 MED ORDER — ENOXAPARIN SODIUM 40 MG/0.4ML ~~LOC~~ SOLN
40.0000 mg | SUBCUTANEOUS | Status: DC
  Administered 2020-09-17: 40 mg via SUBCUTANEOUS
  Filled 2020-09-17: qty 0.4

## 2020-09-17 MED ORDER — SODIUM CHLORIDE 0.9 % IV SOLN
500.0000 mg | INTRAVENOUS | Status: DC
  Administered 2020-09-17: 500 mg via INTRAVENOUS
  Filled 2020-09-17 (×2): qty 500

## 2020-09-17 MED ORDER — ADULT MULTIVITAMIN W/MINERALS CH
1.0000 | ORAL_TABLET | Freq: Every day | ORAL | Status: DC
  Administered 2020-09-18: 1 via ORAL
  Filled 2020-09-17: qty 1

## 2020-09-17 MED ORDER — METOPROLOL SUCCINATE ER 25 MG PO TB24
25.0000 mg | ORAL_TABLET | Freq: Every day | ORAL | Status: DC
  Administered 2020-09-17 – 2020-09-18 (×2): 25 mg via ORAL
  Filled 2020-09-17 (×2): qty 1

## 2020-09-17 MED ORDER — OXYCODONE HCL 5 MG PO TABS: 5.0000 mg | ORAL_TABLET | ORAL | Status: DC | PRN

## 2020-09-17 NOTE — Progress Notes (Signed)
Initial Nutrition Assessment  DOCUMENTATION CODES:   Non-severe (moderate) malnutrition in context of chronic illness  INTERVENTION:   -Ensure Enlive po TID, each supplement provides 350 kcal and 20 grams of protein -MVI with minerals daily  NUTRITION DIAGNOSIS:   Moderate Malnutrition related to chronic illness (COPD) as evidenced by energy intake < 75% for > or equal to 1 month,mild fat depletion,mild muscle depletion,moderate muscle depletion.  GOAL:   Patient will meet greater than or equal to 90% of their needs  MONITOR:   PO intake,Supplement acceptance,Labs,Weight trends,Skin,I & O's  REASON FOR ASSESSMENT:   Consult Assessment of nutrition requirement/status  ASSESSMENT:   Bethany [name] is a 85 y.o. female with medical history significant of COPD, chronic respiratory failure on 2 L, hypertension, hyperlipidemia who presents to the emergency department from home for evaluation of lethargy and altered mentation over the past several days.  Notes that the patient is lethargic and encephalopathic on my examination and is unable to provide history.  Majority of history gained by reading medical record, speaking with the patient's son via phone, speaking CDP.  Pt admitted with CAP and acute metabolic encephalopathy.   Reviewed I/O's: -280 ml x 24 hours  UOP: 400 ml x 24 hours  Spoke with pt at bedside, who was pleasant and in good spirits today. She shares that she has had ongoing poor oral intake, as she just doesn't feel like eating. Noted lunch tray was unattempted. Pt reports that she usually consumes 2 meals per day PTA (Breakfast: eggs, sausage, and chocolate milk; Dinner: meat, starch, and vegetable). Pt shares that she often awakens late in the morning, which is why she skips a formal lunch meal.   Pt endorses wt loss, but is unsure of UBW of how much weight she has lost. Noted distant history of weight loss.   Discussed importance of good meal and supplement  intake to promote healing. Pt amenable to Ensure Enlive supplements.   Medications reviewed and include vitamin B-12 and lactated ringers infusion @ 50 ml/hr.   Noted palliative care consult pending.   Labs reviewed.   NUTRITION - FOCUSED PHYSICAL EXAM:  Flowsheet Row Most Recent Value  Orbital Region Moderate depletion  Upper Arm Region Mild depletion  Thoracic and Lumbar Region No depletion  Buccal Region No depletion  Temple Region Mild depletion  Clavicle Bone Region Moderate depletion  Clavicle and Acromion Bone Region Mild depletion  Scapular Bone Region Mild depletion  Dorsal Hand Mild depletion  Patellar Region No depletion  Anterior Thigh Region No depletion  Posterior Calf Region No depletion  Edema (RD Assessment) None  Hair Reviewed  Eyes Reviewed  Mouth Reviewed  Skin Reviewed  Nails Reviewed       Diet Order:   Diet Order            Diet regular Room service appropriate? Yes; Fluid consistency: Thin  Diet effective now                 EDUCATION NEEDS:   Education needs have been addressed  Skin:  Skin Assessment: Reviewed RN Assessment  Last BM:  Unknown  Height:   Ht Readings from Last 1 Encounters:  09/16/20 5\' 5"  (1.651 m)    Weight:   Wt Readings from Last 1 Encounters:  09/16/20 59 kg    Ideal Body Weight:  56.8 kg  BMI:  Body mass index is 21.63 kg/m.  Estimated Nutritional Needs:   Kcal:  1600-1800  Protein:  80-95 grams  Fluid:  > 1.6 L    Levada Schilling, RD, LDN, CDCES Registered Dietitian II Certified Diabetes Care and Education Specialist Please refer to [facility] for RD and/or RD on-call/weekend/after hours pager

## 2020-09-17 NOTE — Evaluation (Signed)
Physical Therapy Evaluation Patient Details Name: Bethany [name] MRN: [id number] DOB: 1935/07/21 Today's Date: 09/17/2020   History of Present Illness  Patient is an 85 y.o. female with medical history significant of COPD, chronic respiratory failure on 2 L, hypertension, hyperlipidemia who presents to the emergency department from home for evaluation of lethargy and altered mentation over the past several days. Patient lives alone and is on chronic opioid therapy. Admitted with community-acquired pneumonia, chronic hypoxic respiratory failure, acute metabolic encephalopathy.  Encephalopathy appears to be due to dehydration, poor p.o. intake, excessive sedative medication use per notes.   Clinical Impression  Patient agreeable to PT. Patient is oriented to self and place only. Patient reports she was independent prior to arrival to hospital without assistive device. Patient is able to follow all single step commands without difficulty. Patient was Mod  I for bed mobility, supervision for standing multiple bouts, and Min guard assistance for ambulating 3 bouts in room using rolling walker. Recommend use of rolling walker for safety and fall prevention at this time. Recommend PT follow up to  maximize independence in preparation for home discharge. Given possible medication management issues and impaired cognition, recommend supervision from family as needed for safe transition home. HHPT also recommended.     Follow Up Recommendations Home health PT;Supervision - Intermittent    Equipment Recommendations  Rolling walker with 5" wheels    Recommendations for Other Services       Precautions / Restrictions Precautions Precautions: Fall Restrictions Weight Bearing Restrictions: No      Mobility  Bed Mobility Overal bed mobility: Modified Independent                  Transfers Overall transfer level: Needs assistance Equipment used: Rolling walker (2 wheeled) Transfers: Sit  to/from Stand Sit to Stand: Supervision         General transfer comment: verbal cues for hand placement for safety  Ambulation/Gait Ambulation/Gait assistance: Min guard Gait Distance (Feet): 20 Feet (x 3 bouts for total of 54ft) Assistive device: Rolling walker (2 wheeled) Gait Pattern/deviations: Step-through pattern Gait velocity: decreased   General Gait Details: patient ambulated multiple short bouts with rolling walker for safety. occasional cues for safety but no gross loss of balance with mobility. Sp02 88% on 1.5 L 02 with upright activity  Stairs            Wheelchair Mobility    Modified Rankin (Stroke Patients Only)       Balance Overall balance assessment: Needs assistance Sitting-balance support: Feet supported Sitting balance-Leahy Scale: Good     Standing balance support: Bilateral upper extremity supported Standing balance-Leahy Scale: Fair Standing balance comment: patient relying on rolling walker for support in standing                             Pertinent Vitals/Pain      Home Living Family/patient expects to be discharged to:: Private residence Living Arrangements:  (patient states she lives with brother, chart state she lives alone) Available Help at Discharge: Family;Available PRN/intermittently Type of Home: House Home Access: Stairs to enter Entrance Stairs-Rails: Can reach both Entrance Stairs-Number of Steps: 2 Home Layout: One level Home Equipment: Cane - single point;Shower seat;Grab bars - tub/shower Additional Comments: information is per patient report    Prior Function Level of Independence: Independent   Gait / Transfers Assistance Needed: patient reports she is independent with mobility without assistive device at  baseline. patient is on 2 L02 all the time per her report  ADL's / Homemaking Assistance Needed: patient reports she is independent, has a shower chair but does not use it. sometimes has shortness  of breath with ADL activity        Hand Dominance        Extremity/Trunk Assessment   Upper Extremity Assessment Upper Extremity Assessment: Overall WFL for tasks assessed    Lower Extremity Assessment Lower Extremity Assessment: Overall WFL for tasks assessed (4+/5 strength in BLE with knee extension, dorsiflexion. endurance impaired for prolonged standing activity)       Communication   Communication: No difficulties  Cognition Arousal/Alertness: Awake/alert Behavior During Therapy: WFL for tasks assessed/performed Overall Cognitive Status: Within Functional Limits for tasks assessed                                        General Comments      Exercises General Exercises - Lower Extremity Ankle Circles/Pumps: AROM;Strengthening;Both;10 reps;Seated Long Arc Quad: AROM;Strengthening;Both;10 reps;Seated Hip Flexion/Marching: AROM;Strengthening;Both;10 reps;Seated Other Exercises Other Exercises: verbal cues for exercise technique for strength/conditioning   Assessment/Plan    PT Assessment Patient needs continued PT services  PT Problem List Decreased strength;Decreased activity tolerance;Decreased balance;Decreased mobility;Decreased cognition;Decreased safety awareness;Cardiopulmonary status limiting activity       PT Treatment Interventions DME instruction;Gait training;Stair training;Functional mobility training;Therapeutic activities;Therapeutic exercise;Balance training;Neuromuscular re-education    PT Goals (Current goals can be found in the Care Plan section)  Acute Rehab PT Goals Patient Stated Goal: to return home PT Goal Formulation: With patient Time For Goal Achievement: 10/01/20 Potential to Achieve Goals: Good    Frequency Min 2X/week   Barriers to discharge        Co-evaluation               AM-PAC PT "6 Clicks" Mobility  Outcome Measure Help needed turning from your back to your side while in a flat bed without  using bedrails?: None Help needed moving from lying on your back to sitting on the side of a flat bed without using bedrails?: A Little Help needed moving to and from a bed to a chair (including a wheelchair)?: A Little Help needed standing up from a chair using your arms (e.g., wheelchair or bedside chair)?: A Little Help needed to walk in hospital room?: A Little Help needed climbing 3-5 steps with a railing? : A Little 6 Click Score: 19    End of Session Equipment Utilized During Treatment: Gait belt;Oxygen Activity Tolerance: Patient tolerated treatment well Patient left: in bed;with call bell/phone within reach;with bed alarm set Nurse Communication: Mobility status PT Visit Diagnosis: Muscle weakness (generalized) (M62.81)    Time: 1413-1440 PT Time Calculation (min) (ACUTE ONLY): 27 min   Charges:   PT Evaluation $PT Eval Moderate Complexity: 1 Mod PT Treatments $Therapeutic Exercise: 8-22 mins        Donna Bernard, PT, MPT   Ina Homes 09/17/2020, 3:28 PM

## 2020-09-17 NOTE — Progress Notes (Signed)
PROGRESS NOTE    Bethany [name]  UEA:[id number] DOB: 01/03/1936 DOA: 09/16/2020 PCP: Barbette ReichmannHande, Vishwanath, MD  Brief Narrative:  85 y.o. female with medical history significant of COPD, chronic respiratory failure on 2 L, hypertension, hyperlipidemia who presents to the emergency department from home for evaluation of lethargy and altered mentation over the past several days.  Notes that the patient is lethargic and encephalopathic on my examination and is unable to provide history.  Majority of history gained by reading medical record, speaking with the patient's son via phone, speaking CDP.  Patient lives alone and is on chronic opioid therapy.  Per her son she administers this on her own.  He is unaware of any dosing changes.  Per the son the patient lives alone and has been increasingly lethargic with poor p.o. intake.  Family member went to check on her this morning and stated she looked very pale and frail.  After admission and initiation of IV antibiotic therapy for community-acquired pneumonia, IV fluids, holding her home pain regimen patient mental status has improved substantially.  As of 4/12 she is up sitting in bed, eating breakfast, speaking clearly   Assessment & Plan:   Active Problems:   CAP (community acquired pneumonia)  Community-acquired pneumonia Chronic hypoxic respiratory failure Does not meet sepsis criteria Remains on 2 L, heart rate Afebrile on admission Plan: Rocephin 2 g IV daily x5 days, day 2/5 Azithromycin 500 mg IV x3 days, day 2/3 Continue gentle maintenance IV hydration Follow blood cultures, no growth to date Monitor vitals and fever curve Oxygen as necessary, wean as tolerated Check procalcitonin Possible discharge on 4/13 after palliative care consultation, nutrition evaluation, physical therapy occupational therapy  Acute metabolic encephalopathy Likely multifactorial secondary to inadequate p.o. nutrition and community-acquired pneumonia Also  could be related to chronic opioid regimen Uremia on differential considering elevated BUN Per son patient doses of medications Currently verbally unresponsive but hemodynamically stable and maintaining airway Plan: Cautiously restart oxycodone as to not precipitate withdrawals Continue holding gabapentin and baclofen Frequent neuro checks  Adult failure to thrive Per EDP and patient's son she has not been eating much Unclear exactly how long this has been going on Likely worsening some of her acute issues Plan: Nutrition consult Regular diet  Palliative care consult DNR status  COPD Does not appear acutely exacerbated On home rate of oxygen Continue home inhaler regimen Supplemental oxygen necessary As needed nebs Continue home montelukast  Hypertension Restart home metoprolol Continue holding lisinopril, restart as appropriate  Acute kidney injury Likely prerenal azotemia in setting of poor p.o. intake Elevated BUN supports this hypothesis Plan: Hold ACE inhibitor IV fluids as above   DVT prophylaxis: SQ Lovenox Code Status: DNR Family Communication: Sigurd SosClinton Griebel [phone] on 4/12 Disposition Plan: Status is: Inpatient  Remains inpatient appropriate because:Inpatient level of care appropriate due to severity of illness   Dispo: The patient is from: Home              Anticipated d/c is to: Home              Patient currently is not medically stable to d/c.   Difficult to place patient No  Mental status much improved from admission.  Encephalopathy appears to be due to dehydration, poor p.o. intake, excessive sedative medication use.  Possible discharge home in 24 hours after consultations with nutrition, PT, OT, palliative care.     Level of care: Med-Surg  Consultants:   Palliative care   Procedures: None  Antimicrobials:   Rocephin  Azithromycin   Subjective: Patient seen and examined.  Sitting up in bed eating.  No apparent  distress.  No pain complaints.  Objective: Vitals:   09/16/20 1900 09/16/20 1930 09/16/20 2200 09/17/20 0448  BP: (!) 116/54 134/66 140/68 (!) 121/59  Pulse: 67 66 70 63  Resp: 10 13  16   Temp:   97.8 F (36.6 C) 98 F (36.7 C)  TempSrc:   Oral Oral  SpO2: 96% 96% 96% 97%  Weight:      Height:        Intake/Output Summary (Last 24 hours) at 09/17/2020 1244 Last data filed at 09/17/2020 0945 Gross per 24 hour  Intake 120 ml  Output 400 ml  Net -280 ml   Filed Weights   09/16/20 1240  Weight: 59 kg    Examination:  General exam: No acute distress, sitting up in bed Respiratory system: Right-sided crackles.  Normal work of breathing.  2 L Cardiovascular system: S1 & S2 heard, RRR. No JVD, murmurs, rubs, gallops or clicks. No pedal edema. Gastrointestinal system: Abdomen is nondistended, soft and nontender. No organomegaly or masses felt. Normal bowel sounds heard. Central nervous system: Alert and oriented x2. No focal neurological deficits. Extremities: Symmetric 5 x 5 power. Skin: No rashes, lesions or ulcers Psychiatry: Judgement and insight appear normal. Mood & affect appropriate.     Data Reviewed: I have personally reviewed following labs and imaging studies  CBC: Recent Labs  Lab 09/16/20 1253 09/17/20 0447  WBC 7.8 6.8  HGB 12.0 11.7*  HCT 36.6 35.3*  MCV 97.3 97.8  PLT 218 165   Basic Metabolic Panel: Recent Labs  Lab 09/16/20 1253 09/17/20 0447  NA 135 141  K 4.5 3.9  CL 101 110  CO2 23 25  GLUCOSE 147* 81  BUN 40* 28*  CREATININE 1.76* 1.05*  CALCIUM 9.4 8.9   GFR: Estimated Creatinine Clearance: 37.2 mL/min (A) (by C-G formula based on SCr of 1.05 mg/dL (H)). Liver Function Tests: Recent Labs  Lab 09/16/20 1253  AST 27  ALT 11  ALKPHOS 51  BILITOT 1.1  PROT 6.7  ALBUMIN 3.9   No results for input(s): LIPASE, AMYLASE in the last 168 hours. No results for input(s): AMMONIA in the last 168 hours. Coagulation Profile: No results  for input(s): INR, PROTIME in the last 168 hours. Cardiac Enzymes: No results for input(s): CKTOTAL, CKMB, CKMBINDEX, TROPONINI in the last 168 hours. BNP (last 3 results) No results for input(s): PROBNP in the last 8760 hours. HbA1C: No results for input(s): HGBA1C in the last 72 hours. CBG: No results for input(s): GLUCAP in the last 168 hours. Lipid Profile: No results for input(s): CHOL, HDL, LDLCALC, TRIG, CHOLHDL, LDLDIRECT in the last 72 hours. Thyroid Function Tests: No results for input(s): TSH, T4TOTAL, FREET4, T3FREE, THYROIDAB in the last 72 hours. Anemia Panel: No results for input(s): VITAMINB12, FOLATE, FERRITIN, TIBC, IRON, RETICCTPCT in the last 72 hours. Sepsis Labs: Recent Labs  Lab 09/16/20 1455  LATICACIDVEN 1.7    Recent Results (from the past 240 hour(s))  Blood culture (routine x 2)     Status: None (Preliminary result)   Collection Time: 09/16/20  2:55 PM   Specimen: BLOOD  Result Value Ref Range Status   Specimen Description BLOOD BLOOD RIGHT FOREARM  Final   Special Requests   Final    BOTTLES DRAWN AEROBIC AND ANAEROBIC Blood Culture adequate volume   Culture   Final  NO GROWTH < 24 HOURS Performed at [facility], [address] [place], [place] [address]    Report Status PENDING  Incomplete  Blood culture (routine x 2)     Status: None (Preliminary result)   Collection Time: 09/16/20  2:55 PM   Specimen: BLOOD  Result Value Ref Range Status   Specimen Description BLOOD RIGHT ANTECUBITAL  Final   Special Requests   Final    BOTTLES DRAWN AEROBIC AND ANAEROBIC Blood Culture adequate volume   Culture   Final    NO GROWTH < 24 HOURS Performed at [facility], [address] [place], [place] [address]    Report Status PENDING  Incomplete  Resp Panel by RT-PCR (Flu A&B, Covid) Nasopharyngeal Swab     Status: None   Collection Time: 09/16/20  3:39 PM   Specimen: Nasopharyngeal Swab; Nasopharyngeal(NP) swabs in vial  transport medium  Result Value Ref Range Status   SARS Coronavirus 2 by RT PCR NEGATIVE NEGATIVE Final    Comment: (NOTE) SARS-CoV-2 target nucleic acids are NOT DETECTED.  The SARS-CoV-2 RNA is generally detectable in upper respiratory specimens during the acute phase of infection. The lowest concentration of SARS-CoV-2 viral copies this assay can detect is 138 copies/mL. A negative result does not preclude SARS-Cov-2 infection and should not be used as the sole basis for treatment or other patient management decisions. A negative result may occur with  improper specimen collection/handling, submission of specimen other than nasopharyngeal swab, presence of viral mutation(s) within the areas targeted by this assay, and inadequate number of viral copies(<138 copies/mL). A negative result must be combined with clinical observations, patient history, and epidemiological information. The expected result is Negative.  Fact Sheet for Patients:  BloggerCourse.com  Fact Sheet for Healthcare Providers:  SeriousBroker.it  This test is no t yet approved or cleared by the [place] FDA and  has been authorized for detection and/or diagnosis of SARS-CoV-2 by FDA under an Emergency Use Authorization (EUA). This EUA will remain  in effect (meaning this test can be used) for the duration of the COVID-19 declaration under Section 564(b)(1) of the Act, 21 U.S.C.section 360bbb-3(b)(1), unless the authorization is terminated  or revoked sooner.       Influenza A by PCR NEGATIVE NEGATIVE Final   Influenza B by PCR NEGATIVE NEGATIVE Final    Comment: (NOTE) The Xpert Xpress SARS-CoV-2/FLU/RSV plus assay is intended as an aid in the diagnosis of influenza from Nasopharyngeal swab specimens and should not be used as a sole basis for treatment. Nasal washings and aspirates are unacceptable for Xpert Xpress SARS-CoV-2/FLU/RSV testing.  Fact  Sheet for Patients: BloggerCourse.com  Fact Sheet for Healthcare Providers: SeriousBroker.it  This test is not yet approved or cleared by the [place] FDA and has been authorized for detection and/or diagnosis of SARS-CoV-2 by FDA under an Emergency Use Authorization (EUA). This EUA will remain in effect (meaning this test can be used) for the duration of the COVID-19 declaration under Section 564(b)(1) of the Act, 21 U.S.C. section 360bbb-3(b)(1), unless the authorization is terminated or revoked.  Performed at [facility], [address], [place], [place] [address]          Radiology Studies: CT Head Wo Contrast  Result Date: 09/16/2020 CLINICAL DATA:  Altered mental status. EXAM: CT HEAD WITHOUT CONTRAST TECHNIQUE: Contiguous axial images were obtained from the base of the skull through the vertex without intravenous contrast. COMPARISON:  Head CT 01/15/2020 FINDINGS: Brain: Stable  degree of atrophy and chronic small vessel ischemia from prior exam. No intracranial hemorrhage, mass effect, or midline shift. No hydrocephalus. The basilar cisterns are patent. No evidence of territorial infarct or acute ischemia. No extra-axial or intracranial fluid collection. Vascular: Atherosclerosis of skullbase vasculature without hyperdense vessel or abnormal calcification. Skull: No fracture or focal lesion. Sinuses/Orbits: No acute findings. Remote left orbital fracture. Bilateral cataract resection. Similar opacification of lower right mastoid air cells. Paranasal sinuses are clear. Other: None. IMPRESSION: 1. No acute intracranial abnormality. 2. Stable atrophy and chronic small vessel ischemia. Electronically Signed   By: Narda Rutherford M.D.   On: 09/16/2020 15:08   DG Chest Portable 1 View  Result Date: 09/16/2020 CLINICAL DATA:  Altered mental status EXAM: PORTABLE CHEST 1 VIEW COMPARISON:  03/23/2016 FINDINGS:  Cardiomediastinal silhouette and pulmonary vasculature are within normal limits. Focal opacity in the right lung base may be atelectasis or pneumonia. IMPRESSION: Focal opacity in the right medial lung base may be due to atelectasis or pneumonia. Electronically Signed   By: Acquanetta Belling M.D.   On: 09/16/2020 14:52        Scheduled Meds: . aspirin EC  81 mg Oral Daily  . enoxaparin (LOVENOX) injection  40 mg Subcutaneous Q24H  . escitalopram  5 mg Oral Daily  . loratadine  10 mg Oral Daily  . mometasone-formoterol  2 puff Inhalation BID  . montelukast  10 mg Oral QHS  . tamsulosin  0.4 mg Oral QPC supper  . vitamin B-12  1,000 mcg Oral Daily   Continuous Infusions: . azithromycin    . cefTRIAXone (ROCEPHIN)  IV    . lactated ringers 100 mL/hr at 09/17/20 0317     LOS: 1 day    Time spent: 25 minutes    Tresa Moore, MD [facility] Pager [phone]  If 7PM-7AM, please contact night-coverage 09/17/2020, 12:44 PM

## 2020-09-17 NOTE — Consult Note (Addendum)
Consultation Note Date: 09/17/2020   Patient Name: XINYI BATTON  DOB: November 19, 1935  MRN: [id number]  Age / Sex: 85 y.o., female  PCP: Tracie Harrier, MD Referring Physician: Sidney Ace, MD  Reason for Consultation: Establishing goals of care and Psychosocial/spiritual support  HPI/Patient Profile: 85 y.o. female  with past medical history of sinopulmonary chronic respiratory oxygen, examined, anxiety, arthritis, seasonal allergy, history of spinal surgery, abdominal hysterectomy, lives alone admitted on 09/16/2020 with community-acquired pneumonia.   Clinical Assessment and Goals of Care: I have reviewed medical records including EPIC notes, labs and imaging, examined the patient and met at bedside with Ms. Harty to discuss diagnosis prognosis, GOC, EOL wishes, disposition and options.  Arline is sitting up in bed with her meal tray in front of her.  She greets me making and mostly keeping eye contact.  She is alert and oriented x2, able to make her needs known.  Call to brother, Michalina Calbert to discuss [place]. I introduced Palliative Medicine as specialized medical care for people living with serious illness. It focuses on providing relief from the symptoms and stress of a serious illness.    We discussed a brief life review of the patient.  [name] shares that Eiliana was never married, has no children.  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  She lives in her own condo, with no need for yard work.  She has not been driving, and [name] gets her groceries/supplies.  She also has Meals on Wheels, and [place] and neighbors take her meals.  She has been independent with ADLs.  [name] manages her bills.  We discussed her current illness and what it means in the larger context of her on-going co-morbidities.  We talked about community-acquired pneumonia, the treatment plan, expected outcomes.  Palliative  Care services outpatient were explained and offered.  [name] states that they would be agreeable to outpatient palliative services.  In-house [place] care representative notified.  Questions and concerns were addressed.  The family was encouraged to call with questions or concerns.   Conference with attending, bedside nursing staff, transition of care team related to patient condition, needs, goals of care.   HCPOA   NEXT OF KIN - Brother, [employer].  Ms. Valent never married, had no children.    SUMMARY OF RECOMMENDATIONS   Continue to treat the treatable but no CPR or intubation Agreeable to outpatient home health if qualified. Agreeable to outpatient palliative services to follow.  Code Status/Advance Care Planning:  DNR  Symptom Management:   Per hospitalist, no additional needs at this time.   Palliative Prophylaxis:   Oral Care  Additional Recommendations (Limitations, Scope, Preferences):  treat the treatable, but no CPR or intubation   Psycho-social/Spiritual:   Desire for further Chaplaincy support:no  Additional Recommendations: Caregiving  Support/Resources and Education on Hospice  Prognosis:   Unable to determine, based on outcomes.  6 months or more would not be surprising based on chronic illness burden, functional status.   Discharge Planning: to be determined,  based on outcomes and recommendations.       Primary Diagnoses: Present on Admission: . CAP (community acquired pneumonia)   I have reviewed the medical record, interviewed the patient and family, and examined the patient. The following aspects are pertinent.  Past Medical History:  Diagnosis Date  . Allergy   . Anxiety   . Arthritis   . COPD (chronic obstructive pulmonary disease) ([place])   . Hyperlipidemia   . Hypertension   . Oxygen deficiency    Social History   Socioeconomic History  . Marital status: Single    Spouse name: Not on file  . Number of children: Not on file  .  Years of education: Not on file  . Highest education level: Not on file  Occupational History  . Not on file  Tobacco Use  . Smoking status: Former Smoker    Packs/day: 1.50    Years: 30.00    Pack years: 45.00    Types: Cigarettes    Quit date: 10/06/2014    Years since quitting: 5.9  . Smokeless tobacco: Never Used  Vaping Use  . Vaping Use: Never used  Substance and Sexual Activity  . Alcohol use: No  . Drug use: No  . Sexual activity: Not on file  Other Topics Concern  . Not on file  Social History Narrative  . Not on file   Social Determinants of Health   Financial Resource Strain: Not on file  Food Insecurity: Not on file  Transportation Needs: Not on file  Physical Activity: Not on file  Stress: Not on file  Social Connections: Not on file   Family History  Problem Relation Age of Onset  . Breast cancer Maternal Aunt   . Heart disease Mother   . ALS Father   . Heart disease Father   . Lymphoma Brother    Scheduled Meds: . aspirin EC  81 mg Oral Daily  . enoxaparin (LOVENOX) injection  40 mg Subcutaneous Q24H  . escitalopram  5 mg Oral Daily  . loratadine  10 mg Oral Daily  . metoprolol succinate  25 mg Oral Daily  . mometasone-formoterol  2 puff Inhalation BID  . montelukast  10 mg Oral QHS  . tamsulosin  0.4 mg Oral QPC supper  . vitamin B-12  1,000 mcg Oral Daily   Continuous Infusions: . azithromycin    . cefTRIAXone (ROCEPHIN)  IV    . lactated ringers 100 mL/hr at 09/17/20 0317   PRN Meds:.acetaminophen **OR** acetaminophen, albuterol, albuterol, docusate sodium, hydrALAZINE, ondansetron **OR** ondansetron (ZOFRAN) IV, oxyCODONE Medications Prior to Admission:  Prior to Admission medications   Medication Sig Start Date End Date Taking? Authorizing Provider  albuterol (PROVENTIL HFA;VENTOLIN HFA) 108 (90 Base) MCG/ACT inhaler Inhale 2 puffs into the lungs every 6 (six) hours as needed for wheezing or shortness of breath. 03/23/16   Earleen Newport, MD  aspirin (ASPIRIN EC) 81 MG EC tablet Take 81 mg by mouth daily. Swallow whole.    [provider]  baclofen (LIORESAL) 10 MG tablet Take 10 mg by mouth 3 (three) times daily.    [provider]  CALCIUM CARBONATE PO Take 1 tablet by mouth daily.    [provider]  cetirizine (ZYRTEC) 10 MG tablet Take 10 mg by mouth daily.    [provider]  Cholecalciferol (VITAMIN D3) 1000 units CAPS Take 1 capsule by mouth daily.    [provider]  diazepam (VALIUM) 5 MG tablet Take  5 mg by mouth at bedtime. Patient takes once at night Reported on 10/16/2015    [provider]  docusate sodium (COLACE) 100 MG capsule Take 100 mg by mouth 2 (two) times daily as needed for mild constipation.    [provider]  escitalopram (LEXAPRO) 5 MG tablet Take 10 mg by mouth daily.     [provider]  Fluticasone-Salmeterol (ADVAIR) 250-50 MCG/DOSE AEPB Inhale 1 puff into the lungs 2 (two) times daily.    [provider]  gabapentin (NEURONTIN) 300 MG capsule Take 300 mg by mouth 3 (three) times daily.    [provider]  HYDROcodone-acetaminophen (NORCO/VICODIN) 5-325 MG tablet Take 1 tablet by mouth 5 (five) times daily as needed for moderate pain. Do not exceed 5 times daily, patient at least 3 day    [provider]  losartan (COZAAR) 50 MG tablet Take 50 mg by mouth daily.    [provider]  lovastatin (MEVACOR) 20 MG tablet Take 20 mg by mouth at bedtime. Reported on 12/12/2015    [provider]  metoprolol succinate (TOPROL-XL) 25 MG 24 hr tablet Take 25 mg by mouth daily.    [provider]  montelukast (SINGULAIR) 10 MG tablet Take 10 mg by mouth at bedtime. Reported on 12/12/2015    [provider]  tamsulosin (FLOMAX) 0.4 MG CAPS capsule Take 0.4 mg by mouth daily after supper.    [provider]  vitamin B-12 (CYANOCOBALAMIN) 1000 MCG tablet Take 1,000  mcg by mouth daily.    [provider]   Allergies  Allergen Reactions  . Codeine Itching  . Penicillins Other (See Comments)    Patient unsure  . Sulfa Antibiotics Itching   Review of Systems  Unable to perform ROS: Age    Physical Exam Vitals and nursing note reviewed.  Constitutional:      General: She is not in acute distress.    Appearance: Normal appearance.  HENT:     Head: Atraumatic.     Mouth/Throat:     Mouth: Mucous membranes are moist.  Cardiovascular:     Rate and Rhythm: Normal rate.  Pulmonary:     Effort: Pulmonary effort is normal. No respiratory distress.  Skin:    General: Skin is warm and dry.  Neurological:     Mental Status: She is alert.     Comments: Oriented to person and place, not time   Psychiatric:     Comments: Calm and cooperative      Vital Signs: BP (!) 121/59 (BP Location: Left Arm)   Pulse 63   Temp 98 F (36.7 C) (Oral)   Resp 16   Ht 5' 5"  (1.651 m)   Wt 59 kg   SpO2 97%   BMI 21.63 kg/m  Pain Scale: 0-10   Pain Score: 0-No pain   SpO2: SpO2: 97 % O2 Device:SpO2: 97 % O2 Flow Rate: .O2 Flow Rate (L/min): 2 L/min  IO: Intake/output summary:   Intake/Output Summary (Last 24 hours) at 09/17/2020 1503 Last data filed at 09/17/2020 0945 Gross per 24 hour  Intake 120 ml  Output 400 ml  Net -280 ml    LBM:   Baseline Weight: Weight: 59 kg Most recent weight: Weight: 59 kg     Palliative Assessment/Data:   Flowsheet Rows   Flowsheet Row Most Recent Value  Intake Tab   Referral Department Hospitalist  Unit at Time of Referral Med/Surg Unit  Palliative Care Primary Diagnosis  Pulmonary  Date Notified 09/16/20  Palliative Care Type New Palliative care  Reason for referral Clarify Goals of Care  Date of Admission 09/16/20  Date first seen by Palliative Care 09/17/20  # of days Palliative referral response time 1 Day(s)  # of days IP prior to Palliative referral 0  Clinical Assessment   Palliative  Performance Scale Score 50%  Pain Max last 24 hours Not able to report  Pain Min Last 24 hours Not able to report  Dyspnea Max Last 24 Hours Not able to report  Dyspnea Min Last 24 hours Not able to report  Psychosocial & Spiritual Assessment   Palliative Care Outcomes       Time In: 1430  Time Out: 1540 Time Total: 70 minutes  Greater than 50%  of this time was spent counseling and coordinating care related to the above assessment and plan.  Signed by: Drue Novel, NP   Please contact Palliative Medicine Team phone at [phone] for questions and concerns.  For individual provider: See Shea Evans

## 2020-09-17 NOTE — Progress Notes (Signed)
PHARMACIST - PHYSICIAN COMMUNICATION  CONCERNING:  Enoxaparin (Lovenox) for DVT Prophylaxis    RECOMMENDATION: Patient was prescribed enoxaparin 30mg  q24 hours for VTE prophylaxis.   Filed Weights   09/16/20 1240  Weight: 59 kg (130 lb)    Body mass index is 21.63 kg/m.  Estimated Creatinine Clearance: 37.2 mL/min (A) (by C-G formula based on SCr of 1.05 mg/dL (H)).  Patient is candidate for enoxaparin 40mg  every 24 hours based on CrCl >76ml/min and Weight >45kg  DESCRIPTION: Pharmacy has adjusted enoxaparin dose per [facility] policy.  Patient is now receiving enoxaparin 40 mg every 24 hours    31m 09/17/2020 7:42 AM

## 2020-09-17 NOTE — Progress Notes (Signed)
Mobility Specialist - Progress Note   09/17/20 1200  Mobility  Activity Ambulated in room;Ambulated to bathroom  Level of Assistance Minimal assist, patient does 75% or more  Assistive Device Front wheel walker  Distance Ambulated (ft) 45 ft  Mobility Response Tolerated well  Mobility performed by Mobility specialist  $Mobility charge 1 Mobility     Pre-mobility: 74 HR, 90% SpO2 During mobility: 86 HR, 89% SpO2   Pt sat at 89% on arrival utilizing 1.5L O2. Set on 2L portable O2 with no change in sats. Increased O2 to 3L prior to activity, sat up to 92%. Initial complaint of back pain "1/10". Pt modI in all transfers this date. Dizzy while EOB that was resolved. MinA for ambulation, but did progress to supervision at the end of trial. Denied SOB. C/o R hip pain during ambulation that did go away once returned to seated position. Confused. VC for hand placement/sequencing. Carried over commands well. Family at bedside beginning of session. Left on 1.5L.    Filiberto Pinks Mobility Specialist 09/17/20, 12:13 PM

## 2020-09-17 NOTE — Progress Notes (Signed)
[facility] Room 249 [facility]) Hospital Liaison RN note:  Received new referral for [facility] out patient palliative program to follow post discharge from Lillia Carmel, NP. Patient information given to referral. Bridgett Cobb, TOC notified. Plan is to discharge home once medically stable. [facility] Liaison will follow for disposition.  Thank you for this referral.  Cyndra Numbers, RN [facility] Liaison [phone]

## 2020-09-17 NOTE — Evaluation (Signed)
Occupational Therapy Evaluation Patient Details Name: Bethany [name] MRN: [id number] DOB: 09-25-1935 Today's Date: 09/17/2020    History of Present Illness Patient is an 85 y.o. female with medical history significant of COPD, chronic respiratory failure on 2 L, hypertension, hyperlipidemia who presents to the emergency department from home for evaluation of lethargy and altered mentation over the past several days. Patient lives alone and is on chronic opioid therapy. Admitted with community-acquired pneumonia, chronic hypoxic respiratory failure, acute metabolic encephalopathy.  Encephalopathy appears to be due to dehydration, poor p.o. intake, excessive sedative medication use per notes.   Clinical Impression   Pt was seen for OT evaluation this date. Prior to hospital admission, pt was living by herself and relatively independent with basic ADL, simple meal prep using microwave (also had Meals on Wheels), and medication mgt. Brother verified that he provides transportation and groceries, and family checks in on pt several times per week. At start of session, brother not present. Pt pleasant and agreeable to OT. Pill box test performed to assess cognition. Pt demo'd significant difficulty with sequencing to open fake Rx bottles and properly sort pills into the pill box (Sun-Sat, morning, noon, evening, bed) based on varied scheduled for when to take them (e.g., take 1 tablet by mouth 2 times per day). Pt required significant continous/repeated simple verbal cues to complete each medication as well as intermittent visual cues. Easily distracted. Brother came into session near the end, verified that this is not the patient's normal. Pt was alert and oriented x1 during session, requiring cues to reorient, demonstrating impaired immediate and delayed recall. Pt high risk of falls and at increased risk for medication misuse 2/2 cognitive deficits at this time in addition to strength and balance deficits  noted. Strongly encouraged pt/brother to have family assist with medication management to ensure adherence and minimize risk of accidental misuse. Pt would benefit from skilled OT services to address noted impairments and functional limitations (see below for any additional details) in order to maximize safety and independence while minimizing falls risk and caregiver burden. Upon hospital discharge, recommend HHOT to maximize pt safety and return to functional independence during meaningful occupations of daily life.     Follow Up Recommendations  Home health OT;Other (comment) (supervision for mobility, bathing, meal prep, and medication management)    Equipment Recommendations  None recommended by OT    Recommendations for Other Services       Precautions / Restrictions Precautions Precautions: Fall Restrictions Weight Bearing Restrictions: No      Mobility Bed Mobility Overal bed mobility: Modified Independent                  Transfers Overall transfer level: Needs assistance Equipment used: Rolling walker (2 wheeled) Transfers: Sit to/from Stand Sit to Stand: Supervision         General transfer comment: verbal cues for hand placement for safety    Balance Overall balance assessment: Needs assistance Sitting-balance support: Feet supported;Feet unsupported Sitting balance-Leahy Scale: Good     Standing balance support: Bilateral upper extremity supported Standing balance-Leahy Scale: Fair Standing balance comment: patient relying on rolling walker for support in standing                           ADL either performed or assessed with clinical judgement   ADL Overall ADL's : Needs assistance/impaired  General ADL Comments: Pt requires PRN MIN A for LB ADL in sitting, CGA for ADL transfers     Vision Baseline Vision/History: Wears glasses Wears Glasses: Reading only (doesn't have with  her) Patient Visual Report: No change from baseline       Perception     Praxis      Pertinent Vitals/Pain Pain Assessment: Faces Faces Pain Scale: Hurts little more Pain Location: back with prolonged unsupported sitting EOB Pain Descriptors / Indicators: Aching Pain Intervention(s): Limited activity within patient's tolerance;Monitored during session;Repositioned     Hand Dominance Right   Extremity/Trunk Assessment Upper Extremity Assessment Upper Extremity Assessment: Overall WFL for tasks assessed   Lower Extremity Assessment Lower Extremity Assessment: Overall WFL for tasks assessed       Communication Communication Communication: No difficulties   Cognition Arousal/Alertness: Awake/alert Behavior During Therapy: WFL for tasks assessed/performed Overall Cognitive Status: Impaired/Different from baseline Area of Impairment: Orientation;Following commands;Memory;Safety/judgement;Problem solving                 Orientation Level: Disoriented to;Place;Time;Situation   Memory: Decreased short-term memory Following Commands: Follows one step commands inconsistently Safety/Judgement: Decreased awareness of deficits;Decreased awareness of safety   Problem Solving: Slow processing;Difficulty sequencing;Requires verbal cues General Comments: Demonstrated significant difficulty with pill box test; pt even asked brother if he lived with her or with his wife   General Comments       Exercises  Other Exercises: Pt instructed in pill box test - pt demo'd significant difficulty with sequencing to open fake Rx bottles and properly sorting pills into the pill box (Sun-Sat, morning, noon, evening, bed), requiring continous/repeated simple verbal cues to complete. Easily distracted. Per brother this is not her baseline   Shoulder Instructions      Home Living Family/patient expects to be discharged to:: Private residence Living Arrangements:  (patient states she lives  with brother, chart state she lives alone) Available Help at Discharge: Family;Available PRN/intermittently Type of Home: House Home Access: Stairs to enter [employer] of Steps: 2 Entrance Stairs-Rails: Can reach both Home Layout: One level     Bathroom Shower/Tub: Chief Strategy Officer: Standard     Home Equipment: Cane - single point;Shower seat;Grab bars - tub/shower   Additional Comments: information is per patient report; brother entered OT evaluation and verified/corrected some information      Prior Functioning/Environment Level of Independence: Independent;Needs assistance  Gait / Transfers Assistance Needed: patient reports she is independent with mobility without assistive device at baseline. patient is on 2 L02 all the time per her report - brother verifies this ADL's / Homemaking Assistance Needed: patient reports she is independent, has a shower chair but does not use it. sometimes has shortness of breath with ADL activity; pt also reports being independent with med mgt and driving; brother reports she is indep with med mgt, but does not drive - brother has been driving her when needed and also gets groceries for pt, pt able to prepare simple microwave meals (does not use stove 2/2 home O2) and participates in Meals on Wheels   Comments: Pt endorses falls but unsure how many        OT Problem List: Pain;Decreased strength;Decreased cognition;Decreased safety awareness;Decreased activity tolerance;Impaired balance (sitting and/or standing);Decreased knowledge of use of DME or AE      OT Treatment/Interventions: Self-care/ADL training;Therapeutic exercise;Therapeutic activities;Cognitive remediation/compensation;DME and/or AE instruction;Energy conservation;Patient/family education;Balance training    OT Goals(Current goals can be found in the care plan section)  Acute Rehab OT Goals Patient Stated Goal: to return home OT Goal Formulation: With  patient/family Time For Goal Achievement: 10/01/20 Potential to Achieve Goals: Good ADL Goals Additional ADL Goal #1: Pt will complete pill box test with caregiver supervision/assist and minimal VC for errors to maximize safety. Additional ADL Goal #2: Pt will perform daily morning ADL routine with supervision assist for safety.  OT Frequency: Min 2X/week   Barriers to D/C:            Co-evaluation              AM-PAC OT "6 Clicks" Daily Activity     Outcome Measure Help from another person eating meals?: None Help from another person taking care of personal grooming?: A Little Help from another person toileting, which includes using toliet, bedpan, or urinal?: A Little Help from another person bathing (including washing, rinsing, drying)?: A Little Help from another person to put on and taking off regular upper body clothing?: None Help from another person to put on and taking off regular lower body clothing?: A Little 6 Click Score: 20   End of Session    Activity Tolerance: Patient tolerated treatment well Patient left: in bed;with call bell/phone within reach;with bed alarm set;with family/visitor present  OT Visit Diagnosis: Other abnormalities of gait and mobility (R26.89);Repeated falls (R29.6);Muscle weakness (generalized) (M62.81);Other symptoms and signs involving cognitive function                Time: 5621-3086 OT Time Calculation (min): 47 min Charges:  OT General Charges $OT Visit: 1 Visit OT Evaluation $OT Eval Moderate Complexity: 1 Mod OT Treatments $Self Care/Home Management : 23-37 mins $Cognitive Funtion inital: Initial 15 mins  Wynona Canes, MPH, MS, OTR/L ascom [phone] 09/17/20, 5:04 PM

## 2020-09-18 DIAGNOSIS — Z7189 Other specified counseling: Secondary | ICD-10-CM

## 2020-09-18 DIAGNOSIS — E440 Moderate protein-calorie malnutrition: Secondary | ICD-10-CM | POA: Insufficient documentation

## 2020-09-18 MED ORDER — GABAPENTIN 300 MG PO CAPS: 300.0000 mg | ORAL_CAPSULE | Freq: Two times a day (BID) | ORAL | Status: DC

## 2020-09-18 MED ORDER — AZITHROMYCIN 500 MG PO TABS: 500.0000 mg | ORAL_TABLET | Freq: Every day | ORAL | 0 refills | Status: AC

## 2020-09-18 MED ORDER — CEFPODOXIME PROXETIL 200 MG PO TABS: 200.0000 mg | ORAL_TABLET | Freq: Two times a day (BID) | ORAL | 0 refills | Status: AC

## 2020-09-18 MED ORDER — AZITHROMYCIN 500 MG PO TABS: 500.0000 mg | ORAL_TABLET | Freq: Every day | ORAL | 0 refills | Status: DC

## 2020-09-18 MED ORDER — CEFPODOXIME PROXETIL 200 MG PO TABS: 200.0000 mg | ORAL_TABLET | Freq: Two times a day (BID) | ORAL | 0 refills | Status: DC

## 2020-09-18 NOTE — Progress Notes (Incomplete)
Patient discharged per orders. PIV/tele removed from patient. AVS reviewed; patient/brother expressed understanding. Patient taken to main entrance by nursing.

## 2020-09-18 NOTE — Progress Notes (Signed)
Palliative:    [name] [name], [name], is sitting up on the edge of the bed.  She greets me, making and mostly keeping eye contact.  Bethany [name] [name] is alert and oriented x3, able to make her basic needs known.  Nursing student is at bedside assisting her to the toilet.    We talked about what brought her into the hospital, pneumonia, and the treatment plan.  We talked about continued antibiotics at home.  I share with Bethany [name] [name] that I spoke with her brother Bethany [name] [name] yesterday.  Bethany [name] [name] shares, "isn't he a great brother".  I share that indeed, Bethany [name] [name] seems to be a big help for her.  We talked about expected discharge today, and outpatient palliative services to follow.  I share that Bethany [name] [name] is aware of her needs, and is expecting phone calls to set up appointments.  Conference with attending, bedside nursing staff, transition care team related to patient condition, needs, goals of care.  Outpatient palliative ACP updated.  Plan:   Continue to treat the treatable but no CPR or intubation.  At this point, rehospitalize as needed.  Outpatient palliative services to follow.  25 minutes  Bethany [name] Carmel, NP Palliative medicine Team  Tem phone [phone] Greater than 50% of time was spent counseling and coordinating care related to the above assessment and plan.

## 2020-09-18 NOTE — Progress Notes (Signed)
Mobility Specialist - Progress Note   09/18/20 1200  Mobility  Activity Ambulated in hall  Level of Assistance Minimal assist, patient does 75% or more  Assistive Device Front wheel walker  Distance Ambulated (ft) 80 ft  Mobility Response Tolerated well  Mobility performed by Mobility specialist  $Mobility charge 1 Mobility    Pre-mobility: 88 HR, 97% SpO2 During mobility: 114 HR, 86% SpO2 Post-mobility: 106 HR, 96% SpO2   Pt ambulated in hallway with RW. ModI bed mobility. MinA while ambulating. Initially denied dizziness, but developed mild dizziness during ambulation. No LOB. Pt c/o SOB, O2 desat to a low of 86% on 2L. PLB engaged. O2 immediately increased to high 90s once seated, dizziness resolved. RPE 7/10. Aox3. Reports feeling really good today.    Filiberto Pinks Mobility Specialist 09/18/20, 12:40 PM

## 2020-09-18 NOTE — TOC Transition Note (Signed)
Transition of Care [facility]) - CM/SW Discharge Note   Patient Details  Name: Bethany [name] MRN: [id number] Date of Birth: [birth year]  Transition of Care [facility]) CM/SW Contact:  Hetty Ely, RN Phone Number: 09/18/2020, 10:58 AM   Clinical Narrative:  Patient scheduled for  Discharge today, spoke with brother who will transport home. Outpatient PT/OT referral faxed to [facility], per patients choice. Referral and list given to patient. TOC barriers resolved.    Final next level of care: Home w Home Health Services Barriers to Discharge: Barriers Resolved   Patient Goals and CMS Choice Patient states their goals for this hospitalization and ongoing recovery are:: Lives with brother want to return.   Choice offered to / list presented to : NA  Discharge Placement                       Discharge Plan and Services     Post Acute Care Choice: Home Health (Outpatient PT/OT)          DME Arranged: N/A DME Agency: NA       HH Arranged: PT,OT HH Agency: Other - See comment (Outpatient [facility] on campus.)     Representative spoke with at [facility] Agency: Referral faxed to [facility] [phone]  Social Determinants of Health (SDOH) Interventions     Readmission Risk Interventions No flowsheet data found.

## 2020-09-18 NOTE — Discharge Summary (Signed)
Physician Discharge Summary  Bethany [name] URK:[id number] DOB: 03/09/1936 DOA: 09/16/2020  PCP: Barbette Reichmann, MD  Admit date: 09/16/2020 Discharge date: 09/18/2020  Discharge disposition: Home   Recommendations for Outpatient Follow-Up:   Follow-up with PCP for routine health maintenance Outpatient physical and occupational therapy (patient prefers this to home health therapy)   Discharge Diagnosis:   Active Problems:   CAP (community acquired pneumonia)   Malnutrition of moderate degree    Discharge Condition: Stable.  Diet recommendation:  Diet Order            Diet - low sodium heart healthy           Diet regular Room service appropriate? Yes; Fluid consistency: Thin  Diet effective now                   Code Status: DNR     Hospital Course:   [name] [name] is an 85 year old woman with medical history significant for COPD, chronic hypoxic respiratory failure on 2 L/min oxygen, chronic opioid therapy for chronic pain, hypertension, hyperlipidemia, who presented to the hospital because of altered mental status (lethargy).  She was admitted to the hospital for community-acquired pneumonia complicated by acute metabolic encephalopathy.  She was treated with empiric IV antibiotics and IV fluids.  Her condition has improved.  Mental status is back to baseline.  She was able to ambulate in the hallway with a walker.  She is deemed stable for discharge to home today.  Discharge plan was discussed with the patient and she is agreeable with the plan.  Her nurse, Brantley Persons, was present at the bedside during this encounter.    Discharge Exam:    Vitals:   09/18/20 0500 09/18/20 0503 09/18/20 0634 09/18/20 0726  BP:  (!) 173/75 (!) 174/75 (!) 164/86  Pulse:    79  Resp:    18  Temp:    97.8 F (36.6 C)  TempSrc:    Oral  SpO2:    98%  Weight: 63.2 kg     Height:         GEN: NAD SKIN: Warm and dry EYES: EOMI ENT: MMM CV: RRR PULM: CTA B ABD: soft,  ND, NT, +BS CNS: AAO x 3, non focal EXT: No edema or tenderness   The results of significant diagnostics from this hospitalization (including imaging, microbiology, ancillary and laboratory) are listed below for reference.     Procedures and Diagnostic Studies:   CT Head Wo Contrast  Result Date: 09/16/2020 CLINICAL DATA:  Altered mental status. EXAM: CT HEAD WITHOUT CONTRAST TECHNIQUE: Contiguous axial images were obtained from the base of the skull through the vertex without intravenous contrast. COMPARISON:  Head CT 01/15/2020 FINDINGS: Brain: Stable degree of atrophy and chronic small vessel ischemia from prior exam. No intracranial hemorrhage, mass effect, or midline shift. No hydrocephalus. The basilar cisterns are patent. No evidence of territorial infarct or acute ischemia. No extra-axial or intracranial fluid collection. Vascular: Atherosclerosis of skullbase vasculature without hyperdense vessel or abnormal calcification. Skull: No fracture or focal lesion. Sinuses/Orbits: No acute findings. Remote left orbital fracture. Bilateral cataract resection. Similar opacification of lower right mastoid air cells. Paranasal sinuses are clear. Other: None. IMPRESSION: 1. No acute intracranial abnormality. 2. Stable atrophy and chronic small vessel ischemia. Electronically Signed   By: Narda Rutherford M.D.   On: 09/16/2020 15:08   DG Chest Portable 1 View  Result Date: 09/16/2020 CLINICAL DATA:  Altered mental status EXAM: PORTABLE CHEST  1 VIEW COMPARISON:  03/23/2016 FINDINGS: Cardiomediastinal silhouette and pulmonary vasculature are within normal limits. Focal opacity in the right lung base may be atelectasis or pneumonia. IMPRESSION: Focal opacity in the right medial lung base may be due to atelectasis or pneumonia. Electronically Signed   By: Acquanetta BellingFarhaan  Mir M.D.   On: 09/16/2020 14:52     Labs:   Basic Metabolic Panel: Recent Labs  Lab 09/16/20 1253 09/17/20 0447  NA 135 141  K 4.5 3.9   CL 101 110  CO2 23 25  GLUCOSE 147* 81  BUN 40* 28*  CREATININE 1.76* 1.05*  CALCIUM 9.4 8.9   GFR Estimated Creatinine Clearance: 37.2 mL/min (A) (by C-G formula based on SCr of 1.05 mg/dL (H)). Liver Function Tests: Recent Labs  Lab 09/16/20 1253  AST 27  ALT 11  ALKPHOS 51  BILITOT 1.1  PROT 6.7  ALBUMIN 3.9   No results for input(s): LIPASE, AMYLASE in the last 168 hours. No results for input(s): AMMONIA in the last 168 hours. Coagulation profile No results for input(s): INR, PROTIME in the last 168 hours.  CBC: Recent Labs  Lab 09/16/20 1253 09/17/20 0447  WBC 7.8 6.8  HGB 12.0 11.7*  HCT 36.6 35.3*  MCV 97.3 97.8  PLT 218 165   Cardiac Enzymes: No results for input(s): CKTOTAL, CKMB, CKMBINDEX, TROPONINI in the last 168 hours. BNP: Invalid input(s): POCBNP CBG: No results for input(s): GLUCAP in the last 168 hours. D-Dimer No results for input(s): DDIMER in the last 72 hours. Hgb A1c No results for input(s): HGBA1C in the last 72 hours. Lipid Profile No results for input(s): CHOL, HDL, LDLCALC, TRIG, CHOLHDL, LDLDIRECT in the last 72 hours. Thyroid function studies No results for input(s): TSH, T4TOTAL, T3FREE, THYROIDAB in the last 72 hours.  Invalid input(s): FREET3 Anemia work up No results for input(s): VITAMINB12, FOLATE, FERRITIN, TIBC, IRON, RETICCTPCT in the last 72 hours. Microbiology Recent Results (from the past 240 hour(s))  Blood culture (routine x 2)     Status: None (Preliminary result)   Collection Time: 09/16/20  2:55 PM   Specimen: BLOOD  Result Value Ref Range Status   Specimen Description BLOOD BLOOD RIGHT FOREARM  Final   Special Requests   Final    BOTTLES DRAWN AEROBIC AND ANAEROBIC Blood Culture adequate volume   Culture   Final    NO GROWTH 2 DAYS Performed at [facility], [address] [place], [place] [address]    Report Status PENDING  Incomplete  Blood culture (routine x 2)     Status: None  (Preliminary result)   Collection Time: 09/16/20  2:55 PM   Specimen: BLOOD  Result Value Ref Range Status   Specimen Description BLOOD RIGHT ANTECUBITAL  Final   Special Requests   Final    BOTTLES DRAWN AEROBIC AND ANAEROBIC Blood Culture adequate volume   Culture   Final    NO GROWTH 2 DAYS Performed at [facility], [address] [place], [place] [address]    Report Status PENDING  Incomplete  Resp Panel by RT-PCR (Flu A&B, Covid) Nasopharyngeal Swab     Status: None   Collection Time: 09/16/20  3:39 PM   Specimen: Nasopharyngeal Swab; Nasopharyngeal(NP) swabs in vial transport medium  Result Value Ref Range Status   SARS Coronavirus 2 by RT PCR NEGATIVE NEGATIVE Final    Comment: (NOTE) SARS-CoV-2 target nucleic acids are NOT DETECTED.  The SARS-CoV-2 RNA is generally detectable in upper respiratory  specimens during the acute phase of infection. The lowest concentration of SARS-CoV-2 viral copies this assay can detect is 138 copies/mL. A negative result does not preclude SARS-Cov-2 infection and should not be used as the sole basis for treatment or other patient management decisions. A negative result may occur with  improper specimen collection/handling, submission of specimen other than nasopharyngeal swab, presence of viral mutation(s) within the areas targeted by this assay, and inadequate number of viral copies(<138 copies/mL). A negative result must be combined with clinical observations, patient history, and epidemiological information. The expected result is Negative.  Fact Sheet for Patients:  BloggerCourse.com  Fact Sheet for Healthcare Providers:  SeriousBroker.it  This test is no t yet approved or cleared by the [place] FDA and  has been authorized for detection and/or diagnosis of SARS-CoV-2 by FDA under an Emergency Use Authorization (EUA). This EUA will remain  in effect (meaning this test  can be used) for the duration of the COVID-19 declaration under Section 564(b)(1) of the Act, 21 U.S.C.section 360bbb-3(b)(1), unless the authorization is terminated  or revoked sooner.       Influenza A by PCR NEGATIVE NEGATIVE Final   Influenza B by PCR NEGATIVE NEGATIVE Final    Comment: (NOTE) The Xpert Xpress SARS-CoV-2/FLU/RSV plus assay is intended as an aid in the diagnosis of influenza from Nasopharyngeal swab specimens and should not be used as a sole basis for treatment. Nasal washings and aspirates are unacceptable for Xpert Xpress SARS-CoV-2/FLU/RSV testing.  Fact Sheet for Patients: BloggerCourse.com  Fact Sheet for Healthcare Providers: SeriousBroker.it  This test is not yet approved or cleared by the [place] FDA and has been authorized for detection and/or diagnosis of SARS-CoV-2 by FDA under an Emergency Use Authorization (EUA). This EUA will remain in effect (meaning this test can be used) for the duration of the COVID-19 declaration under Section 564(b)(1) of the Act, 21 U.S.C. section 360bbb-3(b)(1), unless the authorization is terminated or revoked.  Performed at [facility], [address] [place], [place] [address]      Discharge Instructions:   Discharge Instructions    Ambulatory referral to Occupational Therapy   Complete by: As directed    Ambulatory referral to Physical Therapy   Complete by: As directed    Diet - low sodium heart healthy   Complete by: As directed    Increase activity slowly   Complete by: As directed      Allergies as of 09/18/2020      Reactions   Codeine Itching   Penicillins Other (See Comments)   Patient unsure   Sulfa Antibiotics Itching      Medication List    TAKE these medications   albuterol 108 (90 Base) MCG/ACT inhaler Commonly known as: VENTOLIN HFA Inhale 2 puffs into the lungs every 6 (six) hours as needed for wheezing or  shortness of breath.   aspirin EC 81 MG EC tablet Generic drug: aspirin Take 81 mg by mouth daily. Swallow whole.   azithromycin 500 MG tablet Commonly known as: Zithromax Take 1 tablet (500 mg total) by mouth daily for 2 days. Take 1 tablet daily for 2 days.   baclofen 10 MG tablet Commonly known as: LIORESAL Take 10 mg by mouth 3 (three) times daily.   CALCIUM CARBONATE PO Take 1 tablet by mouth daily.   cefpodoxime 200 MG tablet Commonly known as: VANTIN Take 1 tablet (200 mg total) by mouth 2 (two) times daily for 3 days.  cetirizine 10 MG tablet Commonly known as: ZYRTEC Take 10 mg by mouth daily.   diazepam 5 MG tablet Commonly known as: VALIUM Take 5 mg by mouth at bedtime. Patient takes once at night Reported on 10/16/2015   docusate sodium 100 MG capsule Commonly known as: COLACE Take 100 mg by mouth 2 (two) times daily as needed for mild constipation.   escitalopram 5 MG tablet Commonly known as: LEXAPRO Take 10 mg by mouth daily.   Fluticasone-Salmeterol 250-50 MCG/DOSE Aepb Commonly known as: ADVAIR Inhale 1 puff into the lungs 2 (two) times daily.   gabapentin 300 MG capsule Commonly known as: NEURONTIN Take 1 capsule (300 mg total) by mouth 2 (two) times daily. What changed: when to take this   HYDROcodone-acetaminophen 5-325 MG tablet Commonly known as: NORCO/VICODIN Take 1 tablet by mouth 5 (five) times daily as needed for moderate pain. Do not exceed 5 times daily, patient at least 3 day   losartan 50 MG tablet Commonly known as: COZAAR Take 50 mg by mouth daily.   lovastatin 20 MG tablet Commonly known as: MEVACOR Take 20 mg by mouth at bedtime. Reported on 12/12/2015   metoprolol succinate 25 MG 24 hr tablet Commonly known as: TOPROL-XL Take 25 mg by mouth daily.   montelukast 10 MG tablet Commonly known as: SINGULAIR Take 10 mg by mouth at bedtime. Reported on 12/12/2015   tamsulosin 0.4 MG Caps capsule Commonly known as:  FLOMAX Take 0.4 mg by mouth daily after supper.   vitamin B-12 1000 MCG tablet Commonly known as: CYANOCOBALAMIN Take 1,000 mcg by mouth daily.   Vitamin D3 25 MCG (1000 UT) Caps Take 1 capsule by mouth daily.         Time coordinating discharge: 31 minutes  Signed:  Shamel Germond  [facility] 09/18/2020, 10:51 AM   Pager on [email or web address]. If 7PM-7AM, please contact night-coverage at [email or web address]

## 2020-09-18 NOTE — Plan of Care (Signed)

## 2020-09-21 LAB — CULTURE, BLOOD (ROUTINE X 2)
Culture: NO GROWTH
Culture: NO GROWTH
Special Requests: ADEQUATE
Special Requests: ADEQUATE

## 2020-09-23 ENCOUNTER — Telehealth: Payer: Self-pay

## 2020-09-23 NOTE — Telephone Encounter (Signed)
Spoke with patient's brother Joni Fears and scheduled an in-person Palliative Consult for 10/01/20 @ 12:30PM.  COVID screening was negative. No pets in home. Patient lives alone. They only family she has is Sports administrator. He stated he will try to be at the consult.   Consent obtained; updated Outlook/Netsmart/Team List and Epic.  Family is aware they may be receiving a call from NP the day before or day of to confirm appointment.

## 2020-09-27 DIAGNOSIS — M48062 Spinal stenosis, lumbar region with neurogenic claudication: Secondary | ICD-10-CM | POA: Diagnosis not present

## 2020-09-27 DIAGNOSIS — M5136 Other intervertebral disc degeneration, lumbar region: Secondary | ICD-10-CM | POA: Diagnosis not present

## 2020-09-27 DIAGNOSIS — M5416 Radiculopathy, lumbar region: Secondary | ICD-10-CM | POA: Diagnosis not present

## 2020-10-01 ENCOUNTER — Other Ambulatory Visit: Payer: Medicare HMO | Admitting: Primary Care

## 2020-10-01 ENCOUNTER — Other Ambulatory Visit: Payer: Self-pay

## 2020-10-01 DIAGNOSIS — Z515 Encounter for palliative care: Secondary | ICD-10-CM | POA: Diagnosis not present

## 2020-10-01 DIAGNOSIS — G8929 Other chronic pain: Secondary | ICD-10-CM

## 2020-10-01 DIAGNOSIS — F419 Anxiety disorder, unspecified: Secondary | ICD-10-CM

## 2020-10-01 DIAGNOSIS — M545 Low back pain, unspecified: Secondary | ICD-10-CM | POA: Diagnosis not present

## 2020-10-01 DIAGNOSIS — J449 Chronic obstructive pulmonary disease, unspecified: Secondary | ICD-10-CM | POA: Diagnosis not present

## 2020-10-01 NOTE — Progress Notes (Signed)
[facility] Palliative Care Consult Note Telephone: [phone]  Fax: [phone]    Date of encounter: 10/01/20 PATIENT NAME: [place] [id number]   [phone] (home)  DOB: [birth year] MRN: [id number] PRIMARY CARE PROVIDER:    Tracie Harrier, MD,  [address] [place] [address] [place] PROVIDER:   Tracie Harrier, [place],  [place] [address] [phone]  RESPONSIBLE PARTY:    Contact Information    Name Relation Home Work Mobile   Scullion,[name] P Brother [phone]       I met face to face with patient and family in  home. Palliative Care was asked to follow this patient by consultation request of  Tracie Harrier, MD to address advance care planning and complex medical decision making. This is the initial visit.                                     ASSESSMENT AND PLAN / RECOMMENDATIONS:   Advance Care Planning/Goals of Care: Goals include to maximize quality of life and symptom management. Our advance care planning conversation included a discussion about:     The value and importance of advance care planning   Exploration of personal, cultural or spiritual beliefs that might influence medical decisions   Exploration of goals of care in the event of a sudden injury or illness   Identification and preparation of a healthcare agent   Review  of an  advance directive document- Discussed MOST form questions   CODE STATUS: TBD, wants to discuss more.  We discussed goals of care as well as advanced care plans. Brother is default power of attorney and is on her bank account. He states she was scammed and lost a great deal of money a few years ago. We discussed her advance medical wishes. They had never really discussed in depth as a family, and so were not entirely prepared to prepare paperwork today but we will  continue to discuss.   Patient did say she would not want to be prolonged without possibility of recovery. However we did not create documents today and will continue to discuss. Her brother also has some medical issues and may not be able to attend our meetings but will be available by phone. Brother states her goal would include being in the home as long as possible. I asked them to consider what they'd do if she became unable to stay in her condo RE in home care, facility, etc. They will discuss.   Symptom Management/Plan:  I met with patient for the first time, along  with her brother. She has had a recent hospital stay with debility from hypoxia/COPD.   Dyspnea s/p CAP:  she's currently on supplemental oxygen and has a concentrator in the home, as well as mobile oxygen. She endorses wearing it all the time. She's able to get up and down from sitting to walking, without increased dyspnea, to do some around the house type chores. She is deconditioned however and does fatigue.   Medication management This appears to be questionable. Patient has moderate cognitive impairment and sets up her own pills. She endorses using a four time a day weekly pillbox for two week's worth of medicines. I encouraged them to allow [facility] where she already has her medication to do the pre-packaging.  Her brother thinks this will be a good idea and will proceed.    Safety d/t weakness:  patient was found passed out at home prior to this hospital stay but we did discuss safety and fall prevention. We discussed that she had a cell phone but I am dubious as to whether she could call especially in a compromised state. She no longer has a land line for med alert, but there are cellular options. We discussed technology such as a Visual merchandiser or a [employer] watch with an Librarian, academic. Brother  will look into some sort of safety device, Alexa, etc. Patient has a few neighbors nearby but does not have a reliable alert system  currently.   Fall risk/ deconditioning:  Home health Patient does need home PT/OT and aid. Apparently this may have been prescribed as outpatient from her hospital stay but brother says that he is not able to take her, that she is homebound and it would take a taxing effort for her to go out. Therefore she would be appropriate for home health in-home therapy and aid services. I will try to generate this referral. Many of the agencies have a long waiting list, which was explained to family.  Caregiver strain: Pt lives alone with brother looking in on her. He has some health issues as well but does what he can to assist her. She has some neighbors who also check on her. She endorses having some struggle to do bathing and some house work. Suggested hiring someone to assist with chores/caregiving several days a week.  Follow up Palliative Care Visit: Palliative care will continue to follow for complex medical decision making, advance care planning, and clarification of goals. Return 4 weeks or prn.  I spent 75 minutes providing this consultation. More than 50% of the time in this consultation was spent in counseling and care coordination.  PPS: 40%  HOSPICE ELIGIBILITY/DIAGNOSIS: TBD  Chief Complaint: deconditioning, fall risk, dyspnea  HISTORY OF PRESENT ILLNESS:  Bethany [name] is a 85 y.o. year old female  with COPD, recent  CAP and debility from this.  Presents with some cognitive impairment, weakness and fatigue. She is able to do some adls but needs assistance for safety and cuing for cognition. She has oxygen for dyspnea in place.  History obtained from review of EMR, discussion with primary team, and interview with family, facility staff/caregiver and/or Bethany [name].  I reviewed available labs, medications, imaging, studies and related documents from the EMR.  Records reviewed and summarized above.   ROS General: NAD Cardiovascular: denies chest pain, denies DOE Pulmonary: denies cough,  denies increased SOB Abdomen: endorses good appetite, denies constipation, endorses continence of bowel GU: denies dysuria, endorses continence of urine MSK:  endorses weakness,  no falls reported Skin: denies rashes or wounds Neurological: denies pain, denies insomnia Psych: Endorses positive mood Heme/lymph/immuno: denies bruises, abnormal bleeding  Physical Exam: Current and past weights: 135 lbs reported  Constitutional: NAD General: frail appearing, thin EYES: anicteric sclera, lids intact, no discharge  ENMT: intact hearing, oral mucous membranes moist, dentition intact CV:  no LE edema Pulmonary:  no increased work of breathing, no cough, supplemental oxygen Abdomen:  intake 100%,  no ascites MSK: moderate sarcopenia, moves all extremities, ambulatory Skin: warm and dry, no rashes or wounds on visible skin Neuro:  + generalized weakness,  moderate cognitive impairment Psych: non-anxious affect, A and O x 1-2 Hem/lymph/immuno: no widespread bruising   CURRENT PROBLEM LIST:  Patient Active Problem List  Diagnosis Date Noted  . Malnutrition of moderate degree 09/18/2020  . CAP (community acquired pneumonia) 09/16/2020  . Carotid stenosis 01/03/2017  . PAD (peripheral artery disease) ([facility]) 01/03/2017  . Back pain, chronic 09/13/2015  . Degenerative arthritis of lumbar spine 06/13/2015  . Low serum cobalamin 03/21/2015  . Anxiety 09/25/2014  . Chronic obstructive pulmonary disease ([facility]) 11/13/2013  . BP (high blood pressure) 11/13/2013  . HLD (hyperlipidemia) 11/13/2013   PAST MEDICAL HISTORY:  Active Ambulatory Problems    Diagnosis Date Noted  . Anxiety 09/25/2014  . Back pain, chronic 09/13/2015  . Chronic obstructive pulmonary disease ([facility]) 11/13/2013  . Low serum cobalamin 03/21/2015  . Degenerative arthritis of lumbar spine 06/13/2015  . BP (high blood pressure) 11/13/2013  . HLD (hyperlipidemia) 11/13/2013  . Carotid stenosis 01/03/2017  . PAD (peripheral  artery disease) ([facility]) 01/03/2017  . CAP (community acquired pneumonia) 09/16/2020  . Malnutrition of moderate degree 09/18/2020   Resolved Ambulatory Problems    Diagnosis Date Noted  . No Resolved Ambulatory Problems   Past Medical History:  Diagnosis Date  . Allergy   . Arthritis   . COPD (chronic obstructive pulmonary disease) ([facility])   . Hyperlipidemia   . Hypertension   . Oxygen deficiency    SOCIAL HX:  Social History   Tobacco Use  . Smoking status: Former Smoker    Packs/day: 1.50    Years: 30.00    Pack years: 45.00    Types: Cigarettes    Quit date: 10/06/2014    Years since quitting: 5.9  . Smokeless tobacco: Never Used  Substance Use Topics  . Alcohol use: No   FAMILY HX:  Family History  Problem Relation Age of Onset  . Breast cancer Maternal Aunt   . Heart disease Mother   . ALS Father   . Heart disease Father   . Lymphoma Brother       ALLERGIES:  Allergies  Allergen Reactions  . Codeine Itching  . Penicillins Other (See Comments)    Patient unsure  . Sulfa Antibiotics Itching     PERTINENT MEDICATIONS:  Outpatient Encounter Medications as of 10/01/2020  Medication Sig  . albuterol (PROVENTIL HFA;VENTOLIN HFA) 108 (90 Base) MCG/ACT inhaler Inhale 2 puffs into the lungs every 6 (six) hours as needed for wheezing or shortness of breath.  Marland Kitchen aspirin (ASPIRIN EC) 81 MG EC tablet Take 81 mg by mouth daily. Swallow whole.  . baclofen (LIORESAL) 10 MG tablet Take 10 mg by mouth 3 (three) times daily.  Marland Kitchen CALCIUM CARBONATE PO Take 1 tablet by mouth daily.  . cetirizine (ZYRTEC) 10 MG tablet Take 10 mg by mouth daily.  . Cholecalciferol (VITAMIN D3) 1000 units CAPS Take 1 capsule by mouth daily.  . diazepam (VALIUM) 5 MG tablet Take 5 mg by mouth at bedtime. Patient takes once at night Reported on 10/16/2015  . docusate sodium (COLACE) 100 MG capsule Take 100 mg by mouth 2 (two) times daily as needed for mild constipation.  Marland Kitchen escitalopram (LEXAPRO) 5 MG  tablet Take 10 mg by mouth daily.   . Fluticasone-Salmeterol (ADVAIR) 250-50 MCG/DOSE AEPB Inhale 1 puff into the lungs 2 (two) times daily.  Marland Kitchen gabapentin (NEURONTIN) 300 MG capsule Take 1 capsule (300 mg total) by mouth 2 (two) times daily.  Marland Kitchen HYDROcodone-acetaminophen (NORCO/VICODIN) 5-325 MG tablet Take 1 tablet by mouth 5 (five) times daily as needed for moderate pain. Do not exceed 5 times daily, patient at least 3 day  .  losartan (COZAAR) 50 MG tablet Take 50 mg by mouth daily.  Marland Kitchen lovastatin (MEVACOR) 20 MG tablet Take 20 mg by mouth at bedtime. Reported on 12/12/2015  . metoprolol succinate (TOPROL-XL) 25 MG 24 hr tablet Take 25 mg by mouth daily.  . montelukast (SINGULAIR) 10 MG tablet Take 10 mg by mouth at bedtime. Reported on 12/12/2015  . tamsulosin (FLOMAX) 0.4 MG CAPS capsule Take 0.4 mg by mouth daily after supper.  . vitamin B-12 (CYANOCOBALAMIN) 1000 MCG tablet Take 1,000 mcg by mouth daily.   No facility-administered encounter medications on file as of 10/01/2020.   Thank you for the opportunity to participate in the care of [name] [name].  The palliative care team will continue to follow. Please call our office at [phone] if we can be of additional assistance.   Jason Coop, NP , DNP, MPH, AGPCNP-BC, ACHPN  COVID-19 PATIENT SCREENING TOOL Asked and negative response unless otherwise noted:   Have you had symptoms of covid, tested positive or been in contact with someone with symptoms/positive test in the past 5-10 days?  acc

## 2020-10-14 DIAGNOSIS — J449 Chronic obstructive pulmonary disease, unspecified: Secondary | ICD-10-CM | POA: Diagnosis not present

## 2020-10-14 DIAGNOSIS — I739 Peripheral vascular disease, unspecified: Secondary | ICD-10-CM | POA: Diagnosis not present

## 2020-10-14 DIAGNOSIS — M47816 Spondylosis without myelopathy or radiculopathy, lumbar region: Secondary | ICD-10-CM | POA: Diagnosis not present

## 2020-10-14 DIAGNOSIS — E785 Hyperlipidemia, unspecified: Secondary | ICD-10-CM | POA: Diagnosis not present

## 2020-10-14 DIAGNOSIS — F419 Anxiety disorder, unspecified: Secondary | ICD-10-CM | POA: Diagnosis not present

## 2020-10-14 DIAGNOSIS — I10 Essential (primary) hypertension: Secondary | ICD-10-CM | POA: Diagnosis not present

## 2020-10-14 DIAGNOSIS — I6529 Occlusion and stenosis of unspecified carotid artery: Secondary | ICD-10-CM | POA: Diagnosis not present

## 2020-10-14 DIAGNOSIS — E440 Moderate protein-calorie malnutrition: Secondary | ICD-10-CM | POA: Diagnosis not present

## 2020-10-14 DIAGNOSIS — G8929 Other chronic pain: Secondary | ICD-10-CM | POA: Diagnosis not present

## 2020-10-15 DIAGNOSIS — J449 Chronic obstructive pulmonary disease, unspecified: Secondary | ICD-10-CM | POA: Diagnosis not present

## 2020-10-15 DIAGNOSIS — E785 Hyperlipidemia, unspecified: Secondary | ICD-10-CM | POA: Diagnosis not present

## 2020-10-15 DIAGNOSIS — I6529 Occlusion and stenosis of unspecified carotid artery: Secondary | ICD-10-CM | POA: Diagnosis not present

## 2020-10-15 DIAGNOSIS — I10 Essential (primary) hypertension: Secondary | ICD-10-CM | POA: Diagnosis not present

## 2020-10-15 DIAGNOSIS — E440 Moderate protein-calorie malnutrition: Secondary | ICD-10-CM | POA: Diagnosis not present

## 2020-10-15 DIAGNOSIS — M47816 Spondylosis without myelopathy or radiculopathy, lumbar region: Secondary | ICD-10-CM | POA: Diagnosis not present

## 2020-10-15 DIAGNOSIS — G8929 Other chronic pain: Secondary | ICD-10-CM | POA: Diagnosis not present

## 2020-10-15 DIAGNOSIS — F419 Anxiety disorder, unspecified: Secondary | ICD-10-CM | POA: Diagnosis not present

## 2020-10-15 DIAGNOSIS — I739 Peripheral vascular disease, unspecified: Secondary | ICD-10-CM | POA: Diagnosis not present

## 2020-10-17 DIAGNOSIS — G8929 Other chronic pain: Secondary | ICD-10-CM | POA: Diagnosis not present

## 2020-10-17 DIAGNOSIS — J449 Chronic obstructive pulmonary disease, unspecified: Secondary | ICD-10-CM | POA: Diagnosis not present

## 2020-10-17 DIAGNOSIS — F419 Anxiety disorder, unspecified: Secondary | ICD-10-CM | POA: Diagnosis not present

## 2020-10-17 DIAGNOSIS — I739 Peripheral vascular disease, unspecified: Secondary | ICD-10-CM | POA: Diagnosis not present

## 2020-10-17 DIAGNOSIS — M47816 Spondylosis without myelopathy or radiculopathy, lumbar region: Secondary | ICD-10-CM | POA: Diagnosis not present

## 2020-10-17 DIAGNOSIS — I10 Essential (primary) hypertension: Secondary | ICD-10-CM | POA: Diagnosis not present

## 2020-10-17 DIAGNOSIS — E785 Hyperlipidemia, unspecified: Secondary | ICD-10-CM | POA: Diagnosis not present

## 2020-10-17 DIAGNOSIS — E440 Moderate protein-calorie malnutrition: Secondary | ICD-10-CM | POA: Diagnosis not present

## 2020-10-17 DIAGNOSIS — I6529 Occlusion and stenosis of unspecified carotid artery: Secondary | ICD-10-CM | POA: Diagnosis not present

## 2020-10-18 DIAGNOSIS — I739 Peripheral vascular disease, unspecified: Secondary | ICD-10-CM | POA: Diagnosis not present

## 2020-10-18 DIAGNOSIS — E440 Moderate protein-calorie malnutrition: Secondary | ICD-10-CM | POA: Diagnosis not present

## 2020-10-18 DIAGNOSIS — F419 Anxiety disorder, unspecified: Secondary | ICD-10-CM | POA: Diagnosis not present

## 2020-10-18 DIAGNOSIS — G8929 Other chronic pain: Secondary | ICD-10-CM | POA: Diagnosis not present

## 2020-10-18 DIAGNOSIS — E785 Hyperlipidemia, unspecified: Secondary | ICD-10-CM | POA: Diagnosis not present

## 2020-10-18 DIAGNOSIS — I10 Essential (primary) hypertension: Secondary | ICD-10-CM | POA: Diagnosis not present

## 2020-10-18 DIAGNOSIS — I6529 Occlusion and stenosis of unspecified carotid artery: Secondary | ICD-10-CM | POA: Diagnosis not present

## 2020-10-18 DIAGNOSIS — J449 Chronic obstructive pulmonary disease, unspecified: Secondary | ICD-10-CM | POA: Diagnosis not present

## 2020-10-18 DIAGNOSIS — M47816 Spondylosis without myelopathy or radiculopathy, lumbar region: Secondary | ICD-10-CM | POA: Diagnosis not present

## 2020-10-21 DIAGNOSIS — I6529 Occlusion and stenosis of unspecified carotid artery: Secondary | ICD-10-CM | POA: Diagnosis not present

## 2020-10-21 DIAGNOSIS — M47816 Spondylosis without myelopathy or radiculopathy, lumbar region: Secondary | ICD-10-CM | POA: Diagnosis not present

## 2020-10-21 DIAGNOSIS — E785 Hyperlipidemia, unspecified: Secondary | ICD-10-CM | POA: Diagnosis not present

## 2020-10-21 DIAGNOSIS — E440 Moderate protein-calorie malnutrition: Secondary | ICD-10-CM | POA: Diagnosis not present

## 2020-10-21 DIAGNOSIS — I10 Essential (primary) hypertension: Secondary | ICD-10-CM | POA: Diagnosis not present

## 2020-10-21 DIAGNOSIS — I739 Peripheral vascular disease, unspecified: Secondary | ICD-10-CM | POA: Diagnosis not present

## 2020-10-21 DIAGNOSIS — G8929 Other chronic pain: Secondary | ICD-10-CM | POA: Diagnosis not present

## 2020-10-21 DIAGNOSIS — J449 Chronic obstructive pulmonary disease, unspecified: Secondary | ICD-10-CM | POA: Diagnosis not present

## 2020-10-21 DIAGNOSIS — F419 Anxiety disorder, unspecified: Secondary | ICD-10-CM | POA: Diagnosis not present

## 2020-10-24 DIAGNOSIS — I739 Peripheral vascular disease, unspecified: Secondary | ICD-10-CM | POA: Diagnosis not present

## 2020-10-24 DIAGNOSIS — E440 Moderate protein-calorie malnutrition: Secondary | ICD-10-CM | POA: Diagnosis not present

## 2020-10-24 DIAGNOSIS — I6529 Occlusion and stenosis of unspecified carotid artery: Secondary | ICD-10-CM | POA: Diagnosis not present

## 2020-10-24 DIAGNOSIS — F419 Anxiety disorder, unspecified: Secondary | ICD-10-CM | POA: Diagnosis not present

## 2020-10-24 DIAGNOSIS — J449 Chronic obstructive pulmonary disease, unspecified: Secondary | ICD-10-CM | POA: Diagnosis not present

## 2020-10-24 DIAGNOSIS — I10 Essential (primary) hypertension: Secondary | ICD-10-CM | POA: Diagnosis not present

## 2020-10-24 DIAGNOSIS — G8929 Other chronic pain: Secondary | ICD-10-CM | POA: Diagnosis not present

## 2020-10-24 DIAGNOSIS — M47816 Spondylosis without myelopathy or radiculopathy, lumbar region: Secondary | ICD-10-CM | POA: Diagnosis not present

## 2020-10-24 DIAGNOSIS — E785 Hyperlipidemia, unspecified: Secondary | ICD-10-CM | POA: Diagnosis not present

## 2020-10-25 DIAGNOSIS — I700 Atherosclerosis of aorta: Secondary | ICD-10-CM | POA: Diagnosis not present

## 2020-10-25 DIAGNOSIS — I2510 Atherosclerotic heart disease of native coronary artery without angina pectoris: Secondary | ICD-10-CM | POA: Diagnosis not present

## 2020-10-25 DIAGNOSIS — R531 Weakness: Secondary | ICD-10-CM | POA: Diagnosis not present

## 2020-10-25 DIAGNOSIS — J449 Chronic obstructive pulmonary disease, unspecified: Secondary | ICD-10-CM | POA: Diagnosis not present

## 2020-10-25 DIAGNOSIS — R55 Syncope and collapse: Secondary | ICD-10-CM | POA: Diagnosis not present

## 2020-10-25 DIAGNOSIS — M1990 Unspecified osteoarthritis, unspecified site: Secondary | ICD-10-CM | POA: Diagnosis not present

## 2020-10-25 DIAGNOSIS — R634 Abnormal weight loss: Secondary | ICD-10-CM | POA: Diagnosis not present

## 2020-10-25 DIAGNOSIS — R42 Dizziness and giddiness: Secondary | ICD-10-CM | POA: Diagnosis not present

## 2020-10-25 DIAGNOSIS — Z79899 Other long term (current) drug therapy: Secondary | ICD-10-CM | POA: Diagnosis not present

## 2020-10-29 DIAGNOSIS — G8929 Other chronic pain: Secondary | ICD-10-CM | POA: Diagnosis not present

## 2020-10-29 DIAGNOSIS — I10 Essential (primary) hypertension: Secondary | ICD-10-CM | POA: Diagnosis not present

## 2020-10-29 DIAGNOSIS — E785 Hyperlipidemia, unspecified: Secondary | ICD-10-CM | POA: Diagnosis not present

## 2020-10-29 DIAGNOSIS — I6529 Occlusion and stenosis of unspecified carotid artery: Secondary | ICD-10-CM | POA: Diagnosis not present

## 2020-10-29 DIAGNOSIS — F419 Anxiety disorder, unspecified: Secondary | ICD-10-CM | POA: Diagnosis not present

## 2020-10-29 DIAGNOSIS — E440 Moderate protein-calorie malnutrition: Secondary | ICD-10-CM | POA: Diagnosis not present

## 2020-10-29 DIAGNOSIS — M47816 Spondylosis without myelopathy or radiculopathy, lumbar region: Secondary | ICD-10-CM | POA: Diagnosis not present

## 2020-10-29 DIAGNOSIS — J449 Chronic obstructive pulmonary disease, unspecified: Secondary | ICD-10-CM | POA: Diagnosis not present

## 2020-10-29 DIAGNOSIS — I739 Peripheral vascular disease, unspecified: Secondary | ICD-10-CM | POA: Diagnosis not present

## 2020-10-30 DIAGNOSIS — I6529 Occlusion and stenosis of unspecified carotid artery: Secondary | ICD-10-CM | POA: Diagnosis not present

## 2020-10-30 DIAGNOSIS — I10 Essential (primary) hypertension: Secondary | ICD-10-CM | POA: Diagnosis not present

## 2020-10-30 DIAGNOSIS — G8929 Other chronic pain: Secondary | ICD-10-CM | POA: Diagnosis not present

## 2020-10-30 DIAGNOSIS — M47816 Spondylosis without myelopathy or radiculopathy, lumbar region: Secondary | ICD-10-CM | POA: Diagnosis not present

## 2020-10-30 DIAGNOSIS — I739 Peripheral vascular disease, unspecified: Secondary | ICD-10-CM | POA: Diagnosis not present

## 2020-10-30 DIAGNOSIS — F419 Anxiety disorder, unspecified: Secondary | ICD-10-CM | POA: Diagnosis not present

## 2020-10-30 DIAGNOSIS — J449 Chronic obstructive pulmonary disease, unspecified: Secondary | ICD-10-CM | POA: Diagnosis not present

## 2020-11-07 ENCOUNTER — Other Ambulatory Visit: Payer: Medicare HMO | Admitting: Primary Care

## 2020-11-07 ENCOUNTER — Other Ambulatory Visit: Payer: Self-pay

## 2020-11-07 DIAGNOSIS — Z515 Encounter for palliative care: Secondary | ICD-10-CM

## 2020-11-07 DIAGNOSIS — J449 Chronic obstructive pulmonary disease, unspecified: Secondary | ICD-10-CM | POA: Diagnosis not present

## 2020-11-07 DIAGNOSIS — E440 Moderate protein-calorie malnutrition: Secondary | ICD-10-CM | POA: Diagnosis not present

## 2020-11-07 DIAGNOSIS — R413 Other amnesia: Secondary | ICD-10-CM | POA: Insufficient documentation

## 2020-11-07 DIAGNOSIS — F419 Anxiety disorder, unspecified: Secondary | ICD-10-CM | POA: Diagnosis not present

## 2020-11-07 NOTE — Progress Notes (Signed)
[facility] Consult Note Telephone: [phone]  Fax: [phone]    Date of encounter: 11/07/20 PATIENT NAME: Bethany [name] [place] [address]   [phone] (home)  DOB: September 03, 1935 MRN: [id number] PRIMARY CARE PROVIDER:    Tracie Harrier, MD,  [address] [place] [address] [place] PROVIDER:   Tracie Harrier, [place],  [place] [address] [phone]  RESPONSIBLE PARTY:    Contact Information    Name Relation Home Work Mobile   Pinedo,[name] P Brother [phone]         I met face to face with patient and family in  home. Palliative Care was asked to follow this patient by consultation request of  Tracie Harrier, MD to address advance care planning and complex medical decision making. This is a follow up visit.                                   ASSESSMENT AND PLAN / RECOMMENDATIONS:   Advance Care Planning/Goals of Care: Goals include to maximize quality of life and symptom management. Our advance care planning conversation included a discussion about:     The value and importance of advance care planning   Experiences with loved ones who have been seriously ill or have died   Exploration of personal, cultural or spiritual beliefs that might influence medical decisions   Exploration of goals of care in the event of a sudden injury or illness   Identificationof a healthcare agent - Software engineer of an  advance directive document .  Decision not to resuscitate  due to poor prognosis.  CODE STATUS: DNR, MOST prepared and uploaded to [facility].  Symptom Management/Plan:  Compliance with meds an issue with memory. I recommend combining all meds to am and d/c baclofen, keep as prn. She has not taken her meds for 2 days due to memory loss. Her POA has gotten them pre packaged so this can be tracked  better. I also recommend pre packaging her narcotics for better measurement.  Reports Taking vicodan 5/325 5 x daily. She has done this for decades due to an injury as a young woman. Safety is an issue  and was discussed with POA.   ADLS: Brother says she's eating poorly, was in bed when he arrived with temp on 84 degrees. She has need for caregivers daily. Her memory loss is such that she must be cued and monitored, and esp. For safety.  Safety: denies falls and states she ambulates to a store behind her home for cigarettes. She is on oxygen. We discussed the safety issues and possibility of serious injury or death. POA is aware of this behavior and the dangers. Again advised POA that she will need more caregiving to supervise for safety. He voices understanding. We discussed facility placement and upcoming knee  surgery he is having. I have asked SW to reach out to discuss possible community services. I have also today given him the number for [facility].  Follow up Palliative Care Visit: Palliative care will continue to follow for complex medical decision making, advance care planning, and clarification of goals. Return 6 weeks or prn.  I spent 60 minutes providing this consultation. More than 50% of the time in this consultation was spent in counseling and care coordination.  PPS:  40%  HOSPICE ELIGIBILITY/DIAGNOSIS: TBD  Chief Complaint: memory loss  HISTORY OF PRESENT ILLNESS:  Bethany [name] is a 85 y.o. year old female  with COPD, oxygen dependence, memory loss .   History obtained from review of EMR, discussion with primary team, and interview with family, facility staff/caregiver and/or Bethany [name].  I reviewed available labs, medications, imaging, studies and related documents from the EMR.  Records reviewed and summarized above.   ROS  General: NAD ENMT: denies dysphagia Cardiovascular: denies chest pain, denies DOE Pulmonary: denies cough, denies increased SOB Abdomen:  endorses fair  appetite, denies constipation, endorses continence of bowel GU: denies dysuria, endorses continence of urine MSK:  Endorses  weakness,  no falls reported Skin: denies rashes or wounds Neurological: denies pain, denies insomnia Psych: Endorses positive mood Heme/lymph/immuno: denies bruises, abnormal bleeding  Physical Exam: Current and past weights: unavailable, need to obtain Constitutional: NAD General: frail appearing, thin EYES: anicteric sclera, lids intact, no discharge  ENMT: intact hearing, oral mucous membranes moist, dentition intact CV: S1S2, RRR, no LE edema Pulmonary: +increased work of breathing talking, no cough,oxygen continuous Abdomen: intake 25-50%, no ascites GU: deferred MSK: severe  sarcopenia, moves all extremities, ambulatory Skin: warm and dry, no rashes or wounds on visible skin Neuro:  + generalized weakness,  significant cognitive impairment Psych: non-anxious affect, A and O x 2 Hem/lymph/immuno: no widespread bruising   Thank you for the opportunity to participate in the care of Bethany [name].  The palliative care team will continue to follow. Please call our office at [phone] if we can be of additional assistance.   Jason Coop, NP , DNP, MPH, AGPCNP-BC, ACHPN  COVID-19 PATIENT SCREENING TOOL Asked and negative response unless otherwise noted:   Have you had symptoms of covid, tested positive or been in contact with someone with symptoms/positive test in the past 5-10 days?

## 2020-11-08 ENCOUNTER — Telehealth: Payer: Self-pay | Admitting: Primary Care

## 2020-11-08 NOTE — Telephone Encounter (Signed)
Discussion with brother Shellsea Borunda  after visit yesterday. Outlined patient's memory loss and risks for not eating, not taking meds properly and possible elopement. She also has large volumes of narcotics for her chronic back pain. She had a very serious illness a few months ago and was able to call him then but frequently does not answer the phone when he calls He voices extreme caregiver exhaustion and we discussed her advancing cognitive impairment. We discussed her being more supported in a facility where her meds would be overseen and she would get nutrition and socialization.    She would qualify for assisted living, likely memory care as well, to supplement her ability for adls, med management, nutrition, safety. Brother states that she is going to PCP in Monday, and will discuss with him as well. He's using a broker to find an assisted living option for her as well. I will also have our SW department call and discuss any remaining questions. I will ask PCP to administer a cognitive assessment screen, and I am happy to assist in any way I can to expedite her placement.

## 2020-11-11 ENCOUNTER — Telehealth: Payer: Self-pay

## 2020-11-11 DIAGNOSIS — I6523 Occlusion and stenosis of bilateral carotid arteries: Secondary | ICD-10-CM | POA: Diagnosis not present

## 2020-11-11 DIAGNOSIS — J9611 Chronic respiratory failure with hypoxia: Secondary | ICD-10-CM | POA: Diagnosis not present

## 2020-11-11 DIAGNOSIS — R8290 Unspecified abnormal findings in urine: Secondary | ICD-10-CM | POA: Diagnosis not present

## 2020-11-11 DIAGNOSIS — I2510 Atherosclerotic heart disease of native coronary artery without angina pectoris: Secondary | ICD-10-CM | POA: Diagnosis not present

## 2020-11-11 DIAGNOSIS — I10 Essential (primary) hypertension: Secondary | ICD-10-CM | POA: Diagnosis not present

## 2020-11-11 DIAGNOSIS — J432 Centrilobular emphysema: Secondary | ICD-10-CM | POA: Diagnosis not present

## 2020-11-11 DIAGNOSIS — I739 Peripheral vascular disease, unspecified: Secondary | ICD-10-CM | POA: Diagnosis not present

## 2020-11-11 NOTE — Telephone Encounter (Signed)
(  Late Entry) Per request of NP-K. Smith, Palliative care SW completed a follow-up call to patient's brother/PCG to provide support and resources to him. Mr. Moxon explained that he is patient's only relative ana he has been looking after her for the past 5/6 years. He feels that patient needs more oversight as she is now smoking on 02, not eating unless someone is there although she gets mobile meals. Patient has also had a period of unresponsiveness where she had to be taken to the ED. Mr. Stannard  Is scheduled to have a knee replacement on two weeks and will not be able to care for patient. SW obtained financial information for patient which is $2000/mth.  SW explained to him that a respite may need to be explored since this is an immediate need, while working on a permanent placement. Mr. Rayburn was fine with this and was grateful for the help. SW advised him that she will make some inquiries and follow-up with him next week.

## 2020-11-13 ENCOUNTER — Other Ambulatory Visit: Payer: Self-pay | Admitting: Primary Care

## 2020-11-13 DIAGNOSIS — R634 Abnormal weight loss: Secondary | ICD-10-CM | POA: Insufficient documentation

## 2020-11-14 DIAGNOSIS — J449 Chronic obstructive pulmonary disease, unspecified: Secondary | ICD-10-CM | POA: Diagnosis not present

## 2020-11-14 DIAGNOSIS — I739 Peripheral vascular disease, unspecified: Secondary | ICD-10-CM | POA: Diagnosis not present

## 2020-11-14 DIAGNOSIS — G8929 Other chronic pain: Secondary | ICD-10-CM | POA: Diagnosis not present

## 2020-11-14 DIAGNOSIS — F419 Anxiety disorder, unspecified: Secondary | ICD-10-CM | POA: Diagnosis not present

## 2020-11-14 DIAGNOSIS — M47816 Spondylosis without myelopathy or radiculopathy, lumbar region: Secondary | ICD-10-CM | POA: Diagnosis not present

## 2020-11-14 DIAGNOSIS — I6529 Occlusion and stenosis of unspecified carotid artery: Secondary | ICD-10-CM | POA: Diagnosis not present

## 2020-11-14 DIAGNOSIS — E440 Moderate protein-calorie malnutrition: Secondary | ICD-10-CM | POA: Diagnosis not present

## 2020-11-14 DIAGNOSIS — E785 Hyperlipidemia, unspecified: Secondary | ICD-10-CM | POA: Diagnosis not present

## 2020-11-14 DIAGNOSIS — I10 Essential (primary) hypertension: Secondary | ICD-10-CM | POA: Diagnosis not present

## 2020-11-15 ENCOUNTER — Telehealth: Payer: Self-pay

## 2020-11-15 NOTE — Telephone Encounter (Signed)
11/15/20 @ 12:45PM: Palliative care SW outreached patients brother, Joni Fears, to discuss respite stay options.   Brother shared that he has spoke with [facility] but no others. SW advised that [facility] offers respite and able to offer a bed as well. SW provided brother with contact info for [facility].

## 2020-11-21 ENCOUNTER — Telehealth: Payer: Self-pay

## 2020-11-21 DIAGNOSIS — G8929 Other chronic pain: Secondary | ICD-10-CM | POA: Diagnosis not present

## 2020-11-21 DIAGNOSIS — E785 Hyperlipidemia, unspecified: Secondary | ICD-10-CM | POA: Diagnosis not present

## 2020-11-21 DIAGNOSIS — F419 Anxiety disorder, unspecified: Secondary | ICD-10-CM | POA: Diagnosis not present

## 2020-11-21 DIAGNOSIS — J449 Chronic obstructive pulmonary disease, unspecified: Secondary | ICD-10-CM | POA: Diagnosis not present

## 2020-11-21 DIAGNOSIS — E440 Moderate protein-calorie malnutrition: Secondary | ICD-10-CM | POA: Diagnosis not present

## 2020-11-21 DIAGNOSIS — I739 Peripheral vascular disease, unspecified: Secondary | ICD-10-CM | POA: Diagnosis not present

## 2020-11-21 DIAGNOSIS — I10 Essential (primary) hypertension: Secondary | ICD-10-CM | POA: Diagnosis not present

## 2020-11-21 DIAGNOSIS — I6529 Occlusion and stenosis of unspecified carotid artery: Secondary | ICD-10-CM | POA: Diagnosis not present

## 2020-11-21 DIAGNOSIS — M47816 Spondylosis without myelopathy or radiculopathy, lumbar region: Secondary | ICD-10-CM | POA: Diagnosis not present

## 2020-11-21 NOTE — Telephone Encounter (Signed)
11/21/20 @ 11 AM: Palliative care SW outreached patients brother, Bethany [name], via email [email or web address], to inquire on follow up with ALF's that were discussed.    "Good morning Mr. Bethany [name] - I wanted to follow up with you and see if you have been able to speak with Judeth Cornfield with [facility] and Tammy with [facility] about their short term respite stay offers nd possible permanent placement?  Also, [facility] recently shared that they offer respite as well. $100/day for a semi private room, short stay.  Please feel free to outreach them at [phone] and ask to speak with their admissions coordinator about this, if you are still interested.   Please let me know if you have any questions."   Awaiting response from brother at this time.

## 2020-12-05 DIAGNOSIS — M5416 Radiculopathy, lumbar region: Secondary | ICD-10-CM | POA: Diagnosis not present

## 2020-12-05 DIAGNOSIS — M48062 Spinal stenosis, lumbar region with neurogenic claudication: Secondary | ICD-10-CM | POA: Diagnosis not present

## 2020-12-05 DIAGNOSIS — M5136 Other intervertebral disc degeneration, lumbar region: Secondary | ICD-10-CM | POA: Diagnosis not present

## 2020-12-18 ENCOUNTER — Telehealth: Payer: Self-pay | Admitting: Primary Care

## 2020-12-18 NOTE — Telephone Encounter (Signed)
T/c for reminder call for home visit tomorrow. No answer, message left.

## 2020-12-19 ENCOUNTER — Other Ambulatory Visit: Payer: Medicare HMO | Admitting: Primary Care

## 2020-12-19 ENCOUNTER — Other Ambulatory Visit: Payer: Self-pay

## 2020-12-19 DIAGNOSIS — J449 Chronic obstructive pulmonary disease, unspecified: Secondary | ICD-10-CM

## 2020-12-19 DIAGNOSIS — Z515 Encounter for palliative care: Secondary | ICD-10-CM | POA: Diagnosis not present

## 2020-12-19 DIAGNOSIS — F419 Anxiety disorder, unspecified: Secondary | ICD-10-CM

## 2020-12-19 DIAGNOSIS — R413 Other amnesia: Secondary | ICD-10-CM | POA: Diagnosis not present

## 2020-12-19 NOTE — Progress Notes (Signed)
[facility] Consult Note Telephone: [phone]  Fax: [phone]    Date of encounter: 12/19/20 PATIENT NAME: Bethany [name] [id number]   [phone] (home)  DOB: 10-27-1935 MRN: [id number] PRIMARY CARE PROVIDER:    Tracie Harrier, MD,  [address] [place] [address] [place] PROVIDER:   Tracie Harrier, [place],  [place] [address] [phone]  RESPONSIBLE PARTY:    Contact Information     Name Relation Home Work Mobile   Bethany [name] P Brother [phone]         I met face to face with patient alone in her home. Palliative Care was asked to follow this patient by consultation request of  Tracie Harrier, MD to address advance care planning and complex medical decision making. This is a follow up visit.                                   ASSESSMENT AND PLAN / RECOMMENDATIONS:   Advance Care Planning/Goals of Care: Goals include to maximize quality of life and symptom management. Our advance care planning conversation included a discussion about:     Exploration of personal, cultural or spiritual beliefs that might influence medical decisions  Exploration of goals of care in the event of a sudden injury or illness  CODE STATUS: DNR, limited scope  Symptom Management/Plan:  I met with patient in her home. Her main caregiver is her brother who had knee replacement surgery two weeks ago. He has not been able to provide care since that time which he knew ahead of time. We had discussed him planning for extra help during his convalescent time. Today I was met in the drive by a neighbor and a friend who do have concerns about patient's well-being.   I interviewed patient alone. She was able to accurately tell me her brother's surgery was two weeks ago unprompted. She was able to tell me that she was taking  her medicines and they were all dispensed  via pill pack and were up-to-date. This is likely being cued by a neighbor. She could not tell me when she had been hospitalized for serious illness in broad terms.   She was not sure about her oxygen level;  it was on 4 L and I decreased it to two.  She was not sure which she should be on. She did state she went outside to smoke her cigarettes but the neighbor states she visited her and the patient was sitting inside with oxygen on, smoking cigarettes.   We discussed smoking cessation today. While patient states that she does take her oxygen off and go outside, later in the conversation she stated that she took her mobile oxygen outside and smoked with her cigarettes. We discussed that she is putting herself and her neighbors at  risk by smoking with the oxygen.  It may be negotiable to decrease or oxygen use or maybe eliminate, depending on when this oxygen was initiated.  I can do a more formal assessment of oxygenation while on oxygen and off oxygen in the home, if needed.   She does have a COPD history. There is no doubt she has significant loss of short-term memory.  However she may be able to be maintained  in place safely with a caregiver 4 hrs/ daily to  cure her on ADLs, eating and medications. Safety is an issue with her smoking and if that cannot be ameliorated there may have to be a more official intervention. I have been in touch with [facility] and made my report.   Follow up Palliative Care Visit: Palliative care will continue to follow for complex medical decision making, advance care planning, and clarification of goals. Return 4 weeks or prn.  I spent 60 minutes providing this consultation. More than 50% of the time in this consultation was spent in counseling and care coordination.  PPS: 50%  HOSPICE ELIGIBILITY/DIAGNOSIS: TBD  Chief Complaint: memory loss, safety issues  HISTORY OF PRESENT ILLNESS:  Bethany [name] is a  85 y.o. year old female  with memory loss, COPD, recent CAP and sepsis (3 months). There is some concern she is  self neglecting and putting self in harm's way by smoking with oxygen on per reports of neighbor and family (unwitnessed by this Probation officer). She gets MOW and checks by friends and family. Community agencies are assessing her ability for self care.  History obtained from review of EMR, discussion with primary team, and interview with family, facility staff/caregiver and/or [name].  I reviewed available labs, medications, imaging, studies and related documents from the EMR.  Records reviewed and summarized above.   ROS  General: NAD EYES: denies vision changes ENMT: denies dysphagia Cardiovascular: denies chest pain, denies DOE Pulmonary: endorses dry  cough, denies increased SOB, endorses using Inogen when she goes out.  Abdomen: endorses good appetite, denies constipation, endorses continence of bowel GU: denies dysuria, endorses continence of urine MSK:  denies weakness,  no falls reported Skin: denies rashes or wounds Neurological: endorses some  pain, states she  takes pain meds 1 a day, maybe 2,  denies insomnia Psych: Endorses positive mood Heme/lymph/immuno: denies bruises, abnormal bleeding  Physical Exam: Current and past weights: 173/93 HR 76, Resp 24 Constitutional: NAD General: frail appearing, thin EYES: anicteric sclera, lids intact, no discharge  ENMT: intact hearing, oral mucous membranes moist, dentition intact CV: no LE edema Pulmonary: mild increased work of breathing at rest, no cough, pulse oxygen at  98% , oxygen at 2 l/ [place] Abdomen: intake 50%, no ascites GU: deferred MSK: severe  sarcopenia, moves all extremities, ambulatory Skin: warm and dry, no rashes or wounds on visible skin Neuro:  ++ generalized weakness,  moderate cognitive impairment Psych: non-anxious affect, A and O x 2 Hem/lymph/immuno: no widespread bruising  Thank you for the  opportunity to participate in the care of [name].  The palliative care team will continue to follow. Please call our office at [phone] if we can be of additional assistance.   Jason Coop, NP AGPCNP-BC CHPN  COVID-19 PATIENT SCREENING TOOL Asked and negative response unless otherwise noted:   Have you had symptoms of covid, tested positive or been in contact with someone with symptoms/positive test in the past 5-10 days?

## 2021-01-03 DIAGNOSIS — M48062 Spinal stenosis, lumbar region with neurogenic claudication: Secondary | ICD-10-CM | POA: Diagnosis not present

## 2021-01-03 DIAGNOSIS — M5416 Radiculopathy, lumbar region: Secondary | ICD-10-CM | POA: Diagnosis not present

## 2021-01-09 DIAGNOSIS — I10 Essential (primary) hypertension: Secondary | ICD-10-CM | POA: Diagnosis not present

## 2021-01-09 DIAGNOSIS — I6523 Occlusion and stenosis of bilateral carotid arteries: Secondary | ICD-10-CM | POA: Diagnosis not present

## 2021-01-09 DIAGNOSIS — I2510 Atherosclerotic heart disease of native coronary artery without angina pectoris: Secondary | ICD-10-CM | POA: Diagnosis not present

## 2021-01-09 DIAGNOSIS — I700 Atherosclerosis of aorta: Secondary | ICD-10-CM | POA: Diagnosis not present

## 2021-01-09 DIAGNOSIS — E7800 Pure hypercholesterolemia, unspecified: Secondary | ICD-10-CM | POA: Diagnosis not present

## 2021-01-09 DIAGNOSIS — J9611 Chronic respiratory failure with hypoxia: Secondary | ICD-10-CM | POA: Diagnosis not present

## 2021-01-09 DIAGNOSIS — J432 Centrilobular emphysema: Secondary | ICD-10-CM | POA: Diagnosis not present

## 2021-01-09 DIAGNOSIS — I739 Peripheral vascular disease, unspecified: Secondary | ICD-10-CM | POA: Diagnosis not present

## 2021-01-09 DIAGNOSIS — I712 Thoracic aortic aneurysm, without rupture: Secondary | ICD-10-CM | POA: Diagnosis not present

## 2021-02-06 ENCOUNTER — Other Ambulatory Visit: Payer: Medicare HMO | Admitting: Primary Care

## 2021-02-18 ENCOUNTER — Other Ambulatory Visit: Payer: Medicare HMO | Admitting: Primary Care

## 2021-02-18 ENCOUNTER — Other Ambulatory Visit: Payer: Self-pay

## 2021-02-18 DIAGNOSIS — J449 Chronic obstructive pulmonary disease, unspecified: Secondary | ICD-10-CM

## 2021-02-18 DIAGNOSIS — R413 Other amnesia: Secondary | ICD-10-CM | POA: Diagnosis not present

## 2021-02-18 DIAGNOSIS — Z515 Encounter for palliative care: Secondary | ICD-10-CM

## 2021-02-18 DIAGNOSIS — R634 Abnormal weight loss: Secondary | ICD-10-CM

## 2021-02-18 NOTE — Progress Notes (Signed)
[facility] Consult Note Telephone: [phone]  Fax: [phone]    Date of encounter: 02/18/21 11:48 AM PATIENT NAME: Bethany [name] [id number]   [phone] (home)  DOB: May 13, 1936 MRN: [id number] PRIMARY CARE PROVIDER:    Tracie Harrier, MD,  [address] [place] [address] [phone]  REFERRING PROVIDER:   Tracie Harrier, [place],  [place] [address] [phone]  RESPONSIBLE PARTY:    Contact Information     Name Relation Home Work Mobile   [name],[name] P Brother [phone]          I met face to face with patient in  home. Palliative Care was asked to follow this patient by consultation request of  Tracie Harrier, MD to address advance care planning and complex medical decision making. This is a follow up visit.                                   ASSESSMENT AND PLAN / RECOMMENDATIONS:   Advance Care Planning/Goals of Care: Goals include to maximize quality of life and symptom management. Our advance care planning conversation included a discussion about:     Identification aof a healthcare agent - brother Bethany [name] Review of an  advance directive document - No changes, posted on refrigerator CODE STATUS: DNR  Symptom Management/Plan:  Nutrition: Endorses forgetting to eat, has lost 30 lbs. We discussed her needing to gain some weight. She gets MOW and drinks 2 supplements daily ( per report) but is forgetful.  Dyspnea: When I arrived, did not have on oxygen and PO2 = 90%. With oxygen at 2 L  98% and less dyspnea. Did have an extinguished  cigarette in ashtray in kitchen, but not with oxygen running on my arrival.   Medication management: has meds in pre filled . Endorses taking them but days are not concurrent for ones taken. I was unable to ascertain when they were filled.  Orientation: Forgetful about  short term things but can remember birthdays, brother's trip, etc. Endorses she is not sure when she's eaten. Has a calendar but may benefit from large clock and calendar posted on the wall.  Follow up Palliative Care Visit: Palliative care will continue to follow for complex medical decision making, advance care planning, and clarification of goals. Return 8 weeks or prn.  I spent 60 minutes providing this consultation. More than 50% of the time in this consultation was spent in counseling and care coordination.  PPS: 50%  HOSPICE ELIGIBILITY/DIAGNOSIS: TBD  Chief Complaint: weight loss   HISTORY OF PRESENT ILLNESS:  Bethany [name] is a 85 y.o. year old female  with COPD, tobacco dependence, weight loss .   History obtained from review of EMR, discussion with primary team, and interview with family, facility staff/caregiver and/or Bethany [name].  I reviewed available labs, medications, imaging, studies and related documents from the EMR.  Records reviewed and summarized above.   ROS   General: NAD ENMT: denies dysphagia Cardiovascular: denies chest pain, endorses DOE Pulmonary: denies cough,  endorses  increased SOB Abdomen: endorses good appetite, but has lost weight, denies constipation, endorses continence of bowel GU: denies dysuria, endorses continence of urine MSK:  denies weakness,  no falls reported Skin: denies rashes or wounds Neurological: denies pain, controlled with chronic opioid use, denies insomnia Psych: Endorses positive  mood Heme/lymph/immuno: denies bruises, abnormal bleeding  Physical Exam: Current and past weights: 4/22 = 135 lbs, now = 104.2 lbs 30 lbs loss if 135 was accurate.  Constitutional: NAD General: frail appearing, thin EYES: anicteric sclera, lids intact, no discharge  ENMT: intact hearing, oral mucous membranes moist, dentition intact CV: no LE edema Pulmonary: slight  increased work of breathing, no cough, room air= 90% oxygen, PO2 at 2 L =  98% Abdomen: intake 50-75%,  no ascites GU: deferred MSK: + sarcopenia, moves all extremities, ambulatory without device Skin: warm and dry, no rashes or wounds on visible skin Neuro:  + generalized weakness,  severe  cognitive impairment Psych: non-anxious affect, A and O x 2-3, able to joke Hem/lymph/immuno: no widespread bruising  Thank you for the opportunity to participate in the care of Bethany [name].  The palliative care team will continue to follow. Please call our office at [phone] if we can be of additional assistance.   Jason Coop, NP   COVID-19 PATIENT SCREENING TOOL Asked and negative response unless otherwise noted:   Have you had symptoms of covid, tested positive or been in contact with someone with symptoms/positive test in the past 5-10 days?

## 2021-03-10 DIAGNOSIS — M5136 Other intervertebral disc degeneration, lumbar region: Secondary | ICD-10-CM | POA: Diagnosis not present

## 2021-03-10 DIAGNOSIS — M48062 Spinal stenosis, lumbar region with neurogenic claudication: Secondary | ICD-10-CM | POA: Diagnosis not present

## 2021-03-10 DIAGNOSIS — M5416 Radiculopathy, lumbar region: Secondary | ICD-10-CM | POA: Diagnosis not present

## 2021-04-22 ENCOUNTER — Other Ambulatory Visit: Payer: Self-pay

## 2021-04-22 ENCOUNTER — Other Ambulatory Visit: Payer: Medicare HMO | Admitting: Primary Care

## 2021-04-24 ENCOUNTER — Other Ambulatory Visit: Payer: Medicare HMO | Admitting: Primary Care

## 2021-04-30 DIAGNOSIS — Z0000 Encounter for general adult medical examination without abnormal findings: Secondary | ICD-10-CM | POA: Diagnosis not present

## 2021-04-30 DIAGNOSIS — Z1389 Encounter for screening for other disorder: Secondary | ICD-10-CM | POA: Diagnosis not present

## 2021-04-30 DIAGNOSIS — J432 Centrilobular emphysema: Secondary | ICD-10-CM | POA: Diagnosis not present

## 2021-04-30 DIAGNOSIS — E7800 Pure hypercholesterolemia, unspecified: Secondary | ICD-10-CM | POA: Diagnosis not present

## 2021-04-30 DIAGNOSIS — J9611 Chronic respiratory failure with hypoxia: Secondary | ICD-10-CM | POA: Diagnosis not present

## 2021-04-30 DIAGNOSIS — J449 Chronic obstructive pulmonary disease, unspecified: Secondary | ICD-10-CM | POA: Diagnosis not present

## 2021-04-30 DIAGNOSIS — F17210 Nicotine dependence, cigarettes, uncomplicated: Secondary | ICD-10-CM | POA: Diagnosis not present

## 2021-04-30 DIAGNOSIS — R413 Other amnesia: Secondary | ICD-10-CM | POA: Diagnosis not present

## 2021-04-30 DIAGNOSIS — R634 Abnormal weight loss: Secondary | ICD-10-CM | POA: Diagnosis not present

## 2021-04-30 DIAGNOSIS — Z79899 Other long term (current) drug therapy: Secondary | ICD-10-CM | POA: Diagnosis not present

## 2021-04-30 DIAGNOSIS — I739 Peripheral vascular disease, unspecified: Secondary | ICD-10-CM | POA: Diagnosis not present

## 2021-04-30 DIAGNOSIS — Z23 Encounter for immunization: Secondary | ICD-10-CM | POA: Diagnosis not present

## 2021-04-30 DIAGNOSIS — I10 Essential (primary) hypertension: Secondary | ICD-10-CM | POA: Diagnosis not present

## 2021-04-30 DIAGNOSIS — D649 Anemia, unspecified: Secondary | ICD-10-CM | POA: Diagnosis not present

## 2021-06-16 DIAGNOSIS — M5416 Radiculopathy, lumbar region: Secondary | ICD-10-CM | POA: Diagnosis not present

## 2021-06-16 DIAGNOSIS — M5136 Other intervertebral disc degeneration, lumbar region: Secondary | ICD-10-CM | POA: Diagnosis not present

## 2021-06-16 DIAGNOSIS — M48062 Spinal stenosis, lumbar region with neurogenic claudication: Secondary | ICD-10-CM | POA: Diagnosis not present

## 2021-07-10 DIAGNOSIS — F17218 Nicotine dependence, cigarettes, with other nicotine-induced disorders: Secondary | ICD-10-CM | POA: Diagnosis not present

## 2021-07-10 DIAGNOSIS — J449 Chronic obstructive pulmonary disease, unspecified: Secondary | ICD-10-CM | POA: Diagnosis not present

## 2021-07-15 ENCOUNTER — Other Ambulatory Visit: Payer: Medicare HMO | Admitting: Primary Care

## 2021-07-16 ENCOUNTER — Telehealth: Payer: Self-pay

## 2021-07-16 DIAGNOSIS — I10 Essential (primary) hypertension: Secondary | ICD-10-CM | POA: Diagnosis not present

## 2021-07-16 DIAGNOSIS — J432 Centrilobular emphysema: Secondary | ICD-10-CM | POA: Diagnosis not present

## 2021-07-16 DIAGNOSIS — I2510 Atherosclerotic heart disease of native coronary artery without angina pectoris: Secondary | ICD-10-CM | POA: Diagnosis not present

## 2021-07-16 DIAGNOSIS — I739 Peripheral vascular disease, unspecified: Secondary | ICD-10-CM | POA: Diagnosis not present

## 2021-07-16 DIAGNOSIS — E7800 Pure hypercholesterolemia, unspecified: Secondary | ICD-10-CM | POA: Diagnosis not present

## 2021-07-16 DIAGNOSIS — I6523 Occlusion and stenosis of bilateral carotid arteries: Secondary | ICD-10-CM | POA: Diagnosis not present

## 2021-07-16 DIAGNOSIS — J9611 Chronic respiratory failure with hypoxia: Secondary | ICD-10-CM | POA: Diagnosis not present

## 2021-07-16 DIAGNOSIS — I7121 Aneurysm of the ascending aorta, without rupture: Secondary | ICD-10-CM | POA: Diagnosis not present

## 2021-07-16 NOTE — Telephone Encounter (Signed)
1135 am.  Phone call made to patient to confirm her appt with Clearnce Sorrel, NP tomorrow at 1 pm.  Patient confirms date and time.

## 2021-07-17 ENCOUNTER — Other Ambulatory Visit: Payer: Medicare HMO | Admitting: Primary Care

## 2021-07-17 ENCOUNTER — Other Ambulatory Visit: Payer: Self-pay

## 2021-07-17 VITALS — Ht 65.0 in | Wt 96.0 lb

## 2021-07-17 DIAGNOSIS — Z515 Encounter for palliative care: Secondary | ICD-10-CM

## 2021-07-17 DIAGNOSIS — F419 Anxiety disorder, unspecified: Secondary | ICD-10-CM

## 2021-07-17 DIAGNOSIS — R413 Other amnesia: Secondary | ICD-10-CM

## 2021-07-17 DIAGNOSIS — M545 Low back pain, unspecified: Secondary | ICD-10-CM

## 2021-07-17 DIAGNOSIS — J449 Chronic obstructive pulmonary disease, unspecified: Secondary | ICD-10-CM

## 2021-07-17 DIAGNOSIS — G8929 Other chronic pain: Secondary | ICD-10-CM
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
## 2021-09-04 DIAGNOSIS — J449 Chronic obstructive pulmonary disease, unspecified: Secondary | ICD-10-CM | POA: Diagnosis not present

## 2021-09-22 DIAGNOSIS — M48062 Spinal stenosis, lumbar region with neurogenic claudication: Secondary | ICD-10-CM | POA: Diagnosis not present

## 2021-09-22 DIAGNOSIS — M5416 Radiculopathy, lumbar region: Secondary | ICD-10-CM | POA: Diagnosis not present

## 2021-09-22 DIAGNOSIS — M5136 Other intervertebral disc degeneration, lumbar region: Secondary | ICD-10-CM | POA: Diagnosis not present

## 2021-09-23 ENCOUNTER — Telehealth: Payer: Self-pay

## 2021-09-23 NOTE — Telephone Encounter (Signed)
430 pm.  Phone call made to patient to schedule a follow up visit for next month.  No answer.  Message left on VM. Phone call made to brother [place]  Visit scheduled for May 18 @ 1 pm.  ?

## 2021-10-05 DIAGNOSIS — J449 Chronic obstructive pulmonary disease, unspecified: Secondary | ICD-10-CM | POA: Diagnosis not present

## 2021-10-20 DIAGNOSIS — J432 Centrilobular emphysema: Secondary | ICD-10-CM | POA: Diagnosis not present

## 2021-10-23 ENCOUNTER — Telehealth: Payer: Self-pay

## 2021-10-23 ENCOUNTER — Other Ambulatory Visit: Payer: Medicare HMO | Admitting: Primary Care

## 2021-10-23 DIAGNOSIS — Z515 Encounter for palliative care: Secondary | ICD-10-CM

## 2021-10-23 NOTE — Progress Notes (Signed)
Patient refused scheduled visit by RN at the door. RN was to have TM with me for assessment. RN contracted POA with refusal. Will try to reschedule.

## 2021-10-23 NOTE — Telephone Encounter (Signed)
100 pm.  Arrived at patient's home for scheduled visit.  Patient answered the door and I introduced myself and advised of scheduled visit.  Patient visibly short of breath with O2 in place.  Sat on chair beside front door.  Patient declined visit and stated she was going to lay down. I questioned if she was okay and she nodded her head yes.  Patient then closed the door.  Phone call to brother Sharan [place]ag[place] to advise patient declined today's visit and we would re-schedule at a later date.  Ralene Bathe, NP updated.

## 2021-11-04 DIAGNOSIS — J449 Chronic obstructive pulmonary disease, unspecified: Secondary | ICD-10-CM | POA: Diagnosis not present

## 2021-12-05 DIAGNOSIS — J449 Chronic obstructive pulmonary disease, unspecified: Secondary | ICD-10-CM | POA: Diagnosis not present

## 2021-12-10 ENCOUNTER — Other Ambulatory Visit: Payer: Medicare HMO | Admitting: Primary Care

## 2021-12-10 ENCOUNTER — Telehealth: Payer: Self-pay | Admitting: Primary Care

## 2021-12-10 NOTE — Telephone Encounter (Signed)
T/c tobrother to announce visit. Had tried patient but no answer. Brother and POA states she is at home and he will go by and let her know of my visit between 11 and 1. When I arrived, no answer to door after several attempts. Calling card left, patient may reschedule.

## 2022-02-24 ENCOUNTER — Other Ambulatory Visit: Payer: Self-pay | Admitting: Pulmonary Disease

## 2022-02-24 DIAGNOSIS — R918 Other nonspecific abnormal finding of lung field: Secondary | ICD-10-CM

## 2022-03-05 ENCOUNTER — Encounter (INDEPENDENT_AMBULATORY_CARE_PROVIDER_SITE_OTHER): Payer: Medicare HMO | Admitting: Vascular Surgery

## 2022-03-05 ENCOUNTER — Encounter (INDEPENDENT_AMBULATORY_CARE_PROVIDER_SITE_OTHER): Payer: Medicare HMO

## 2022-03-11 DIAGNOSIS — J449 Chronic obstructive pulmonary disease, unspecified: Secondary | ICD-10-CM | POA: Diagnosis not present

## 2022-03-24 ENCOUNTER — Encounter (INDEPENDENT_AMBULATORY_CARE_PROVIDER_SITE_OTHER): Payer: Medicare HMO | Admitting: Vascular Surgery

## 2022-03-24 ENCOUNTER — Encounter (INDEPENDENT_AMBULATORY_CARE_PROVIDER_SITE_OTHER): Payer: Medicare HMO

## 2022-03-26 ENCOUNTER — Ambulatory Visit: Payer: Medicare HMO

## 2022-04-20 DIAGNOSIS — M48062 Spinal stenosis, lumbar region with neurogenic claudication: Secondary | ICD-10-CM | POA: Diagnosis not present

## 2022-04-20 DIAGNOSIS — M5416 Radiculopathy, lumbar region: Secondary | ICD-10-CM | POA: Diagnosis not present

## 2022-05-14 DIAGNOSIS — J449 Chronic obstructive pulmonary disease, unspecified: Secondary | ICD-10-CM | POA: Diagnosis not present

## 2022-05-14 DIAGNOSIS — M5416 Radiculopathy, lumbar region: Secondary | ICD-10-CM | POA: Diagnosis not present

## 2022-05-14 DIAGNOSIS — M48062 Spinal stenosis, lumbar region with neurogenic claudication: Secondary | ICD-10-CM | POA: Diagnosis not present

## 2022-05-19 ENCOUNTER — Other Ambulatory Visit (INDEPENDENT_AMBULATORY_CARE_PROVIDER_SITE_OTHER): Payer: Self-pay | Admitting: Nurse Practitioner

## 2022-05-19 DIAGNOSIS — I6523 Occlusion and stenosis of bilateral carotid arteries: Secondary | ICD-10-CM

## 2022-05-20 ENCOUNTER — Encounter (INDEPENDENT_AMBULATORY_CARE_PROVIDER_SITE_OTHER): Payer: Medicare PPO | Admitting: Nurse Practitioner

## 2022-05-20 ENCOUNTER — Encounter (INDEPENDENT_AMBULATORY_CARE_PROVIDER_SITE_OTHER): Payer: Medicare PPO

## 2022-05-26 DIAGNOSIS — E7800 Pure hypercholesterolemia, unspecified: Secondary | ICD-10-CM | POA: Diagnosis not present

## 2022-07-06 ENCOUNTER — Other Ambulatory Visit: Payer: Self-pay | Admitting: Physician Assistant

## 2022-07-06 DIAGNOSIS — F02A0 Dementia in other diseases classified elsewhere, mild, without behavioral disturbance, psychotic disturbance, mood disturbance, and anxiety: Secondary | ICD-10-CM

## 2022-07-06 DIAGNOSIS — R2681 Unsteadiness on feet: Secondary | ICD-10-CM

## 2022-12-07 ENCOUNTER — Encounter: Payer: Self-pay | Admitting: Internal Medicine

## 2022-12-07 ENCOUNTER — Emergency Department: Payer: Medicare HMO

## 2022-12-07 ENCOUNTER — Other Ambulatory Visit: Payer: Self-pay

## 2022-12-07 ENCOUNTER — Inpatient Hospital Stay
Admission: EM | Admit: 2022-12-07 | Discharge: 2022-12-14 | DRG: 563 | Disposition: A | Discharge status: Skilled Nursing Facility | Payer: Medicare HMO | Attending: Student | Admitting: Student

## 2022-12-07 DIAGNOSIS — Y92010 Kitchen of single-family (private) house as the place of occurrence of the external cause: Secondary | ICD-10-CM

## 2022-12-07 DIAGNOSIS — Z7951 Long term (current) use of inhaled steroids: Secondary | ICD-10-CM

## 2022-12-07 DIAGNOSIS — E785 Hyperlipidemia, unspecified: Secondary | ICD-10-CM

## 2022-12-07 DIAGNOSIS — R531 Weakness: Secondary | ICD-10-CM | POA: Diagnosis not present

## 2022-12-07 DIAGNOSIS — E43 Unspecified severe protein-calorie malnutrition: Secondary | ICD-10-CM

## 2022-12-07 DIAGNOSIS — W19XXXA Unspecified fall, initial encounter: Secondary | ICD-10-CM | POA: Diagnosis not present

## 2022-12-07 DIAGNOSIS — Z8249 Family history of ischemic heart disease and other diseases of the circulatory system: Secondary | ICD-10-CM

## 2022-12-07 DIAGNOSIS — N3000 Acute cystitis without hematuria: Secondary | ICD-10-CM

## 2022-12-07 DIAGNOSIS — J9611 Chronic respiratory failure with hypoxia: Secondary | ICD-10-CM | POA: Diagnosis present

## 2022-12-07 DIAGNOSIS — N390 Urinary tract infection, site not specified: Secondary | ICD-10-CM | POA: Diagnosis present

## 2022-12-07 DIAGNOSIS — Z880 Allergy status to penicillin: Secondary | ICD-10-CM

## 2022-12-07 DIAGNOSIS — Z681 Body mass index (BMI) 19 or less, adult: Secondary | ICD-10-CM

## 2022-12-07 DIAGNOSIS — S42212A Unspecified displaced fracture of surgical neck of left humerus, initial encounter for closed fracture: Secondary | ICD-10-CM | POA: Diagnosis not present

## 2022-12-07 DIAGNOSIS — I10 Essential (primary) hypertension: Secondary | ICD-10-CM

## 2022-12-07 DIAGNOSIS — M1990 Unspecified osteoarthritis, unspecified site: Secondary | ICD-10-CM | POA: Diagnosis present

## 2022-12-07 DIAGNOSIS — Z807 Family history of other malignant neoplasms of lymphoid, hematopoietic and related tissues: Secondary | ICD-10-CM

## 2022-12-07 DIAGNOSIS — E559 Vitamin D deficiency, unspecified: Secondary | ICD-10-CM | POA: Diagnosis present

## 2022-12-07 DIAGNOSIS — J449 Chronic obstructive pulmonary disease, unspecified: Secondary | ICD-10-CM

## 2022-12-07 DIAGNOSIS — S42302A Unspecified fracture of shaft of humerus, left arm, initial encounter for closed fracture: Secondary | ICD-10-CM | POA: Diagnosis present

## 2022-12-07 DIAGNOSIS — Z9981 Dependence on supplemental oxygen: Secondary | ICD-10-CM

## 2022-12-07 DIAGNOSIS — F0394 Unspecified dementia, unspecified severity, with anxiety: Secondary | ICD-10-CM | POA: Diagnosis present

## 2022-12-07 DIAGNOSIS — Z882 Allergy status to sulfonamides status: Secondary | ICD-10-CM

## 2022-12-07 DIAGNOSIS — Z79899 Other long term (current) drug therapy: Secondary | ICD-10-CM

## 2022-12-07 DIAGNOSIS — F418 Other specified anxiety disorders: Secondary | ICD-10-CM | POA: Diagnosis present

## 2022-12-07 DIAGNOSIS — Z7982 Long term (current) use of aspirin: Secondary | ICD-10-CM

## 2022-12-07 DIAGNOSIS — Z23 Encounter for immunization: Secondary | ICD-10-CM

## 2022-12-07 DIAGNOSIS — I2510 Atherosclerotic heart disease of native coronary artery without angina pectoris: Secondary | ICD-10-CM | POA: Diagnosis present

## 2022-12-07 DIAGNOSIS — Z66 Do not resuscitate: Secondary | ICD-10-CM | POA: Diagnosis present

## 2022-12-07 DIAGNOSIS — Z885 Allergy status to narcotic agent status: Secondary | ICD-10-CM

## 2022-12-07 DIAGNOSIS — Z803 Family history of malignant neoplasm of breast: Secondary | ICD-10-CM

## 2022-12-07 DIAGNOSIS — W1830XA Fall on same level, unspecified, initial encounter: Secondary | ICD-10-CM | POA: Diagnosis present

## 2022-12-07 DIAGNOSIS — Y92009 Unspecified place in unspecified non-institutional (private) residence as the place of occurrence of the external cause: Secondary | ICD-10-CM

## 2022-12-07 DIAGNOSIS — F0393 Unspecified dementia, unspecified severity, with mood disturbance: Secondary | ICD-10-CM | POA: Diagnosis present

## 2022-12-07 DIAGNOSIS — R413 Other amnesia: Secondary | ICD-10-CM

## 2022-12-07 DIAGNOSIS — E440 Moderate protein-calorie malnutrition: Secondary | ICD-10-CM | POA: Diagnosis present

## 2022-12-07 DIAGNOSIS — B9620 Unspecified Escherichia coli [E. coli] as the cause of diseases classified elsewhere: Secondary | ICD-10-CM | POA: Diagnosis present

## 2022-12-07 DIAGNOSIS — Z602 Problems related to living alone: Secondary | ICD-10-CM | POA: Diagnosis present

## 2022-12-07 LAB — COMPREHENSIVE METABOLIC PANEL
ALT: 11 U/L (ref 0–44)
AST: 19 U/L (ref 15–41)
Albumin: 3.4 g/dL — ABNORMAL LOW (ref 3.5–5.0)
Alkaline Phosphatase: 49 U/L (ref 38–126)
Anion gap: 7 (ref 5–15)
BUN: 21 mg/dL (ref 8–23)
CO2: 26 mmol/L (ref 22–32)
Calcium: 8.9 mg/dL (ref 8.9–10.3)
Chloride: 104 mmol/L (ref 98–111)
Creatinine, Ser: 0.7 mg/dL (ref 0.44–1.00)
GFR, Estimated: >60 mL/min (ref >60)
Glucose, Bld: 112 mg/dL — ABNORMAL HIGH (ref 70–99)
Potassium: 4.1 mmol/L (ref 3.5–5.1)
Sodium: 137 mmol/L (ref 135–145)
Total Bilirubin: 1.2 mg/dL (ref 0.3–1.2)
Total Protein: 6.1 g/dL — ABNORMAL LOW (ref 6.5–8.1)

## 2022-12-07 LAB — URINALYSIS, ROUTINE W REFLEX MICROSCOPIC
Bilirubin Urine: NEGATIVE
Glucose, UA: NEGATIVE mg/dL
Hgb urine dipstick: NEGATIVE
Ketones, ur: NEGATIVE mg/dL
Nitrite: POSITIVE — AB
Protein, ur: NEGATIVE mg/dL
Specific Gravity, Urine: 1.005 (ref 1.005–1.030)
pH: 6 (ref 5.0–8.0)

## 2022-12-07 LAB — CBC WITH DIFFERENTIAL/PLATELET
Abs Immature Granulocytes: 0.06 10*3/uL (ref 0.00–0.07)
Basophils Absolute: 0.1 10*3/uL (ref 0.0–0.1)
Basophils Relative: 1 %
Eosinophils Absolute: 0.1 10*3/uL (ref 0.0–0.5)
Eosinophils Relative: 0 %
HCT: 37.6 % (ref 36.0–46.0)
Hemoglobin: 11.8 g/dL — ABNORMAL LOW (ref 12.0–15.0)
Immature Granulocytes: 1 %
Lymphocytes Relative: 10 %
Lymphs Abs: 1.2 10*3/uL (ref 0.7–4.0)
MCH: 32.2 pg (ref 26.0–34.0)
MCHC: 31.4 g/dL (ref 30.0–36.0)
MCV: 102.7 fL — ABNORMAL HIGH (ref 80.0–100.0)
Monocytes Absolute: 1.1 10*3/uL — ABNORMAL HIGH (ref 0.1–1.0)
Monocytes Relative: 9 %
Neutro Abs: 9.4 10*3/uL — ABNORMAL HIGH (ref 1.7–7.7)
Neutrophils Relative %: 79 %
Platelets: 189 10*3/uL (ref 150–400)
RBC: 3.66 MIL/uL — ABNORMAL LOW (ref 3.87–5.11)
RDW: 12.9 % (ref 11.5–15.5)
WBC: 11.9 10*3/uL — ABNORMAL HIGH (ref 4.0–10.5)
nRBC: 0 % (ref 0.0–0.2)

## 2022-12-07 LAB — TYPE AND SCREEN

## 2022-12-07 LAB — PROTIME-INR
INR: 1.1 (ref 0.8–1.2)
Prothrombin Time: 14.4 seconds (ref 11.4–15.2)

## 2022-12-07 LAB — BPAM RBC

## 2022-12-07 LAB — APTT: aPTT: 27 seconds (ref 24–36)

## 2022-12-07 LAB — ABO/RH: ABO/RH(D): A POS

## 2022-12-07 MED ORDER — HYDRALAZINE HCL 20 MG/ML IJ SOLN: 5.0000 mg | INTRAMUSCULAR | Status: DC | PRN

## 2022-12-07 MED ORDER — FLUTICASONE FUROATE-VILANTEROL 100-25 MCG/ACT IN AEPB
1.0000 | INHALATION_SPRAY | Freq: Every day | RESPIRATORY_TRACT | Status: DC
  Administered 2022-12-09 – 2022-12-14 (×6): 1 via RESPIRATORY_TRACT
  Filled 2022-12-07 (×2): qty 28

## 2022-12-07 MED ORDER — ACETAMINOPHEN 325 MG PO TABS
650.0000 mg | ORAL_TABLET | Freq: Four times a day (QID) | ORAL | Status: DC | PRN
  Administered 2022-12-08 – 2022-12-12 (×3): 650 mg via ORAL
  Filled 2022-12-07 (×3): qty 2

## 2022-12-07 MED ORDER — METHOCARBAMOL 500 MG PO TABS
500.0000 mg | ORAL_TABLET | Freq: Three times a day (TID) | ORAL | Status: DC | PRN
  Administered 2022-12-11 – 2022-12-14 (×7): 500 mg via ORAL
  Filled 2022-12-07 (×8): qty 1

## 2022-12-07 MED ORDER — OXYCODONE HCL 5 MG PO TABS
2.5000 mg | ORAL_TABLET | Freq: Once | ORAL | Status: AC
  Administered 2022-12-07: 2.5 mg via ORAL
  Filled 2022-12-07: qty 1

## 2022-12-07 MED ORDER — METOPROLOL SUCCINATE ER 25 MG PO TB24
25.0000 mg | ORAL_TABLET | Freq: Every day | ORAL | Status: DC
  Administered 2022-12-07 – 2022-12-14 (×8): 25 mg via ORAL
  Filled 2022-12-07 (×8): qty 1

## 2022-12-07 MED ORDER — SODIUM CHLORIDE 0.9 % IV SOLN
2.0000 g | Freq: Once | INTRAVENOUS | Status: AC
  Administered 2022-12-07: 2 g via INTRAVENOUS
  Filled 2022-12-07: qty 20

## 2022-12-07 MED ORDER — ROFLUMILAST 500 MCG PO TABS
250.0000 ug | ORAL_TABLET | Freq: Every day | ORAL | Status: DC
  Administered 2022-12-08 – 2022-12-14 (×7): 250 ug via ORAL
  Filled 2022-12-07 (×7): qty 1

## 2022-12-07 MED ORDER — SODIUM CHLORIDE 0.9 % IV SOLN
1.0000 g | INTRAVENOUS | Status: DC
  Administered 2022-12-08: 1 g via INTRAVENOUS
  Filled 2022-12-07 (×2): qty 10

## 2022-12-07 MED ORDER — OXYCODONE-ACETAMINOPHEN 5-325 MG PO TABS
1.0000 | ORAL_TABLET | ORAL | Status: DC | PRN
  Administered 2022-12-07 – 2022-12-14 (×21): 1 via ORAL
  Filled 2022-12-07 (×22): qty 1

## 2022-12-07 MED ORDER — ESCITALOPRAM OXALATE 10 MG PO TABS
5.0000 mg | ORAL_TABLET | Freq: Every day | ORAL | Status: DC
  Administered 2022-12-07 – 2022-12-14 (×8): 5 mg via ORAL
  Filled 2022-12-07 (×8): qty 1

## 2022-12-07 MED ORDER — LIDOCAINE 5 % EX PTCH
1.0000 | MEDICATED_PATCH | CUTANEOUS | Status: DC
  Administered 2022-12-07 – 2022-12-13 (×7): 1 via TRANSDERMAL
  Filled 2022-12-07 (×8): qty 1

## 2022-12-07 MED ORDER — DM-GUAIFENESIN ER 30-600 MG PO TB12: 1.0000 | ORAL_TABLET | Freq: Two times a day (BID) | ORAL | Status: DC | PRN

## 2022-12-07 MED ORDER — ENSURE ENLIVE PO LIQD
237.0000 mL | Freq: Two times a day (BID) | ORAL | Status: DC
  Administered 2022-12-07 – 2022-12-14 (×10): 237 mL via ORAL

## 2022-12-07 MED ORDER — TAMSULOSIN HCL 0.4 MG PO CAPS
0.4000 mg | ORAL_CAPSULE | Freq: Every day | ORAL | Status: DC
  Administered 2022-12-07 – 2022-12-12 (×6): 0.4 mg via ORAL
  Filled 2022-12-07 (×7): qty 1

## 2022-12-07 MED ORDER — FENTANYL CITRATE PF 50 MCG/ML IJ SOSY
12.5000 ug | PREFILLED_SYRINGE | INTRAMUSCULAR | Status: DC | PRN
  Administered 2022-12-07: 12.5 ug via INTRAVENOUS
  Filled 2022-12-07: qty 1

## 2022-12-07 MED ORDER — ACETAMINOPHEN 500 MG PO TABS
1000.0000 mg | ORAL_TABLET | Freq: Once | ORAL | Status: AC
  Administered 2022-12-07: 1000 mg via ORAL
  Filled 2022-12-07: qty 2

## 2022-12-07 MED ORDER — ATORVASTATIN CALCIUM 20 MG PO TABS
40.0000 mg | ORAL_TABLET | Freq: Every evening | ORAL | Status: DC
  Administered 2022-12-07 – 2022-12-13 (×7): 40 mg via ORAL
  Filled 2022-12-07 (×7): qty 2

## 2022-12-07 MED ORDER — VITAMIN D 25 MCG (1000 UNIT) PO TABS
1000.0000 [IU] | ORAL_TABLET | Freq: Every day | ORAL | Status: DC
  Administered 2022-12-07 – 2022-12-14 (×8): 1000 [IU] via ORAL
  Filled 2022-12-07 (×8): qty 1

## 2022-12-07 MED ORDER — MEMANTINE HCL 5 MG PO TABS
10.0000 mg | ORAL_TABLET | Freq: Two times a day (BID) | ORAL | Status: DC
  Administered 2022-12-07 – 2022-12-14 (×14): 10 mg via ORAL
  Filled 2022-12-07 (×14): qty 2

## 2022-12-07 MED ORDER — SENNOSIDES-DOCUSATE SODIUM 8.6-50 MG PO TABS: 1.0000 | ORAL_TABLET | Freq: Every evening | ORAL | Status: DC | PRN

## 2022-12-07 MED ORDER — UMECLIDINIUM BROMIDE 62.5 MCG/ACT IN AEPB
1.0000 | INHALATION_SPRAY | Freq: Every day | RESPIRATORY_TRACT | Status: DC
  Administered 2022-12-09 – 2022-12-14 (×6): 1 via RESPIRATORY_TRACT
  Filled 2022-12-07 (×2): qty 7

## 2022-12-07 MED ORDER — ENOXAPARIN SODIUM 40 MG/0.4ML IJ SOSY
40.0000 mg | PREFILLED_SYRINGE | INTRAMUSCULAR | Status: DC
  Administered 2022-12-07 – 2022-12-13 (×7): 40 mg via SUBCUTANEOUS
  Filled 2022-12-07 (×7): qty 0.4

## 2022-12-07 MED ORDER — ALBUTEROL SULFATE (2.5 MG/3ML) 0.083% IN NEBU: 3.0000 mL | INHALATION_SOLUTION | RESPIRATORY_TRACT | Status: DC | PRN

## 2022-12-07 MED ORDER — ONDANSETRON HCL 4 MG/2ML IJ SOLN: 4.0000 mg | Freq: Three times a day (TID) | INTRAMUSCULAR | Status: DC | PRN

## 2022-12-07 NOTE — ED Notes (Signed)
Pt ambulated with this RN to toilet. Tolerated well besides the shoulder pain.

## 2022-12-07 NOTE — H&P (Signed)
History and Physical    PAMA FARO ZOX:[id number] DOB: February 01, 1936 DOA: 12/07/2022  Referring MD/NP/PA:   PCP: Barbette Reichmann, MD   Patient coming from:  The patient is coming from home.     Chief Complaint: fall and left shoulder pain, increased urinary frequency.  HPI: JOEN HEVNER is a 87 y.o. female with medical history significant of Hypertension, hyperlipidemia, COPD, CAD, memory loss, depression with anxiety, carotid artery stenosis, former smoker, who presents with fall and left shoulder pain.  Patient states that she accidentally fell when she was walking in kitchen.  No loss of consciousness.  Denies head or neck injury.  She injured her left shoulder, causing pain in left shoulder, which is constant, severe, sharp, nonradiating.  It is aggravated by movement of left arm.  She has generalized weakness.  Denies nausea, vomiting, diarrhea or abdominal pain.  She has increased urinary frequency in the past several days.  Denies dysuria or burning with urination.  No hematuria.  Data reviewed independently and ED Course: pt was found to have WBC 11.9, positive urinalysis (clear appearance, moderate amount of leukocyte, positive nitrite, many bacteria, WBC 11-20), GFR> 60.  Temperature normal, blood pressure 150/64, heart rate 87, RR 16, oxygen saturation 100% on 3 L oxygen. Pt is placed on med-surg bed for obs. Dr. Joice Lofts of ortho is consulted.   EKG: I have personally reviewed.  Sinus rhythm, QTc 444, and stable baseline, low voltage.   Review of Systems:   General: no fevers, chills, no body weight gain, has fatigue HEENT: no blurry vision, hearing changes or sore throat Respiratory: no dyspnea, coughing, wheezing CV: no chest pain, no palpitations GI: no nausea, vomiting, abdominal pain, diarrhea, constipation GU: no dysuria, burning on urination, has increased urinary frequency, no hematuria  Ext: no leg edema Neuro: no unilateral weakness, numbness, or tingling, no  vision change or hearing loss.  Has full Skin: no rash, no skin tear. MSK: No muscle spasm, no deformity, no limitation of range of movement in spin.  Has left shoulder pain Heme: No easy bruising.  Travel history: No recent long distant travel.   Allergy:  Allergies  Allergen Reactions   Codeine Itching   Penicillins Other (See Comments)    Patient unsure   Sulfa Antibiotics Itching    Past Medical History:  Diagnosis Date   Allergy    Anxiety    Arthritis    COPD (chronic obstructive pulmonary disease) (HCC)    Hyperlipidemia    Hypertension    Oxygen deficiency     Past Surgical History:  Procedure Laterality Date   ABDOMINAL HYSTERECTOMY     SPINE SURGERY     Back surgery x8    Social History:  reports that she quit smoking about 8 years ago. Her smoking use included cigarettes. She has a 45.00 pack-year smoking history. She has never used smokeless tobacco. She reports that she does not drink alcohol and does not use drugs.  Family History:  Family History  Problem Relation Age of Onset   Breast cancer Maternal Aunt    Heart disease Mother    ALS Father    Heart disease Father    Lymphoma Brother      Prior to Admission medications   Medication Sig Start Date End Date Taking? Authorizing Provider  albuterol (PROVENTIL HFA;VENTOLIN HFA) 108 (90 Base) MCG/ACT inhaler Inhale 2 puffs into the lungs every 6 (six) hours as needed for wheezing or shortness of breath. 03/23/16  Emily Filbert, MD  amLODipine (NORVASC) 2.5 MG tablet Take 2.5 mg by mouth daily. 07/16/20   [provider]  aspirin 81 MG EC tablet Take 81 mg by mouth daily. Swallow whole.    [provider]  baclofen (LIORESAL) 10 MG tablet Take 10 mg by mouth 3 (three) times daily.    [provider]  CALCIUM CARBONATE PO Take 1 tablet by mouth daily. Patient not taking: Reported on 10/01/2020    [provider]  cetirizine (ZYRTEC) 10 MG tablet Take 10 mg by  mouth daily.    [provider]  Cholecalciferol (VITAMIN D3) 1000 units CAPS Take 1 capsule by mouth daily.    [provider]  diazepam (VALIUM) 5 MG tablet Take 5 mg by mouth at bedtime. Patient takes once at night Reported on 10/16/2015    [provider]  docusate sodium (COLACE) 100 MG capsule Take 100 mg by mouth 2 (two) times daily as needed for mild constipation.    [provider]  escitalopram (LEXAPRO) 5 MG tablet Take 10 mg by mouth daily.     [provider]  Fluticasone-Salmeterol (ADVAIR) 250-50 MCG/DOSE AEPB Inhale 1 puff into the lungs 2 (two) times daily. Patient not taking: Reported on 10/01/2020    [provider]  gabapentin (NEURONTIN) 300 MG capsule Take 1 capsule (300 mg total) by mouth 2 (two) times daily. 09/18/20   Lurene Shadow, MD  HYDROcodone-acetaminophen (NORCO/VICODIN) 5-325 MG tablet Take 1 tablet by mouth 5 (five) times daily as needed for moderate pain. Do not exceed 5 times daily, patient at least 3 day    [provider]  Ipratropium-Albuterol (COMBIVENT RESPIMAT) 20-100 MCG/ACT AERS respimat Inhale 1 puff into the lungs every 6 (six) hours.    [provider]  losartan (COZAAR) 25 MG tablet Take 25 mg by mouth daily. 08/15/20   [provider]  losartan (COZAAR) 50 MG tablet Take 50 mg by mouth daily.    [provider]  lovastatin (MEVACOR) 20 MG tablet Take 20 mg by mouth at bedtime. Reported on 12/12/2015    [provider]  metoprolol succinate (TOPROL-XL) 25 MG 24 hr tablet Take 25 mg by mouth daily.    [provider]  montelukast (SINGULAIR) 10 MG tablet Take 10 mg by mouth at bedtime. Reported on 12/12/2015    [provider]  Roflumilast (DALIRESP) 250 MCG TABS Take 1 tablet by mouth daily. 10/01/20   [provider]  tamsulosin (FLOMAX) 0.4 MG CAPS capsule Take 0.4 mg by mouth daily after supper.    [provider]  TRELEGY  ELLIPTA 100-62.5-25 MCG/INH AEPB Inhale 1 puff into the lungs daily. 08/15/20   [provider]  vitamin B-12 (CYANOCOBALAMIN) 1000 MCG tablet Take 1,000 mcg by mouth daily.    [provider]    Physical Exam: Vitals:   12/07/22 1214 12/07/22 1215 12/07/22 1216  BP:  (!) 150/64   Pulse:  87   Resp:   16  Temp:   98 F (36.7 C)  SpO2:   100%  Weight: 50.8 kg    Height: 5\' 7"  (1.702 m)     General: Not in acute distress.  Thin body habitus HEENT:       Eyes: PERRL, EOMI, no jaundice       ENT: No discharge from the ears and nose, no pharynx injection, no tonsillar enlargement.        Neck: No JVD, no bruit,  no mass felt. Heme: No neck lymph node enlargement. Cardiac: S1/S2, RRR, No murmurs, No gallops or rubs. Respiratory: No rales, wheezing, rhonchi or rubs. GI: Soft, nondistended, nontender, no rebound pain, no organomegaly, BS present. GU: No hematuria Ext: No pitting leg edema bilaterally. 1+DP/PT pulse bilaterally. Musculoskeletal: Has left shoulder tenderness Skin: No rashes.  Neuro: Alert, oriented X3, cranial nerves II-XII grossly intact, moves all extremities  Psych: Patient is not psychotic, no suicidal or hemocidal ideation.  Labs on Admission: I have personally reviewed following labs and imaging studies  CBC: Recent Labs  Lab 12/07/22 1300  WBC 11.9*  NEUTROABS 9.4*  HGB 11.8*  HCT 37.6  MCV 102.7*  PLT 189   Basic Metabolic Panel: Recent Labs  Lab 12/07/22 1300  NA 137  K 4.1  CL 104  CO2 26  GLUCOSE 112*  BUN 21  CREATININE 0.70  CALCIUM 8.9   GFR: Estimated Creatinine Clearance: 42 mL/min (by C-G formula based on SCr of 0.7 mg/dL). Liver Function Tests: Recent Labs  Lab 12/07/22 1300  AST 19  ALT 11  ALKPHOS 49  BILITOT 1.2  PROT 6.1*  ALBUMIN 3.4*   No results for input(s): "LIPASE", "AMYLASE" in the last 168 hours. No results for input(s): "AMMONIA" in the last 168 hours. Coagulation Profile: No results for  input(s): "INR", "PROTIME" in the last 168 hours. Cardiac Enzymes: No results for input(s): "CKTOTAL", "CKMB", "CKMBINDEX", "TROPONINI" in the last 168 hours. BNP (last 3 results) No results for input(s): "PROBNP" in the last 8760 hours. HbA1C: No results for input(s): "HGBA1C" in the last 72 hours. CBG: No results for input(s): "GLUCAP" in the last 168 hours. Lipid Profile: No results for input(s): "CHOL", "HDL", "LDLCALC", "TRIG", "CHOLHDL", "LDLDIRECT" in the last 72 hours. Thyroid Function Tests: No results for input(s): "TSH", "T4TOTAL", "FREET4", "T3FREE", "THYROIDAB" in the last 72 hours. Anemia Panel: No results for input(s): "VITAMINB12", "FOLATE", "FERRITIN", "TIBC", "IRON", "RETICCTPCT" in the last 72 hours. Urine analysis:    Component Value Date/Time   COLORURINE STRAW (A) 12/07/2022 1412   APPEARANCEUR CLEAR (A) 12/07/2022 1412   APPEARANCEUR Cloudy 09/08/2014 1405   LABSPEC 1.005 12/07/2022 1412   LABSPEC 1.026 09/08/2014 1405   PHURINE 6.0 12/07/2022 1412   GLUCOSEU NEGATIVE 12/07/2022 1412   GLUCOSEU Negative 09/08/2014 1405   HGBUR NEGATIVE 12/07/2022 1412   BILIRUBINUR NEGATIVE 12/07/2022 1412   BILIRUBINUR Negative 09/08/2014 1405   KETONESUR NEGATIVE 12/07/2022 1412   PROTEINUR NEGATIVE 12/07/2022 1412   NITRITE POSITIVE (A) 12/07/2022 1412   LEUKOCYTESUR MODERATE (A) 12/07/2022 1412   LEUKOCYTESUR 3+ 09/08/2014 1405   Sepsis Labs: @LABRCNTIP (procalcitonin:4,lacticidven:4) )No results found for this or any previous visit (from the past 240 hour(s)).   Radiological Exams on Admission: DG Pelvis 1-2 Views  Result Date: 12/07/2022 CLINICAL DATA:  Pain after fall EXAM: PELVIS - 1 VIEW COMPARISON:  None Available. FINDINGS: Osteopenia. Chronic appearing deformity of the left iliac crest laterally with hypertrophic changes and deformity. Overall preserved hip joint spaces. Degenerative changes of the sacroiliac joints. No acute fracture or dislocation.  Prominent vascular calcifications identified with vascular stents in the pelvis. Fixation hardware along the lumbar spine. Overall with this level of osteopenia a subtle nondisplaced injury is difficult to completely exclude and if needed additional workup as clinically directed with CT or MRI IMPRESSION: Severe osteopenia with chronic changes.  Degenerative changes. Electronically Signed   By: Karen Kays M.D.   On: 12/07/2022 14:02   DG Chest 2 View  Result  Date: 12/07/2022 CLINICAL DATA:  Pain after fall EXAM: CHEST - 2 VIEW COMPARISON:  None Available. FINDINGS: Hyperinflation. No consolidation, pneumothorax or effusion. Normal cardiopericardial silhouette without edema. Calcified tortuous aorta. Osteopenia. Old right-sided rib fractures. Degenerative changes of the spine. Lateral view is limited as the left arm was at the patient's side but there is a left proximal humeral fracture. Please see separate humerus x-rays IMPRESSION: Hyperinflation.  No acute cardiopulmonary disease. Electronically Signed   By: Karen Kays M.D.   On: 12/07/2022 14:00   DG Humerus Left  Result Date: 12/07/2022 CLINICAL DATA:  Pain after fall EXAM: LEFT HUMERUS - 2 VIEW COMPARISON:  None Available. FINDINGS: There is a comminuted fracture of the left humeral neck. Mild displacement. No additional fracture or dislocation. Osteopenia. Degenerative changes seen of the [facility] joint. There is some widening of the space between the humeral head in the acromion. Possible joint effusion. IMPRESSION: Osteopenia. Comminuted mildly displaced fracture of the humeral neck. Degenerative changes of the [facility] joint Electronically Signed   By: Karen Kays M.D.   On: 12/07/2022 14:00   CT HEAD WO CONTRAST ( )  Result Date: 12/07/2022 CLINICAL DATA:  Head trauma, moderate-severe; Neck trauma (Age >= 65y) EXAM: CT HEAD WITHOUT CONTRAST CT CERVICAL SPINE WITHOUT CONTRAST TECHNIQUE: Multidetector CT imaging of the head and cervical spine was  performed following the standard protocol without intravenous contrast. Multiplanar CT image reconstructions of the cervical spine were also generated. RADIATION DOSE REDUCTION: This exam was performed according to the departmental dose-optimization program which includes automated exposure control, adjustment of the mA and/or kV according to patient size and/or use of iterative reconstruction technique. COMPARISON:  CT Head 09/16/20 FINDINGS: CT HEAD FINDINGS Brain: No evidence of acute infarction, hemorrhage, hydrocephalus, extra-axial collection or mass lesion/mass effect. There is sequela of moderate chronic microvascular ischemic change. Vascular: No hyperdense vessel or unexpected calcification. Skull: Normal. Negative for fracture or focal lesion. Sinuses/Orbits: Trace right mastoid effusion. No middle ear effusion. Paranasal sinuses are clear. Bilateral lens replacement. Orbits are otherwise unremarkable. Other: None. CT CERVICAL SPINE FINDINGS Alignment: Straightening of the normal cervical lordosis. Grade 1 anterolisthesis of C3 on C4. Trace retrolisthesis of C5 on C6. Skull base and vertebrae: No acute fracture. No primary bone lesion or focal pathologic process. Soft tissues and spinal canal: No prevertebral fluid or swelling. No visible canal hematoma. Severe atherosclerotic plaque of the carotid bifurcation bilaterally. Disc levels: Evidence of high-grade spinal canal stenosis. Mild multilevel degenerative changes throughout the cervical spine. Upper chest: Moderate centrilobular emphysema. Other: None IMPRESSION: 1. No acute intracranial abnormality. 2. No acute fracture or traumatic malalignment of the cervical spine. 3. Severe atherosclerotic plaque of the carotid bifurcation bilaterally. Consider further evaluation with carotid ultrasound. 4. Moderate centrilobular emphysema. Emphysema (ICD10-J43.9). Electronically Signed   By: Lorenza Cambridge M.D.   On: 12/07/2022 13:59   CT Cervical Spine Wo  Contrast  Result Date: 12/07/2022 CLINICAL DATA:  Head trauma, moderate-severe; Neck trauma (Age >= 65y) EXAM: CT HEAD WITHOUT CONTRAST CT CERVICAL SPINE WITHOUT CONTRAST TECHNIQUE: Multidetector CT imaging of the head and cervical spine was performed following the standard protocol without intravenous contrast. Multiplanar CT image reconstructions of the cervical spine were also generated. RADIATION DOSE REDUCTION: This exam was performed according to the departmental dose-optimization program which includes automated exposure control, adjustment of the mA and/or kV according to patient size and/or use of iterative reconstruction technique. COMPARISON:  CT Head 09/16/20 FINDINGS: CT HEAD FINDINGS Brain: No evidence of acute  infarction, hemorrhage, hydrocephalus, extra-axial collection or mass lesion/mass effect. There is sequela of moderate chronic microvascular ischemic change. Vascular: No hyperdense vessel or unexpected calcification. Skull: Normal. Negative for fracture or focal lesion. Sinuses/Orbits: Trace right mastoid effusion. No middle ear effusion. Paranasal sinuses are clear. Bilateral lens replacement. Orbits are otherwise unremarkable. Other: None. CT CERVICAL SPINE FINDINGS Alignment: Straightening of the normal cervical lordosis. Grade 1 anterolisthesis of C3 on C4. Trace retrolisthesis of C5 on C6. Skull base and vertebrae: No acute fracture. No primary bone lesion or focal pathologic process. Soft tissues and spinal canal: No prevertebral fluid or swelling. No visible canal hematoma. Severe atherosclerotic plaque of the carotid bifurcation bilaterally. Disc levels: Evidence of high-grade spinal canal stenosis. Mild multilevel degenerative changes throughout the cervical spine. Upper chest: Moderate centrilobular emphysema. Other: None IMPRESSION: 1. No acute intracranial abnormality. 2. No acute fracture or traumatic malalignment of the cervical spine. 3. Severe atherosclerotic plaque of the  carotid bifurcation bilaterally. Consider further evaluation with carotid ultrasound. 4. Moderate centrilobular emphysema. Emphysema (ICD10-J43.9). Electronically Signed   By: Lorenza Cambridge M.D.   On: 12/07/2022 13:59      Assessment/Plan Principal Problem:   UTI (urinary tract infection) Active Problems:   Fall at home, initial encounter   Closed fracture of neck of left humerus   Chronic obstructive pulmonary disease (HCC)   HLD (hyperlipidemia)   HTN (hypertension)   Memory loss   Depression with anxiety   Protein-calorie malnutrition, severe (HCC)   Assessment and Plan:  UTI (urinary tract infection): Not septic. -place in med-surg bed for obs -IV Rocephin -Follow-up urine culture  Fall at home, initial encounter: -Fall precaution -PT/OT  Closed fracture of neck of left humerus: Consulted Dr. Joice Lofts of orthopedic surgery, he recommended conservative treatment -Place sling immobilizer to left shoulder -As needed fentanyl, oxycodone, Tylenol as needed for pain -Lidoderm patch -As needed Robaxin  Chronic obstructive pulmonary disease (HCC): Stable -Bronchodilators and as needed Mucinex  HLD (hyperlipidemia) -Lipitor  HTN (hypertension) -IV hydralazine as needed -Metoprolol  Memory loss -Namenda  Depression with anxiety -Lexapro  Protein-calorie malnutrition, severe (HCC): Body weight 50.8 kg, BMI 17.53 -Ensure -Nutrition consult      DVT ppx: SCD  Code Status: DNR per pt and her brother  Family Communication:  Yes, patient's brother at bed side.     Disposition Plan:  Anticipate discharge to rehab  Consults called:  Dr. Joice Lofts of ortho  Admission status and Level of care: Med-Surg:  for obs       Dispo: The patient is from: Home              Anticipated d/c is to:  likely need rehab              Anticipated d/c date is: 1 day              Patient currently is not medically stable to d/c.    Severity of Illness:  The appropriate patient  status for this patient is OBSERVATION. Observation status is judged to be reasonable and necessary in order to provide the required intensity of service to ensure the patient's safety. The patient's presenting symptoms, physical exam findings, and initial radiographic and laboratory data in the context of their medical condition is felt to place them at decreased risk for further clinical deterioration. Furthermore, it is anticipated that the patient will be medically stable for discharge from the hospital within 2 midnights of admission.  Date of Service 12/07/2022    Lorretta Harp [facility]   If 7PM-7AM, please contact night-coverage [email or web address] 12/07/2022, 3:52 PM

## 2022-12-07 NOTE — ED Provider Notes (Signed)
[facility] Provider Note    Event Date/Time   First MD Initiated Contact with Patient 12/07/22 1209     (approximate)   History   Fall   HPI  Bethany [name] is a 87 y.o. female  here with fall, weakness. Pt states that she fell at home, lives with one family member. She says she just felt weak and her legs gave out. She has had increasing pain, swelling of her left arm since then. Reports she has been otherwise well. Denies any headache, neck pain. No focal numbness or weakness. No other complaints.       Physical Exam   Triage Vital Signs: ED Triage Vitals  Enc Vitals Group     BP 12/07/22 1215 (!) 150/64     Pulse Rate 12/07/22 1215 87     Resp 12/07/22 1216 16     Temp 12/07/22 1216 98 F (36.7 C)     Temp src --      SpO2 12/07/22 1216 100 %     Weight 12/07/22 1214 111 lb 14.4 oz (50.8 kg)     Height 12/07/22 1214 5\' 7"  (1.702 m)     Head Circumference --      Peak Flow --      Pain Score 12/07/22 1213 8     Pain Loc --      Pain Edu? --      Excl. in GC? --     Most recent vital signs: Vitals:   12/07/22 1654 12/07/22 1941  BP: (!) 144/64 (!) 109/55  Pulse: 79 81  Resp: 18 18  Temp: 98 F (36.7 C) 98 F (36.7 C)  SpO2: 100% 98%     General: Awake, no distress.  CV:  Good peripheral perfusion.  Resp:  Normal work of breathing. Lungs clear. Abd:  No distention. No tenderness. Other:  Significant bruising and ecchymoses of proximal upper arm with swelling. No open wounds. No deformity. 2+ radial pulse. Strength/sensation intact.   ED Results / Procedures / Treatments   Labs (all labs ordered are listed, but only abnormal results are displayed) Labs Reviewed  CBC WITH DIFFERENTIAL/PLATELET - Abnormal; Notable for the following components:      Result Value   WBC 11.9 (*)    RBC 3.66 (*)    Hemoglobin 11.8 (*)    MCV 102.7 (*)    Neutro Abs 9.4 (*)    Monocytes Absolute 1.1 (*)    All other components within normal  limits  COMPREHENSIVE METABOLIC PANEL - Abnormal; Notable for the following components:   Glucose, Bld 112 (*)    Total Protein 6.1 (*)    Albumin 3.4 (*)    All other components within normal limits  URINALYSIS, ROUTINE W REFLEX MICROSCOPIC - Abnormal; Notable for the following components:   Color, Urine STRAW (*)    APPearance CLEAR (*)    Nitrite POSITIVE (*)    Leukocytes,Ua MODERATE (*)    Bacteria, UA MANY (*)    All other components within normal limits  URINE CULTURE  PROTIME-INR  APTT  BASIC METABOLIC PANEL  CBC  TYPE AND SCREEN     EKG Normal sinus rhythm, VR 85. PR 145, QRS 96, QTc 433. No acute st elevations or depressions.   RADIOLOGY DG Pelvis: Negative CT Head/C Spine: Negative DG Humerus left: Comminuted humerus fx  I also independently reviewed and agree with radiologist interpretations.   PROCEDURES:  Critical Care performed: No  .  1-3 Lead EKG Interpretation  Performed by: Shaune Pollack, MD Authorized by: Shaune Pollack, MD     Interpretation: normal     ECG rate:  80-90   ECG rate assessment: normal     Rhythm: sinus rhythm     Ectopy: none     Conduction: normal   Comments:     Indication: Fall     MEDICATIONS ORDERED IN ED: Medications  fentaNYL (SUBLIMAZE) injection 12.5 mcg (has no administration in time range)  oxyCODONE-acetaminophen (PERCOCET/ROXICET) 5-325 MG per tablet 1 tablet (1 tablet Oral Given 12/07/22 1718)  methocarbamol (ROBAXIN) tablet 500 mg (has no administration in time range)  lidocaine (LIDODERM) 5 % 1 patch (1 patch Transdermal Patch Applied 12/07/22 1529)  acetaminophen (TYLENOL) tablet 650 mg (has no administration in time range)  ondansetron (ZOFRAN) injection 4 mg (has no administration in time range)  hydrALAZINE (APRESOLINE) injection 5 mg (has no administration in time range)  albuterol (PROVENTIL) (2.5 MG/3ML) 0.083% nebulizer solution 3 mL (has no administration in time range)   dextromethorphan-guaiFENesin (MUCINEX DM) 30-600 MG per 12 hr tablet 1 tablet (has no administration in time range)  cefTRIAXone (ROCEPHIN) 1 g in sodium chloride 0.9 % 100 mL IVPB (has no administration in time range)  senna-docusate (Senokot-S) tablet 1 tablet (has no administration in time range)  feeding supplement (ENSURE ENLIVE / ENSURE PLUS) liquid 237 mL (237 mLs Oral Given 12/07/22 1718)  enoxaparin (LOVENOX) injection 40 mg (40 mg Subcutaneous Given 12/07/22 1717)  atorvastatin (LIPITOR) tablet 40 mg (40 mg Oral Given 12/07/22 1719)  metoprolol succinate (TOPROL-XL) 24 hr tablet 25 mg (25 mg Oral Given 12/07/22 1718)  escitalopram (LEXAPRO) tablet 5 mg (5 mg Oral Given 12/07/22 1718)  memantine (NAMENDA) tablet 10 mg (has no administration in time range)  tamsulosin (FLOMAX) capsule 0.4 mg (0.4 mg Oral Given 12/07/22 1719)  cholecalciferol (VITAMIN D3) 25 MCG (1000 UNIT) tablet 1,000 Units (1,000 Units Oral Given 12/07/22 1717)  roflumilast (DALIRESP) tablet 250 mcg (has no administration in time range)  fluticasone furoate-vilanterol (BREO ELLIPTA) 100-25 MCG/ACT 1 puff (has no administration in time range)    And  umeclidinium bromide (INCRUSE ELLIPTA) 62.5 MCG/ACT 1 puff (has no administration in time range)  acetaminophen (TYLENOL) tablet 1,000 mg (1,000 mg Oral Given 12/07/22 1426)  oxyCODONE (Oxy IR/ROXICODONE) immediate release tablet 2.5 mg (2.5 mg Oral Given 12/07/22 1426)  cefTRIAXone (ROCEPHIN) 2 g in sodium chloride 0.9 % 100 mL IVPB (0 g Intravenous Stopped 12/07/22 1618)     IMPRESSION / MDM / ASSESSMENT AND PLAN / ED COURSE  I reviewed the triage vital signs and the nursing notes.                              Differential diagnosis includes, but is not limited to, mechanical fall, fall 2/2 general medical condition such as anemia, dehydration, occult infection (UTI, PNA), humerus fx  Patient's presentation is most consistent with acute presentation with potential threat to life or  bodily function.  The patient is on the cardiac monitor to evaluate for evidence of arrhythmia and/or significant heart rate changes   87 yo F with h/o HTN, HLD, COPD, lives on her own mostly, here with fall and arm pain. Re: arm pain - imaging shows comminuted humeral fx. Dr. Joice Lofts of ortho consulted, placed in sling immobilizer at this time. Otherwise, pt has likely UTI, will admit for pain control, management, and likely need  for PT/placement.    FINAL CLINICAL IMPRESSION(S) / ED DIAGNOSES   Final diagnoses:  Lower urinary tract infectious disease  Closed displaced fracture of surgical neck of left humerus, unspecified fracture morphology, initial encounter     Rx / DC Orders   ED Discharge Orders     None        Note:  This document was prepared using Dragon voice recognition software and may include unintentional dictation errors.   Shaune Pollack, MD 12/07/22 2002

## 2022-12-07 NOTE — Consult Note (Signed)
ORTHOPAEDIC CONSULTATION  REQUESTING PHYSICIAN: Lorretta Harp, MD  Chief Complaint:   Left shoulder pain.  History of Present Illness: Bethany [name] is a 87 y.o. female with multiple medical problems including dementia, oxygen dependent COPD, hypertension, hyperlipidemia, and anxiety who lives independently.  The patient apparently was in her usual state of health until this morning when she was found on the kitchen floor by her brother with obvious swelling and bruising in the left shoulder.  The patient was brought to the emergency room where x-rays demonstrated a minimally displaced proximal humerus fracture.  The patient has been placed into a shoulder immobilizer by the ER provider.  The patient denies any associated injuries.  She did not strike her head or lose consciousness.  The patient also denies any lightheadedness, dizziness, chest pain, shortness of breath, or other symptoms which may have precipitated her fall.  However, she is a poor historian, given her baseline dementia.  Past Medical History:  Diagnosis Date   Allergy    Anxiety    Arthritis    COPD (chronic obstructive pulmonary disease) (HCC)    Hyperlipidemia    Hypertension    Oxygen deficiency    Past Surgical History:  Procedure Laterality Date   ABDOMINAL HYSTERECTOMY     SPINE SURGERY     Back surgery x8   Social History   Socioeconomic History   Marital status: Single    Spouse name: Not on file   Number of children: Not on file   Years of education: Not on file   Highest education level: Not on file  Occupational History   Not on file  Tobacco Use   Smoking status: Former    Packs/day: 1.50    Years: 30.00    Additional pack years: 0.00    Total pack years: 45.00    Types: Cigarettes    Quit date: 10/06/2014    Years since quitting: 8.1   Smokeless tobacco: Never  Vaping Use   Vaping Use: Never used  Substance and Sexual Activity    Alcohol use: No   Drug use: No   Sexual activity: Not on file  Other Topics Concern   Not on file  Social History Narrative   Not on file   Social Determinants of Health   Financial Resource Strain: Not on file  Food Insecurity: Not on file  Transportation Needs: Not on file  Physical Activity: Not on file  Stress: Not on file  Social Connections: Not on file   Family History  Problem Relation Age of Onset   Breast cancer Maternal Aunt    Heart disease Mother    ALS Father    Heart disease Father    Lymphoma Brother    Allergies  Allergen Reactions   Penicillins Other (See Comments)    Patient unsure   Sulfa Antibiotics Itching   Codeine Itching and Nausea Only   Prior to Admission medications   Medication Sig Start Date End Date Taking? Authorizing Provider  atorvastatin (LIPITOR) 40 MG tablet Take 40 mg by mouth daily. 05/05/22  Yes [provider]  Cholecalciferol (VITAMIN D3) 1000 units CAPS Take 1 capsule by mouth daily.   Yes [provider]  docusate sodium (COLACE) 100 MG capsule Take 100 mg by mouth 2 (two) times daily as needed for mild constipation.   Yes [provider]  escitalopram (LEXAPRO) 5 MG tablet Take 5 mg by mouth daily.   Yes [provider]  HYDROcodone-acetaminophen (NORCO/VICODIN) 5-325  MG tablet Take 1 tablet by mouth 5 (five) times daily as needed for moderate pain. Do not exceed 5 times daily, patient at least 3 day   Yes [provider]  memantine (NAMENDA) 10 MG tablet Take 10 mg by mouth 2 (two) times daily. 10/01/22  Yes [provider]  metoprolol succinate (TOPROL-XL) 25 MG 24 hr tablet Take 25 mg by mouth daily.   Yes [provider]  Roflumilast (DALIRESP) 250 MCG TABS Take 250 mcg by mouth daily. 10/01/20  Yes [provider]  tamsulosin (FLOMAX) 0.4 MG CAPS capsule Take 0.4 mg by mouth daily after supper.   Yes [provider]  TRELEGY ELLIPTA 100-62.5-25  MCG/INH AEPB Inhale 1 puff into the lungs daily. 08/15/20  Yes [provider]  albuterol (PROVENTIL HFA;VENTOLIN HFA) 108 (90 Base) MCG/ACT inhaler Inhale 2 puffs into the lungs every 6 (six) hours as needed for wheezing or shortness of breath. Patient not taking: Reported on 12/07/2022 03/23/16   Emily Filbert, MD  gabapentin (NEURONTIN) 300 MG capsule Take 1 capsule (300 mg total) by mouth 2 (two) times daily. Patient not taking: Reported on 12/07/2022 09/18/20   Lurene Shadow, MD   DG Pelvis 1-2 Views  Result Date: 12/07/2022 CLINICAL DATA:  Pain after fall EXAM: PELVIS - 1 VIEW COMPARISON:  None Available. FINDINGS: Osteopenia. Chronic appearing deformity of the left iliac crest laterally with hypertrophic changes and deformity. Overall preserved hip joint spaces. Degenerative changes of the sacroiliac joints. No acute fracture or dislocation. Prominent vascular calcifications identified with vascular stents in the pelvis. Fixation hardware along the lumbar spine. Overall with this level of osteopenia a subtle nondisplaced injury is difficult to completely exclude and if needed additional workup as clinically directed with CT or MRI IMPRESSION: Severe osteopenia with chronic changes.  Degenerative changes. Electronically Signed   By: Karen Kays M.D.   On: 12/07/2022 14:02   DG Chest 2 View  Result Date: 12/07/2022 CLINICAL DATA:  Pain after fall EXAM: CHEST - 2 VIEW COMPARISON:  None Available. FINDINGS: Hyperinflation. No consolidation, pneumothorax or effusion. Normal cardiopericardial silhouette without edema. Calcified tortuous aorta. Osteopenia. Old right-sided rib fractures. Degenerative changes of the spine. Lateral view is limited as the left arm was at the patient's side but there is a left proximal humeral fracture. Please see separate humerus x-rays IMPRESSION: Hyperinflation.  No acute cardiopulmonary disease. Electronically Signed   By: Karen Kays M.D.   On: 12/07/2022  14:00   DG Humerus Left  Result Date: 12/07/2022 CLINICAL DATA:  Pain after fall EXAM: LEFT HUMERUS - 2 VIEW COMPARISON:  None Available. FINDINGS: There is a comminuted fracture of the left humeral neck. Mild displacement. No additional fracture or dislocation. Osteopenia. Degenerative changes seen of the [facility] joint. There is some widening of the space between the humeral head in the acromion. Possible joint effusion. IMPRESSION: Osteopenia. Comminuted mildly displaced fracture of the humeral neck. Degenerative changes of the [facility] joint Electronically Signed   By: Karen Kays M.D.   On: 12/07/2022 14:00   CT HEAD WO CONTRAST ( )  Result Date: 12/07/2022 CLINICAL DATA:  Head trauma, moderate-severe; Neck trauma (Age >= 65y) EXAM: CT HEAD WITHOUT CONTRAST CT CERVICAL SPINE WITHOUT CONTRAST TECHNIQUE: Multidetector CT imaging of the head and cervical spine was performed following the standard protocol without intravenous contrast. Multiplanar CT image reconstructions of the cervical spine were also generated. RADIATION DOSE REDUCTION: This exam was performed according to the departmental dose-optimization program which  includes automated exposure control, adjustment of the mA and/or kV according to patient size and/or use of iterative reconstruction technique. COMPARISON:  CT Head 09/16/20 FINDINGS: CT HEAD FINDINGS Brain: No evidence of acute infarction, hemorrhage, hydrocephalus, extra-axial collection or mass lesion/mass effect. There is sequela of moderate chronic microvascular ischemic change. Vascular: No hyperdense vessel or unexpected calcification. Skull: Normal. Negative for fracture or focal lesion. Sinuses/Orbits: Trace right mastoid effusion. No middle ear effusion. Paranasal sinuses are clear. Bilateral lens replacement. Orbits are otherwise unremarkable. Other: None. CT CERVICAL SPINE FINDINGS Alignment: Straightening of the normal cervical lordosis. Grade 1 anterolisthesis of C3 on C4. Trace  retrolisthesis of C5 on C6. Skull base and vertebrae: No acute fracture. No primary bone lesion or focal pathologic process. Soft tissues and spinal canal: No prevertebral fluid or swelling. No visible canal hematoma. Severe atherosclerotic plaque of the carotid bifurcation bilaterally. Disc levels: Evidence of high-grade spinal canal stenosis. Mild multilevel degenerative changes throughout the cervical spine. Upper chest: Moderate centrilobular emphysema. Other: None IMPRESSION: 1. No acute intracranial abnormality. 2. No acute fracture or traumatic malalignment of the cervical spine. 3. Severe atherosclerotic plaque of the carotid bifurcation bilaterally. Consider further evaluation with carotid ultrasound. 4. Moderate centrilobular emphysema. Emphysema (ICD10-J43.9). Electronically Signed   By: Lorenza Cambridge M.D.   On: 12/07/2022 13:59   CT Cervical Spine Wo Contrast  Result Date: 12/07/2022 CLINICAL DATA:  Head trauma, moderate-severe; Neck trauma (Age >= 65y) EXAM: CT HEAD WITHOUT CONTRAST CT CERVICAL SPINE WITHOUT CONTRAST TECHNIQUE: Multidetector CT imaging of the head and cervical spine was performed following the standard protocol without intravenous contrast. Multiplanar CT image reconstructions of the cervical spine were also generated. RADIATION DOSE REDUCTION: This exam was performed according to the departmental dose-optimization program which includes automated exposure control, adjustment of the mA and/or kV according to patient size and/or use of iterative reconstruction technique. COMPARISON:  CT Head 09/16/20 FINDINGS: CT HEAD FINDINGS Brain: No evidence of acute infarction, hemorrhage, hydrocephalus, extra-axial collection or mass lesion/mass effect. There is sequela of moderate chronic microvascular ischemic change. Vascular: No hyperdense vessel or unexpected calcification. Skull: Normal. Negative for fracture or focal lesion. Sinuses/Orbits: Trace right mastoid effusion. No middle ear  effusion. Paranasal sinuses are clear. Bilateral lens replacement. Orbits are otherwise unremarkable. Other: None. CT CERVICAL SPINE FINDINGS Alignment: Straightening of the normal cervical lordosis. Grade 1 anterolisthesis of C3 on C4. Trace retrolisthesis of C5 on C6. Skull base and vertebrae: No acute fracture. No primary bone lesion or focal pathologic process. Soft tissues and spinal canal: No prevertebral fluid or swelling. No visible canal hematoma. Severe atherosclerotic plaque of the carotid bifurcation bilaterally. Disc levels: Evidence of high-grade spinal canal stenosis. Mild multilevel degenerative changes throughout the cervical spine. Upper chest: Moderate centrilobular emphysema. Other: None IMPRESSION: 1. No acute intracranial abnormality. 2. No acute fracture or traumatic malalignment of the cervical spine. 3. Severe atherosclerotic plaque of the carotid bifurcation bilaterally. Consider further evaluation with carotid ultrasound. 4. Moderate centrilobular emphysema. Emphysema (ICD10-J43.9). Electronically Signed   By: Lorenza Cambridge M.D.   On: 12/07/2022 13:59    Positive ROS: All other systems have been reviewed and were otherwise negative with the exception of those mentioned in the HPI and as above.  Physical Exam: General:  Alert, no acute distress Psychiatric:  Patient is not competent for consent, but exhibits normal mood and affect   Cardiovascular:  No pedal edema Respiratory:  No wheezing, non-labored breathing GI:  Abdomen is soft and non-tender Skin:  No lesions in the area of chief complaint Neurologic:  Sensation intact distally Lymphatic:  No axillary or cervical lymphadenopathy  Orthopedic Exam:  Orthopedic examination is limited to the left shoulder and upper extremity.  Moderate swelling and extensive ecchymosis is noted throughout the shoulder and upper arm extending down to her elbow.  She has moderate tenderness to palpation of the anterior and lateral aspects of  the shoulder.  She has more severe pain with any attempted active or passive motion of the shoulder.  She is grossly neurovascularly intact to the left upper extremity and hand.  X-rays:  Recent AP and lateral x-rays of the left humerus are available for review and have been reviewed by myself.  The findings are as described above.  These films demonstrate a mildly impacted left proximal humeral fracture with overall satisfactory maintenance of alignment.  The humeral head is appropriately located within the glenoid.  There is generalized osteopenia noted involving all of the visible bony structures.  No other acute bony abnormalities are identified.  Assessment: Mildly impacted left proximal humerus fracture.  Plan: The treatment options are discussed with the patient.  I feel that, given how well aligned the fracture is, this fracture can be managed nonsurgically in her shoulder immobilizer.  The patient will be in this immobilizer for about 4 weeks before we can begin formal therapy.  She is to avoid any weightbearing through the left hand or upper extremity before this time.  Therefore, she may require temporary rehab placement while she recuperates.  In addition, she may receive medication as deemed appropriate medically for any pain issues that she may have.  Thank you for asking me to participate in the care of this most pleasant and unfortunate woman.  I will be happy to follow her with you.   Maryagnes Amos, MD  Beeper #:  [phone]  12/07/2022 4:41 PM

## 2022-12-07 NOTE — ED Triage Notes (Signed)
Pt from home, brother found her on the floor. Hx of dementia and was found with old bruises. Obvious left shoulder bruising. Pt states she fell this morning with no LOC. No blood thinners. Pt pivoted from EMS stretcher to ER stretcher with mild assistance Vitals: CBG 174 BP 150/72 97.7 F temp HR 90 NSR

## 2022-12-07 NOTE — Progress Notes (Signed)
Patient received from ED via wheel chair in stable condition. Patient is alert and orientedX4,forgetful. Patient has bruises on her left shoulder and shoulder immobilizer applied.

## 2022-12-07 NOTE — ED Notes (Signed)
Pt sling applied. Tolerated well.

## 2022-12-08 DIAGNOSIS — N3000 Acute cystitis without hematuria: Secondary | ICD-10-CM | POA: Diagnosis not present

## 2022-12-08 LAB — TYPE AND SCREEN

## 2022-12-08 LAB — BASIC METABOLIC PANEL
Anion gap: 8 (ref 5–15)
BUN: 24 mg/dL — ABNORMAL HIGH (ref 8–23)
CO2: 28 mmol/L (ref 22–32)
Calcium: 8.9 mg/dL (ref 8.9–10.3)
Chloride: 104 mmol/L (ref 98–111)
Creatinine, Ser: 0.68 mg/dL (ref 0.44–1.00)
GFR, Estimated: >60 mL/min (ref >60)
Glucose, Bld: 100 mg/dL — ABNORMAL HIGH (ref 70–99)
Potassium: 3.8 mmol/L (ref 3.5–5.1)
Sodium: 140 mmol/L (ref 135–145)

## 2022-12-08 LAB — CBC
HCT: 31.8 % — ABNORMAL LOW (ref 36.0–46.0)
Hemoglobin: 10.3 g/dL — ABNORMAL LOW (ref 12.0–15.0)
MCH: 32.4 pg (ref 26.0–34.0)
MCHC: 32.4 g/dL (ref 30.0–36.0)
MCV: 100 fL (ref 80.0–100.0)
Platelets: 196 10*3/uL (ref 150–400)
RBC: 3.18 MIL/uL — ABNORMAL LOW (ref 3.87–5.11)
RDW: 12.9 % (ref 11.5–15.5)
WBC: 9.5 10*3/uL (ref 4.0–10.5)
nRBC: 0 % (ref 0.0–0.2)

## 2022-12-08 MED ORDER — PNEUMOCOCCAL 20-VAL CONJ VACC 0.5 ML IM SUSY
0.5000 mL | PREFILLED_SYRINGE | INTRAMUSCULAR | Status: AC
  Administered 2022-12-09: 0.5 mL via INTRAMUSCULAR
  Filled 2022-12-08: qty 0.5

## 2022-12-08 MED ORDER — ADULT MULTIVITAMIN W/MINERALS CH
1.0000 | ORAL_TABLET | Freq: Every day | ORAL | Status: DC
  Administered 2022-12-08 – 2022-12-14 (×7): 1 via ORAL
  Filled 2022-12-08 (×7): qty 1

## 2022-12-08 NOTE — Plan of Care (Signed)

## 2022-12-08 NOTE — [place] FL2 (Signed)
[place] MEDICAID FL2 LEVEL OF CARE FORM     IDENTIFICATION  Patient Name: Bethany [name] Birthdate: [birth year] Sex: female Admission Date (Current Location): 12/07/2022  [facility] and [place] Number:  Chiropodist and Address:  [facility], [address], [place], [place] [address]      Provider Number: [id number]  Attending Physician Name and Address:  Lanae Boast, MD  Relative Name and Phone Number:       Current Level of Care: Hospital Recommended Level of Care: Skilled Nursing Facility Prior Approval Number:    Date Approved/Denied:   PASRR Number: [id number]  Discharge Plan:      Current Diagnoses: Patient Active Problem List   Diagnosis Date Noted   UTI (urinary tract infection) 12/07/2022   HTN (hypertension) 12/07/2022   Depression with anxiety 12/07/2022   Protein-calorie malnutrition, severe (HCC) 12/07/2022   Fall at home, initial encounter 12/07/2022   Closed fracture of neck of left humerus 12/07/2022   Weight loss 11/13/2020   Memory loss 11/07/2020   Malnutrition of moderate degree 09/18/2020   CAP (community acquired pneumonia) 09/16/2020   Carotid stenosis 01/03/2017   PAD (peripheral artery disease) (HCC) 01/03/2017   Back pain, chronic 09/13/2015   Degenerative arthritis of lumbar spine 06/13/2015   Low serum cobalamin 03/21/2015   Anxiety 09/25/2014   Chronic obstructive pulmonary disease (HCC) 11/13/2013   BP (high blood pressure) 11/13/2013   HLD (hyperlipidemia) 11/13/2013    Orientation RESPIRATION BLADDER Height & Weight     Self, Time, Situation, Place  O2 Continent Weight: 111 lb 14.4 oz (50.8 kg) Height:  5\' 7"  (170.2 cm)  BEHAVIORAL SYMPTOMS/MOOD NEUROLOGICAL BOWEL NUTRITION STATUS      Continent    AMBULATORY STATUS COMMUNICATION OF NEEDS Skin   Limited Assist Verbally Normal                       Personal Care Assistance Level of Assistance  Bathing, Feeding, Dressing Bathing  Assistance: Limited assistance Feeding assistance: Limited assistance Dressing Assistance: Limited assistance     Functional Limitations Info  Sight, Hearing, Speech Sight Info: Impaired Hearing Info: Adequate Speech Info: Adequate    SPECIAL CARE FACTORS FREQUENCY  OT (By licensed OT), PT (By licensed PT)     PT Frequency: 5 times a week OT Frequency: 5 times a week            Contractures Contractures Info: Not present    Additional Factors Info  Code Status, Allergies Code Status Info: DNR Allergies Info: Penicillins  Sulfa Antibiotics  Codeine           Current Medications (12/08/2022):  This is the current hospital active medication list Current Facility-Administered Medications  Medication Dose Route Frequency Provider Last Rate Last Admin   acetaminophen (TYLENOL) tablet 650 mg  650 mg Oral Q6H PRN Lorretta Harp, MD       albuterol (PROVENTIL) (2.5 MG/3ML) 0.083% nebulizer solution 3 mL  3 mL Nebulization Q4H PRN Lorretta Harp, MD       atorvastatin (LIPITOR) tablet 40 mg  40 mg Oral QPM Lorretta Harp, MD   40 mg at 12/07/22 1719   cefTRIAXone (ROCEPHIN) 1 g in sodium chloride 0.9 % 100 mL IVPB  1 g Intravenous Q24H Lorretta Harp, MD       cholecalciferol (VITAMIN D3) 25 MCG (1000 UNIT) tablet 1,000 Units  1,000 Units Oral Daily Lorretta Harp, MD   1,000 Units at 12/08/22 [phone]  dextromethorphan-guaiFENesin (MUCINEX DM) 30-600 MG per 12 hr tablet 1 tablet  1 tablet Oral BID PRN Lorretta Harp, MD       enoxaparin (LOVENOX) injection 40 mg  40 mg Subcutaneous Q24H Lorretta Harp, MD   40 mg at 12/07/22 1717   escitalopram (LEXAPRO) tablet 5 mg  5 mg Oral Daily Lorretta Harp, MD   5 mg at 12/08/22 0854   feeding supplement (ENSURE ENLIVE / ENSURE PLUS) liquid 237 mL  237 mL Oral BID BM Lorretta Harp, MD   237 mL at 12/08/22 1438   fentaNYL (SUBLIMAZE) injection 12.5 mcg  12.5 mcg Intravenous Q3H PRN Lorretta Harp, MD   12.5 mcg at 12/07/22 2142   fluticasone furoate-vilanterol (BREO ELLIPTA) 100-25  MCG/ACT 1 puff  1 puff Inhalation Daily Lorretta Harp, MD       And   umeclidinium bromide (INCRUSE ELLIPTA) 62.5 MCG/ACT 1 puff  1 puff Inhalation Daily Lorretta Harp, MD       hydrALAZINE (APRESOLINE) injection 5 mg  5 mg Intravenous Q2H PRN Lorretta Harp, MD       lidocaine (LIDODERM) 5 % 1 patch  1 patch Transdermal Q24H Lorretta Harp, MD   1 patch at 12/07/22 1529   memantine (NAMENDA) tablet 10 mg  10 mg Oral BID Lorretta Harp, MD   10 mg at 12/08/22 0856   methocarbamol (ROBAXIN) tablet 500 mg  500 mg Oral Q8H PRN Lorretta Harp, MD       metoprolol succinate (TOPROL-XL) 24 hr tablet 25 mg  25 mg Oral Daily Lorretta Harp, MD   25 mg at 12/08/22 1610   multivitamin with minerals tablet 1 tablet  1 tablet Oral Daily Kc, Dayna Barker, MD   1 tablet at 12/08/22 1220   ondansetron (ZOFRAN) injection 4 mg  4 mg Intravenous Q8H PRN Lorretta Harp, MD       oxyCODONE-acetaminophen (PERCOCET/ROXICET) 5-325 MG per tablet 1 tablet  1 tablet Oral Q4H PRN Lorretta Harp, MD   1 tablet at 12/08/22 1448   roflumilast (DALIRESP) tablet 250 mcg  250 mcg Oral Daily Lorretta Harp, MD   250 mcg at 12/08/22 0856   senna-docusate (Senokot-S) tablet 1 tablet  1 tablet Oral QHS PRN Lorretta Harp, MD       tamsulosin [facility]) capsule 0.4 mg  0.4 mg Oral QPC supper Lorretta Harp, MD   0.4 mg at 12/07/22 1719     Discharge Medications: Please see discharge summary for a list of discharge medications.  Relevant Imaging Results:  Relevant Lab Results:   Additional Information SS [id number]  Allena Katz, LCSW

## 2022-12-08 NOTE — TOC Progression Note (Signed)
Transition of Care [facility]) - Progression Note    Patient Details  Name: Bethany [name] MRN: [id number] Date of Birth: 09/01/1935  Transition of Care [facility]) CM/SW Contact  Allena Katz, LCSW Phone Number: 12/08/2022, 3:22 PM  Clinical Narrative:   Referrals sent out for rehab.     Expected Discharge Plan: Home/Self Care Barriers to Discharge: Continued Medical Work up  Expected Discharge Plan and Services                                               Social Determinants of Health (SDOH) Interventions SDOH Screenings   Food Insecurity: No Food Insecurity (12/07/2022)  Housing: Patient Declined (12/07/2022)  Transportation Needs: No Transportation Needs (12/07/2022)  Utilities: Not At Risk (12/07/2022)  Tobacco Use: Medium Risk (12/07/2022)    Readmission Risk Interventions     No data to display

## 2022-12-08 NOTE — Progress Notes (Signed)
Initial Nutrition Assessment  DOCUMENTATION CODES:   Underweight, Non-severe (moderate) malnutrition in context of chronic illness  INTERVENTION:   -Liberalize diet to regular for widest variety of meal selections -Ensure Enlive po BID, each supplement provides 350 kcal and 20 grams of protein -MVI with minerals daily  NUTRITION DIAGNOSIS:   Moderate Malnutrition related to chronic illness (COPD) as evidenced by mild fat depletion, moderate fat depletion, moderate muscle depletion, severe muscle depletion, percent weight loss.  GOAL:   Patient will meet greater than or equal to 90% of their needs  MONITOR:   PO intake, Supplement acceptance  REASON FOR ASSESSMENT:   Consult Assessment of nutrition requirement/status  ASSESSMENT:   Pt with medical history significant of Hypertension, hyperlipidemia, COPD, CAD, memory loss, depression with anxiety, carotid artery stenosis, former smoker, who presents with fall and left shoulder pain.  Pt admitted with UTI.  Per orthopedics notes, pt with lt humerus fracture s/p fall. Plan for sling and non-operative treatment.   Spoke with pt, who was pleasant and in good spirits today. Pt sitting in recliner chair, just finished working with therapy team. Pt reports she had a good breakfast today, as she was hungry. She reports she has not been eating much as she doesn't feel very hungry. She usually consumes 2 meals per day (Lunch: sandwich; Dinner: meat, starch, and vegetable).   Pt endorses wt loss, but unsure of UBW or amount lost. Noted distant wt loss. Reviewed wt hx; per CareEverywhere, pt was 95# om 09/14/22. Noted wt gain since then.   Discussed importance of good meal and supplement intake to promote healing. Pt amenable to Ensure supplements.   Medications reviewed.  Labs reviewed.   NUTRITION - FOCUSED PHYSICAL EXAM:  Flowsheet Row Most Recent Value  Orbital Region Mild depletion  Upper Arm Region Moderate depletion   Thoracic and Lumbar Region Mild depletion  Buccal Region Mild depletion  Temple Region Moderate depletion  Clavicle Bone Region Severe depletion  Clavicle and Acromion Bone Region Severe depletion  Scapular Bone Region Severe depletion  Dorsal Hand Moderate depletion  Patellar Region Moderate depletion  Anterior Thigh Region Moderate depletion  Posterior Calf Region Moderate depletion  Edema (RD Assessment) None  Hair Reviewed  Eyes Reviewed  Mouth Reviewed  Skin Reviewed  Nails Reviewed       Diet Order:   Diet Order             Diet regular Fluid consistency: Thin  Diet effective now                   EDUCATION NEEDS:   Education needs have been addressed  Skin:  Skin Assessment: Reviewed RN Assessment  Last BM:  12/06/22  Height:   Ht Readings from Last 1 Encounters:  12/07/22 5\' 7"  (1.702 m)    Weight:   Wt Readings from Last 1 Encounters:  12/07/22 50.8 kg    Ideal Body Weight:  61.4 kg  BMI:  Body mass index is 17.53 kg/m.  Estimated Nutritional Needs:   Kcal:  1550-1750  Protein:  80-95 grams  Fluid:  > 1.6 L    Levada Schilling, RD, LDN, CDCES Registered Dietitian II Certified Diabetes Care and Education Specialist Please refer to [facility] for RD and/or RD on-call/weekend/after hours pager

## 2022-12-08 NOTE — Evaluation (Signed)
Occupational Therapy Evaluation Patient Details Name: Bethany [name] MRN: [id number] DOB: 18-Nov-1935 Today's Date: 12/08/2022   History of Present Illness 87 y.o. female with medical history significant of COPD, dementia, anxiety, HTN, CAD, and HLD who presents to the [facility] ED after found down on floor by brother. Imaging revealed a minimally displaced proximal L humerus fracture. Per orthopedic surgery, pt to remain in shoulder immobilizer x4 wks, LUE NWBing.   Clinical Impression   Pt was seen for OT evaluation this date. Pt received in recliner, pleasant, and agreeable. Prior to hospital admission, pt was living alone, indep with ADL and IADL (meal prep, med mgt, and limited driving) per pt report. No family present to verify pt report. OT attempted to call brother listed as contact in charge but no one answered. Pt alert and oriented to self, DOB, month/year, [facility], and states she's here because her "shoulder is acting up." Pt denies falls and when suggested that her brother found her down in the kitchen, pt stated "Well, he didn't tell me that!" Pt pleasant throughout, endorsing 4-5/10 L shoulder and chronic low back pain that does not worsen with activity. Pt presents to acute OT demonstrating impaired ADL performance and functional mobility 2/2 L shoulder and low back pain, decr balance, cognition, strength, and activity tolerance (See OT problem list for additional functional deficits). Pt currently requires incr time/effort to don/doff socks one handed from seated position. MIN-MOD A for LB dressing otherwise, MAX A for UB dressing, bathing, and sling mgt. OT noted sling in poor position, readjusted with improved comfort, fit, and joint protection/alignment. Pt completed ADL transfers from recliner, EOB, and std height toilet with CGA, ambulated in room and bathroom with CGA. All on 3L O2 with SpO2 >96%, HR 82-91. Pt endorsed 4/10 rate of perceived exertion afterwards. Pt instructed in sling mgt and  LUE precautions. Pt would benefit from skilled OT services to address noted impairments and functional limitations (see below for any additional details) in order to maximize safety and independence while minimizing falls risk and caregiver burden.    Recommendations for follow up therapy are one component of a multi-disciplinary discharge planning process, led by the attending physician.  Recommendations may be updated based on patient status, additional functional criteria and insurance authorization.   Assistance Recommended at Discharge Frequent or constant Supervision/Assistance  Patient can return home with the following A little help with walking and/or transfers;A lot of help with bathing/dressing/bathroom;Assistance with cooking/housework;Assist for transportation;Help with stairs or ramp for entrance;Direct supervision/assist for medications management    Functional Status Assessment  Patient has had a recent decline in their functional status and demonstrates the ability to make significant improvements in function in a reasonable and predictable amount of time.  Equipment Recommendations  None recommended by OT    Recommendations for Other Services       Precautions / Restrictions Precautions Precautions: Shoulder;Fall Shoulder Interventions: Shoulder sling/immobilizer;At all times Restrictions Weight Bearing Restrictions: Yes LUE Weight Bearing: Non weight bearing      Mobility Bed Mobility Overal bed mobility: Needs Assistance Bed Mobility: Sit to Supine       Sit to supine: Supervision        Transfers Overall transfer level: Needs assistance Equipment used: None Transfers: Sit to/from Stand Sit to Stand: Min guard                  Balance Overall balance assessment: Needs assistance Sitting-balance support: No upper extremity supported, Feet supported Sitting balance-Leahy Scale:  Good     Standing balance support: No upper extremity supported,  During functional activity Standing balance-Leahy Scale: Fair                             ADL either performed or assessed with clinical judgement   ADL                                         General ADL Comments: Pt able to doff/don sock from seated position with R hand with incr time and difficulty, MAX A for UB dressing and sling mgt. CGA for toilet transfer.     Vision         Perception     Praxis      Pertinent Vitals/Pain Pain Assessment Pain Assessment: 0-10 Pain Score: 4  Pain Location: L shoulder and low back (chronic) Pain Descriptors / Indicators: Aching Pain Intervention(s): Limited activity within patient's tolerance, Monitored during session, Repositioned, Patient requesting pain meds-RN notified, Premedicated before session     Hand Dominance     Extremity/Trunk Assessment Upper Extremity Assessment Upper Extremity Assessment: Generalized weakness;LUE deficits/detail LUE Deficits / Details: denies sensory deficits LUE: Unable to fully assess due to immobilization;Shoulder pain at rest   Lower Extremity Assessment Lower Extremity Assessment: Generalized weakness       Communication Communication Communication: No difficulties   Cognition Arousal/Alertness: Awake/alert Behavior During Therapy: WFL for tasks assessed/performed Overall Cognitive Status: History of cognitive impairments - at baseline                                 General Comments: oriented to self, place, date, unclear why she is here ("having truoble wiht my left arm for some reason:"     General Comments  SpO2 96-97% on 3L, HR 82-91    Exercises Other Exercises Other Exercises: Pt educated in LUE precautions, sling mgt including positioning, falls prevention   Shoulder Instructions      Home Living Family/patient expects to be discharged to:: Private residence Living Arrangements: Alone Available Help at Discharge:  Family;Available PRN/intermittently (brother and SIL - brother checks on her a couple times a week) Type of Home: House Home Access: Stairs to enter [employer] of Steps: 2 from front, 3 from back, neither with rails per pt (previous chart review notes bilateral) Entrance Stairs-Rails: None Home Layout: One level     Bathroom Shower/Tub: Walk-in shower;Tub/shower unit   [employer]: Standard     Home Equipment: Shower seat;Grab bars - tub/shower;Cane - single Librarian, academic (2 wheels);BSC/3in1;Wheelchair - manual          Prior Functioning/Environment Prior Level of Function : History of Falls (last six months);Patient poor historian/Family not available             Mobility Comments: Per pt she typically uses SPC, walker for outside, denies falls history; brother later on phone notes she does not use anything but should, brother thinks she may have 1-2 falls. ADLs Comments: Per pt she is indep with ADL, meal prep, med mgt, limited driving. Per her brother by phone, the pt hasn't driven in 2+ years, doesn't cook (brother provides meals), brother tries to manage medications with blister packs but pt still has trouble managing it. Her brother goes by to check on  her at least every other day. No other help really available and no 24/7 help available. Brother reports she smokes with her O2 on all the time, wears 2-3L O2 typically. She keeps the house at 80-85 degrees.        OT Problem List: Decreased strength;Pain;Decreased cognition;Decreased safety awareness;Decreased activity tolerance;Impaired balance (sitting and/or standing);Decreased knowledge of use of DME or AE;Impaired UE functional use;Decreased knowledge of precautions      OT Treatment/Interventions: Self-care/ADL training;Therapeutic exercise;Therapeutic activities;Cognitive remediation/compensation;DME and/or AE instruction;Patient/family education;Balance training    OT Goals(Current goals can be  found in the care plan section) Acute Rehab OT Goals Patient Stated Goal: go home OT Goal Formulation: With patient Time For Goal Achievement: 12/22/22 Potential to Achieve Goals: Good ADL Goals Pt Will Perform Upper Body Dressing: sitting;with caregiver independent in assisting Pt Will Perform Lower Body Dressing: sit to/from stand;with supervision;with set-up Pt Will Transfer to Toilet: with supervision;ambulating Pt Will Perform Toileting - Clothing Manipulation and hygiene: with modified independence Additional ADL Goal #1: Pt will independently instruct family/caregiver in sling mgt.  OT Frequency: Min 2X/week    Co-evaluation              AM-PAC OT "6 Clicks" Daily Activity     Outcome Measure Help from another person eating meals?: A Little Help from another person taking care of personal grooming?: A Little Help from another person toileting, which includes using toliet, bedpan, or urinal?: A Little Help from another person bathing (including washing, rinsing, drying)?: A Lot Help from another person to put on and taking off regular upper body clothing?: A Lot Help from another person to put on and taking off regular lower body clothing?: A Little 6 Click Score: 16   End of Session Equipment Utilized During Treatment: Gait belt;Oxygen Nurse Communication: Patient requests pain meds  Activity Tolerance: Patient tolerated treatment well Patient left: in bed;with call bell/phone within reach;with bed alarm set  OT Visit Diagnosis: Other abnormalities of gait and mobility (R26.89);History of falling (Z91.81);Pain;Muscle weakness (generalized) (M62.81) Pain - Right/Left: Left Pain - part of body: Shoulder                Time: 1610-9604 OT Time Calculation (min): 31 min Charges:  OT General Charges $OT Visit: 1 Visit OT Evaluation $OT Eval Moderate Complexity: 1 Mod OT Treatments $Self Care/Home Management : 8-22 mins  Arman Filter., MPH, MS, OTR/L ascom  [phone] 12/08/22, 11:04 AM

## 2022-12-08 NOTE — Hospital Course (Addendum)
86 y.o.f w/ Hypertension, hyperlipidemia, COPD on home oxygen and still smoking, CAD, memory loss,depression with anxiety, carotid artery stenosis who presents with accidental fall and left shoulder pain. In ED:WBC 11.9, positive urinalysis (clear appearance, moderate amount of leukocyte, positive nitrite, many bacteria, WBC 11-20), GFR> 60.  Temperature normal, blood pressure 150/64, heart rate 87, RR 16, oxygen saturation 100% on 3 L oxygen. Pt is placed on med-surg bed for obs. Dr. Joice Lofts of ortho was consulted.  Per ortho > given how well aligned the fracture is, this fracture can be managed nonsurgically in her shoulder immobilizer. The patient will be in this immobilizer for about 4 weeks before we can begin formal therapy. She is to avoid any weightbearing through the left hand or upper extremity before this time. Started on uti treatment and admitted

## 2022-12-08 NOTE — Evaluation (Signed)
Physical Therapy Evaluation Patient Details Name: Bethany [name] MRN: [id number] DOB: [birth year] Today's Date: 12/08/2022  History of Present Illness  87 y.o. female with medical history significant of COPD, dementia, anxiety, HTN, CAD, and HLD who presents to the [facility] ED (7/1) after being found down on floor by brother. Imaging revealed a minimally displaced proximal L humerus fracture. Per orthopedic surgery, pt to remain in shoulder immobilizer x4 wks, LUE NWBing.  Clinical Impression  Pt pleasant and motivated for therapy. She is mod I with all bed mobility tasks, not needing physical assistance or cueing come to sitting EOB at start of session. Pt required frequent VC's and demonstration of how to use [facility] properly when performing STS. She struggled to follow these commands but was able to perform STS with only min guard for safety; Increased time to get to fully upright standing. Pt able to ambulate ~180 feet with SPC and min guard before taking a short seated rest break. Pt able to perform 1 x 4 stairs with step-to pattern and min guard + SPC for safety; pt demonstrated no instances of instability of LOB throughout stair navigation. After stairs pt was able to ambulate ~200 feet back to room with no adverse responses. Her vitals remained WNL throughout the session, with O2 staying in a range of 95-99% on 3 L, and HR high 60's to mid 80's. Pt left in recliner on 3 L at end of session. Pt will benefit from continued PT services upon discharge to safely address deficits listed in patient problem list for decreased caregiver assistance and eventual return to PLOF.       Assistance Recommended at Discharge Intermittent Supervision/Assistance  If plan is discharge home, recommend the following:  Can travel by private vehicle  A little help with walking and/or transfers;A little help with bathing/dressing/bathroom;Assistance with cooking/housework;Assist for transportation;Direct supervision/assist for  medications management;Help with stairs or ramp for entrance        Equipment Recommendations None recommended by PT  Recommendations for Other Services       Functional Status Assessment Patient has had a recent decline in their functional status and demonstrates the ability to make significant improvements in function in a reasonable and predictable amount of time.     Precautions / Restrictions Precautions Precautions: Shoulder;Fall Shoulder Interventions: Shoulder sling/immobilizer;At all times Restrictions Weight Bearing Restrictions: Yes LUE Weight Bearing: Non weight bearing      Mobility  Bed Mobility Overal bed mobility: Needs Assistance Bed Mobility: Supine to Sit     Supine to sit: Modified independent (Device/Increase time)     General bed mobility comments: No physical assist or cueing required for sup-sit, increased time.    Transfers Overall transfer level: Needs assistance Equipment used: Straight cane Transfers: Sit to/from Stand Sit to Stand: Min guard           General transfer comment: Min guard for STS from bed/recliner. Frequent VC's and demonstration required for standing up using SPC and arm rest; pt had difficulty following commands on how to do this properly.    Ambulation/Gait Ambulation/Gait assistance: Min guard Gait Distance (Feet): 1 x 180 Feet, 1 x 200 feet Assistive device: Straight cane Gait Pattern/deviations: Step-through pattern, Decreased step length - left, Decreased step length - right Gait velocity: decreased     General Gait Details: Good, consistent cadence throughout. Self-selected reciprocal gait pattern. Min guard and SPC for safety.  Stairs Stairs: Yes Stairs assistance: Min guard Stair Management: No rails, Step to pattern, Forwards,  With cane Number of Stairs: 4 General stair comments: Self-selected step-to pattern for stair navigation. Min guard for safety but no instances of LOB or legs  buckling.  Wheelchair Mobility     Tilt Bed    Modified Rankin (Stroke Patients Only)       Balance Overall balance assessment: Needs assistance Sitting-balance support: No upper extremity supported, Feet supported Sitting balance-Leahy Scale: Good     Standing balance support: Single extremity supported, During functional activity Standing balance-Leahy Scale: Good Standing balance comment: Pt able to ambulate and stand with only single UE support, no LOB.                             Pertinent Vitals/Pain Pain Assessment Pain Assessment: No/denies pain    Home Living Family/patient expects to be discharged to:: Private residence Living Arrangements: Alone Available Help at Discharge: Family;Available PRN/intermittently (Brother and sister in law, brother checks on her a couple times a week) Type of Home: House Home Access: Stairs to enter Entrance Stairs-Rails: None Entrance Stairs-Number of Steps: 2 from front, 3 from back, neither with rails per pt (previous chart review notes bilateral)   Home Layout: One level Home Equipment: Shower seat;Grab bars - tub/shower;Cane - single Librarian, academic (2 wheels);BSC/3in1;Wheelchair - manual      Prior Function Prior Level of Function : Patient poor historian/Family not available;Independent/Modified Independent             Mobility Comments: Pt uses SPC in house, RW outside; Denies fall history other than the one resulting in current hospital stay, reports she got dizzy and fell; limited driving; described community ambulator. All per pt. ADLs Comments: Pt reports being ind. with all ADL's and IADL's. Pt uses 2-4 L O2 at home PRN.     Hand Dominance        Extremity/Trunk Assessment   Upper Extremity Assessment Upper Extremity Assessment: Defer to OT evaluation LUE Deficits / Details: denies sensory deficits LUE: Unable to fully assess due to immobilization;Shoulder pain at rest    Lower  Extremity Assessment Lower Extremity Assessment: Overall WFL for tasks assessed       Communication   Communication: No difficulties  Cognition Arousal/Alertness: Awake/alert Behavior During Therapy: WFL for tasks assessed/performed Overall Cognitive Status: History of cognitive impairments - at baseline                                 General Comments: Pt able to answer/confirm history/PLOF questions, family not present to verify information        General Comments General comments (skin integrity, edema, etc.): SpO2 96-97% on 3L, HR 82-91    Exercises     Assessment/Plan    PT Assessment Patient needs continued PT services  PT Problem List Decreased strength;Pain;Decreased cognition;Decreased knowledge of precautions;Decreased safety awareness;Decreased knowledge of use of DME       PT Treatment Interventions DME instruction;Therapeutic exercise;Gait training;Stair training;Patient/family education;Therapeutic activities;Functional mobility training    PT Goals (Current goals can be found in the Care Plan section)  Acute Rehab PT Goals Patient Stated Goal: Walk farther PT Goal Formulation: With patient Time For Goal Achievement: 12/21/22 Potential to Achieve Goals: Good    Frequency Min 3X/week     Co-evaluation               AM-PAC PT "6 Clicks" Mobility  Outcome Measure Help needed  turning from your back to your side while in a flat bed without using bedrails?: None Help needed moving from lying on your back to sitting on the side of a flat bed without using bedrails?: A Little Help needed moving to and from a bed to a chair (including a wheelchair)?: A Little Help needed standing up from a chair using your arms (e.g., wheelchair or bedside chair)?: None Help needed to walk in hospital room?: A Little Help needed climbing 3-5 steps with a railing? : A Little 6 Click Score: 20    End of Session Equipment Utilized During Treatment: Gait  belt;Oxygen Activity Tolerance: Patient tolerated treatment well Patient left: in chair;with call bell/phone within reach;with chair alarm set Nurse Communication: Mobility status;Precautions;Weight bearing status PT Visit Diagnosis: History of falling (Z91.81);Pain;Difficulty in walking, not elsewhere classified (R26.2) Pain - Right/Left: Left Pain - part of body: Arm;Shoulder    Time: 1610-9604 PT Time Calculation (min) (ACUTE ONLY): 36 min   Charges:                 Cena Benton, SPT 12/08/22, 2:01 PM

## 2022-12-08 NOTE — Progress Notes (Signed)
PROGRESS NOTE TARIYA BUEHL  WJX:[id number] DOB: 01/18/1936 DOA: 12/07/2022 PCP: Barbette Reichmann, MD  Brief Narrative/Hospital Course: 87 y.o.f w/ Hypertension, hyperlipidemia, COPD on home oxygen and still smoking, CAD, memory loss,depression with anxiety, carotid artery stenosis who presents with accidental fall and left shoulder pain. In ED:WBC 11.9, positive urinalysis (clear appearance, moderate amount of leukocyte, positive nitrite, many bacteria, WBC 11-20), GFR> 60.  Temperature normal, blood pressure 150/64, heart rate 87, RR 16, oxygen saturation 100% on 3 L oxygen. Pt is placed on med-surg bed for obs. Dr. Joice Lofts of ortho was consulted.  Per ortho > given how well aligned the fracture is, this fracture can be managed nonsurgically in her shoulder immobilizer. The patient will be in this immobilizer for about 4 weeks before we can begin formal therapy. She is to avoid any weightbearing through the left hand or upper extremity before this time. Started on uti treatment and admitted     Subjective: Patient seen examined alert awake communicative Seen and examined Resting C/o pain on left arm, in sling  Lives alone has brother nearby On 2l [place] home setting  Assessment and Plan: Principal Problem:   UTI (urinary tract infection) Active Problems:   Fall at home, initial encounter   Closed fracture of neck of left humerus   Chronic obstructive pulmonary disease (HCC)   HLD (hyperlipidemia)   HTN (hypertension)   Memory loss   Depression with anxiety   Protein-calorie malnutrition, severe (HCC)  OZH:[id number] urine culture continue ceftriaxone until then.   Fall at home, initial encounter Closed fracture of neck of left humerus:  Seen by orthopedic Dr. Joice Lofts advised 4 weeks of immobilizer nonweightbearing on the left hand or upper extremity pain control/muscle relaxant/lidocaine.  Continue PT OT and follow-up with orthopedics outpatient  Given her mild cognitive impairment at  risk, she lives alone and not able to use left hand will benefit with skilled nursing facility    COPD  Chronic respiratory failure with hypoxia  Ongoing tobacco use : Continue her bronchodilators, supplemental oxygen Trelegy Ellipta, Daliresp.  We discussed about smoking cessation  HLD:  Cont Lipitor  Hypertension BP fairly controlled continue metoprolol and as needed  meds   Memory loss Mild cognitive impairment Depression with anxiety: Continue her Namenda, Lexapro.  Severe malnutrition underweight BMI 17.3 dietitian consulted to augment nutritional status  DVT prophylaxis: enoxaparin (LOVENOX) injection 40 mg Start: 12/07/22 1600 Code Status:   Code Status: DNR Family Communication: plan of care discussed with patient at bedside. Patient status is:  admitted as observation but remains hospitalized for ongoing  because of UTI, left humerus fracture pain management Level of care: Med-Surg   Dispo: The patient is from: home alone            Anticipated disposition: SNF, unsafe to go home follow-up, unable to use left arm Objective: Vitals last 24 hrs: Vitals:   12/07/22 1654 12/07/22 1941 12/08/22 0448 12/08/22 0743  BP: (!) 144/64 (!) 109/55 (!) 161/73 (!) 133/55  Pulse: 79 81 84 83  Resp: 18 18 20 16   Temp: 98 F (36.7 C) 98 F (36.7 C) 98.2 F (36.8 C) 98.5 F (36.9 C)  TempSrc: Oral     SpO2: 100% 98% 99% 99%  Weight:      Height:       Weight change:   Physical Examination: General exam: alert awake, older than stated age HEENT:Oral mucosa moist, Ear/Nose WNL grossly Respiratory system: bilaterally clear BS, no use of accessory muscle  Cardiovascular system: S1 & S2 +, No JVD. Gastrointestinal system: Abdomen soft,NT,ND, BS+ Nervous System:Alert, awake, moving extremities-but left arm with sling in place. Extremities: LE edema neg,distal peripheral pulses palpable.  Skin: No rashes,no icterus. MSK: Normal muscle bulk,tone, power  Medications reviewed:   Scheduled Meds:  atorvastatin  40 mg Oral QPM   cholecalciferol  1,000 Units Oral Daily   enoxaparin (LOVENOX) injection  40 mg Subcutaneous Q24H   escitalopram  5 mg Oral Daily   feeding supplement  237 mL Oral BID BM   fluticasone furoate-vilanterol  1 puff Inhalation Daily   And   umeclidinium bromide  1 puff Inhalation Daily   lidocaine  1 patch Transdermal Q24H   memantine  10 mg Oral BID   metoprolol succinate  25 mg Oral Daily   multivitamin with minerals  1 tablet Oral Daily   roflumilast  250 mcg Oral Daily   tamsulosin  0.4 mg Oral QPC supper   Continuous Infusions:  cefTRIAXone (ROCEPHIN)  IV      Diet Order             Diet Heart Fluid consistency: Thin  Diet effective now                 No intake or output data in the 24 hours ending 12/08/22 1107 Net IO Since Admission: No IO data has been entered for this period [12/08/22 1107]  Wt Readings from Last 3 Encounters:  12/07/22 50.8 kg  07/17/21 43.5 kg  09/18/20 63.2 kg     Unresulted Labs (From admission, onward)     Start     Ordered   12/07/22 1510  Urine Culture  Add-on,   AD       Question:  Indication  Answer:  Dysuria   12/07/22 1509          Data Reviewed: I have personally reviewed following labs and imaging studies CBC: Recent Labs  Lab 12/07/22 1300 12/08/22 0436  WBC 11.9* 9.5  NEUTROABS 9.4*  --   HGB 11.8* 10.3*  HCT 37.6 31.8*  MCV 102.7* 100.0  PLT 189 196   Basic Metabolic Panel: Recent Labs  Lab 12/07/22 1300 12/08/22 0436  NA 137 140  K 4.1 3.8  CL 104 104  CO2 26 28  GLUCOSE 112* 100*  BUN 21 24*  CREATININE 0.70 0.68  CALCIUM 8.9 8.9   GFR: Estimated Creatinine Clearance: 42 mL/min (by C-G formula based on SCr of 0.68 mg/dL). Liver Function Tests: Recent Labs  Lab 12/07/22 1300  AST 19  ALT 11  ALKPHOS 49  BILITOT 1.2  PROT 6.1*  ALBUMIN 3.4*   Recent Labs  Lab 12/07/22 1732  INR 1.1  No results found for this or any previous visit (from  the past 240 hour(s)).  Antimicrobials: Anti-infectives (From admission, onward)    Start     Dose/Rate Route Frequency Ordered Stop   12/08/22 1500  cefTRIAXone (ROCEPHIN) 1 g in sodium chloride 0.9 % 100 mL IVPB        1 g 200 mL/hr over 30 Minutes Intravenous Every 24 hours 12/07/22 1523     12/07/22 1500  cefTRIAXone (ROCEPHIN) 2 g in sodium chloride 0.9 % 100 mL IVPB        2 g 200 mL/hr over 30 Minutes Intravenous  Once 12/07/22 1451 12/07/22 1618     Culture/Microbiology    Component Value Date/Time   SDES BLOOD BLOOD RIGHT FOREARM 09/16/2020 1455  SDES BLOOD RIGHT ANTECUBITAL 09/16/2020 1455   SPECREQUEST  09/16/2020 1455    BOTTLES DRAWN AEROBIC AND ANAEROBIC Blood Culture adequate volume   SPECREQUEST  09/16/2020 1455    BOTTLES DRAWN AEROBIC AND ANAEROBIC Blood Culture adequate volume   CULT  09/16/2020 1455    NO GROWTH 5 DAYS Performed at [facility], [address] [place] [place], [place] [address]    CULT  09/16/2020 1455    NO GROWTH 5 DAYS Performed at [facility], [address] [place], [place] [address]    REPTSTATUS 09/21/2020 FINAL 09/16/2020 1455   REPTSTATUS 09/21/2020 FINAL 09/16/2020 1455    Radiology Studies: DG Pelvis 1-2 Views  Result Date: 12/07/2022 CLINICAL DATA:  Pain after fall EXAM: PELVIS - 1 VIEW COMPARISON:  None Available. FINDINGS: Osteopenia. Chronic appearing deformity of the left iliac crest laterally with hypertrophic changes and deformity. Overall preserved hip joint spaces. Degenerative changes of the sacroiliac joints. No acute fracture or dislocation. Prominent vascular calcifications identified with vascular stents in the pelvis. Fixation hardware along the lumbar spine. Overall with this level of osteopenia a subtle nondisplaced injury is difficult to completely exclude and if needed additional workup as clinically directed with CT or MRI IMPRESSION: Severe osteopenia with chronic changes.  Degenerative changes.  Electronically Signed   By: Karen Kays M.D.   On: 12/07/2022 14:02   DG Chest 2 View  Result Date: 12/07/2022 CLINICAL DATA:  Pain after fall EXAM: CHEST - 2 VIEW COMPARISON:  None Available. FINDINGS: Hyperinflation. No consolidation, pneumothorax or effusion. Normal cardiopericardial silhouette without edema. Calcified tortuous aorta. Osteopenia. Old right-sided rib fractures. Degenerative changes of the spine. Lateral view is limited as the left arm was at the patient's side but there is a left proximal humeral fracture. Please see separate humerus x-rays IMPRESSION: Hyperinflation.  No acute cardiopulmonary disease. Electronically Signed   By: Karen Kays M.D.   On: 12/07/2022 14:00   DG Humerus Left  Result Date: 12/07/2022 CLINICAL DATA:  Pain after fall EXAM: LEFT HUMERUS - 2 VIEW COMPARISON:  None Available. FINDINGS: There is a comminuted fracture of the left humeral neck. Mild displacement. No additional fracture or dislocation. Osteopenia. Degenerative changes seen of the [facility] joint. There is some widening of the space between the humeral head in the acromion. Possible joint effusion. IMPRESSION: Osteopenia. Comminuted mildly displaced fracture of the humeral neck. Degenerative changes of the [facility] joint Electronically Signed   By: Karen Kays M.D.   On: 12/07/2022 14:00   CT HEAD WO CONTRAST ( )  Result Date: 12/07/2022 CLINICAL DATA:  Head trauma, moderate-severe; Neck trauma (Age >= 65y) EXAM: CT HEAD WITHOUT CONTRAST CT CERVICAL SPINE WITHOUT CONTRAST TECHNIQUE: Multidetector CT imaging of the head and cervical spine was performed following the standard protocol without intravenous contrast. Multiplanar CT image reconstructions of the cervical spine were also generated. RADIATION DOSE REDUCTION: This exam was performed according to the departmental dose-optimization program which includes automated exposure control, adjustment of the mA and/or kV according to patient size and/or use of  iterative reconstruction technique. COMPARISON:  CT Head 09/16/20 FINDINGS: CT HEAD FINDINGS Brain: No evidence of acute infarction, hemorrhage, hydrocephalus, extra-axial collection or mass lesion/mass effect. There is sequela of moderate chronic microvascular ischemic change. Vascular: No hyperdense vessel or unexpected calcification. Skull: Normal. Negative for fracture or focal lesion. Sinuses/Orbits: Trace right mastoid effusion. No middle ear effusion. Paranasal sinuses are clear. Bilateral lens replacement. Orbits are otherwise unremarkable. Other: None. CT CERVICAL SPINE FINDINGS  Alignment: Straightening of the normal cervical lordosis. Grade 1 anterolisthesis of C3 on C4. Trace retrolisthesis of C5 on C6. Skull base and vertebrae: No acute fracture. No primary bone lesion or focal pathologic process. Soft tissues and spinal canal: No prevertebral fluid or swelling. No visible canal hematoma. Severe atherosclerotic plaque of the carotid bifurcation bilaterally. Disc levels: Evidence of high-grade spinal canal stenosis. Mild multilevel degenerative changes throughout the cervical spine. Upper chest: Moderate centrilobular emphysema. Other: None IMPRESSION: 1. No acute intracranial abnormality. 2. No acute fracture or traumatic malalignment of the cervical spine. 3. Severe atherosclerotic plaque of the carotid bifurcation bilaterally. Consider further evaluation with carotid ultrasound. 4. Moderate centrilobular emphysema. Emphysema (ICD10-J43.9). Electronically Signed   By: Lorenza Cambridge M.D.   On: 12/07/2022 13:59   CT Cervical Spine Wo Contrast  Result Date: 12/07/2022 CLINICAL DATA:  Head trauma, moderate-severe; Neck trauma (Age >= 65y) EXAM: CT HEAD WITHOUT CONTRAST CT CERVICAL SPINE WITHOUT CONTRAST TECHNIQUE: Multidetector CT imaging of the head and cervical spine was performed following the standard protocol without intravenous contrast. Multiplanar CT image reconstructions of the cervical spine  were also generated. RADIATION DOSE REDUCTION: This exam was performed according to the departmental dose-optimization program which includes automated exposure control, adjustment of the mA and/or kV according to patient size and/or use of iterative reconstruction technique. COMPARISON:  CT Head 09/16/20 FINDINGS: CT HEAD FINDINGS Brain: No evidence of acute infarction, hemorrhage, hydrocephalus, extra-axial collection or mass lesion/mass effect. There is sequela of moderate chronic microvascular ischemic change. Vascular: No hyperdense vessel or unexpected calcification. Skull: Normal. Negative for fracture or focal lesion. Sinuses/Orbits: Trace right mastoid effusion. No middle ear effusion. Paranasal sinuses are clear. Bilateral lens replacement. Orbits are otherwise unremarkable. Other: None. CT CERVICAL SPINE FINDINGS Alignment: Straightening of the normal cervical lordosis. Grade 1 anterolisthesis of C3 on C4. Trace retrolisthesis of C5 on C6. Skull base and vertebrae: No acute fracture. No primary bone lesion or focal pathologic process. Soft tissues and spinal canal: No prevertebral fluid or swelling. No visible canal hematoma. Severe atherosclerotic plaque of the carotid bifurcation bilaterally. Disc levels: Evidence of high-grade spinal canal stenosis. Mild multilevel degenerative changes throughout the cervical spine. Upper chest: Moderate centrilobular emphysema. Other: None IMPRESSION: 1. No acute intracranial abnormality. 2. No acute fracture or traumatic malalignment of the cervical spine. 3. Severe atherosclerotic plaque of the carotid bifurcation bilaterally. Consider further evaluation with carotid ultrasound. 4. Moderate centrilobular emphysema. Emphysema (ICD10-J43.9). Electronically Signed   By: Lorenza Cambridge M.D.   On: 12/07/2022 13:59     LOS: 0 days   Lanae Boast, MD [facility]  12/08/2022, 11:07 AM

## 2022-12-08 NOTE — Care Management Obs Status (Signed)
MEDICARE OBSERVATION STATUS NOTIFICATION   Patient Details  Name: Bethany [name] MRN: [id number] Date of Birth: 1935/10/07   Medicare Observation Status Notification Given:  Yes    Shuan Statzer, LCSW 12/08/2022, 3:04 PM

## 2022-12-08 NOTE — TOC Initial Note (Signed)
Transition of Care [facility]) - Initial/Assessment Note    Patient Details  Name: Bethany [name] MRN: [id number] Date of Birth: [birth year]  Transition of Care [facility]) CM/SW Contact:    Allena Katz, LCSW Phone Number: 12/08/2022, 2:57 PM  Clinical Narrative:     CSW spoke with patient with brother present. Pt reports she lives alone and only has brother to assist. Pt reports she thinks she can return home. Brother does not think this is the case and would like to try SNF placement at [facility]. When asked about how patient broke her arm she reports she cannot remember. When asked about her being able to care for herself at home regarding bathing pt reports "its ok, I will just stink". Pt states she is independent at home and states she will try to figure it out. Brother states no one will come in and help her due to her smoking on her oxygen. Pt finally states she would agree to go to rehab but is unsure if she needs it or not. Pt agreeable to referrals being sent out in the [facility] area with a preference for [facility].               Expected Discharge Plan: Home/Self Care Barriers to Discharge: Continued Medical Work up   Patient Goals and CMS Choice Patient states their goals for this hospitalization and ongoing recovery are:: go to SNF and then return home CMS Medicare.gov Compare Post Acute Care list provided to:: Patient Choice offered to / list presented to : Patient      Expected Discharge Plan and Services                                              Prior Living Arrangements/Services   Lives with:: Self Patient language and need for interpreter reviewed:: Yes Do you feel safe going back to the place where you live?: Yes               Activities of Daily Living Home Assistive Devices/Equipment: Cane (specify quad or straight) ADL Screening (condition at time of admission) Patient's cognitive ability adequate to safely complete daily  activities?: No Is the patient deaf or have difficulty hearing?: No Does the patient have difficulty seeing, even when wearing glasses/contacts?: No Does the patient have difficulty concentrating, remembering, or making decisions?: No Patient able to express need for assistance with ADLs?: No Does the patient have difficulty dressing or bathing?: No Independently performs ADLs?: Yes (appropriate for developmental age) Does the patient have difficulty walking or climbing stairs?: No Weakness of Legs: None Weakness of Arms/Hands: Left  Permission Sought/Granted                  Emotional Assessment     Affect (typically observed): Pleasant Orientation: : Oriented to Self, Oriented to Place, Oriented to  Time, Oriented to Situation      Admission diagnosis:  UTI (urinary tract infection) [N39.0] Patient Active Problem List   Diagnosis Date Noted   UTI (urinary tract infection) 12/07/2022   HTN (hypertension) 12/07/2022   Depression with anxiety 12/07/2022   Protein-calorie malnutrition, severe (HCC) 12/07/2022   Fall at home, initial encounter 12/07/2022   Closed fracture of neck of left humerus 12/07/2022   Weight loss 11/13/2020   Memory loss 11/07/2020   Malnutrition of moderate degree 09/18/2020  CAP (community acquired pneumonia) 09/16/2020   Carotid stenosis 01/03/2017   PAD (peripheral artery disease) (HCC) 01/03/2017   Back pain, chronic 09/13/2015   Degenerative arthritis of lumbar spine 06/13/2015   Low serum cobalamin 03/21/2015   Anxiety 09/25/2014   Chronic obstructive pulmonary disease (HCC) 11/13/2013   BP (high blood pressure) 11/13/2013   HLD (hyperlipidemia) 11/13/2013   PCP:  Barbette Reichmann, MD Pharmacy:   [facility] [phone] - [place], [place] - [address] [place] [address] Phone: [phone] Fax: [phone]  [facility] - [place], [place] - [address] [place] [address] Phone: [phone] Fax:  [phone]  [facility] - [place], [place] - [address] [place] [address] Phone: [phone] Fax: [phone]     Social Determinants of Health (SDOH) Social History: SDOH Screenings   Food Insecurity: No Food Insecurity (12/07/2022)  Housing: Patient Declined (12/07/2022)  Transportation Needs: No Transportation Needs (12/07/2022)  Utilities: Not At Risk (12/07/2022)  Tobacco Use: Medium Risk (12/07/2022)   SDOH Interventions:     Readmission Risk Interventions     No data to display

## 2022-12-09 DIAGNOSIS — F0393 Unspecified dementia, unspecified severity, with mood disturbance: Secondary | ICD-10-CM | POA: Diagnosis present

## 2022-12-09 DIAGNOSIS — Z807 Family history of other malignant neoplasms of lymphoid, hematopoietic and related tissues: Secondary | ICD-10-CM | POA: Diagnosis not present

## 2022-12-09 DIAGNOSIS — F0394 Unspecified dementia, unspecified severity, with anxiety: Secondary | ICD-10-CM | POA: Diagnosis present

## 2022-12-09 DIAGNOSIS — N390 Urinary tract infection, site not specified: Secondary | ICD-10-CM | POA: Diagnosis present

## 2022-12-09 DIAGNOSIS — Z602 Problems related to living alone: Secondary | ICD-10-CM | POA: Diagnosis present

## 2022-12-09 DIAGNOSIS — S42212A Unspecified displaced fracture of surgical neck of left humerus, initial encounter for closed fracture: Secondary | ICD-10-CM | POA: Diagnosis present

## 2022-12-09 DIAGNOSIS — E440 Moderate protein-calorie malnutrition: Secondary | ICD-10-CM | POA: Diagnosis present

## 2022-12-09 DIAGNOSIS — J449 Chronic obstructive pulmonary disease, unspecified: Secondary | ICD-10-CM | POA: Diagnosis present

## 2022-12-09 DIAGNOSIS — I2510 Atherosclerotic heart disease of native coronary artery without angina pectoris: Secondary | ICD-10-CM | POA: Diagnosis present

## 2022-12-09 DIAGNOSIS — Z9981 Dependence on supplemental oxygen: Secondary | ICD-10-CM | POA: Diagnosis not present

## 2022-12-09 DIAGNOSIS — Y92010 Kitchen of single-family (private) house as the place of occurrence of the external cause: Secondary | ICD-10-CM | POA: Diagnosis not present

## 2022-12-09 DIAGNOSIS — Z66 Do not resuscitate: Secondary | ICD-10-CM | POA: Diagnosis present

## 2022-12-09 DIAGNOSIS — M1990 Unspecified osteoarthritis, unspecified site: Secondary | ICD-10-CM | POA: Diagnosis present

## 2022-12-09 DIAGNOSIS — Z8249 Family history of ischemic heart disease and other diseases of the circulatory system: Secondary | ICD-10-CM | POA: Diagnosis not present

## 2022-12-09 DIAGNOSIS — I10 Essential (primary) hypertension: Secondary | ICD-10-CM | POA: Diagnosis present

## 2022-12-09 DIAGNOSIS — E785 Hyperlipidemia, unspecified: Secondary | ICD-10-CM | POA: Diagnosis present

## 2022-12-09 DIAGNOSIS — W1830XA Fall on same level, unspecified, initial encounter: Secondary | ICD-10-CM | POA: Diagnosis not present

## 2022-12-09 DIAGNOSIS — R531 Weakness: Secondary | ICD-10-CM | POA: Diagnosis present

## 2022-12-09 DIAGNOSIS — Z681 Body mass index (BMI) 19 or less, adult: Secondary | ICD-10-CM | POA: Diagnosis not present

## 2022-12-09 DIAGNOSIS — Z885 Allergy status to narcotic agent status: Secondary | ICD-10-CM | POA: Diagnosis not present

## 2022-12-09 DIAGNOSIS — J9611 Chronic respiratory failure with hypoxia: Secondary | ICD-10-CM | POA: Diagnosis present

## 2022-12-09 DIAGNOSIS — B9620 Unspecified Escherichia coli [E. coli] as the cause of diseases classified elsewhere: Secondary | ICD-10-CM | POA: Diagnosis present

## 2022-12-09 DIAGNOSIS — Z880 Allergy status to penicillin: Secondary | ICD-10-CM | POA: Diagnosis not present

## 2022-12-09 DIAGNOSIS — Z803 Family history of malignant neoplasm of breast: Secondary | ICD-10-CM | POA: Diagnosis not present

## 2022-12-09 DIAGNOSIS — N3000 Acute cystitis without hematuria: Secondary | ICD-10-CM | POA: Diagnosis not present

## 2022-12-09 DIAGNOSIS — Z23 Encounter for immunization: Secondary | ICD-10-CM | POA: Diagnosis present

## 2022-12-09 DIAGNOSIS — S42302A Unspecified fracture of shaft of humerus, left arm, initial encounter for closed fracture: Secondary | ICD-10-CM | POA: Diagnosis present

## 2022-12-09 DIAGNOSIS — E559 Vitamin D deficiency, unspecified: Secondary | ICD-10-CM | POA: Diagnosis present

## 2022-12-09 DIAGNOSIS — Z882 Allergy status to sulfonamides status: Secondary | ICD-10-CM | POA: Diagnosis not present

## 2022-12-09 LAB — FOLATE: Folate: 15.2 ng/mL (ref >5.9)

## 2022-12-09 LAB — IRON AND TIBC
Iron: 86 ug/dL (ref 28–170)
Saturation Ratios: 27 % (ref 10.4–31.8)
TIBC: 318 ug/dL (ref 250–450)
UIBC: 232 ug/dL

## 2022-12-09 LAB — VITAMIN D 25 HYDROXY (VIT D DEFICIENCY, FRACTURES): Vit D, 25-Hydroxy: 24.88 ng/mL — ABNORMAL LOW (ref 30–100)

## 2022-12-09 LAB — URINE CULTURE: Culture: >=100000 — AB

## 2022-12-09 MED ORDER — CEPHALEXIN 500 MG PO CAPS
500.0000 mg | ORAL_CAPSULE | Freq: Two times a day (BID) | ORAL | Status: AC
  Administered 2022-12-09 – 2022-12-14 (×10): 500 mg via ORAL
  Filled 2022-12-09 (×10): qty 1

## 2022-12-09 NOTE — TOC Progression Note (Signed)
Transition of Care [facility]) - Progression Note    Patient Details  Name: Bethany [name] MRN: [id number] Date of Birth: [birth year]  Transition of Care [facility]) CM/SW Contact  Allena Katz, LCSW Phone Number: 12/09/2022, 12:08 PM  Clinical Narrative:    [facility] started for [facility] the pending cert is [id number]    Expected Discharge Plan: Home/Self Care Barriers to Discharge: Continued Medical Work up  Expected Discharge Plan and Services                                               Social Determinants of Health (SDOH) Interventions SDOH Screenings   Food Insecurity: No Food Insecurity (12/07/2022)  Housing: Patient Declined (12/07/2022)  Transportation Needs: No Transportation Needs (12/07/2022)  Utilities: Not At Risk (12/07/2022)  Tobacco Use: Medium Risk (12/07/2022)    Readmission Risk Interventions     No data to display

## 2022-12-09 NOTE — Progress Notes (Signed)
Occupational Therapy Treatment Patient Details Name: Bethany [name] MRN: [id number] DOB: 24-Jun-1935 Today's Date: 12/09/2022   History of present illness 87 y.o. female with medical history significant of COPD, dementia, anxiety, HTN, CAD, and HLD who presents to the [facility] ED (7/1) after being found down on floor by brother. Imaging revealed a minimally displaced proximal L humerus fracture. Per orthopedic surgery, pt to remain in shoulder immobilizer x4 wks, LUE NWBing.   OT comments  Chart reviewed, pt greeted in bed with sling off arm, incorrectly donned (pt reported she tried to do it herself). Pt is alert and oriented x4, fair awareness of current level of functioning/deficits/home safety. Tx session targeted progressing safe ADL completion with new LUE in sling, NWBing status. Pt continues to require supervision-CGA for functional mobility, toilet transfers, MIN A for LB dressing(underwear), MAX A for donning socks. Education provided re: sling/LUE positioning, NWBing precautions (which pt is able to demonstrate throughout), safe ADL completion. Appropriate carry over is noted on this date.  Pt is left in bedside chair with nurse present. All needs met. OT will continue to follow acutely.    Recommendations for follow up therapy are one component of a multi-disciplinary discharge planning process, led by the attending physician.  Recommendations may be updated based on patient status, additional functional criteria and insurance authorization.    Assistance Recommended at Discharge Frequent or constant Supervision/Assistance  Patient can return home with the following  A little help with walking and/or transfers;A lot of help with bathing/dressing/bathroom;Assistance with cooking/housework;Assist for transportation;Help with stairs or ramp for entrance;Direct supervision/assist for medications management   Equipment Recommendations  None recommended by OT    Recommendations for Other Services       Precautions / Restrictions Precautions Precautions: Shoulder;Fall Shoulder Interventions: Shoulder sling/immobilizer;At all times Restrictions Weight Bearing Restrictions: Yes LUE Weight Bearing: Non weight bearing       Mobility Bed Mobility Overal bed mobility: Needs Assistance Bed Mobility: Supine to Sit     Supine to sit: Modified independent (Device/Increase time)          Transfers Overall transfer level: Needs assistance Equipment used: None Transfers: Sit to/from Stand Sit to Stand: Min guard                 Balance Overall balance assessment: Needs assistance Sitting-balance support: No upper extremity supported, Feet supported Sitting balance-Leahy Scale: Good     Standing balance support: Single extremity supported, During functional activity Standing balance-Leahy Scale: Good                             ADL either performed or assessed with clinical judgement   ADL Overall ADL's : Needs assistance/impaired Eating/Feeding: Set up;Sitting   Grooming: Wash/dry hands;Standing;Supervision/safety;Min guard Grooming Details (indicate cue type and reason): sink level         Upper Body Dressing : Maximal assistance Upper Body Dressing Details (indicate cue type and reason): MAX A for sling, MAX A for gown using hemi dressing technique Lower Body Dressing: Minimal assistance;Sit to/from stand Lower Body Dressing Details (indicate cue type and reason): underwear Toilet Transfer: Min guard;Minimal Holiday representative;Ambulation Toilet Transfer Details (indicate cue type and reason): via HHA Toileting- Clothing Manipulation and Hygiene: Min guard;Sit to/from stand       Functional mobility during ADLs: Min guard;Minimal assistance;Cueing for safety (intermittent vcs for safety (o2 tubing) while amb in room)      Extremity/Trunk Assessment  Vision       Perception     Praxis      Cognition  Arousal/Alertness: Awake/alert Behavior During Therapy: WFL for tasks assessed/performed Overall Cognitive Status: No family/caregiver present to determine baseline cognitive functioning Area of Impairment: Attention, Memory, Following commands, Safety/judgement, Awareness                   Current Attention Level: Sustained Memory: Decreased short-term memory Following Commands: Follows one step commands with increased time Safety/Judgement: Decreased awareness of safety, Decreased awareness of deficits Awareness: Emergent            Exercises      Shoulder Instructions       General Comments spo2 88% after amb to bathroom on 4L via [place]; up to 95% on 4 L via [place] during seated rest break. RN in room and aware.    Pertinent Vitals/ Pain       Pain Assessment Pain Assessment: 0-10 Pain Score: 10-Worst pain ever Pain Location: L shoulder Pain Descriptors / Indicators: Discomfort, Grimacing Pain Intervention(s): Limited activity within patient's tolerance, Monitored during session, RN gave pain meds during session, Repositioned (sling adjusted)  Home Living                                          Prior Functioning/Environment              Frequency  Min 2X/week        Progress Toward Goals  OT Goals(current goals can now be found in the care plan section)  Progress towards OT goals: Progressing toward goals     Plan Discharge plan remains appropriate    Co-evaluation                 AM-PAC OT "6 Clicks" Daily Activity     Outcome Measure   Help from another person eating meals?: A Little Help from another person taking care of personal grooming?: A Little Help from another person toileting, which includes using toliet, bedpan, or urinal?: A Little Help from another person bathing (including washing, rinsing, drying)?: A Lot Help from another person to put on and taking off regular upper body clothing?: A Lot Help from  another person to put on and taking off regular lower body clothing?: A Little 6 Click Score: 16    End of Session Equipment Utilized During Treatment: Oxygen  OT Visit Diagnosis: Other abnormalities of gait and mobility (R26.89);History of falling (Z91.81);Pain;Muscle weakness (generalized) (M62.81) Pain - Right/Left: Left Pain - part of body: Shoulder   Activity Tolerance Patient tolerated treatment well   Patient Left with call bell/phone within reach;in chair   Nurse Communication Mobility status        Time: 1610-9604 OT Time Calculation (min): 12 min  Charges: OT General Charges $OT Visit: 1 Visit OT Treatments $Self Care/Home Management : 8-22 mins  Oleta Mouse, OTD OTR/L  12/09/22, 10:19 AM

## 2022-12-09 NOTE — Progress Notes (Signed)
[facility] Progress Note  Patient: Bethany [name]    ZOX:[id number]  DOA: 12/07/2022     Date of Service: the patient was seen and examined on 12/09/2022  Chief Complaint  Patient presents with   Fall   Brief hospital course: 87 y.o.f w/ Hypertension, hyperlipidemia, COPD on home oxygen and still smoking, CAD, memory loss,depression with anxiety, carotid artery stenosis who presents with accidental fall and left shoulder pain. In ED:WBC 11.9, positive urinalysis (clear appearance, moderate amount of leukocyte, positive nitrite, many bacteria, WBC 11-20), GFR> 60.  Temperature normal, blood pressure 150/64, heart rate 87, RR 16, oxygen saturation 100% on 3 L oxygen. Pt is placed on med-surg bed for obs. Dr. Joice Lofts of ortho was consulted.  Per ortho > given how well aligned the fracture is, this fracture can be managed nonsurgically in her shoulder immobilizer. The patient will be in this immobilizer for about 4 weeks before we can begin formal therapy. She is to avoid any weightbearing through the left hand or upper extremity before this time. Started on uti treatment and admitted   Assessment and Plan:  Principal Problem:   UTI (urinary tract infection) Active Problems:   Fall at home, initial encounter   Closed fracture of neck of left humerus   Chronic obstructive pulmonary disease (HCC)   HLD (hyperlipidemia)   HTN (hypertension)   Memory loss   Depression with anxiety   Protein-calorie malnutrition, severe (HCC)   UTI, Ucx Ecoli, pansensitive  S/p ceftriaxone, started Keflex   Fall at home, initial encounter Closed fracture of neck of left humerus:  Seen by orthopedic Dr. Joice Lofts advised 4 weeks of immobilizer nonweightbearing on the left hand or upper extremity pain control/muscle relaxant/lidocaine.  Continue PT OT and follow-up with orthopedics outpatient  Given her mild cognitive impairment at risk, she lives alone and not able to use left hand will benefit with skilled  nursing facility    COPD  Chronic respiratory failure with hypoxia  Ongoing tobacco use : Continue her bronchodilators, supplemental oxygen Trelegy Ellipta, Daliresp.  We discussed about smoking cessation   HLD:  Cont Lipitor   Hypertension BP fairly controlled continue metoprolol and as needed  meds   Memory loss Mild cognitive impairment Depression with anxiety: Continued Namenda and Lexapro.   Severe malnutrition underweight BMI 17.3 dietitian consulted to augment nutritional status   Body mass index is 17.53 kg/m.  Nutrition Problem: Moderate Malnutrition Etiology: chronic illness (COPD) Interventions: Interventions: Ensure Enlive (each supplement provides 350kcal and 20 grams of protein), MVI, Liberalize Diet   Diet: Regular diet DVT Prophylaxis: Subcutaneous Lovenox   Advance goals of care discussion: DNR  Family Communication: family was not present at bedside, at the time of interview.  The pt provided permission to discuss medical plan with the family. Opportunity was given to ask question and all questions were answered satisfactorily.   Disposition:  Pt is from Home, admitted with Fall, Rt Humeral Fracture, still has left arm sling and NW, dementia AAOx 1 unable to take of her self, which precludes a safe discharge. Discharge to SNF, when bed will be available.  Subjective: No overnight events, resting comfortably, denies any complaints.   Physical Exam: General: NAD, lying comfortably Appear in no distress, affect appropriate Eyes: PERRLA ENT: Oral Mucosa Clear, moist  Neck: no JVD,  Cardiovascular: S1 and S2 Present, no Murmur,  Respiratory: good respiratory effort, Bilateral Air entry equal and Decreased, no Crackles, no wheezes Abdomen: Bowel Sound present, Soft and no  tenderness,  Skin: no rashes Extremities: no Pedal edema, no calf tenderness. Left Arm sling intact Neurologic: without any new focal findings Gait not checked due to patient safety  concerns  Vitals:   12/09/22 1629 12/09/22 1714 12/09/22 1717 12/09/22 1720  BP: 122/70 (!) 112/59 (!) 106/59 (!) 92/53  Pulse: 71 72 74 85  Resp: 16   16  Temp: 98.1 F (36.7 C)     TempSrc:      SpO2: 100% 99% 99% 98%  Weight:      Height:        Intake/Output Summary (Last 24 hours) at 12/09/2022 1802 Last data filed at 12/09/2022 1340 Gross per 24 hour  Intake 0 ml  Output --  Net 0 ml   Filed Weights   12/07/22 1214  Weight: 50.8 kg    Data Reviewed: I have personally reviewed and interpreted daily labs, tele strips, imagings as discussed above. I reviewed all nursing notes, pharmacy notes, vitals, pertinent old records I have discussed plan of care as described above with RN and patient/family.  CBC: Recent Labs  Lab 12/07/22 1300 12/08/22 0436  WBC 11.9* 9.5  NEUTROABS 9.4*  --   HGB 11.8* 10.3*  HCT 37.6 31.8*  MCV 102.7* 100.0  PLT 189 196   Basic Metabolic Panel: Recent Labs  Lab 12/07/22 1300 12/08/22 0436  NA 137 140  K 4.1 3.8  CL 104 104  CO2 26 28  GLUCOSE 112* 100*  BUN 21 24*  CREATININE 0.70 0.68  CALCIUM 8.9 8.9    Studies: No results found.  Scheduled Meds:  atorvastatin  40 mg Oral QPM   cephALEXin  500 mg Oral Q12H   cholecalciferol  1,000 Units Oral Daily   enoxaparin (LOVENOX) injection  40 mg Subcutaneous Q24H   escitalopram  5 mg Oral Daily   feeding supplement  237 mL Oral BID BM   fluticasone furoate-vilanterol  1 puff Inhalation Daily   And   umeclidinium bromide  1 puff Inhalation Daily   lidocaine  1 patch Transdermal Q24H   memantine  10 mg Oral BID   metoprolol succinate  25 mg Oral Daily   multivitamin with minerals  1 tablet Oral Daily   roflumilast  250 mcg Oral Daily   tamsulosin  0.4 mg Oral QPC supper   Continuous Infusions: PRN Meds: acetaminophen, albuterol, dextromethorphan-guaiFENesin, fentaNYL (SUBLIMAZE) injection, hydrALAZINE, methocarbamol, ondansetron (ZOFRAN) IV, oxyCODONE-acetaminophen,  senna-docusate  Time spent: 35 minutes  Author: Gillis Santa. MD [facility] 12/09/2022 6:02 PM  To reach On-call, see care teams to locate the attending and reach out to them via [email or web address]. If 7PM-7AM, please contact night-coverage If you still have difficulty reaching the attending provider, please page the [facility] (Director on Call) for [facility] on amion for assistance.

## 2022-12-09 NOTE — Progress Notes (Signed)
Physical Therapy Treatment Patient Details Name: Bethany [name] MRN: [id number] DOB: 10-02-1935 Today's Date: 12/09/2022   History of Present Illness 87 y.o. female with medical history significant of COPD, dementia, anxiety, HTN, CAD, and HLD who presents to the [facility] ED (7/1) after being found down on floor by brother. Imaging revealed a minimally displaced proximal L humerus fracture. Per orthopedic surgery, pt to remain in shoulder immobilizer x4 wks, LUE NWBing.    PT Comments  Pt received in bed, L UE sling tangled in gown and partially removed. Pt assisted to Right EOB with MinA. Education provided on proper application to support UE with good understanding, continued education beneficial. Pt completed gait training in hall with SPC, slight unsteadiness yet no LOB. Pt however would not be able to self correct and maintain balance if suddenly startled off balance. Pt also demonstrated increased fatigue with slight SOB after 119ft of gait training on 3L requiring seated rest break x 2 minutes with SpO2 94%. Will continue to progress per POC.     Assistance Recommended at Discharge Intermittent Supervision/Assistance  If plan is discharge home, recommend the following:  Can travel by private vehicle    A little help with walking and/or transfers;A little help with bathing/dressing/bathroom;Assistance with cooking/housework;Assist for transportation;Direct supervision/assist for medications management;Help with stairs or ramp for entrance      Equipment Recommendations  None recommended by PT    Recommendations for Other Services       Precautions / Restrictions Precautions Precautions: Shoulder;Fall Type of Shoulder Precautions: NWB Shoulder Interventions: Shoulder sling/immobilizer;At all times Required Braces or Orthoses: Sling Restrictions Weight Bearing Restrictions: Yes LUE Weight Bearing: Non weight bearing     Mobility  Bed Mobility Overal bed mobility: Needs  Assistance Bed Mobility: Supine to Sit     Supine to sit: Min assist (to Right side) Sit to supine: Min guard   General bed mobility comments: No physical assist or cueing required for sit-sup, increased time.    Transfers Overall transfer level: Needs assistance Equipment used: None Transfers: Sit to/from Stand Sit to Stand: Min guard           General transfer comment:  (repeated cues to complete desired task)    Ambulation/Gait Ambulation/Gait assistance: Min guard Gait Distance (Feet): 160 Feet Assistive device: Straight cane Gait Pattern/deviations: Step-through pattern, Decreased step length - left, Decreased step length - right, Drifts right/left Gait velocity: decreased     General Gait Details: Good reciprocal pattern with use of cane, ocassional unsteadiness noted   Stairs             Wheelchair Mobility     Tilt Bed    Modified Rankin (Stroke Patients Only)       Balance Overall balance assessment: Needs assistance Sitting-balance support: No upper extremity supported, Feet supported Sitting balance-Leahy Scale: Good     Standing balance support: Single extremity supported, During functional activity, Reliant on assistive device for balance Standing balance-Leahy Scale: Fair Standing balance comment:  (Pt slightly unsteady ambulating in hall, no LOB, however remains a fall risk)                            Cognition Arousal/Alertness: Awake/alert Behavior During Therapy: WFL for tasks assessed/performed Overall Cognitive Status: No family/caregiver present to determine baseline cognitive functioning Area of Impairment: Attention, Memory, Following commands, Safety/judgement, Awareness  Current Attention Level: Sustained Memory: Decreased short-term memory Following Commands: Follows one step commands with increased time Safety/Judgement: Decreased awareness of safety, Decreased awareness of  deficits Awareness: Emergent   General Comments: Pt able to answer/confirm history/PLOF questions, family not present to verify information        Exercises General Exercises - Lower Extremity Ankle Circles/Pumps: AROM, Both, 10 reps, Seated Long Arc Quad: AROM, Both, 10 reps, Seated Hip Flexion/Marching: AROM, Both, 10 reps, Seated Other Exercises Other Exercises: Pt educated on proper donning of sling after receiving pt in bed with sling partially off and tangled in gown    General Comments General comments (skin integrity, edema, etc.): Pt ambulated on baseline 3L O2 with increased fatigue, SpO2 at 94%      Pertinent Vitals/Pain Pain Assessment Pain Assessment: 0-10 Pain Score: 3  Pain Location: L shoulder Pain Descriptors / Indicators: Discomfort, Grimacing Pain Intervention(s): Limited activity within patient's tolerance, Ice applied (Adjusted sling for optimal support)    Home Living                          Prior Function            PT Goals (current goals can now be found in the care plan section) Acute Rehab PT Goals Patient Stated Goal: Walk farther Progress towards PT goals: Progressing toward goals    Frequency    Min 3X/week      PT Plan      Co-evaluation              AM-PAC PT "6 Clicks" Mobility   Outcome Measure  Help needed turning from your back to your side while in a flat bed without using bedrails?: None Help needed moving from lying on your back to sitting on the side of a flat bed without using bedrails?: A Little Help needed moving to and from a bed to a chair (including a wheelchair)?: A Little Help needed standing up from a chair using your arms (e.g., wheelchair or bedside chair)?: None Help needed to walk in hospital room?: A Little Help needed climbing 3-5 steps with a railing? : A Little 6 Click Score: 20    End of Session Equipment Utilized During Treatment: Gait belt;Oxygen Activity Tolerance: Patient  tolerated treatment well Patient left: in bed;with bed alarm set;with call bell/phone within reach Nurse Communication: Mobility status;Precautions;Weight bearing status PT Visit Diagnosis: History of falling (Z91.81);Pain;Difficulty in walking, not elsewhere classified (R26.2) Pain - Right/Left: Left Pain - part of body: Arm;Shoulder     Time: 0865-7846 PT Time Calculation (min) (ACUTE ONLY): 26 min  Charges:    $Gait Training: 8-22 mins $Therapeutic Exercise: 8-22 mins PT General Charges $$ ACUTE PT VISIT: 1 Visit                    Zadie Cleverly, PTA  Jannet Askew 12/09/2022, 3:57 PM

## 2022-12-10 DIAGNOSIS — N3000 Acute cystitis without hematuria: Secondary | ICD-10-CM | POA: Diagnosis not present

## 2022-12-10 LAB — BASIC METABOLIC PANEL
Anion gap: 6 (ref 5–15)
BUN: 19 mg/dL (ref 8–23)
CO2: 28 mmol/L (ref 22–32)
Calcium: 8.7 mg/dL — ABNORMAL LOW (ref 8.9–10.3)
Chloride: 103 mmol/L (ref 98–111)
Creatinine, Ser: 0.78 mg/dL (ref 0.44–1.00)
GFR, Estimated: >60 mL/min (ref >60)
Glucose, Bld: 151 mg/dL — ABNORMAL HIGH (ref 70–99)
Potassium: 3.8 mmol/L (ref 3.5–5.1)
Sodium: 137 mmol/L (ref 135–145)

## 2022-12-10 LAB — CBC
HCT: 31.2 % — ABNORMAL LOW (ref 36.0–46.0)
Hemoglobin: 9.9 g/dL — ABNORMAL LOW (ref 12.0–15.0)
MCH: 32.4 pg (ref 26.0–34.0)
MCHC: 31.7 g/dL (ref 30.0–36.0)
MCV: 102 fL — ABNORMAL HIGH (ref 80.0–100.0)
Platelets: 195 10*3/uL (ref 150–400)
RBC: 3.06 MIL/uL — ABNORMAL LOW (ref 3.87–5.11)
RDW: 12.6 % (ref 11.5–15.5)
WBC: 6.5 10*3/uL (ref 4.0–10.5)
nRBC: 0 % (ref 0.0–0.2)

## 2022-12-10 LAB — MAGNESIUM: Magnesium: 2.2 mg/dL (ref 1.7–2.4)

## 2022-12-10 LAB — PHOSPHORUS: Phosphorus: 2.9 mg/dL (ref 2.5–4.6)

## 2022-12-10 MED ORDER — VITAMIN D (ERGOCALCIFEROL) 1.25 MG (50000 UNIT) PO CAPS
50000.0000 [IU] | ORAL_CAPSULE | ORAL | Status: DC
  Administered 2022-12-10: 50000 [IU] via ORAL
  Filled 2022-12-10: qty 1

## 2022-12-10 NOTE — Progress Notes (Signed)
Physical Therapy Treatment Patient Details Name: Bethany [name] MRN: [id number] DOB: 1935/12/29 Today's Date: 12/10/2022   History of Present Illness 87 y.o. female with medical history significant of COPD, dementia, anxiety, HTN, CAD, and HLD who presents to the [facility] ED (7/1) after being found down on floor by brother. Imaging revealed a minimally displaced proximal L humerus fracture. Per orthopedic surgery, pt to remain in shoulder immobilizer x4 wks, LUE NWBing.    PT Comments  Pt resting in bed upon PT arrival; pt reports having a good birthday and had visitors today.  Initially pt declining PT d/t low back pain but then agreeable to mobility; pt reporting no pain end of session.  During session pt SBA with transfers and CGA to ambulate 220 feet with SPC use.  Pt occasionally with altered stepping pattern but no overt loss of balance noted during ambulation.  No loss of balance noted during static and dynamic standing activities.  Will continue to focus on higher level balance, functional mobility, and overall strengthening activities during hospitalization.     Assistance Recommended at Discharge Intermittent Supervision/Assistance  If plan is discharge home, recommend the following:  Can travel by private vehicle    A little help with walking and/or transfers;A little help with bathing/dressing/bathroom;Assistance with cooking/housework;Assist for transportation;Direct supervision/assist for medications management;Help with stairs or ramp for entrance      Equipment Recommendations  Cane    Recommendations for Other Services       Precautions / Restrictions Precautions Precautions: Shoulder;Fall Type of Shoulder Precautions: NWB Shoulder Interventions: Shoulder sling/immobilizer;At all times Required Braces or Orthoses: Sling Restrictions Weight Bearing Restrictions: Yes LUE Weight Bearing: Non weight bearing     Mobility  Bed Mobility Overal bed mobility: Needs  Assistance Bed Mobility: Supine to Sit     Supine to sit: Min assist, HOB elevated (to L side of bed; assist for trunk per pt request) Sit to supine: Supervision, HOB elevated        Transfers Overall transfer level: Needs assistance Equipment used: None Transfers: Sit to/from Stand Sit to Stand: Supervision           General transfer comment: x8 trials sit to stand from bed; steady; vc's for R hand placement to push off of bed to stand and reach back for bed prior to sitting    Ambulation/Gait Ambulation/Gait assistance: Min guard Gait Distance (Feet): 220 Feet Assistive device: Straight cane Gait Pattern/deviations: Step-through pattern, Decreased step length - left, Decreased step length - right, Drifts right/left Gait velocity: decreased     General Gait Details: occasional altered stepping pattern but no overt loss of balance noted   Stairs             Wheelchair Mobility     Tilt Bed    Modified Rankin (Stroke Patients Only)       Balance Overall balance assessment: Needs assistance Sitting-balance support: No upper extremity supported, Feet supported Sitting balance-Leahy Scale: Good Sitting balance - Comments: steady reaching within BOS   Standing balance support: Single extremity supported, During functional activity, Reliant on assistive device for balance Standing balance-Leahy Scale: Good Standing balance comment: occasional altered stepping pattern but no overt loss of balance noted during ambulation; steady reaching within and just outside BOS with R UE               High Level Balance Comments: x10 seconds standing with feet shoulder width apart (pt steady) and x10 seconds standing with feet together (mild sway  noted but no loss of balance)            Cognition Arousal/Alertness: Awake/alert Behavior During Therapy: WFL for tasks assessed/performed Overall Cognitive Status: No family/caregiver present to determine baseline  cognitive functioning Area of Impairment: Attention, Memory, Following commands, Safety/judgement, Awareness                   Current Attention Level: Sustained Memory: Decreased short-term memory Following Commands: Follows one step commands with increased time Safety/Judgement: Decreased awareness of safety, Decreased awareness of deficits Awareness: Emergent            Exercises      General Comments        Pertinent Vitals/Pain Pain Assessment Pain Assessment: No/denies pain Pain Intervention(s): Limited activity within patient's tolerance, Monitored during session, Repositioned Vitals (HR and SpO2 on 4 L supplemental O2) stable and WFL throughout treatment session.    Home Living                          Prior Function            PT Goals (current goals can now be found in the care plan section) Acute Rehab PT Goals Patient Stated Goal: Walk more PT Goal Formulation: With patient Time For Goal Achievement: 12/21/22 Potential to Achieve Goals: Good Progress towards PT goals: Progressing toward goals    Frequency    Min 3X/week      PT Plan Current plan remains appropriate    Co-evaluation              AM-PAC PT "6 Clicks" Mobility   Outcome Measure  Help needed turning from your back to your side while in a flat bed without using bedrails?: None Help needed moving from lying on your back to sitting on the side of a flat bed without using bedrails?: A Little Help needed moving to and from a bed to a chair (including a wheelchair)?: A Little Help needed standing up from a chair using your arms (e.g., wheelchair or bedside chair)?: A Little Help needed to walk in hospital room?: A Little Help needed climbing 3-5 steps with a railing? : A Little 6 Click Score: 19    End of Session Equipment Utilized During Treatment: Gait belt;Oxygen (4 L via nasal cannula) Activity Tolerance: Patient tolerated treatment well Patient left: in  bed;with call bell/phone within reach;with bed alarm set Nurse Communication: Mobility status;Precautions PT Visit Diagnosis: History of falling (Z91.81);Pain;Difficulty in walking, not elsewhere classified (R26.2) Pain - Right/Left: Left Pain - part of body: Arm;Shoulder     Time: 4098-1191 PT Time Calculation (min) (ACUTE ONLY): 23 min  Charges:    $Gait Training: 8-22 mins $Therapeutic Activity: 8-22 mins PT General Charges $$ ACUTE PT VISIT: 1 Visit                     Hendricks Limes, PT 12/10/22, 4:33 PM

## 2022-12-10 NOTE — Progress Notes (Signed)
[facility] Progress Note  Patient: Bethany [name]    BJY:[id number]  DOA: 12/07/2022     Date of Service: the patient was seen and examined on 12/10/2022  Chief Complaint  Patient presents with   Fall   Brief hospital course: 87 y.o.f w/ Hypertension, hyperlipidemia, COPD on home oxygen and still smoking, CAD, memory loss,depression with anxiety, carotid artery stenosis who presents with accidental fall and left shoulder pain. In ED:WBC 11.9, positive urinalysis (clear appearance, moderate amount of leukocyte, positive nitrite, many bacteria, WBC 11-20), GFR> 60.  Temperature normal, blood pressure 150/64, heart rate 87, RR 16, oxygen saturation 100% on 3 L oxygen. Pt is placed on med-surg bed for obs. Dr. Joice Lofts of ortho was consulted.  Per ortho > given how well aligned the fracture is, this fracture can be managed nonsurgically in her shoulder immobilizer. The patient will be in this immobilizer for about 4 weeks before we can begin formal therapy. She is to avoid any weightbearing through the left hand or upper extremity before this time. Started on uti treatment and admitted   Assessment and Plan:  Principal Problem:   UTI (urinary tract infection) Active Problems:   Fall at home, initial encounter   Closed fracture of neck of left humerus   Chronic obstructive pulmonary disease (HCC)   HLD (hyperlipidemia)   HTN (hypertension)   Memory loss   Depression with anxiety   Protein-calorie malnutrition, severe (HCC)   UTI, Ucx Ecoli, pansensitive  S/p ceftriaxone, started Keflex x 5 days   Fall at home, initial encounter Closed fracture of neck of left humerus:  Seen by orthopedic Dr. Joice Lofts advised 4 weeks of immobilizer nonweightbearing on the left hand or upper extremity pain control/muscle relaxant/lidocaine.  Continue PT OT and follow-up with orthopedics outpatient  Given her mild cognitive impairment at risk, she lives alone and not able to use left hand will benefit with  skilled nursing facility    COPD  Chronic respiratory failure with hypoxia  Ongoing tobacco use : Continue her bronchodilators, supplemental oxygen Trelegy Ellipta, Daliresp.  We discussed about smoking cessation   HLD:  Cont Lipitor   Hypertension BP fairly controlled continue metoprolol and as needed  meds   Memory loss Mild cognitive impairment Depression with anxiety: Continued Namenda and Lexapro.  Vitamin D Insufficiency: started vitamin D 50,000 units p.o. weekly, follow with PCP to repeat vitamin D level after 3 to 6 months.   Severe malnutrition underweight BMI 17.3 dietitian consulted to augment nutritional status   Body mass index is 17.53 kg/m.  Nutrition Problem: Moderate Malnutrition Etiology: chronic illness (COPD) Interventions: Interventions: Ensure Enlive (each supplement provides 350kcal and 20 grams of protein), MVI, Liberalize Diet   Diet: Regular diet DVT Prophylaxis: Subcutaneous Lovenox   Advance goals of care discussion: DNR  Family Communication: family was present at bedside, at the time of interview.  The pt provided permission to discuss medical plan with the family. Opportunity was given to ask question and all questions were answered satisfactorily.  7/4 d/w patient's brother at bedside.  Disposition:  Pt is from Home, admitted with Fall, Rt Humeral Fracture, still has left arm sling and NW, dementia AAOx 1 unable to take of her self, which precludes a safe discharge. Discharge to SNF, when bed will be available.  Subjective: No overnight events, resting comfortably, denies any complaints.   Physical Exam: General: NAD, lying comfortably Appear in no distress, affect appropriate Eyes: PERRLA ENT: Oral Mucosa Clear, moist  Neck:  no JVD,  Cardiovascular: S1 and S2 Present, no Murmur,  Respiratory: good respiratory effort, Bilateral Air entry equal and Decreased, no Crackles, no wheezes Abdomen: Bowel Sound present, Soft and no  tenderness,  Skin: no rashes Extremities: no Pedal edema, no calf tenderness. Left Arm sling intact Neurologic: without any new focal findings Gait not checked due to patient safety concerns  Vitals:   12/09/22 1720 12/09/22 1943 12/10/22 0549 12/10/22 0716  BP: (!) 92/53 117/71 139/71 (!) 112/93  Pulse: 85 73 76 73  Resp: 16 18  16   Temp:  98.3 F (36.8 C) 97.8 F (36.6 C) 98.2 F (36.8 C)  TempSrc:  Oral Oral Oral  SpO2: 98% 100% 100% 100%  Weight:      Height:        Intake/Output Summary (Last 24 hours) at 12/10/2022 1419 Last data filed at 12/09/2022 1845 Gross per 24 hour  Intake 120 ml  Output --  Net 120 ml   Filed Weights   12/07/22 1214  Weight: 50.8 kg    Data Reviewed: I have personally reviewed and interpreted daily labs, tele strips, imagings as discussed above. I reviewed all nursing notes, pharmacy notes, vitals, pertinent old records I have discussed plan of care as described above with RN and patient/family.  CBC: Recent Labs  Lab 12/07/22 1300 12/08/22 0436 12/10/22 0603  WBC 11.9* 9.5 6.5  NEUTROABS 9.4*  --   --   HGB 11.8* 10.3* 9.9*  HCT 37.6 31.8* 31.2*  MCV 102.7* 100.0 102.0*  PLT 189 196 195   Basic Metabolic Panel: Recent Labs  Lab 12/07/22 1300 12/08/22 0436 12/10/22 0603  NA 137 140 137  K 4.1 3.8 3.8  CL 104 104 103  CO2 26 28 28   GLUCOSE 112* 100* 151*  BUN 21 24* 19  CREATININE 0.70 0.68 0.78  CALCIUM 8.9 8.9 8.7*  MG  --   --  2.2  PHOS  --   --  2.9    Studies: No results found.  Scheduled Meds:  atorvastatin  40 mg Oral QPM   cephALEXin  500 mg Oral Q12H   cholecalciferol  1,000 Units Oral Daily   enoxaparin (LOVENOX) injection  40 mg Subcutaneous Q24H   escitalopram  5 mg Oral Daily   feeding supplement  237 mL Oral BID BM   fluticasone furoate-vilanterol  1 puff Inhalation Daily   And   umeclidinium bromide  1 puff Inhalation Daily   lidocaine  1 patch Transdermal Q24H   memantine  10 mg Oral BID    metoprolol succinate  25 mg Oral Daily   multivitamin with minerals  1 tablet Oral Daily   roflumilast  250 mcg Oral Daily   tamsulosin  0.4 mg Oral QPC supper   Vitamin D (Ergocalciferol)  50,000 Units Oral Q7 days   Continuous Infusions: PRN Meds: acetaminophen, albuterol, dextromethorphan-guaiFENesin, fentaNYL (SUBLIMAZE) injection, hydrALAZINE, methocarbamol, ondansetron (ZOFRAN) IV, oxyCODONE-acetaminophen, senna-docusate  Time spent: 35 minutes  Author: Gillis Santa. MD [facility] 12/10/2022 2:19 PM  To reach On-call, see care teams to locate the attending and reach out to them via [email or web address]. If 7PM-7AM, please contact night-coverage If you still have difficulty reaching the attending provider, please page the [facility] (Director on Call) for [facility] on amion for assistance.

## 2022-12-10 NOTE — Progress Notes (Signed)
Patient removed her I/v during night shift. Md made aware and is ok to not start new I/v on her.

## 2022-12-10 NOTE — Plan of Care (Signed)

## 2022-12-11 DIAGNOSIS — N3000 Acute cystitis without hematuria: Secondary | ICD-10-CM | POA: Diagnosis not present

## 2022-12-11 LAB — TYPE AND SCREEN
ABO/RH(D): A POS
Antibody Screen: POSITIVE
Unit division: 0
Unit division: 0

## 2022-12-11 LAB — BPAM RBC
Blood Product Expiration Date: 202407162359
Blood Product Expiration Date: 202407162359
Unit Type and Rh: 6200
Unit Type and Rh: 6200

## 2022-12-11 MED ORDER — BISACODYL 5 MG PO TBEC: 10.0000 mg | DELAYED_RELEASE_TABLET | Freq: Every day | ORAL | Status: DC | PRN

## 2022-12-11 MED ORDER — BISACODYL 10 MG RE SUPP: 10.0000 mg | Freq: Every day | RECTAL | Status: DC | PRN

## 2022-12-11 MED ORDER — LACTATED RINGERS IV BOLUS: 250.0000 mL | Freq: Once | INTRAVENOUS | Status: DC

## 2022-12-11 MED ORDER — POLYETHYLENE GLYCOL 3350 17 G PO PACK
17.0000 g | PACK | Freq: Every day | ORAL | Status: DC
  Administered 2022-12-11 – 2022-12-14 (×4): 17 g via ORAL
  Filled 2022-12-11 (×4): qty 1

## 2022-12-11 NOTE — Progress Notes (Signed)
       CROSS COVER NOTE  NAME: Bethany [name] MRN: [id number] DOB : 1935/11/03    Concern as stated by nurse / staff   Orthostatic vitals order every shif    12/11/22 0045  Orthostatic Lying   BP- Lying 121/71  Pulse- Lying 72  Orthostatic Sitting  BP- Sitting 99/61  Pulse- Sitting 79  Orthostatic Standing at 0 minutes  BP- Standing at 0 minutes (!) 78/57  Pulse- Standing at 0 minutes 88  Orthostatic Standing at 3 minutes  BP- Standing at 3 minutes (!) 84/63  Pulse- Standing at 3 minutes 92      Pertinent findings on chart review: Patient admitted after fall found to have closed fracture left humerus and UTI.   Assessment and  Interventions   Assessment: + orthostasis Plan: Initial fluid bolus ordered but cancelled secondary to possible normal finding for 87 year old in middle of night Orthostatic vitals discontinued as i believe this set was last to record       Donnie Mesa NP [facility] Cross Cover 7pm-7am - check amion for availability Pager [phone]

## 2022-12-11 NOTE — Plan of Care (Signed)

## 2022-12-11 NOTE — Progress Notes (Signed)
Physical Therapy Treatment Patient Details Name: Bethany [name] MRN: [id number] DOB: 12/31/1935 Today's Date: 12/11/2022   History of Present Illness 87 y.o. female with medical history significant of COPD, dementia, anxiety, HTN, CAD, and HLD who presents to the [facility] ED (7/1) after being found down on floor by brother. Imaging revealed a minimally displaced proximal L humerus fracture. Per orthopedic surgery, pt to remain in shoulder immobilizer x4 wks, LUE NWBing.    PT Comments  Pt sitting in regular chair upon PT arrival with her regular clothes donned (O2 tubing tangled in her clothes and around L leg)--pt reports getting dressed herself (L shoulder sling sitting in pt's lap so therapist assisted pt with donning sling)--nurse notified of these concerns and cognition concerns.  Pt reported she was at a motel and did not know why her L shoulder hurt; pt did know date and self.  During session pt SBA with transfers; CGA to SBA ambulating 160 feet x2 with SPC use; and CGA to navigate 4 steps with SPC use.  Pt requiring sitting rest break after stairs d/t SOB (SpO2 sats 94% or greater on supplemental O2 during sessions activities).  Will continue to focus on strengthening, higher level balance, and progressive functional mobility during hospitalization.    Assistance Recommended at Discharge Intermittent Supervision/Assistance  If plan is discharge home, recommend the following:  Can travel by private vehicle    A little help with walking and/or transfers;A little help with bathing/dressing/bathroom;Assistance with cooking/housework;Assist for transportation;Direct supervision/assist for medications management;Help with stairs or ramp for entrance      Equipment Recommendations  Cane    Recommendations for Other Services       Precautions / Restrictions Precautions Precautions: Shoulder;Fall Type of Shoulder Precautions: NWB Shoulder Interventions: Shoulder sling/immobilizer;At all  times Required Braces or Orthoses: Sling Restrictions Weight Bearing Restrictions: Yes LUE Weight Bearing: Non weight bearing     Mobility  Bed Mobility Overal bed mobility: Modified Independent Bed Mobility: Sit to Supine       Sit to supine: Modified independent (Device/Increase time), HOB elevated        Transfers Overall transfer level: Needs assistance Equipment used: None Transfers: Sit to/from Stand Sit to Stand: Supervision           General transfer comment: SBA for safety    Ambulation/Gait Ambulation/Gait assistance: Min guard, Supervision Gait Distance (Feet):  (160 feet x2) Assistive device: Straight cane Gait Pattern/deviations: Step-through pattern, Decreased step length - left, Decreased step length - right Gait velocity: decreased     General Gait Details: very occasional altered stepping pattern but no overt loss of balance noted   Stairs Stairs: Yes Stairs assistance: Min guard Stair Management: Step to pattern, Forwards, With cane Number of Stairs: 4 General stair comments: CGA for safety   Wheelchair Mobility     Tilt Bed    Modified Rankin (Stroke Patients Only)       Balance Overall balance assessment: Needs assistance Sitting-balance support: No upper extremity supported, Feet supported Sitting balance-Leahy Scale: Good Sitting balance - Comments: steady reaching within BOS   Standing balance support: Single extremity supported, During functional activity, Reliant on assistive device for balance Standing balance-Leahy Scale: Good Standing balance comment: very occasional altered stepping pattern but no overt loss of balance noted during ambulation                            Cognition Arousal/Alertness: Awake/alert Behavior During Therapy: [facility]  for tasks assessed/performed Overall Cognitive Status: No family/caregiver present to determine baseline cognitive functioning Area of Impairment: Attention, Memory,  Following commands, Safety/judgement, Awareness, Orientation                 Orientation Level: Disoriented to, Place, Situation Current Attention Level: Sustained Memory: Decreased short-term memory, Decreased recall of precautions Following Commands: Follows one step commands with increased time Safety/Judgement: Decreased awareness of safety, Decreased awareness of deficits Awareness: Emergent            Exercises      General Comments  Pt agreeable to PT session.      Pertinent Vitals/Pain Pain Assessment Pain Assessment: Faces Faces Pain Scale: Hurts a little bit Pain Location: L shoulder Pain Descriptors / Indicators: Sore, Discomfort, Guarding Pain Intervention(s): Limited activity within patient's tolerance, Monitored during session, Repositioned HR WFL during sessions activities.    Home Living                          Prior Function            PT Goals (current goals can now be found in the care plan section) Acute Rehab PT Goals Patient Stated Goal: Walk more PT Goal Formulation: With patient Time For Goal Achievement: 12/21/22 Potential to Achieve Goals: Good Progress towards PT goals: Progressing toward goals    Frequency    Min 3X/week      PT Plan Current plan remains appropriate    Co-evaluation              AM-PAC PT "6 Clicks" Mobility   Outcome Measure  Help needed turning from your back to your side while in a flat bed without using bedrails?: None Help needed moving from lying on your back to sitting on the side of a flat bed without using bedrails?: A Little Help needed moving to and from a bed to a chair (including a wheelchair)?: A Little Help needed standing up from a chair using your arms (e.g., wheelchair or bedside chair)?: A Little Help needed to walk in hospital room?: A Little Help needed climbing 3-5 steps with a railing? : A Little 6 Click Score: 19    End of Session Equipment Utilized During  Treatment: Gait belt;Oxygen (4 L via nasal cannula) Activity Tolerance: Patient limited by fatigue Patient left: in bed;with call bell/phone within reach;with bed alarm set Nurse Communication: Mobility status;Precautions PT Visit Diagnosis: History of falling (Z91.81);Pain;Difficulty in walking, not elsewhere classified (R26.2) Pain - Right/Left: Left Pain - part of body: Arm;Shoulder     Time: 1610-9604 PT Time Calculation (min) (ACUTE ONLY): 23 min  Charges:    $Gait Training: 8-22 mins $Therapeutic Activity: 8-22 mins PT General Charges $$ ACUTE PT VISIT: 1 Visit                     Hendricks Limes, PT 12/11/22, 5:03 PM

## 2022-12-11 NOTE — Care Management Important Message (Signed)
Important Message  Patient Details  Name: Bethany [name] MRN: [id number] Date of Birth: 1935/11/27   Medicare Important Message Given:  Yes  I reviewed the Important Message from Medicare with the patient and her POA, Brother [employer].  Stated they understood these rights and POA signed the form. I will send to Health Information Management to be scanned into the patient's medical record. I thanked them both for their time and wished [name] a Happy Birthday as she just celebrated her 87th birthday on July 4th.   Olegario Messier A Jontue Crumpacker 12/11/2022, 2:16 PM

## 2022-12-11 NOTE — TOC Progression Note (Signed)
Transition of Care [facility]) - Progression Note    Patient Details  Name: Bethany [name] MRN: [id number] Date of Birth: 01-Apr-1936  [facility]) CM/SW Contact  Allena Katz, LCSW Phone Number: 12/11/2022, 1:52 PM  Clinical Narrative:   [facility] is still pending for LC.     Expected Discharge Plan: Home/Self Care Barriers to Discharge: Continued Medical Work up  Expected Discharge Plan and Services                                               Social Determinants of Health (SDOH) Interventions SDOH Screenings   Food Insecurity: No Food Insecurity (12/07/2022)  Housing: Patient Declined (12/07/2022)  Transportation Needs: No Transportation Needs (12/07/2022)  Utilities: Not At Risk (12/07/2022)  Tobacco Use: Medium Risk (12/07/2022)    Readmission Risk Interventions     No data to display

## 2022-12-11 NOTE — Progress Notes (Signed)
[facility] Progress Note  Patient: Bethany [name]    WGN:[id number]  DOA: 12/07/2022     Date of Service: the patient was seen and examined on 12/11/2022  Chief Complaint  Patient presents with   Fall   Brief hospital course: 87 y.o.f w/ Hypertension, hyperlipidemia, COPD on home oxygen and still smoking, CAD, memory loss,depression with anxiety, carotid artery stenosis who presents with accidental fall and left shoulder pain. In ED:WBC 11.9, positive urinalysis (clear appearance, moderate amount of leukocyte, positive nitrite, many bacteria, WBC 11-20), GFR> 60.  Temperature normal, blood pressure 150/64, heart rate 87, RR 16, oxygen saturation 100% on 3 L oxygen. Pt is placed on med-surg bed for obs. Dr. Joice Lofts of ortho was consulted.  Per ortho > given how well aligned the fracture is, this fracture can be managed nonsurgically in her shoulder immobilizer. The patient will be in this immobilizer for about 4 weeks before we can begin formal therapy. She is to avoid any weightbearing through the left hand or upper extremity before this time. Started on uti treatment and admitted   Assessment and Plan:  Principal Problem:   UTI (urinary tract infection) Active Problems:   Fall at home, initial encounter   Closed fracture of neck of left humerus   Chronic obstructive pulmonary disease (HCC)   HLD (hyperlipidemia)   HTN (hypertension)   Memory loss   Depression with anxiety   Protein-calorie malnutrition, severe (HCC)   UTI, Ucx Ecoli, pansensitive  S/p ceftriaxone, started Keflex x 5 days   Fall at home, initial encounter Closed fracture of neck of left humerus:  Seen by orthopedic Dr. Joice Lofts advised 4 weeks of immobilizer nonweightbearing on the left hand or upper extremity pain control/muscle relaxant/lidocaine.  Continue PT OT and follow-up with orthopedics outpatient  Given her mild cognitive impairment at risk, she lives alone and not able to use left hand will benefit with  skilled nursing facility    COPD  Chronic respiratory failure with hypoxia  Ongoing tobacco use : Continue her bronchodilators, supplemental oxygen Trelegy Ellipta, Daliresp.  We discussed about smoking cessation   HLD:  Cont Lipitor   Hypertension BP fairly controlled continue metoprolol and as needed  meds   Memory loss Mild cognitive impairment Depression with anxiety: Continued Namenda and Lexapro.  Vitamin D Insufficiency: started vitamin D 50,000 units p.o. weekly, follow with PCP to repeat vitamin D level after 3 to 6 months.   Severe malnutrition underweight BMI 17.3 dietitian consulted to augment nutritional status   Body mass index is 17.53 kg/m.  Nutrition Problem: Moderate Malnutrition Etiology: chronic illness (COPD) Interventions: Interventions: Ensure Enlive (each supplement provides 350kcal and 20 grams of protein), MVI, Liberalize Diet   Diet: Regular diet DVT Prophylaxis: Subcutaneous Lovenox   Advance goals of care discussion: DNR  Family Communication: family was present at bedside, at the time of interview.  The pt provided permission to discuss medical plan with the family. Opportunity was given to ask question and all questions were answered satisfactorily.  7/4 d/w patient's brother at bedside.  Disposition:  Pt is from Home, admitted with Fall, Rt Humeral Fracture, still has left arm sling and NW, dementia AAOx 1 unable to take of her self, which precludes a safe discharge. Discharge to SNF, when bed will be available.  Subjective: No overnight events, patient was sitting audibly on the recliner.  Denies any pain in the left arm.  Stated that she is feeling fine, no complaints.  Physical Exam: General:  NAD, lying comfortably Appear in no distress, affect appropriate Eyes: PERRLA ENT: Oral Mucosa Clear, moist  Neck: no JVD,  Cardiovascular: S1 and S2 Present, no Murmur,  Respiratory: good respiratory effort, Bilateral Air entry equal and  Decreased, no Crackles, no wheezes Abdomen: Bowel Sound present, Soft and no tenderness,  Skin: no rashes Extremities: no Pedal edema, no calf tenderness. Left Arm sling intact Neurologic: without any new focal findings Gait not checked due to patient safety concerns  Vitals:   12/10/22 1601 12/10/22 2011 12/11/22 0525 12/11/22 0752  BP: 118/72 (!) 159/78 117/66 (!) 153/76  Pulse: 79 78 68 82  Resp: 16   20  Temp: 98.6 F (37 C) 97.9 F (36.6 C) 98.1 F (36.7 C) 98 F (36.7 C)  TempSrc: Oral Oral Oral Oral  SpO2: 98% 100% 98% 99%  Weight:      Height:        Intake/Output Summary (Last 24 hours) at 12/11/2022 1643 Last data filed at 12/11/2022 1435 Gross per 24 hour  Intake 0 ml  Output --  Net 0 ml   Filed Weights   12/07/22 1214  Weight: 50.8 kg    Data Reviewed: I have personally reviewed and interpreted daily labs, tele strips, imagings as discussed above. I reviewed all nursing notes, pharmacy notes, vitals, pertinent old records I have discussed plan of care as described above with RN and patient/family.  CBC: Recent Labs  Lab 12/07/22 1300 12/08/22 0436 12/10/22 0603  WBC 11.9* 9.5 6.5  NEUTROABS 9.4*  --   --   HGB 11.8* 10.3* 9.9*  HCT 37.6 31.8* 31.2*  MCV 102.7* 100.0 102.0*  PLT 189 196 195   Basic Metabolic Panel: Recent Labs  Lab 12/07/22 1300 12/08/22 0436 12/10/22 0603  NA 137 140 137  K 4.1 3.8 3.8  CL 104 104 103  CO2 26 28 28   GLUCOSE 112* 100* 151*  BUN 21 24* 19  CREATININE 0.70 0.68 0.78  CALCIUM 8.9 8.9 8.7*  MG  --   --  2.2  PHOS  --   --  2.9    Studies: No results found.  Scheduled Meds:  atorvastatin  40 mg Oral QPM   cephALEXin  500 mg Oral Q12H   cholecalciferol  1,000 Units Oral Daily   enoxaparin (LOVENOX) injection  40 mg Subcutaneous Q24H   escitalopram  5 mg Oral Daily   feeding supplement  237 mL Oral BID BM   fluticasone furoate-vilanterol  1 puff Inhalation Daily   And   umeclidinium bromide  1 puff  Inhalation Daily   lidocaine  1 patch Transdermal Q24H   memantine  10 mg Oral BID   metoprolol succinate  25 mg Oral Daily   multivitamin with minerals  1 tablet Oral Daily   polyethylene glycol  17 g Oral Daily   roflumilast  250 mcg Oral Daily   tamsulosin  0.4 mg Oral QPC supper   Vitamin D (Ergocalciferol)  50,000 Units Oral Q7 days   Continuous Infusions:  lactated ringers     PRN Meds: acetaminophen, albuterol, bisacodyl, bisacodyl, dextromethorphan-guaiFENesin, fentaNYL (SUBLIMAZE) injection, hydrALAZINE, methocarbamol, ondansetron (ZOFRAN) IV, oxyCODONE-acetaminophen  Time spent: 35 minutes  Author: Gillis Santa. MD [facility] 12/11/2022 4:43 PM  To reach On-call, see care teams to locate the attending and reach out to them via [email or web address]. If 7PM-7AM, please contact night-coverage If you still have difficulty reaching the attending provider, please page the [facility] (Director on Call) for [facility]  on amion for assistance.

## 2022-12-11 NOTE — Progress Notes (Signed)
Occupational Therapy Treatment Patient Details Name: Bethany [name] MRN: [id number] DOB: 07-03-1935 Today's Date: 12/11/2022   History of present illness 87 y.o. female with medical history significant of COPD, dementia, anxiety, HTN, CAD, and HLD who presents to the [facility] ED (7/1) after being found down on floor by brother. Imaging revealed a minimally displaced proximal L humerus fracture. Per orthopedic surgery, pt to remain in shoulder immobilizer x4 wks, LUE NWBing.   OT comments  Bethany [name] was seen for OT treatment on this date. Upon arrival to room pt supine in bed, sling nearly doffed and poorly positioned for support. Pt agreeable to OT Tx session. OT facilitated sling/ADL management as described below. See ADL section for additional details regarding occupational performance. Pt continues to be functionally limited by decreased functional use of her LUE. Pt return verbalizes understanding of education provided t/o session. Pt is progressing toward OT goals and continues to benefit from skilled OT services to maximize return to PLOF and minimize risk of future falls, injury, caregiver burden, and readmission. Will continue to follow POC as written.     Recommendations for follow up therapy are one component of a multi-disciplinary discharge planning process, led by the attending physician.  Recommendations may be updated based on patient status, additional functional criteria and insurance authorization.    Assistance Recommended at Discharge Frequent or constant Supervision/Assistance  Patient can return home with the following  A little help with walking and/or transfers;A lot of help with bathing/dressing/bathroom;Assistance with cooking/housework;Assist for transportation;Help with stairs or ramp for entrance;Direct supervision/assist for medications management   Equipment Recommendations  None recommended by OT    Recommendations for Other Services      Precautions / Restrictions  Precautions Precautions: Shoulder;Fall Type of Shoulder Precautions: NWB Shoulder Interventions: Shoulder sling/immobilizer;At all times Required Braces or Orthoses: Sling Restrictions Weight Bearing Restrictions: Yes LUE Weight Bearing: Non weight bearing       Mobility Bed Mobility Overal bed mobility: Needs Assistance Bed Mobility: Supine to Sit     Supine to sit: Min assist     General bed mobility comments: Min A for trunk elevation when exiting toward R sided of bed.    Transfers Overall transfer level: Needs assistance Equipment used: None Transfers: Sit to/from Stand Sit to Stand: Supervision, Min guard           General transfer comment: HHA for stability with initial standing attempt, able to progress to supervision level during session.     Balance Overall balance assessment: Needs assistance Sitting-balance support: No upper extremity supported, Feet supported Sitting balance-Leahy Scale: Good Sitting balance - Comments: steady reaching within BOS   Standing balance support: Single extremity supported, During functional activity, Reliant on assistive device for balance Standing balance-Leahy Scale: Good                             ADL either performed or assessed with clinical judgement   ADL   Eating/Feeding: Sitting;Set up               Upper Body Dressing : Maximal assistance Upper Body Dressing Details (indicate cue type and reason): MAX A for sling, MAX A for gown using hemi dressing technique     Toilet Transfer: Min guard;Ambulation Toilet Transfer Details (indicate cue type and reason): +1 Simulated to room recliner         Functional mobility during ADLs: Min guard;Cueing for safety General ADL Comments: Pt  performs functional mobility in room with HHA, but no physical assist required. She is recieved with sling nearly off and poorly positioned. Requires MAX A to reposition and adjust sling. Decreased awareness of  precautions, requires cueing to remain NWB.    Extremity/Trunk Assessment              Vision Patient Visual Report: No change from baseline     Perception     Praxis      Cognition Arousal/Alertness: Awake/alert Behavior During Therapy: WFL for tasks assessed/performed Overall Cognitive Status: No family/caregiver present to determine baseline cognitive functioning Area of Impairment: Attention, Memory, Following commands, Safety/judgement, Awareness                   Current Attention Level: Sustained Memory: Decreased short-term memory, Decreased recall of precautions Following Commands: Follows one step commands with increased time Safety/Judgement: Decreased awareness of safety, Decreased awareness of deficits Awareness: Emergent            Exercises Other Exercises Other Exercises: Pt educated on proper positioning of sling, doffing/donning technique, LUE precuations, and hemi-dressing strategies. OT faciltiated ADL management with education and assistance as described above.    Shoulder Instructions       General Comments      Pertinent Vitals/ Pain       Pain Assessment Pain Assessment: Faces Faces Pain Scale: Hurts a little bit Pain Location: L shoulder with mobility Pain Descriptors / Indicators: Discomfort, Grimacing Pain Intervention(s): Limited activity within patient's tolerance, Repositioned, Monitored during session  Home Living                                          Prior Functioning/Environment              Frequency  Min 2X/week        Progress Toward Goals  OT Goals(current goals can now be found in the care plan section)  Progress towards OT goals: Progressing toward goals  Acute Rehab OT Goals Patient Stated Goal: To go home OT Goal Formulation: With patient Time For Goal Achievement: 12/22/22 Potential to Achieve Goals: Good  Plan Discharge plan remains appropriate;Frequency remains  appropriate    Co-evaluation                 AM-PAC OT "6 Clicks" Daily Activity     Outcome Measure   Help from another person eating meals?: A Little Help from another person taking care of personal grooming?: A Little Help from another person toileting, which includes using toliet, bedpan, or urinal?: A Little Help from another person bathing (including washing, rinsing, drying)?: A Lot Help from another person to put on and taking off regular upper body clothing?: A Lot Help from another person to put on and taking off regular lower body clothing?: A Little 6 Click Score: 16    End of Session Equipment Utilized During Treatment: Oxygen  OT Visit Diagnosis: Other abnormalities of gait and mobility (R26.89);History of falling (Z91.81);Pain;Muscle weakness (generalized) (M62.81) Pain - Right/Left: Left Pain - part of body: Shoulder   Activity Tolerance Patient tolerated treatment well   Patient Left in chair;with call bell/phone within reach;with chair alarm set   Nurse Communication Mobility status        Time: 1610-9604 OT Time Calculation (min): 21 min  Charges: OT General Charges $OT Visit: 1 Visit OT Treatments $Self Care/Home  Management : 8-22 mins  Rockney Ghee, M.S., OTR/L 12/11/22, 12:05 PM

## 2022-12-11 NOTE — Progress Notes (Signed)
   12/11/22 0045  Orthostatic Lying   BP- Lying 121/71  Pulse- Lying 72  Orthostatic Sitting  BP- Sitting 99/61  Pulse- Sitting 79  Orthostatic Standing at 0 minutes  BP- Standing at 0 minutes (!) 78/57  Pulse- Standing at 0 minutes 88  Orthostatic Standing at 3 minutes  BP- Standing at 3 minutes (!) 84/63  Pulse- Standing at 3 minutes 92    Patient denies symptoms. Patient has an order for orthostatic vital signs q shift.   RN notified Manuela Schwartz, NP of the above information.   Orders Received: LR fluid bolus.   RN notified NP the patient currently has no IV access. RN told NP we could place an IV. NP states to hold the bolus.

## 2022-12-12 ENCOUNTER — Encounter: Payer: Self-pay | Admitting: Student

## 2022-12-12 DIAGNOSIS — N3000 Acute cystitis without hematuria: Secondary | ICD-10-CM | POA: Diagnosis not present

## 2022-12-12 NOTE — Progress Notes (Signed)
Mobility Specialist - Progress Note   12/12/22 0858  Mobility  Activity Ambulated with assistance in room;Stood at bedside;Dangled on edge of bed;Transferred from bed to chair  Level of Assistance Contact guard assist, steadying assist  Assistive Device Front wheel walker  Distance Ambulated (ft) 25 ft  LUE Weight Bearing NWB  Activity Response Tolerated well  Mobility Referral Yes  $Mobility charge 1 Mobility  Mobility Specialist Start Time (ACUTE ONLY) 0831  Mobility Specialist Stop Time (ACUTE ONLY) [id number]  Mobility Specialist Time Calculation (min) (ACUTE ONLY) 21 min   Pt supine in bed on RA upon arrival. Pt completes bed mobility with HHA + extra time. Pt STS and ambulates to/from doorway CGA with no LOB noted. Pt left in recliner with needs in reach and chair alarm active.   Terrilyn Saver  Mobility Specialist  12/12/22 8:59 AM

## 2022-12-12 NOTE — TOC Progression Note (Signed)
Transition of Care [facility]) - Progression Note    Patient Details  Name: Bethany [name] MRN: [id number] Date of Birth: 1936-04-29  Transition of Care [facility]) CM/SW Contact  Bing Quarry, RN Phone Number: 12/12/2022, 3:21 PM  Clinical Narrative: Insurance authorization #[id number] via [employer] through 7/06/14/22. Resent CSW and therapy notes to [employer] via [employer].  Gabriel Cirri MSN RN CM  Transitions of Care Department [facility] [phone] Weekends Only     Expected Discharge Plan: Home/Self Care Barriers to Discharge: Continued Medical Work up  Expected Discharge Plan and Services                                               Social Determinants of Health (SDOH) Interventions SDOH Screenings   Food Insecurity: No Food Insecurity (12/07/2022)  Housing: Patient Declined (12/07/2022)  Transportation Needs: No Transportation Needs (12/07/2022)  Utilities: Not At Risk (12/07/2022)  Tobacco Use: Medium Risk (12/12/2022)    Readmission Risk Interventions     No data to display

## 2022-12-12 NOTE — Progress Notes (Signed)
[facility] Progress Note  Patient: Bethany [name]    WUJ:[id number]  DOA: 12/07/2022     Date of Service: the patient was seen and examined on 12/12/2022  Chief Complaint  Patient presents with   Fall   Brief hospital course: 87 y.o.f w/ Hypertension, hyperlipidemia, COPD on home oxygen and still smoking, CAD, memory loss,depression with anxiety, carotid artery stenosis who presents with accidental fall and left shoulder pain. In ED:WBC 11.9, positive urinalysis (clear appearance, moderate amount of leukocyte, positive nitrite, many bacteria, WBC 11-20), GFR> 60.  Temperature normal, blood pressure 150/64, heart rate 87, RR 16, oxygen saturation 100% on 3 L oxygen. Pt is placed on med-surg bed for obs. Dr. Joice Lofts of ortho was consulted.  Per ortho > given how well aligned the fracture is, this fracture can be managed nonsurgically in her shoulder immobilizer. The patient will be in this immobilizer for about 4 weeks before we can begin formal therapy. She is to avoid any weightbearing through the left hand or upper extremity before this time. Started on uti treatment and admitted   Assessment and Plan:  Principal Problem:   UTI (urinary tract infection) Active Problems:   Fall at home, initial encounter   Closed fracture of neck of left humerus   Chronic obstructive pulmonary disease (HCC)   HLD (hyperlipidemia)   HTN (hypertension)   Memory loss   Depression with anxiety   Protein-calorie malnutrition, severe (HCC)   UTI, Ucx Ecoli, pansensitive  S/p ceftriaxone, started Keflex x 5 days   Fall at home, initial encounter Closed fracture of neck of left humerus:  Seen by orthopedic Dr. Joice Lofts advised 4 weeks of immobilizer nonweightbearing on the left hand or upper extremity pain control/muscle relaxant/lidocaine.  Continue PT OT and follow-up with orthopedics outpatient  Given her mild cognitive impairment at risk, she lives alone and not able to use left hand will benefit with  skilled nursing facility    COPD  Chronic respiratory failure with hypoxia  Ongoing tobacco use : Continue her bronchodilators, supplemental oxygen Trelegy Ellipta, Daliresp.  We discussed about smoking cessation   HLD:  Cont Lipitor   Hypertension BP fairly controlled continue metoprolol and as needed  meds   Memory loss Mild cognitive impairment Depression with anxiety: Continued Namenda and Lexapro.  Vitamin D Insufficiency: started vitamin D 50,000 units p.o. weekly, follow with PCP to repeat vitamin D level after 3 to 6 months.   Severe malnutrition underweight BMI 17.3 dietitian consulted to augment nutritional status   Body mass index is 17.53 kg/m.  Nutrition Problem: Moderate Malnutrition Etiology: chronic illness (COPD) Interventions: Interventions: Ensure Enlive (each supplement provides 350kcal and 20 grams of protein), MVI, Liberalize Diet   Diet: Regular diet DVT Prophylaxis: Subcutaneous Lovenox   Advance goals of care discussion: DNR  Family Communication: family was present at bedside, at the time of interview.  The pt provided permission to discuss medical plan with the family. Opportunity was given to ask question and all questions were answered satisfactorily.  7/4 d/w patient's brother at bedside.  Disposition:  Pt is from Home, admitted with Fall, Rt Humeral Fracture, still has left arm sling and NW, dementia AAOx 1 unable to take of her self, which precludes a safe discharge. Discharge to SNF, when bed will be available.  Subjective: No overnight events, patient was sleeping comfortably, she woke up by calling her name and stated that her left arm is not hurting much.  Denies any complaints.   Physical  Exam: General: NAD, lying comfortably Appear in no distress, affect appropriate Eyes: PERRLA ENT: Oral Mucosa Clear, moist  Neck: no JVD,  Cardiovascular: S1 and S2 Present, no Murmur,  Respiratory: good respiratory effort, Bilateral Air  entry equal and Decreased, no Crackles, no wheezes Abdomen: Bowel Sound present, Soft and no tenderness,  Skin: no rashes Extremities: no Pedal edema, no calf tenderness. Left Arm sling intact Neurologic: without any new focal findings Gait not checked due to patient safety concerns  Vitals:   12/11/22 0752 12/11/22 1953 12/12/22 0453 12/12/22 0813  BP: (!) 153/76 (!) 153/72 131/67 122/65  Pulse: 82 79 76 72  Resp: 20 20 20 18   Temp: 98 F (36.7 C) 98.6 F (37 C) 98.6 F (37 C) 97.6 F (36.4 C)  TempSrc: Oral  Oral   SpO2: 99% 98% 99% 100%  Weight:      Height:        Intake/Output Summary (Last 24 hours) at 12/12/2022 1248 Last data filed at 12/12/2022 0600 Gross per 24 hour  Intake 600 ml  Output --  Net 600 ml   Filed Weights   12/07/22 1214  Weight: 50.8 kg    Data Reviewed: I have personally reviewed and interpreted daily labs, tele strips, imagings as discussed above. I reviewed all nursing notes, pharmacy notes, vitals, pertinent old records I have discussed plan of care as described above with RN and patient/family.  CBC: Recent Labs  Lab 12/07/22 1300 12/08/22 0436 12/10/22 0603  WBC 11.9* 9.5 6.5  NEUTROABS 9.4*  --   --   HGB 11.8* 10.3* 9.9*  HCT 37.6 31.8* 31.2*  MCV 102.7* 100.0 102.0*  PLT 189 196 195   Basic Metabolic Panel: Recent Labs  Lab 12/07/22 1300 12/08/22 0436 12/10/22 0603  NA 137 140 137  K 4.1 3.8 3.8  CL 104 104 103  CO2 26 28 28   GLUCOSE 112* 100* 151*  BUN 21 24* 19  CREATININE 0.70 0.68 0.78  CALCIUM 8.9 8.9 8.7*  MG  --   --  2.2  PHOS  --   --  2.9    Studies: No results found.  Scheduled Meds:  atorvastatin  40 mg Oral QPM   cephALEXin  500 mg Oral Q12H   cholecalciferol  1,000 Units Oral Daily   enoxaparin (LOVENOX) injection  40 mg Subcutaneous Q24H   escitalopram  5 mg Oral Daily   feeding supplement  237 mL Oral BID BM   fluticasone furoate-vilanterol  1 puff Inhalation Daily   And   umeclidinium  bromide  1 puff Inhalation Daily   lidocaine  1 patch Transdermal Q24H   memantine  10 mg Oral BID   metoprolol succinate  25 mg Oral Daily   multivitamin with minerals  1 tablet Oral Daily   polyethylene glycol  17 g Oral Daily   roflumilast  250 mcg Oral Daily   tamsulosin  0.4 mg Oral QPC supper   Vitamin D (Ergocalciferol)  50,000 Units Oral Q7 days   Continuous Infusions:  lactated ringers     PRN Meds: acetaminophen, albuterol, bisacodyl, bisacodyl, dextromethorphan-guaiFENesin, fentaNYL (SUBLIMAZE) injection, hydrALAZINE, methocarbamol, ondansetron (ZOFRAN) IV, oxyCODONE-acetaminophen  Time spent: 35 minutes  Author: Gillis Santa. MD [facility] 12/12/2022 12:48 PM  To reach On-call, see care teams to locate the attending and reach out to them via [email or web address]. If 7PM-7AM, please contact night-coverage If you still have difficulty reaching the attending provider, please page the [facility] (Director on Call) for  [facility] on amion for assistance.

## 2022-12-13 DIAGNOSIS — N3000 Acute cystitis without hematuria: Secondary | ICD-10-CM | POA: Diagnosis not present

## 2022-12-13 NOTE — Progress Notes (Signed)
Subjective: The patient continues to note moderate left shoulder pain with activity.  Based on her progress notes as well as comments from her son, who is at the bedside, the patient has not been keeping her shoulder immobilizer on at all times while as instructed, nor do the staff members appear to have a good understanding of its proper positioning.  Fortunately, she denies any reinjury to the shoulder, and denies any numbness or paresthesias down her arm to her hand.  Objective: Vital signs in last 24 hours: Temp:  [98 F (36.7 C)-98.5 F (36.9 C)] 98 F (36.7 C) (07/07 0745) Pulse Rate:  [69-74] 69 (07/07 0745) Resp:  [14-20] 14 (07/07 0745) BP: (125-140)/(62-74) 125/62 (07/07 0745) SpO2:  [95 %-100 %] 100 % (07/07 0745)  Intake/Output from previous day: 07/06 0701 - 07/07 0700 In: 240 [P.O.:240] Out: -  Intake/Output this shift: Total I/O In: 240 [P.O.:240] Out: -   No results for input(s): "HGB" in the last 72 hours. No results for input(s): "WBC", "RBC", "HCT", "PLT" in the last 72 hours. No results for input(s): "NA", "K", "CL", "CO2", "BUN", "CREATININE", "GLUCOSE", "CALCIUM" in the last 72 hours. No results for input(s): "LABPT", "INR" in the last 72 hours.  Physical Exam: Orthopedic examination again is limited to the left shoulder and upper extremity.  Moderate swelling persists over the anterolateral aspects of the upper arm/shoulder region.  Maturing ecchymosis also was noted.  She has mild-moderate tenderness to palpation over the anterior lateral aspects of the shoulder region.  She has more severe pain with any attempted active or passive motion of the shoulder.  She is grossly neurovascularly intact to the left upper extremity and hand.  Assessment: Mildly impacted left proximal humerus fracture.   Plan: The treatment options are reviewed with the patient and her son, who is at the bedside.  The shoulder immobilizer is readjusted to better support her arm.  The  patient will continue to be managed nonsurgically for her proximal humerus fracture.  She is to remain in her shoulder immobilizer at all times, removing only for bathing purposes.  I expect that she will need to be in this for another 3 weeks.  Thank you for asking me to participate in the care of this most pleasant and unfortunate woman.  I will sign off at this time.  Please make arrangements for the patient to follow-up my office in another 3 weeks to see one of my PAs and to obtain repeat x-rays.  Please reconsult me if further orthopedic input is needed during this hospital admission.   Excell Seltzer Sameka Bagent 12/13/2022, 5:31 PM

## 2022-12-13 NOTE — Progress Notes (Signed)
[facility] Progress Note  Patient: Bethany [name]    ZOX:[id number]  DOA: 12/07/2022     Date of Service: the patient was seen and examined on 12/13/2022  Chief Complaint  Patient presents with   Fall   Brief hospital course: 87 y.o.f w/ Hypertension, hyperlipidemia, COPD on home oxygen and still smoking, CAD, memory loss,depression with anxiety, carotid artery stenosis who presents with accidental fall and left shoulder pain. In ED:WBC 11.9, positive urinalysis (clear appearance, moderate amount of leukocyte, positive nitrite, many bacteria, WBC 11-20), GFR> 60.  Temperature normal, blood pressure 150/64, heart rate 87, RR 16, oxygen saturation 100% on 3 L oxygen. Pt is placed on med-surg bed for obs. Dr. Joice Lofts of ortho was consulted.  Per ortho > given how well aligned the fracture is, this fracture can be managed nonsurgically in her shoulder immobilizer. The patient will be in this immobilizer for about 4 weeks before we can begin formal therapy. She is to avoid any weightbearing through the left hand or upper extremity before this time. Started on uti treatment and admitted   Assessment and Plan:  Principal Problem:   UTI (urinary tract infection) Active Problems:   Fall at home, initial encounter   Closed fracture of neck of left humerus   Chronic obstructive pulmonary disease (HCC)   HLD (hyperlipidemia)   HTN (hypertension)   Memory loss   Depression with anxiety   Protein-calorie malnutrition, severe (HCC)   UTI, Ucx Ecoli, pansensitive  S/p ceftriaxone, started Keflex x 5 days   Fall at home, initial encounter Closed fracture of neck of left humerus:  Seen by orthopedic Dr. Joice Lofts advised 4 weeks of immobilizer nonweightbearing on the left hand or upper extremity pain control/muscle relaxant/lidocaine.  Continue PT OT and follow-up with orthopedics outpatient  Given her mild cognitive impairment at risk, she lives alone and not able to use left hand will benefit with  skilled nursing facility    COPD  Chronic respiratory failure with hypoxia  Ongoing tobacco use : Continue her bronchodilators, supplemental oxygen Trelegy Ellipta, Daliresp.  We discussed about smoking cessation   HLD:  Cont Lipitor   Hypertension BP fairly controlled continue metoprolol and as needed  meds   Memory loss Mild cognitive impairment Depression with anxiety: Continued Namenda and Lexapro.  Vitamin D Insufficiency: started vitamin D 50,000 units p.o. weekly, follow with PCP to repeat vitamin D level after 3 to 6 months.   Severe malnutrition underweight BMI 17.3 dietitian consulted to augment nutritional status   Body mass index is 17.53 kg/m.  Nutrition Problem: Moderate Malnutrition Etiology: chronic illness (COPD) Interventions: Interventions: Ensure Enlive (each supplement provides 350kcal and 20 grams of protein), MVI, Liberalize Diet   Diet: Regular diet DVT Prophylaxis: Subcutaneous Lovenox   Advance goals of care discussion: DNR  Family Communication: family was present at bedside, at the time of interview.  The pt provided permission to discuss medical plan with the family. Opportunity was given to ask question and all questions were answered satisfactorily.  7/4 d/w patient's brother at bedside.  Disposition:  Pt is from Home, admitted with Fall, Rt Humeral Fracture, still has left arm sling and NW, dementia AAOx 1 unable to take of her self, which precludes a safe discharge. Discharge to SNF, when bed will be available.  Most likely tomorrow a.m. as per TOC to [employer]  Subjective: No significant events overnight.  Patient was complaining of backache because of laying in the bed.  Patient was advised  to get up and sit on the chair and walk around with the help of RN.  Denies any chest pain or palpitation, no shortness of breath, no any other active issues.  Physical Exam: General: NAD, lying comfortably Appear in no distress, affect  appropriate Eyes: PERRLA ENT: Oral Mucosa Clear, moist  Neck: no JVD,  Cardiovascular: S1 and S2 Present, no Murmur,  Respiratory: good respiratory effort, Bilateral Air entry equal and Decreased, no Crackles, no wheezes Abdomen: Bowel Sound present, Soft and no tenderness,  Skin: no rashes Extremities: no Pedal edema, no calf tenderness. Left Arm sling intact Neurologic: without any new focal findings Gait not checked due to patient safety concerns  Vitals:   12/12/22 1737 12/12/22 1931 12/13/22 0420 12/13/22 0745  BP: 136/74 (!) 140/65 130/69 125/62  Pulse: 72 74 71 69  Resp: 18 18 20 14   Temp:  98.5 F (36.9 C) 98.2 F (36.8 C) 98 F (36.7 C)  TempSrc:   Oral Oral  SpO2: 96% 99% 95% 100%  Weight:      Height:        Intake/Output Summary (Last 24 hours) at 12/13/2022 1351 Last data filed at 12/13/2022 1018 Gross per 24 hour  Intake 480 ml  Output --  Net 480 ml   Filed Weights   12/07/22 1214  Weight: 50.8 kg    Data Reviewed: I have personally reviewed and interpreted daily labs, tele strips, imagings as discussed above. I reviewed all nursing notes, pharmacy notes, vitals, pertinent old records I have discussed plan of care as described above with RN and patient/family.  CBC: Recent Labs  Lab 12/07/22 1300 12/08/22 0436 12/10/22 0603  WBC 11.9* 9.5 6.5  NEUTROABS 9.4*  --   --   HGB 11.8* 10.3* 9.9*  HCT 37.6 31.8* 31.2*  MCV 102.7* 100.0 102.0*  PLT 189 196 195   Basic Metabolic Panel: Recent Labs  Lab 12/07/22 1300 12/08/22 0436 12/10/22 0603  NA 137 140 137  K 4.1 3.8 3.8  CL 104 104 103  CO2 26 28 28   GLUCOSE 112* 100* 151*  BUN 21 24* 19  CREATININE 0.70 0.68 0.78  CALCIUM 8.9 8.9 8.7*  MG  --   --  2.2  PHOS  --   --  2.9    Studies: No results found.  Scheduled Meds:  atorvastatin  40 mg Oral QPM   cephALEXin  500 mg Oral Q12H   cholecalciferol  1,000 Units Oral Daily   enoxaparin (LOVENOX) injection  40 mg Subcutaneous Q24H    escitalopram  5 mg Oral Daily   feeding supplement  237 mL Oral BID BM   fluticasone furoate-vilanterol  1 puff Inhalation Daily   And   umeclidinium bromide  1 puff Inhalation Daily   lidocaine  1 patch Transdermal Q24H   memantine  10 mg Oral BID   metoprolol succinate  25 mg Oral Daily   multivitamin with minerals  1 tablet Oral Daily   polyethylene glycol  17 g Oral Daily   roflumilast  250 mcg Oral Daily   tamsulosin  0.4 mg Oral QPC supper   Vitamin D (Ergocalciferol)  50,000 Units Oral Q7 days   Continuous Infusions:  lactated ringers     PRN Meds: acetaminophen, albuterol, bisacodyl, bisacodyl, dextromethorphan-guaiFENesin, fentaNYL (SUBLIMAZE) injection, hydrALAZINE, methocarbamol, ondansetron (ZOFRAN) IV, oxyCODONE-acetaminophen  Time spent: 35 minutes  Author: Gillis Santa. MD [facility] 12/13/2022 1:51 PM  To reach On-call, see care teams to locate the  attending and reach out to them via [email or web address]. If 7PM-7AM, please contact night-coverage If you still have difficulty reaching the attending provider, please page the [facility] (Director on Call) for [facility] on amion for assistance.

## 2022-12-13 NOTE — TOC Progression Note (Addendum)
Transition of Care [facility]) - Progression Note    Patient Details  Name: Bethany [name] MRN: [id number] Date of Birth: 1935-09-26  Transition of Care [facility]) CM/SW Contact  Chapman Fitch, RN Phone Number: 12/13/2022, 10:31 AM  Clinical Narrative:     Message sent to MD to determine when it is anticipated patient will medically be ready for discharge   Update:  per MD patient medically stable for discharge.  Voicemail let for Tiffany at [facility] to determine if they accept admission on the weekend    1150 - [facility] doesn't accept admissions over the weekend   Expected Discharge Plan: Home/Self Care Barriers to Discharge: Continued Medical Work up  Expected Discharge Plan and Services                                               Social Determinants of Health (SDOH) Interventions SDOH Screenings   Food Insecurity: No Food Insecurity (12/07/2022)  Housing: Patient Declined (12/07/2022)  Transportation Needs: No Transportation Needs (12/07/2022)  Utilities: Not At Risk (12/07/2022)  Tobacco Use: Medium Risk (12/12/2022)    Readmission Risk Interventions     No data to display

## 2022-12-13 NOTE — TOC Progression Note (Signed)
Transition of Care [facility]) - Progression Note    Patient Details  Name: Bethany [name] MRN: [id number] Date of Birth: 1935-10-12  Transition of Care [facility]) CM/SW Contact  Chapman Fitch, RN Phone Number: 12/13/2022, 12:08 PM  Clinical Narrative:     Confirmed with patient that she is aware [facility] is a non smoking facility    Expected Discharge Plan: Home/Self Care Barriers to Discharge: Continued Medical Work up  Expected Discharge Plan and Services                                               Social Determinants of Health (SDOH) Interventions SDOH Screenings   Food Insecurity: No Food Insecurity (12/07/2022)  Housing: Patient Declined (12/07/2022)  Transportation Needs: No Transportation Needs (12/07/2022)  Utilities: Not At Risk (12/07/2022)  Tobacco Use: Medium Risk (12/12/2022)    Readmission Risk Interventions     No data to display

## 2022-12-14 DIAGNOSIS — N3000 Acute cystitis without hematuria: Secondary | ICD-10-CM | POA: Diagnosis not present

## 2022-12-14 LAB — CBC
HCT: 32.8 % — ABNORMAL LOW (ref 36.0–46.0)
Hemoglobin: 10.4 g/dL — ABNORMAL LOW (ref 12.0–15.0)
MCH: 31.9 pg (ref 26.0–34.0)
MCHC: 31.7 g/dL (ref 30.0–36.0)
MCV: 100.6 fL — ABNORMAL HIGH (ref 80.0–100.0)
Platelets: 256 10*3/uL (ref 150–400)
RBC: 3.26 MIL/uL — ABNORMAL LOW (ref 3.87–5.11)
RDW: 12.6 % (ref 11.5–15.5)
WBC: 6.6 10*3/uL (ref 4.0–10.5)
nRBC: 0 % (ref 0.0–0.2)

## 2022-12-14 LAB — BASIC METABOLIC PANEL
Anion gap: 5 (ref 5–15)
BUN: 15 mg/dL (ref 8–23)
CO2: 31 mmol/L (ref 22–32)
Calcium: 9.2 mg/dL (ref 8.9–10.3)
Chloride: 103 mmol/L (ref 98–111)
Creatinine, Ser: 0.73 mg/dL (ref 0.44–1.00)
GFR, Estimated: >60 mL/min (ref >60)
Glucose, Bld: 91 mg/dL (ref 70–99)
Potassium: 4.1 mmol/L (ref 3.5–5.1)
Sodium: 139 mmol/L (ref 135–145)

## 2022-12-14 LAB — PHOSPHORUS: Phosphorus: 3.6 mg/dL (ref 2.5–4.6)

## 2022-12-14 LAB — MAGNESIUM: Magnesium: 2.5 mg/dL — ABNORMAL HIGH (ref 1.7–2.4)

## 2022-12-14 MED ORDER — HYDROCODONE-ACETAMINOPHEN 5-325 MG PO TABS: 1.0000 | ORAL_TABLET | Freq: Every day | ORAL | 0 refills | Status: AC | PRN

## 2022-12-14 MED ORDER — BISACODYL 5 MG PO TBEC: 10.0000 mg | DELAYED_RELEASE_TABLET | Freq: Every day | ORAL | 0 refills | Status: AC | PRN

## 2022-12-14 MED ORDER — VITAMIN D (ERGOCALCIFEROL) 1.25 MG (50000 UNIT) PO CAPS: 50000.0000 [IU] | ORAL_CAPSULE | ORAL | 0 refills | Status: AC

## 2022-12-14 MED ORDER — POLYETHYLENE GLYCOL 3350 17 G PO PACK: 17.0000 g | PACK | Freq: Every day | ORAL | 0 refills | Status: AC

## 2022-12-14 MED ORDER — ADULT MULTIVITAMIN W/MINERALS CH: 1.0000 | ORAL_TABLET | Freq: Every day | ORAL | Status: AC

## 2022-12-14 NOTE — Progress Notes (Signed)
Mobility Specialist - Progress Note   12/14/22 0924  Mobility  Activity Dangled on edge of bed;Turned to right side;Turned to left side;Turned to back - supine  $Mobility charge 1 Mobility  Mobility Specialist Start Time (ACUTE ONLY) 0919  Mobility Specialist Stop Time (ACUTE ONLY) [id number]  Mobility Specialist Time Calculation (min) (ACUTE ONLY) 4 min   Pt sitting EOB, transferring to supine upon entry. While MS set up room, PT enters the room and redirects the session. Pt left supine with PT at bedside.  Zetta Bills Mobility Specialist 12/14/22 9:28 AM

## 2022-12-14 NOTE — Progress Notes (Signed)
OT Cancellation Note  Patient Details Name: Bethany [name] MRN: [id number] DOB: 1935/09/16   Cancelled Treatment:    Reason Eval/Treat Not Completed: Other (comment) (Pt sitting EOB with sling donned; NT in room assisting with washing up prior to d/c. Pt declining OT tx at this time,her transport on the way.)  [employer] 12/14/2022, 11:48 AM

## 2022-12-14 NOTE — Progress Notes (Signed)
Physical Therapy Treatment Patient Details Name: Bethany [name] MRN: [id number] DOB: 17-Nov-1935 Today's Date: 12/14/2022   History of Present Illness 87 y.o. female with medical history significant of COPD, dementia, anxiety, HTN, CAD, and HLD who presents to the [facility] ED (7/1) after being found down on floor by brother. Imaging revealed a minimally displaced proximal L humerus fracture. Per orthopedic surgery, pt to remain in shoulder immobilizer x4 wks, LUE NWBing.    PT Comments  Pt resting in bed upon PT arrival; agreeable to therapy.  Pt c/o L shoulder pain but did not know why it was hurting; L UE sling adjusted for improved fit.  During session pt modified independent with bed mobility; SBA with transfers; and CGA to SBA ambulating 220 feet (no AD use--pt declining to use cane today).  Pt with very occasional altered stepping pattern while walking but no overt loss of balance noted during ambulation.  Pt declined stairs trial d/t fatigue.  Will continue to focus on strengthening, balance, and progressive functional mobility during hospitalization.    Assistance Recommended at Discharge Intermittent Supervision/Assistance  If plan is discharge home, recommend the following:  Can travel by private vehicle    A little help with walking and/or transfers;A little help with bathing/dressing/bathroom;Assistance with cooking/housework;Assist for transportation;Direct supervision/assist for medications management;Help with stairs or ramp for entrance      Equipment Recommendations  Cane    Recommendations for Other Services       Precautions / Restrictions Precautions Precautions: Shoulder;Fall Type of Shoulder Precautions: NWB Shoulder Interventions: Shoulder sling/immobilizer;At all times Required Braces or Orthoses: Sling Restrictions Weight Bearing Restrictions: Yes LUE Weight Bearing: Non weight bearing     Mobility  Bed Mobility Overal bed mobility: Modified Independent Bed  Mobility: Supine to Sit, Sit to Supine     Supine to sit: Modified independent (Device/Increase time), HOB elevated Sit to supine: Modified independent (Device/Increase time), HOB elevated   General bed mobility comments: mild increased effort to perform on own    Transfers Overall transfer level: Needs assistance Equipment used: None Transfers: Sit to/from Stand Sit to Stand: Supervision           General transfer comment: steady standing from bed    Ambulation/Gait Ambulation/Gait assistance: Min guard, Supervision Gait Distance (Feet): 220 Feet Assistive device: None Gait Pattern/deviations: Step-through pattern, Decreased step length - left, Decreased step length - right Gait velocity: decreased     General Gait Details: very occasional altered stepping pattern but no overt loss of balance noted   Stairs Stairs:  (pt declined stairs today d/t feeling tired)           Wheelchair Mobility     Tilt Bed    Modified Rankin (Stroke Patients Only)       Balance Overall balance assessment: Needs assistance Sitting-balance support: No upper extremity supported, Feet supported Sitting balance-Leahy Scale: Good Sitting balance - Comments: steady reaching within BOS   Standing balance support: No upper extremity supported, During functional activity Standing balance-Leahy Scale: Good Standing balance comment: very occasional altered stepping pattern but no overt loss of balance noted during ambulation                            Cognition Arousal/Alertness: Awake/alert Behavior During Therapy: WFL for tasks assessed/performed Overall Cognitive Status: No family/caregiver present to determine baseline cognitive functioning Area of Impairment: Attention, Memory, Following commands, Safety/judgement, Awareness, Orientation  Orientation Level: Disoriented to, Place, Situation Current Attention Level: Sustained Memory: Decreased  short-term memory, Decreased recall of precautions Following Commands: Follows one step commands with increased time Safety/Judgement: Decreased awareness of safety, Decreased awareness of deficits Awareness: Emergent            Exercises General Exercises - Lower Extremity Long Arc Quad: AROM, Strengthening, Both, 10 reps, Seated Hip Flexion/Marching: AROM, Strengthening, Both, 10 reps, Seated Other Exercises Other Exercises: x10 sit to stands from bed (no UE support)    General Comments  Nursing cleared pt for participation in physical therapy.  Pt agreeable to PT session.      Pertinent Vitals/Pain Pain Assessment Pain Assessment: Faces Faces Pain Scale: Hurts little more Pain Location: L shoulder Pain Descriptors / Indicators: Sore, Discomfort, Guarding Pain Intervention(s): Limited activity within patient's tolerance, Monitored during session Vitals (HR and SpO2 on 3 L via nasal cannula) stable and WFL throughout treatment session.    Home Living                          Prior Function            PT Goals (current goals can now be found in the care plan section) Acute Rehab PT Goals Patient Stated Goal: Walk more PT Goal Formulation: With patient Time For Goal Achievement: 12/21/22 Potential to Achieve Goals: Good Progress towards PT goals: Progressing toward goals    Frequency    Min 1X/week      PT Plan Frequency needs to be updated (Frequency adjusted based on updated pt centered delivery of car model therapy guidelines)    Co-evaluation              AM-PAC PT "6 Clicks" Mobility   Outcome Measure  Help needed turning from your back to your side while in a flat bed without using bedrails?: None Help needed moving from lying on your back to sitting on the side of a flat bed without using bedrails?: None Help needed moving to and from a bed to a chair (including a wheelchair)?: A Little Help needed standing up from a chair using your  arms (e.g., wheelchair or bedside chair)?: A Little Help needed to walk in hospital room?: A Little Help needed climbing 3-5 steps with a railing? : A Little 6 Click Score: 20    End of Session Equipment Utilized During Treatment: Gait belt;Oxygen (3 L via nasal cannula) Activity Tolerance: Patient limited by fatigue Patient left: in bed;with call bell/phone within reach;with bed alarm set Nurse Communication: Mobility status;Precautions PT Visit Diagnosis: History of falling (Z91.81);Pain;Difficulty in walking, not elsewhere classified (R26.2) Pain - Right/Left: Left Pain - part of body: Arm;Shoulder     Time: 1610-9604 PT Time Calculation (min) (ACUTE ONLY): 18 min  Charges:    $Therapeutic Activity: 8-22 mins PT General Charges $$ ACUTE PT VISIT: 1 Visit                     Hendricks Limes, PT 12/14/22, 12:07 PM

## 2022-12-14 NOTE — TOC Transition Note (Signed)
Transition of Care [facility]) - CM/SW Discharge Note   Patient Details  Name: Bethany [name] MRN: [id number] Date of Birth: May 30, 1936  [facility] CM/SW Contact:  Allena Katz, LCSW Phone Number: 12/14/2022, 11:23 AM   Clinical Narrative:   Pt to discharge to [facility] today. DC summary sent. Pt going to room 706. RN given number for report. Son to transport around 12 or 12:30pm.     Final next level of care: Skilled Nursing Facility Barriers to Discharge: Barriers Resolved   Patient Goals and CMS Choice CMS Medicare.gov Compare Post Acute Care list provided to:: Patient Choice offered to / list presented to : Patient  Discharge Placement PASRR number recieved: 12/09/22 PASRR number recieved: 12/09/22            Patient chooses bed at: [facility] Patient to be transferred to facility by: brother Name of family member notified: brother clinton Patient and family notified of of transfer: 12/14/22  Discharge Plan and Services Additional resources added to the After Visit Summary for                                       Social Determinants of Health (SDOH) Interventions SDOH Screenings   Food Insecurity: No Food Insecurity (12/07/2022)  Housing: Patient Declined (12/07/2022)  Transportation Needs: No Transportation Needs (12/07/2022)  Utilities: Not At Risk (12/07/2022)  Tobacco Use: Medium Risk (12/12/2022)     Readmission Risk Interventions     No data to display

## 2022-12-14 NOTE — Care Management Important Message (Signed)
Important Message  Patient Details  Name: Bethany [name] MRN: [id number] Date of Birth: 26-Nov-1935   Medicare Important Message Given:  Yes     Olegario Messier A Roberts Bon 12/14/2022, 10:29 AM

## 2022-12-14 NOTE — Discharge Summary (Addendum)
[facility] Discharge Summary   Patient: Bethany [name] ZOX:[id number]  PCP: Barbette Reichmann, MD  Date of admission: 12/07/2022   Date of discharge: 12/14/2022     Discharge Diagnoses:  Principal Problem:   UTI (urinary tract infection) Active Problems:   Fall at home, initial encounter   Closed fracture of neck of left humerus   Chronic obstructive pulmonary disease (HCC)   HLD (hyperlipidemia)   HTN (hypertension)   Memory loss   Depression with anxiety   Protein-calorie malnutrition, moderate (HCC)   Closed left humeral fracture   Admitted From: Home Disposition:  SNF   Recommendations for Outpatient Follow-up:  PCP: in 1-2 days F/u Ortho in 3 wks Follow up LABS/TEST:  Left Humerus xray    Follow-up Information     Barbette Reichmann, MD Follow up in 1 week(s).   Specialty: Internal Medicine Contact information: [address] [place] [address] [phone]         Christena Flake, MD Follow up in 3 week(s).   Specialty: Orthopedic Surgery Contact information: [address] [facility] [place] [address] [phone]                Diet recommendation: Regular diet  Activity: The patient is advised to gradually reintroduce usual activities, as tolerated  Discharge Condition: stable  Code Status: DNR   History of present illness: As per the H and P dictated on admission Hospital Course:  87 y.o.f w/ Hypertension, hyperlipidemia, COPD on home oxygen and still smoking, CAD, memory loss,depression with anxiety, carotid artery stenosis who presents with accidental fall and left shoulder pain. In ED:WBC 11.9, positive urinalysis (clear appearance, moderate amount of leukocyte, positive nitrite, many bacteria, WBC 11-20), GFR> 60.  Temperature normal, blood pressure 150/64, heart rate 87, RR 16, oxygen saturation 100% on 3 L oxygen. Pt is placed on med-surg bed for obs. Dr. Joice Lofts of ortho was  consulted.  Per ortho > given how well aligned the fracture is, this fracture can be managed nonsurgically in her shoulder immobilizer. The patient will be in this immobilizer for about 4 weeks before we can begin formal therapy. She is to avoid any weightbearing through the left hand or upper extremity before this time. Started on uti treatment and admitted     Assessment and Plan:    # Fall at home, initial encounter. Closed fracture of neck of left humerus:  Seen by orthopedic Dr. Joice Lofts advised 4 weeks of immobilizer nonweightbearing on the left hand or upper extremity pain control/muscle relaxant/lidocaine.  Continue PT OT and follow-up with orthopedics outpatient. Given her mild cognitive impairment at risk, she lives alone and not able to use left hand will benefit with skilled nursing facility  # UTI, Ucx Ecoli, pansensitive. S/p ceftriaxone, completed Keflex x 5 days # COPD, Chronic respiratory failure with hypoxia  Ongoing tobacco use. Continue her bronchodilators, supplemental oxygen Trelegy Ellipta, Daliresp.  We discussed about smoking cessation # HLD: Cont Lipitor # Hypertension: BP fairly controlled continue metoprolol. # Memory loss, Mild cognitive impairment. Depression with anxiety: Continued Namenda and Lexapro. # Vitamin D Insufficiency: started vitamin D 50,000 units p.o. weekly, follow with PCP to repeat vitamin D level after 3 to 6 months. # Moderate malnutrition underweight BMI 17.3 dietitian consulted to augment nutritional status Body mass index is 17.53 kg/m.  Nutrition Problem: Moderate Malnutrition Etiology: chronic illness (COPD) Nutrition Interventions: Interventions: Ensure Enlive (each supplement provides 350kcal and 20 grams of protein),  MVI, Liberalize Diet   Pain control  - [place] Controlled Substance Reporting System database was reviewed. - 10 tabs prescribed  - Patient was instructed, not to drive, operate heavy machinery, perform activities  at heights, swimming or participation in water activities or provide baby sitting services while on Pain, Sleep and Anxiety Medications; until her outpatient Physician has advised to do so again.  - Also recommended to not to take more than prescribed Pain, Sleep and Anxiety Medications.  Patient was seen by physical therapy, who recommended Therapy, SNF, which was arranged. On the day of the discharge the patient's vitals were stable, and no other acute medical condition were reported by patient. the patient was felt safe to be discharge at [facility].  Consultants: Ortho Procedures: None  Discharge Exam: General: Appear in no distress, no Rash; Oral Mucosa Clear, moist. Cardiovascular: S1 and S2 Present, no Murmur, Respiratory: normal respiratory effort, Bilateral Air entry present and no Crackles, no wheezes Abdomen: Bowel Sound present, Soft and no tenderness, no hernia Extremities: no Pedal edema, no calf tenderness Neurology: alert and oriented to time, place, and person affect appropriate.  Filed Weights   12/07/22 1214  Weight: 50.8 kg   Vitals:   12/14/22 0501 12/14/22 0749  BP: (!) 142/77 131/77  Pulse: 70 68  Resp: 18 16  Temp: 98.1 F (36.7 C) 98 F (36.7 C)  SpO2: 100% 99%    DISCHARGE MEDICATION: Allergies as of 12/14/2022       Reactions   Penicillins Other (See Comments)   Patient unsure   Sulfa Antibiotics Itching   Codeine Itching, Nausea Only        Medication List     STOP taking these medications    docusate sodium 100 MG capsule Commonly known as: COLACE   gabapentin 300 MG capsule Commonly known as: NEURONTIN   tamsulosin 0.4 MG Caps capsule Commonly known as: FLOMAX   Vitamin D3 25 MCG (1000 UT) Caps       TAKE these medications    albuterol 108 (90 Base) MCG/ACT inhaler Commonly known as: VENTOLIN HFA Inhale 2 puffs into the lungs every 6 (six) hours as needed for wheezing or shortness of breath.   atorvastatin 40 MG  tablet Commonly known as: LIPITOR Take 40 mg by mouth daily.   bisacodyl 5 MG EC tablet Commonly known as: DULCOLAX Take 2 tablets (10 mg total) by mouth daily as needed for moderate constipation.   Daliresp 250 MCG Tabs Generic drug: Roflumilast Take 250 mcg by mouth daily.   escitalopram 5 MG tablet Commonly known as: LEXAPRO Take 5 mg by mouth daily.   HYDROcodone-acetaminophen 5-325 MG tablet Commonly known as: NORCO/VICODIN Take 1 tablet by mouth 5 (five) times daily as needed for moderate pain. Do not exceed 5 times daily, patient at least 3 day   memantine 10 MG tablet Commonly known as: NAMENDA Take 10 mg by mouth 2 (two) times daily.   metoprolol succinate 25 MG 24 hr tablet Commonly known as: TOPROL-XL Take 25 mg by mouth daily.   multivitamin with minerals Tabs tablet Take 1 tablet by mouth daily.   polyethylene glycol 17 g packet Commonly known as: MIRALAX / GLYCOLAX Take 17 g by mouth daily.   Trelegy Ellipta 100-62.5-25 MCG/ACT Aepb Generic drug: Fluticasone-Umeclidin-Vilant Inhale 1 puff into the lungs daily.   Vitamin D (Ergocalciferol) 1.25 MG (50000 UNIT) Caps capsule Commonly known as: DRISDOL Take 1 capsule (50,000 Units total) by mouth every 7 (seven) days.  Start taking on: December 17, 2022       Allergies  Allergen Reactions   Penicillins Other (See Comments)    Patient unsure   Sulfa Antibiotics Itching   Codeine Itching and Nausea Only   Discharge Instructions     Call MD for:  difficulty breathing, headache or visual disturbances   Complete by: As directed    Call MD for:  extreme fatigue   Complete by: As directed    Call MD for:  persistant dizziness or light-headedness   Complete by: As directed    Call MD for:  persistant nausea and vomiting   Complete by: As directed    Call MD for:  severe uncontrolled pain   Complete by: As directed    Call MD for:  temperature >100.4   Complete by: As directed    Diet - low sodium heart  healthy   Complete by: As directed    Discharge instructions   Complete by: As directed    PCP, need to be seen by an MD in 1-2 days ar SNF F/u ortho in 3 weeks   Increase activity slowly   Complete by: As directed        The results of significant diagnostics from this hospitalization (including imaging, microbiology, ancillary and laboratory) are listed below for reference.    Significant Diagnostic Studies: DG Pelvis 1-2 Views  Result Date: 12/07/2022 CLINICAL DATA:  Pain after fall EXAM: PELVIS - 1 VIEW COMPARISON:  None Available. FINDINGS: Osteopenia. Chronic appearing deformity of the left iliac crest laterally with hypertrophic changes and deformity. Overall preserved hip joint spaces. Degenerative changes of the sacroiliac joints. No acute fracture or dislocation. Prominent vascular calcifications identified with vascular stents in the pelvis. Fixation hardware along the lumbar spine. Overall with this level of osteopenia a subtle nondisplaced injury is difficult to completely exclude and if needed additional workup as clinically directed with CT or MRI IMPRESSION: Severe osteopenia with chronic changes.  Degenerative changes. Electronically Signed   By: Karen Kays M.D.   On: 12/07/2022 14:02   DG Chest 2 View  Result Date: 12/07/2022 CLINICAL DATA:  Pain after fall EXAM: CHEST - 2 VIEW COMPARISON:  None Available. FINDINGS: Hyperinflation. No consolidation, pneumothorax or effusion. Normal cardiopericardial silhouette without edema. Calcified tortuous aorta. Osteopenia. Old right-sided rib fractures. Degenerative changes of the spine. Lateral view is limited as the left arm was at the patient's side but there is a left proximal humeral fracture. Please see separate humerus x-rays IMPRESSION: Hyperinflation.  No acute cardiopulmonary disease. Electronically Signed   By: Karen Kays M.D.   On: 12/07/2022 14:00   DG Humerus Left  Result Date: 12/07/2022 CLINICAL DATA:  Pain after fall  EXAM: LEFT HUMERUS - 2 VIEW COMPARISON:  None Available. FINDINGS: There is a comminuted fracture of the left humeral neck. Mild displacement. No additional fracture or dislocation. Osteopenia. Degenerative changes seen of the [facility] joint. There is some widening of the space between the humeral head in the acromion. Possible joint effusion. IMPRESSION: Osteopenia. Comminuted mildly displaced fracture of the humeral neck. Degenerative changes of the [facility] joint Electronically Signed   By: Karen Kays M.D.   On: 12/07/2022 14:00   CT HEAD WO CONTRAST ( )  Result Date: 12/07/2022 CLINICAL DATA:  Head trauma, moderate-severe; Neck trauma (Age >= 65y) EXAM: CT HEAD WITHOUT CONTRAST CT CERVICAL SPINE WITHOUT CONTRAST TECHNIQUE: Multidetector CT imaging of the head and cervical spine was performed following the standard protocol without  intravenous contrast. Multiplanar CT image reconstructions of the cervical spine were also generated. RADIATION DOSE REDUCTION: This exam was performed according to the departmental dose-optimization program which includes automated exposure control, adjustment of the mA and/or kV according to patient size and/or use of iterative reconstruction technique. COMPARISON:  CT Head 09/16/20 FINDINGS: CT HEAD FINDINGS Brain: No evidence of acute infarction, hemorrhage, hydrocephalus, extra-axial collection or mass lesion/mass effect. There is sequela of moderate chronic microvascular ischemic change. Vascular: No hyperdense vessel or unexpected calcification. Skull: Normal. Negative for fracture or focal lesion. Sinuses/Orbits: Trace right mastoid effusion. No middle ear effusion. Paranasal sinuses are clear. Bilateral lens replacement. Orbits are otherwise unremarkable. Other: None. CT CERVICAL SPINE FINDINGS Alignment: Straightening of the normal cervical lordosis. Grade 1 anterolisthesis of C3 on C4. Trace retrolisthesis of C5 on C6. Skull base and vertebrae: No acute fracture. No primary bone  lesion or focal pathologic process. Soft tissues and spinal canal: No prevertebral fluid or swelling. No visible canal hematoma. Severe atherosclerotic plaque of the carotid bifurcation bilaterally. Disc levels: Evidence of high-grade spinal canal stenosis. Mild multilevel degenerative changes throughout the cervical spine. Upper chest: Moderate centrilobular emphysema. Other: None IMPRESSION: 1. No acute intracranial abnormality. 2. No acute fracture or traumatic malalignment of the cervical spine. 3. Severe atherosclerotic plaque of the carotid bifurcation bilaterally. Consider further evaluation with carotid ultrasound. 4. Moderate centrilobular emphysema. Emphysema (ICD10-J43.9). Electronically Signed   By: Lorenza Cambridge M.D.   On: 12/07/2022 13:59   CT Cervical Spine Wo Contrast  Result Date: 12/07/2022 CLINICAL DATA:  Head trauma, moderate-severe; Neck trauma (Age >= 65y) EXAM: CT HEAD WITHOUT CONTRAST CT CERVICAL SPINE WITHOUT CONTRAST TECHNIQUE: Multidetector CT imaging of the head and cervical spine was performed following the standard protocol without intravenous contrast. Multiplanar CT image reconstructions of the cervical spine were also generated. RADIATION DOSE REDUCTION: This exam was performed according to the departmental dose-optimization program which includes automated exposure control, adjustment of the mA and/or kV according to patient size and/or use of iterative reconstruction technique. COMPARISON:  CT Head 09/16/20 FINDINGS: CT HEAD FINDINGS Brain: No evidence of acute infarction, hemorrhage, hydrocephalus, extra-axial collection or mass lesion/mass effect. There is sequela of moderate chronic microvascular ischemic change. Vascular: No hyperdense vessel or unexpected calcification. Skull: Normal. Negative for fracture or focal lesion. Sinuses/Orbits: Trace right mastoid effusion. No middle ear effusion. Paranasal sinuses are clear. Bilateral lens replacement. Orbits are otherwise  unremarkable. Other: None. CT CERVICAL SPINE FINDINGS Alignment: Straightening of the normal cervical lordosis. Grade 1 anterolisthesis of C3 on C4. Trace retrolisthesis of C5 on C6. Skull base and vertebrae: No acute fracture. No primary bone lesion or focal pathologic process. Soft tissues and spinal canal: No prevertebral fluid or swelling. No visible canal hematoma. Severe atherosclerotic plaque of the carotid bifurcation bilaterally. Disc levels: Evidence of high-grade spinal canal stenosis. Mild multilevel degenerative changes throughout the cervical spine. Upper chest: Moderate centrilobular emphysema. Other: None IMPRESSION: 1. No acute intracranial abnormality. 2. No acute fracture or traumatic malalignment of the cervical spine. 3. Severe atherosclerotic plaque of the carotid bifurcation bilaterally. Consider further evaluation with carotid ultrasound. 4. Moderate centrilobular emphysema. Emphysema (ICD10-J43.9). Electronically Signed   By: Lorenza Cambridge M.D.   On: 12/07/2022 13:59    Microbiology: Recent Results (from the past 240 hour(s))  Urine Culture     Status: Abnormal   Collection Time: 12/07/22  3:10 PM   Specimen: Urine, Clean Catch  Result Value Ref Range Status   Specimen Description  Final    URINE, CLEAN CATCH Performed at [facility], [address] [place], [place] [address]    Special Requests   Final    NONE Performed at [facility], [address] [place], [place] [address]    Culture >=100,000 COLONIES/mL ESCHERICHIA COLI (A)  Final   Report Status 12/09/2022 FINAL  Final   Organism ID, Bacteria ESCHERICHIA COLI (A)  Final      Susceptibility   Escherichia coli - MIC*    AMPICILLIN <=2 SENSITIVE Sensitive     CEFAZOLIN <=4 SENSITIVE Sensitive     CEFEPIME <=0.12 SENSITIVE Sensitive     CEFTRIAXONE <=0.25 SENSITIVE Sensitive     CIPROFLOXACIN <=0.25 SENSITIVE Sensitive     GENTAMICIN <=1 SENSITIVE Sensitive     IMIPENEM <=0.25 SENSITIVE  Sensitive     NITROFURANTOIN <=16 SENSITIVE Sensitive     TRIMETH/SULFA <=20 SENSITIVE Sensitive     AMPICILLIN/SULBACTAM <=2 SENSITIVE Sensitive     PIP/TAZO <=4 SENSITIVE Sensitive     * >=100,000 COLONIES/mL ESCHERICHIA COLI     Labs: CBC: Recent Labs  Lab 12/07/22 1300 12/08/22 0436 12/10/22 0603 12/14/22 0530  WBC 11.9* 9.5 6.5 6.6  NEUTROABS 9.4*  --   --   --   HGB 11.8* 10.3* 9.9* 10.4*  HCT 37.6 31.8* 31.2* 32.8*  MCV 102.7* 100.0 102.0* 100.6*  PLT 189 196 195 256   Basic Metabolic Panel: Recent Labs  Lab 12/07/22 1300 12/08/22 0436 12/10/22 0603 12/14/22 0530  NA 137 140 137 139  K 4.1 3.8 3.8 4.1  CL 104 104 103 103  CO2 26 28 28 31   GLUCOSE 112* 100* 151* 91  BUN 21 24* 19 15  CREATININE 0.70 0.68 0.78 0.73  CALCIUM 8.9 8.9 8.7* 9.2  MG  --   --  2.2 2.5*  PHOS  --   --  2.9 3.6   Liver Function Tests: Recent Labs  Lab 12/07/22 1300  AST 19  ALT 11  ALKPHOS 49  BILITOT 1.2  PROT 6.1*  ALBUMIN 3.4*   No results for input(s): "LIPASE", "AMYLASE" in the last 168 hours. No results for input(s): "AMMONIA" in the last 168 hours. Cardiac Enzymes: No results for input(s): "CKTOTAL", "CKMB", "CKMBINDEX", "TROPONINI" in the last 168 hours. BNP (last 3 results) No results for input(s): "BNP" in the last 8760 hours. CBG: No results for input(s): "GLUCAP" in the last 168 hours.  Time spent: 35 minutes  Signed:  Gillis Santa  [facility] 12/14/2022 10:11 AM
